# Patient Record
Sex: Male | Born: 1985 | Race: White | Hispanic: No | Marital: Single | Smoking: Current every day smoker
Health system: Southern US, Community
[De-identification: ages and names within clinical notes are randomized; demographics above are authoritative.]

## PROBLEM LIST (undated history)

## (undated) DIAGNOSIS — G629 Polyneuropathy, unspecified: Secondary | ICD-10-CM

## (undated) DIAGNOSIS — K219 Gastro-esophageal reflux disease without esophagitis: Secondary | ICD-10-CM

## (undated) DIAGNOSIS — C801 Malignant (primary) neoplasm, unspecified: Secondary | ICD-10-CM

## (undated) DIAGNOSIS — F419 Anxiety disorder, unspecified: Secondary | ICD-10-CM

## (undated) DIAGNOSIS — F319 Bipolar disorder, unspecified: Secondary | ICD-10-CM

## (undated) HISTORY — PX: WISDOM TOOTH EXTRACTION: SHX21

---

## 2020-08-15 ENCOUNTER — Encounter (HOSPITAL_COMMUNITY): Payer: Self-pay | Admitting: Emergency Medicine

## 2020-08-15 ENCOUNTER — Emergency Department (HOSPITAL_COMMUNITY)
Admission: EM | Admit: 2020-08-15 | Discharge: 2020-08-15 | Discharge status: Against Medical Advice | Payer: Self-pay | Attending: Emergency Medicine | Admitting: Emergency Medicine

## 2020-08-15 ENCOUNTER — Emergency Department (HOSPITAL_COMMUNITY): Payer: Self-pay

## 2020-08-15 ENCOUNTER — Other Ambulatory Visit: Payer: Self-pay

## 2020-08-15 DIAGNOSIS — J982 Interstitial emphysema: Secondary | ICD-10-CM | POA: Insufficient documentation

## 2020-08-15 LAB — CBC WITH DIFFERENTIAL/PLATELET
Abs Immature Granulocytes: 0.04 10*3/uL (ref 0.00–0.07)
Basophils Absolute: 0 10*3/uL (ref 0.0–0.1)
Basophils Relative: 0 %
Eosinophils Absolute: 0 10*3/uL (ref 0.0–0.5)
Eosinophils Relative: 0 %
HCT: 39 % (ref 39.0–52.0)
Hemoglobin: 13.2 g/dL (ref 13.0–17.0)
Immature Granulocytes: 0 %
Lymphocytes Relative: 17 %
Lymphs Abs: 1.8 10*3/uL (ref 0.7–4.0)
MCH: 30.2 pg (ref 26.0–34.0)
MCHC: 33.8 g/dL (ref 30.0–36.0)
MCV: 89.2 fL (ref 80.0–100.0)
Monocytes Absolute: 0.5 10*3/uL (ref 0.1–1.0)
Monocytes Relative: 5 %
Neutro Abs: 8.3 10*3/uL — ABNORMAL HIGH (ref 1.7–7.7)
Neutrophils Relative %: 78 %
Platelets: 255 10*3/uL (ref 150–400)
RBC: 4.37 MIL/uL (ref 4.22–5.81)
RDW: 12 % (ref 11.5–15.5)
WBC: 10.7 10*3/uL — ABNORMAL HIGH (ref 4.0–10.5)
nRBC: 0 % (ref 0.0–0.2)

## 2020-08-15 LAB — BASIC METABOLIC PANEL
Anion gap: 12 (ref 5–15)
BUN: 10 mg/dL (ref 6–20)
CO2: 23 mmol/L (ref 22–32)
Calcium: 9.7 mg/dL (ref 8.9–10.3)
Chloride: 102 mmol/L (ref 98–111)
Creatinine, Ser: 1 mg/dL (ref 0.61–1.24)
GFR, Estimated: >60 mL/min (ref >60)
Glucose, Bld: 111 mg/dL — ABNORMAL HIGH (ref 70–99)
Potassium: 3.4 mmol/L — ABNORMAL LOW (ref 3.5–5.1)
Sodium: 137 mmol/L (ref 135–145)

## 2020-08-15 LAB — D-DIMER, QUANTITATIVE: D-Dimer, Quant: 0.75 ug/mL-FEU — ABNORMAL HIGH (ref 0.00–0.50)

## 2020-08-15 LAB — TROPONIN I (HIGH SENSITIVITY)
Troponin I (High Sensitivity): 4 ng/L (ref <18)
Troponin I (High Sensitivity): 4 ng/L (ref <18)

## 2020-08-15 MED ORDER — ACETAMINOPHEN 325 MG PO TABS
650.0000 mg | ORAL_TABLET | Freq: Once | ORAL | Status: AC
  Administered 2020-08-15: 650 mg via ORAL
  Filled 2020-08-15: qty 2

## 2020-08-15 MED ORDER — METHOCARBAMOL 500 MG PO TABS
500.0000 mg | ORAL_TABLET | Freq: Once | ORAL | Status: AC
  Administered 2020-08-15: 500 mg via ORAL
  Filled 2020-08-15: qty 1

## 2020-08-15 MED ORDER — ALBUTEROL SULFATE HFA 108 (90 BASE) MCG/ACT IN AERS
1.0000 | INHALATION_SPRAY | Freq: Once | RESPIRATORY_TRACT | Status: AC
  Administered 2020-08-15: 1 via RESPIRATORY_TRACT
  Filled 2020-08-15: qty 6.7

## 2020-08-15 MED ORDER — IOHEXOL 350 MG/ML SOLN
75.0000 mL | Freq: Once | INTRAVENOUS | Status: AC | PRN
  Administered 2020-08-15: 75 mL via INTRAVENOUS

## 2020-08-15 NOTE — ED Notes (Signed)
ED Provider at bedside. 

## 2020-08-15 NOTE — ED Provider Notes (Signed)
[facility] EMERGENCY DEPARTMENT Provider Note   CSN: [id number] Arrival date & time: 08/15/20  1235     History Chief Complaint  Patient presents with   Chest Pain    Jeremiah Allen is a 35 y.o. male presenting to the ED with a chief complaint of chest pain.  He has had intermittent chest pain for 1 week that got worse today.  Reports pleuritic chest pain and shortness of breath as well.  Feels that he cannot take in a deep breath fully.  The shortness of breath began today and the chest pain got worse as well.  He also states that the chest pain is worse with movement.  Denies any abdominal pain, nausea, vomiting, fever, leg swelling.  Went on a long drive this past weekend.  Denies any history of DVT, PE, MI, recent immobilization.  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  Last time he used this was in June 2022.  HPI     History reviewed. No pertinent past medical history.  There are no problems to display for this patient.        No family history on file.     Home Medications Prior to Admission medications   Not on File    Allergies    Patient has no known allergies.  Review of Systems   Review of Systems  Constitutional:  Negative for appetite change, chills and fever.  HENT:  Negative for ear pain, rhinorrhea, sneezing and sore throat.   Eyes:  Negative for photophobia and visual disturbance.  Respiratory:  Positive for shortness of breath. Negative for cough, chest tightness and wheezing.   Cardiovascular:  Positive for chest pain. Negative for palpitations.  Gastrointestinal:  Negative for abdominal pain, blood in stool, constipation, diarrhea, nausea and vomiting.  Genitourinary:  Negative for dysuria, hematuria and urgency.  Musculoskeletal:  Negative for myalgias.  Skin:  Negative for rash.  Neurological:  Negative for dizziness, weakness and light-headedness.   Physical Exam Updated Vital Signs BP 132/76    Pulse 73   Temp 98.8 F (37.1 C) (Oral)   Resp 15   Ht 5\' 10"  (1.778 m)   Wt 65.8 kg   SpO2 99%   BMI 20.81 kg/m   Physical Exam Vitals and nursing note reviewed.  Constitutional:      General: He is not in acute distress.    Appearance: He is well-developed.  HENT:     Head: Normocephalic and atraumatic.     Nose: Nose normal.  Eyes:     General: No scleral icterus.       Left eye: No discharge.     Conjunctiva/sclera: Conjunctivae normal.  Cardiovascular:     Rate and Rhythm: Normal rate and regular rhythm.     Heart sounds: Normal heart sounds. No murmur heard.   No friction rub. No gallop.  Pulmonary:     Effort: Pulmonary effort is normal. No respiratory distress.     Breath sounds: Normal breath sounds.  Abdominal:     General: Bowel sounds are normal. There is no distension.     Palpations: Abdomen is soft.     Tenderness: There is no abdominal tenderness. There is no guarding.  Musculoskeletal:        General: Normal range of motion.     Cervical back: Normal range of motion and neck supple.     Comments: No lower extremity edema, erythema or calf tenderness bilaterally.  Skin:  General: Skin is warm and dry.     Findings: No rash.  Neurological:     Mental Status: He is alert.     Motor: No abnormal muscle tone.     Coordination: Coordination normal.    ED Results / Procedures / Treatments   Labs (all labs ordered are listed, but only abnormal results are displayed) Labs Reviewed  CBC WITH DIFFERENTIAL/PLATELET - Abnormal; Notable for the following components:      Result Value   WBC 10.7 (*)    Neutro Abs 8.3 (*)    All other components within normal limits  BASIC METABOLIC PANEL - Abnormal; Notable for the following components:   Potassium 3.4 (*)    Glucose, Bld 111 (*)    All other components within normal limits  D-DIMER, QUANTITATIVE - Abnormal; Notable for the following components:   D-Dimer, Quant 0.75 (*)    All other components within  normal limits  RESP PANEL BY RT-PCR (FLU A&B, COVID) ARPGX2  TROPONIN I (HIGH SENSITIVITY)  TROPONIN I (HIGH SENSITIVITY)    EKG EKG Interpretation  Date/Time:  Thursday August 15 2020 12:38:54 EDT Ventricular Rate:  86 PR Interval:  168 QRS Duration: 100 QT Interval:  370 QTC Calculation: 442 R Axis:   67 Text Interpretation: Normal sinus rhythm Normal ECG Confirmed by Madalyn Rob [phone]) on 08/15/2020 4:43:28 PM  Radiology DG Chest 2 View  Result Date: 08/15/2020 CLINICAL DATA:  Short of breath and chest tightness EXAM: CHEST - 2 VIEW COMPARISON:  None. FINDINGS: The heart size and mediastinal contours are within normal limits. Both lungs are clear. The visualized skeletal structures are unremarkable. IMPRESSION: No active cardiopulmonary disease. Electronically Signed   By: Franchot Gallo M.D.   On: 08/15/2020 13:33   CT Angio Chest PE W/Cm &/Or Wo Cm  Result Date: 08/15/2020 CLINICAL DATA:  Two weeks of shortness of breath and chest pain with acute worsening today. EXAM: CT ANGIOGRAPHY CHEST WITH CONTRAST TECHNIQUE: Multidetector CT imaging of the chest was performed using the standard protocol during bolus administration of intravenous contrast. Multiplanar CT image reconstructions and MIPs were obtained to evaluate the vascular anatomy. CONTRAST:  78mL OMNIPAQUE IOHEXOL 350 MG/ML SOLN COMPARISON:  Same day chest radiograph FINDINGS: Cardiovascular: Satisfactory opacification of the pulmonary arteries to the segmental level. No evidence of pulmonary embolism. Normal caliber aorta. Normal heart size. No pericardial effusion. No suspicious intracardiac filling defect. Mediastinum/Nodes: Extensive pneumomediastinum. No mediastinal free fluid. No evidence of esophageal wall thickening or findings to suggest acute inflammation. Trachea appears grossly unremarkable. No pathologically enlarged mediastinal, hilar or axillary lymph nodes. No discrete thyroid nodule. 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 No focal consolidation. No pleural effusion. No pneumothorax. Upper Abdomen: No acute abnormality. Musculoskeletal: No chest wall abnormality. No acute or significant osseous findings. Review of the MIP images confirms the above findings. IMPRESSION: 1. 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 No mediastinal free fluid/fluid collections, or evidence of esophageal wall thickening/inflammation. Constellation of findings consistent with spontaneous pneumomediastinum related to barotrauma. 2. No evidence of pulmonary embolism. Electronically Signed   By: Dahlia Bailiff MD   On: 08/15/2020 19:32    Procedures .Critical Care  Date/Time: 08/15/2020 8:00 PM Performed by: Delia Heady, PA-C Authorized by: Delia Heady, PA-C   Critical care provider statement:    Critical care time (minutes):  35   Critical care time was exclusive of:  Separately billable  procedures and treating other patients and teaching time   Critical care was necessary to treat or prevent imminent or life-threatening deterioration of the following conditions:  Respiratory failure, cardiac failure and circulatory failure   Critical care was time spent personally by me on the following activities:  Development of treatment plan with patient or surrogate, discussions with consultants, evaluation of patient's response to treatment, examination of patient, obtaining history from patient or surrogate, ordering and performing treatments and interventions, ordering and review of laboratory studies, ordering and review of radiographic studies, pulse oximetry, re-evaluation of patient's condition and review of old charts   I assumed direction of critical care for this patient from another provider in my specialty: no     Care discussed with: admitting provider     Medications Ordered in ED Medications   acetaminophen (TYLENOL) tablet 650 mg (650 mg Oral Given 08/15/20 1654)  methocarbamol (ROBAXIN) tablet 500 mg (500 mg Oral Given 08/15/20 1654)  albuterol (VENTOLIN HFA) 108 (90 Base) MCG/ACT inhaler 1 puff (1 puff Inhalation Given 08/15/20 1654)  iohexol (OMNIPAQUE) 350 MG/ML injection 75 mL (75 mLs Intravenous Contrast Given 08/15/20 1820)    ED Course  I have reviewed the triage vital signs and the nursing notes.  Pertinent labs & imaging results that were available during my care of the patient were reviewed by me and considered in my medical decision making (see chart for details).  Clinical Course as of 08/15/20 2024  Thu Aug 15, 2020  1956 D-Dimer, Quant(!): 0.75 [HK]  1956 Troponin I (High Sensitivity): 4 [HK]    Clinical Course User Index [HK] Delia Heady, PA-C   MDM Rules/Calculators/A&P                          35 year old male presenting to the ED for chest pain for the past week.  Symptoms got worse today and started having pleuritic pain and shortness of breath.  Feels like he cannot take in a deep breath fully.  He denies any abdominal pain, vomiting.  Had a recent long car ride.  Denies any hemoptysis.  No history of DVT or PE, MI or exogenous hormone use.  On exam patient without any respiratory distress.  Lungs are clear to auscultation bilaterally.  No lower extremity edema, erythema Noted bilaterally.  EKG shows normal sinus rhythm.  CBC, BMP unremarkable.  Initial troponin is negative.  Chest x-ray without any acute findings.  Will attempt to treat symptoms and reassess after delta troponin and D-dimer done due to his pleuritic pain, shortness of breath and recent long car ride.  Delta troponin is negative.  D-dimer slightly elevated at 0.75.  CT angio of the chest was done to rule out PE.  No evidence of PE but does show extensive pneumomediastinum likely from barotrauma. Spoke to on-call critical care provider.  Recommends no intervention is necessary unless this  develops into a pneumothorax.  He does recommend observation to evaluate for worsening symptoms.  Will admit to medicine service for observation.  8:24 PM Patient states that he would like to leave AMA. I have discussed my concerns as a provider and the possibility that this may worsen. We discussed the nature, risks and benefits, and alternatives to treatment. I have specifically discussed that without further evaluation I cannot guarantee there is not a life threatening event occuring.  Time was given to allow the opportunity to ask questions and consider the options, and after the discussion, the  patient decided to refuse the offered treatment. Patient is alert and oriented x4, their own POA and states understanding of my concerns and the possible consequences. After refusal, I made every reasonable opportunity to treat them to the best of my ability. I have made the patient aware that this is an [facility] discharge, but he may return at any time for further evaluation and treatment.    Portions of this note were generated with [employer]. Dictation errors may occur despite best attempts at proofreading.   Final Clinical Impression(s) / ED Diagnoses Final diagnoses:  Pneumomediastinum [facility])    Rx / DC Orders ED Discharge Orders          Ordered    Ambulatory referral to Pulmonology        08/15/20 2023             Delia Heady, Hershal Coria 08/15/20 2024    Lucrezia Starch, MD 08/16/20 1155

## 2020-08-15 NOTE — ED Triage Notes (Signed)
Patient here for evaluation of shortness of breath and chest pain that started two days ago but got significantly worse at approximately 1130 today. Shortness of breath is exacerbated by speaking, SpO2 100% on room air. Patient alert, oriented, and in no apparent distress at this time.

## 2020-08-15 NOTE — ED Notes (Addendum)
Patient transported to CT  8:16 PM Pt wants to leave AMA. PA and Dr. Roslynn Amble aware. Will not do/send covid swab because it's for admission.

## 2020-08-15 NOTE — ED Provider Notes (Signed)
Emergency Medicine Provider Triage Evaluation Note  Jeremiah Allen , a 35 y.o. male  was evaluated in triage.  Pt complains of central/left-sided, non-radiating, constant chest pain associated with shortness of breath. No diaphoresis, nausea, or vomiting. Described chest pain as something sitting on his chest. No cardiac history. Shortness of breath worse when speaking. He notes he feels like his throat is closing up. No known allergies. No new medications or products. No rash, abdominal pain, nausea, vomiting, wheeze, or stridor. Denies history of blood clots. No lower extremity edema. Denies fever and chills. Used IV meth early this week and in June. No fever or chills. He also endorses bilateral hand tingling. He admits to a history of anxiety and notes he is feeling very anxious right now.  Review of Systems  Positive: CP, SOB, numbness/tingling Negative: fever  Physical Exam  BP 130/74   Pulse 96   Temp 98.8 F (37.1 C) (Oral)   Resp 20   SpO2 100%  Gen:   Awake, no distress   Resp:  Normal effort  MSK:   Moves extremities without difficulty  Other:  Equal radial pulses. No stridor or wheeze. Airway patent. Lungs clear to auscultation bilaterally  Medical Decision Making  Medically screening exam initiated at 1:01 PM.  Appropriate orders placed.  Jeremiah Allen was informed that the remainder of the evaluation will be completed by another provider, this initial triage assessment does not replace that evaluation, and the importance of remaining in the ED until their evaluation is complete.  Cardiac labs to rule out cardiac etiology. CXR to rule out PTX or other etiologies of SOB. Vitals WNL with no hypoxia during evaluation.    Suzy Bouchard, PA-C 08/15/20 1305    Lorelle Gibbs, DO 08/15/20 1343

## 2020-08-15 NOTE — Discharge Instructions (Addendum)
Your imaging shows that you have a pneumomediastinum.  Please read the attached information regarding your condition. You are choosing to leave [facility] today.  You can return at any time to seek treatment whether your condition worsens or remains the same. I have referred you to a pulmonologist. Return to the ER if you start to experience worsening pain, shortness of breath, loss of consciousness.

## 2020-08-16 ENCOUNTER — Encounter (HOSPITAL_COMMUNITY): Payer: Self-pay | Admitting: Emergency Medicine

## 2020-08-16 ENCOUNTER — Emergency Department (HOSPITAL_COMMUNITY): Payer: Self-pay

## 2020-08-16 ENCOUNTER — Other Ambulatory Visit: Payer: Self-pay

## 2020-08-16 ENCOUNTER — Emergency Department (HOSPITAL_COMMUNITY)
Admission: EM | Admit: 2020-08-16 | Discharge: 2020-08-17 | Disposition: A | Discharge status: Home / Self Care | Payer: Self-pay | Attending: Emergency Medicine | Admitting: Emergency Medicine

## 2020-08-16 DIAGNOSIS — F17210 Nicotine dependence, cigarettes, uncomplicated: Secondary | ICD-10-CM | POA: Insufficient documentation

## 2020-08-16 DIAGNOSIS — R42 Dizziness and giddiness: Secondary | ICD-10-CM | POA: Insufficient documentation

## 2020-08-16 DIAGNOSIS — R0602 Shortness of breath: Secondary | ICD-10-CM | POA: Insufficient documentation

## 2020-08-16 DIAGNOSIS — F419 Anxiety disorder, unspecified: Secondary | ICD-10-CM | POA: Insufficient documentation

## 2020-08-16 LAB — COMPREHENSIVE METABOLIC PANEL WITH GFR
ALT: 13 U/L (ref 0–44)
AST: 19 U/L (ref 15–41)
Albumin: 4.1 g/dL (ref 3.5–5.0)
Alkaline Phosphatase: 73 U/L (ref 38–126)
Anion gap: 7 (ref 5–15)
BUN: 7 mg/dL (ref 6–20)
CO2: 25 mmol/L (ref 22–32)
Calcium: 9.2 mg/dL (ref 8.9–10.3)
Chloride: 103 mmol/L (ref 98–111)
Creatinine, Ser: 1.01 mg/dL (ref 0.61–1.24)
GFR, Estimated: >60 mL/min
Glucose, Bld: 108 mg/dL — ABNORMAL HIGH (ref 70–99)
Potassium: 3.7 mmol/L (ref 3.5–5.1)
Sodium: 135 mmol/L (ref 135–145)
Total Bilirubin: 0.6 mg/dL (ref 0.3–1.2)
Total Protein: 7.2 g/dL (ref 6.5–8.1)

## 2020-08-16 LAB — CBC WITH DIFFERENTIAL/PLATELET
Abs Immature Granulocytes: 0.02 10*3/uL (ref 0.00–0.07)
Basophils Absolute: 0 10*3/uL (ref 0.0–0.1)
Basophils Relative: 0 %
Eosinophils Absolute: 0.2 10*3/uL (ref 0.0–0.5)
Eosinophils Relative: 2 %
HCT: 38.9 % — ABNORMAL LOW (ref 39.0–52.0)
Hemoglobin: 13.2 g/dL (ref 13.0–17.0)
Immature Granulocytes: 0 %
Lymphocytes Relative: 15 %
Lymphs Abs: 1.1 10*3/uL (ref 0.7–4.0)
MCH: 30.9 pg (ref 26.0–34.0)
MCHC: 33.9 g/dL (ref 30.0–36.0)
MCV: 91.1 fL (ref 80.0–100.0)
Monocytes Absolute: 0.5 10*3/uL (ref 0.1–1.0)
Monocytes Relative: 7 %
Neutro Abs: 5.5 10*3/uL (ref 1.7–7.7)
Neutrophils Relative %: 76 %
Platelets: 234 10*3/uL (ref 150–400)
RBC: 4.27 MIL/uL (ref 4.22–5.81)
RDW: 12.2 % (ref 11.5–15.5)
WBC: 7.4 10*3/uL (ref 4.0–10.5)
nRBC: 0 % (ref 0.0–0.2)

## 2020-08-16 NOTE — ED Triage Notes (Signed)
Patient arrived with EMS from home reports SOB /hyperventilating this evening with anxiety , denies cough or fever . No chest pain .

## 2020-08-17 ENCOUNTER — Emergency Department (HOSPITAL_COMMUNITY): Payer: Self-pay

## 2020-08-17 MED ORDER — HYDROXYZINE HCL 25 MG PO TABS: 25.0000 mg | ORAL_TABLET | Freq: Four times a day (QID) | ORAL | 0 refills | Status: DC | PRN
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
## 2021-04-16 ENCOUNTER — Encounter
Admission: EM | Disposition: A | Discharge status: Home / Self Care | Payer: Self-pay | Source: Home / Self Care | Attending: Internal Medicine

## 2021-04-16 ENCOUNTER — Other Ambulatory Visit: Payer: Self-pay

## 2021-04-16 ENCOUNTER — Inpatient Hospital Stay: Payer: Self-pay | Admitting: Certified Registered"

## 2021-04-16 ENCOUNTER — Emergency Department: Payer: Self-pay

## 2021-04-16 ENCOUNTER — Inpatient Hospital Stay
Admission: EM | Admit: 2021-04-16 | Discharge: 2021-04-18 | DRG: 375 | Disposition: A | Discharge status: Home / Self Care | Payer: Self-pay | Attending: Internal Medicine | Admitting: Internal Medicine

## 2021-04-16 ENCOUNTER — Encounter: Payer: Self-pay | Admitting: Emergency Medicine

## 2021-04-16 DIAGNOSIS — F319 Bipolar disorder, unspecified: Secondary | ICD-10-CM

## 2021-04-16 DIAGNOSIS — C20 Malignant neoplasm of rectum: Principal | ICD-10-CM | POA: Diagnosis present

## 2021-04-16 DIAGNOSIS — R159 Full incontinence of feces: Secondary | ICD-10-CM | POA: Diagnosis present

## 2021-04-16 DIAGNOSIS — M5430 Sciatica, unspecified side: Secondary | ICD-10-CM | POA: Diagnosis present

## 2021-04-16 DIAGNOSIS — Z79899 Other long term (current) drug therapy: Secondary | ICD-10-CM

## 2021-04-16 DIAGNOSIS — T402X5A Adverse effect of other opioids, initial encounter: Secondary | ICD-10-CM | POA: Diagnosis not present

## 2021-04-16 DIAGNOSIS — K922 Gastrointestinal hemorrhage, unspecified: Secondary | ICD-10-CM

## 2021-04-16 DIAGNOSIS — C787 Secondary malignant neoplasm of liver and intrahepatic bile duct: Secondary | ICD-10-CM | POA: Diagnosis present

## 2021-04-16 DIAGNOSIS — R1084 Generalized abdominal pain: Principal | ICD-10-CM

## 2021-04-16 DIAGNOSIS — L299 Pruritus, unspecified: Secondary | ICD-10-CM | POA: Diagnosis not present

## 2021-04-16 DIAGNOSIS — Z8249 Family history of ischemic heart disease and other diseases of the circulatory system: Secondary | ICD-10-CM

## 2021-04-16 DIAGNOSIS — K5900 Constipation, unspecified: Secondary | ICD-10-CM | POA: Diagnosis present

## 2021-04-16 DIAGNOSIS — R591 Generalized enlarged lymph nodes: Secondary | ICD-10-CM | POA: Diagnosis present

## 2021-04-16 DIAGNOSIS — Z20822 Contact with and (suspected) exposure to covid-19: Secondary | ICD-10-CM | POA: Diagnosis present

## 2021-04-16 DIAGNOSIS — F17200 Nicotine dependence, unspecified, uncomplicated: Secondary | ICD-10-CM | POA: Diagnosis present

## 2021-04-16 DIAGNOSIS — C19 Malignant neoplasm of rectosigmoid junction: Secondary | ICD-10-CM

## 2021-04-16 DIAGNOSIS — K6289 Other specified diseases of anus and rectum: Secondary | ICD-10-CM

## 2021-04-16 DIAGNOSIS — K625 Hemorrhage of anus and rectum: Secondary | ICD-10-CM | POA: Diagnosis present

## 2021-04-16 DIAGNOSIS — F17210 Nicotine dependence, cigarettes, uncomplicated: Secondary | ICD-10-CM | POA: Diagnosis present

## 2021-04-16 HISTORY — PX: FLEXIBLE SIGMOIDOSCOPY: SHX5431

## 2021-04-16 HISTORY — DX: Bipolar disorder, unspecified: F31.9

## 2021-04-16 LAB — CBC WITH DIFFERENTIAL/PLATELET
Abs Immature Granulocytes: 0.03 10*3/uL (ref 0.00–0.07)
Basophils Absolute: 0 10*3/uL (ref 0.0–0.1)
Basophils Relative: 1 %
Eosinophils Absolute: 0.2 10*3/uL (ref 0.0–0.5)
Eosinophils Relative: 3 %
HCT: 38.5 % — ABNORMAL LOW (ref 39.0–52.0)
Hemoglobin: 12.5 g/dL — ABNORMAL LOW (ref 13.0–17.0)
Immature Granulocytes: 1 %
Lymphocytes Relative: 26 %
Lymphs Abs: 1.5 10*3/uL (ref 0.7–4.0)
MCH: 29.5 pg (ref 26.0–34.0)
MCHC: 32.5 g/dL (ref 30.0–36.0)
MCV: 90.8 fL (ref 80.0–100.0)
Monocytes Absolute: 0.5 10*3/uL (ref 0.1–1.0)
Monocytes Relative: 9 %
Neutro Abs: 3.4 10*3/uL (ref 1.7–7.7)
Neutrophils Relative %: 60 %
Platelets: 284 10*3/uL (ref 150–400)
RBC: 4.24 MIL/uL (ref 4.22–5.81)
RDW: 11.9 % (ref 11.5–15.5)
Smear Review: NORMAL
WBC Morphology: INCREASED
WBC: 5.6 10*3/uL (ref 4.0–10.5)
nRBC: 0 % (ref 0.0–0.2)

## 2021-04-16 LAB — RESP PANEL BY RT-PCR (FLU A&B, COVID) ARPGX2
Influenza A by PCR: NEGATIVE
Influenza B by PCR: NEGATIVE
SARS Coronavirus 2 by RT PCR: NEGATIVE

## 2021-04-16 LAB — PROTIME-INR
INR: 1.1 (ref 0.8–1.2)
Prothrombin Time: 14 seconds (ref 11.4–15.2)

## 2021-04-16 LAB — URINALYSIS, ROUTINE W REFLEX MICROSCOPIC
Bilirubin Urine: NEGATIVE
Glucose, UA: NEGATIVE mg/dL
Hgb urine dipstick: NEGATIVE
Ketones, ur: NEGATIVE mg/dL
Leukocytes,Ua: NEGATIVE
Nitrite: NEGATIVE
Protein, ur: NEGATIVE mg/dL
Specific Gravity, Urine: 1.042 — ABNORMAL HIGH (ref 1.005–1.030)
pH: 8 (ref 5.0–8.0)

## 2021-04-16 LAB — COMPREHENSIVE METABOLIC PANEL
ALT: 14 U/L (ref 0–44)
AST: 19 U/L (ref 15–41)
Albumin: 4 g/dL (ref 3.5–5.0)
Alkaline Phosphatase: 72 U/L (ref 38–126)
Anion gap: 9 (ref 5–15)
BUN: 7 mg/dL (ref 6–20)
CO2: 25 mmol/L (ref 22–32)
Calcium: 9.1 mg/dL (ref 8.9–10.3)
Chloride: 104 mmol/L (ref 98–111)
Creatinine, Ser: 0.81 mg/dL (ref 0.61–1.24)
GFR, Estimated: >60 mL/min (ref >60)
Glucose, Bld: 93 mg/dL (ref 70–99)
Potassium: 4 mmol/L (ref 3.5–5.1)
Sodium: 138 mmol/L (ref 135–145)
Total Bilirubin: 0.5 mg/dL (ref 0.3–1.2)
Total Protein: 7.7 g/dL (ref 6.5–8.1)

## 2021-04-16 LAB — TYPE AND SCREEN
ABO/RH(D): O POS
Antibody Screen: NEGATIVE

## 2021-04-16 LAB — LIPASE, BLOOD: Lipase: 23 U/L (ref 11–51)

## 2021-04-16 SURGERY — SIGMOIDOSCOPY, FLEXIBLE
Anesthesia: General

## 2021-04-16 MED ORDER — HYDROXYZINE HCL 10 MG PO TABS
10.0000 mg | ORAL_TABLET | Freq: Three times a day (TID) | ORAL | Status: DC | PRN
  Administered 2021-04-18: 10 mg via ORAL
  Filled 2021-04-16 (×3): qty 1

## 2021-04-16 MED ORDER — MORPHINE SULFATE (PF) 2 MG/ML IV SOLN
2.0000 mg | INTRAVENOUS | Status: DC | PRN
  Administered 2021-04-16 – 2021-04-18 (×5): 2 mg via INTRAVENOUS
  Filled 2021-04-16 (×5): qty 1

## 2021-04-16 MED ORDER — IOHEXOL 350 MG/ML SOLN
100.0000 mL | Freq: Once | INTRAVENOUS | Status: AC | PRN
  Administered 2021-04-16: 100 mL via INTRAVENOUS

## 2021-04-16 MED ORDER — MORPHINE SULFATE (PF) 2 MG/ML IV SOLN
2.0000 mg | INTRAVENOUS | Status: DC | PRN
  Administered 2021-04-16: 2 mg via INTRAVENOUS
  Filled 2021-04-16: qty 1

## 2021-04-16 MED ORDER — MIDAZOLAM HCL 2 MG/2ML IJ SOLN
INTRAMUSCULAR | Status: DC | PRN
  Administered 2021-04-16: 2 mg via INTRAVENOUS

## 2021-04-16 MED ORDER — SODIUM CHLORIDE 0.9 % IV SOLN: INTRAVENOUS | Status: DC

## 2021-04-16 MED ORDER — ARIPIPRAZOLE 2 MG PO TABS
2.0000 mg | ORAL_TABLET | Freq: Every day | ORAL | Status: DC
  Administered 2021-04-18: 2 mg via ORAL
  Filled 2021-04-16 (×3): qty 1

## 2021-04-16 MED ORDER — PROPOFOL 10 MG/ML IV BOLUS
INTRAVENOUS | Status: DC | PRN
  Administered 2021-04-16: 60 mg via INTRAVENOUS
  Administered 2021-04-16 (×2): 20 mg via INTRAVENOUS

## 2021-04-16 MED ORDER — PROPOFOL 500 MG/50ML IV EMUL
INTRAVENOUS | Status: DC | PRN
  Administered 2021-04-16: 155 ug/kg/min via INTRAVENOUS

## 2021-04-16 MED ORDER — SODIUM CHLORIDE 0.9 % IV BOLUS (SEPSIS)
1000.0000 mL | Freq: Once | INTRAVENOUS | Status: AC
  Administered 2021-04-16: 1000 mL via INTRAVENOUS

## 2021-04-16 MED ORDER — ONDANSETRON HCL 4 MG/2ML IJ SOLN: 4.0000 mg | Freq: Four times a day (QID) | INTRAMUSCULAR | Status: DC | PRN

## 2021-04-16 MED ORDER — GABAPENTIN 300 MG PO CAPS
300.0000 mg | ORAL_CAPSULE | Freq: Two times a day (BID) | ORAL | Status: DC
  Administered 2021-04-16 – 2021-04-18 (×3): 300 mg via ORAL
  Filled 2021-04-16 (×3): qty 1

## 2021-04-16 MED ORDER — HYDROMORPHONE HCL 1 MG/ML IJ SOLN
1.0000 mg | INTRAMUSCULAR | Status: DC | PRN
  Administered 2021-04-16: 1 mg via INTRAVENOUS
  Filled 2021-04-16 (×2): qty 1

## 2021-04-16 MED ORDER — LIDOCAINE HCL (CARDIAC) PF 100 MG/5ML IV SOSY
PREFILLED_SYRINGE | INTRAVENOUS | Status: DC | PRN
  Administered 2021-04-16: 100 mg via INTRAVENOUS

## 2021-04-16 MED ORDER — ONDANSETRON HCL 4 MG PO TABS
4.0000 mg | ORAL_TABLET | Freq: Four times a day (QID) | ORAL | Status: DC | PRN
Start: 2021-04-16 — End: 2021-04-18

## 2021-04-16 MED ORDER — MIDAZOLAM HCL 2 MG/2ML IJ SOLN
INTRAMUSCULAR | Status: AC
Start: 2021-04-16 — End: ?
  Filled 2021-04-16: qty 2

## 2021-04-16 MED ORDER — FENTANYL CITRATE PF 50 MCG/ML IJ SOSY
50.0000 ug | PREFILLED_SYRINGE | Freq: Once | INTRAMUSCULAR | Status: AC
  Administered 2021-04-16: 50 ug via INTRAVENOUS
  Filled 2021-04-16: qty 1

## 2021-04-16 MED ORDER — INFLUENZA VAC SPLIT QUAD 0.5 ML IM SUSY: 0.5000 mL | PREFILLED_SYRINGE | INTRAMUSCULAR | Status: DC

## 2021-04-16 MED ORDER — ONDANSETRON HCL 4 MG/2ML IJ SOLN
4.0000 mg | Freq: Once | INTRAMUSCULAR | Status: AC
  Administered 2021-04-16: 4 mg via INTRAVENOUS
  Filled 2021-04-16: qty 2

## 2021-04-16 MED ORDER — DIPHENHYDRAMINE HCL 25 MG PO CAPS
25.0000 mg | ORAL_CAPSULE | Freq: Once | ORAL | Status: AC
  Administered 2021-04-16: 25 mg via ORAL
  Filled 2021-04-16: qty 1

## 2021-04-16 NOTE — Progress Notes (Signed)
Admission profile updated. ?

## 2021-04-16 NOTE — Transfer of Care (Signed)
Immediate Anesthesia Transfer of Care Note ? ?Patient: Jeremiah Allen ? ?Procedure(s) Performed: FLEXIBLE SIGMOIDOSCOPY ? ?Patient Location: Endoscopy Unit ? ?Anesthesia Type:General ? ?Level of Consciousness: awake, drowsy and patient cooperative ? ?Airway & Oxygen Therapy: Patient Spontanous Breathing and Patient connected to face mask oxygen ? ?Post-op Assessment: Report given to RN and Post -op Vital signs reviewed and stable ? ?Post vital signs: Reviewed and stable ? ?Last Vitals:  ?Vitals Value Taken Time  ?BP 100/56 04/16/21 1617  ?Temp    ?Pulse 67 04/16/21 1618  ?Resp 20 04/16/21 1618  ?SpO2 100 % 04/16/21 1618  ?Vitals shown include unvalidated device data. ? ?Last Pain:  ?Vitals:  ? 04/16/21 1541  ?TempSrc: Temporal  ?PainSc: 5   ?   ? ?  ? ?Complications: No notable events documented. ?

## 2021-04-16 NOTE — Assessment & Plan Note (Addendum)
Appreciate GI performing flexible sigmoidoscopy yesterday showing 4 cm rectal mass worrisome for malignancy.  Biopsy results pending ?I have requested oncology consult.  Dr. Rogue Bussing seen and getting further work-up including MRI of the pelvis, liver and blood work ?Cancer center is coordinating outpatient management at discharge ?

## 2021-04-16 NOTE — H&P (Addendum)
?History and Physical  ? ? ?Patient: Jeremiah Allen HMC:[id number] DOB: [birth year] ?DOA: 04/16/2021 ?DOS: the patient was seen and examined on 04/16/2021 ?PCP: System, Provider Not In  ?Patient coming from: Home ? ?Chief Complaint:  ?Chief Complaint  ?Patient presents with  ? Abdominal Pain  ? ?HPI: Jeremiah Allen is a 36 y.o. male with medical history significant for bipolar disorder who presents to the ER for evaluation of 1 and half week history of abdominal pain. ?Patient describes this pain as a sharp, stabbing pain in the right lower quadrant rated a 10 x 10 in intensity at its worst with radiation to the left lower quadrant and associated with pressure sensation in the rectum and urgency.  He also has loose stools mixed with blood.  He denies having any fever or chills.  He denies having a history of IBD or any family history of cancer.  He has taken NSAIDs occasionally for pain control but none recently. ?Patient complains of some discomfort when he voids and describes it as a pressure when he has to void like he has to overcome a blockage.  He denies having any dysuria or frequency. ?He states that he was seen by his primary care provider and told he probably has hemorrhoids. ?He decided to come to the ER due to the persistence of his symptoms. ?No chest pain, shortness of breath, no dizziness, no lightheadedness, no leg swelling, no nausea, no vomiting, no melena stools or hematemesis. ?He will be admitted to the hospital for further evaluation. ? ? ? ?Review of Systems: As mentioned in the history of present illness. All other systems reviewed and are negative. ?History reviewed. No pertinent past medical history. ?History reviewed. No pertinent surgical history. ?Social History:  reports that he has been smoking cigarettes. He does not have any smokeless tobacco history on file. He reports that he does not drink alcohol and does not use drugs. ? ?No Known Allergies ? ?No family history on file. ? ?Prior to  Admission medications   ?Not on File  ? ? ?Physical Exam: ?Vitals:  ? 04/16/21 0527 04/16/21 0535 04/16/21 0600 04/16/21 0656  ?BP:  129/81 112/71 111/63  ?Pulse:  84 61 61  ?Resp:  '18 16 18  '$ ?Temp:  98.4 ?F (36.9 ?C)    ?TempSrc:  Oral    ?SpO2:  100% 100% 100%  ?Weight: 68 kg     ?Height: '5\' 9"'$  (1.753 m)     ? ?Physical Exam ?Vitals and nursing note reviewed.  ?Constitutional:   ?   Appearance: He is normal weight.  ?HENT:  ?   Head: Normocephalic and atraumatic.  ?   Mouth/Throat:  ?   Mouth: Mucous membranes are moist.  ?Eyes:  ?   Extraocular Movements: Extraocular movements intact.  ?   Pupils: Pupils are equal, round, and reactive to light.  ?Cardiovascular:  ?   Rate and Rhythm: Normal rate and regular rhythm.  ?Pulmonary:  ?   Effort: Pulmonary effort is normal.  ?   Breath sounds: Normal breath sounds.  ?Abdominal:  ?   General: Abdomen is flat. Bowel sounds are normal.  ?   Palpations: Abdomen is soft.  ?   Tenderness: There is abdominal tenderness in the right lower quadrant, periumbilical area and suprapubic area.  ?Genitourinary: ?   Rectum: Normal.  ?Skin: ?   General: Skin is warm and dry.  ?Neurological:  ?   General: No focal deficit present.  ?   Mental Status: He  is alert.  ?Psychiatric:     ?   Mood and Affect: Mood normal.     ?   Behavior: Behavior normal.  ? ? ?Data Reviewed: ?Relevant notes from primary care and specialist visits, past discharge summaries as available in EHR, including Care Everywhere. ?Prior diagnostic testing as pertinent to current admission diagnoses ?Updated medications and problem lists for reconciliation ?ED course, including vitals, labs, imaging, treatment and response to treatment ?Triage notes, nursing and pharmacy notes and ED provider's notes ?Notable results as noted in HPI ?Labs reviewed.  Hemoglobin 12.5 compared to baseline of 13.2, hematocrit 38.5.  Electrolytes within normal limits.  PT 14, INR 1.1 ?CT angiogram of abdomen and pelvis shows 1. Bulky and  irregular rectal wall thickening up to 16 mm with ?perirectal lymphadenopathy, and two indeterminate liver lesions. ?While this might be an inflammatory proctitis, cannot exclude Rectal ?Carcinoma with Liver Metastases. Recommend Endoscopy. ?No vascular abnormality on CTA. Other bowel segments appear ?normal. And no other abnormality identified in the abdomen or ?pelvis. ?There are no new results to review at this time. ? ?Assessment and Plan: ?* Rectal bleeding ?Most likely secondary to inflammatory proctitis but will need endoscopy to rule out rectal cancer. ?We will consult GI ?Monitor H&H and transfuse as needed ? ?Bulky and irregular rectal wall thickening up to 16 mm with ?perirectal lymphadenopathy, and two indeterminate liver lesions. ?While this might be an inflammatory proctitis, cannot exclude Rectal ?Carcinoma with Liver Metastases. Recommend Endoscopy. ? ?Sciatica ?Continue gabapentin ? ?Bipolar 1 disorder ([place]) ?Continue Abilify and hydroxyzine ? ?Nicotine dependence ?Smoking cessation discussed with patient in detail ?He declines a nicotine transdermal patch at this time ? ? ? ? ? Advance Care Planning:   Code Status: Not on file full code ? ?Consults: Gastroenterology ? ?Family Communication: Greater than 50% of time was spent discussing patient's condition and plan of care with him at the bedside.  All questions and concerns have been addressed.  He verbalizes understanding and agrees with the plan. ? ?Severity of Illness: ?The appropriate patient status for this patient is INPATIENT. Inpatient status is judged to be reasonable and necessary in order to provide the required intensity of service to ensure the patient's safety. The patient's presenting symptoms, physical exam findings, and initial radiographic and laboratory data in the context of their chronic comorbidities is felt to place them at high risk for further clinical deterioration. Furthermore, it is not anticipated that the patient will  be medically stable for discharge from the hospital within 2 midnights of admission.  ? ?* I certify that at the point of admission it is my clinical judgment that the patient will require inpatient hospital care spanning beyond 2 midnights from the point of admission due to high intensity of service, high risk for further deterioration and high frequency of surveillance required.* ? ?Author: ?Collier Bullock, MD ?04/16/2021 8:53 AM ? ?For on call review [email or web address].  ?

## 2021-04-16 NOTE — Assessment & Plan Note (Addendum)
Smoking cessation discussed with patient in detail He declines a nicotine transdermal patch at this time 

## 2021-04-16 NOTE — Assessment & Plan Note (Addendum)
Continue gabapentin.

## 2021-04-16 NOTE — Assessment & Plan Note (Addendum)
Continue Abilify and hydroxyzine. ?

## 2021-04-16 NOTE — Op Note (Signed)
[facility] ?Gastroenterology ?Patient Name: Jeremiah Allen ?Procedure Date: 04/16/2021 3:45 PM ?MRN: [id number] ?Account #: [id number] ?Date of Birth: March 12, 1985 ?Admit Type: Inpatient ?Age: 36 ?Room: [facility] ENDO ROOM 2 ?Gender: Male ?Note Status: Finalized ?Instrument Name: Upper Endoscope [id number] ?Procedure:             Flexible Sigmoidoscopy ?Indications:           Abdominal pain in the right lower quadrant,  ?                       Hematochezia, Abnormal CT of the GI tract, Weight  ?                       loss, Change in stool caliber, Constipation ?Providers:             Benay Pike. Damarco Keysor MD, MD ?Medicines:             Propofol per Anesthesia ?Complications:         No immediate complications. Estimated blood loss:  ?                       Minimal. ?Procedure:             Pre-Anesthesia Assessment: ?                       - The risks and benefits of the procedure and the  ?                       sedation options and risks were discussed with the  ?                       patient. All questions were answered and informed  ?                       consent was obtained. ?                       - Patient identification and proposed procedure were  ?                       verified prior to the procedure by the nurse. The  ?                       procedure was verified in the procedure room. ?                       - ASA Grade Assessment: I - A normal, healthy patient. ?                       - After reviewing the risks and benefits, the patient  ?                       was deemed in satisfactory condition to undergo the  ?                       procedure. ?                       After obtaining informed consent, the scope was passed  ?  under direct vision. The Endoscope was introduced  ?                       through the anus and advanced to the the rectosigmoid  ?                       junction. The flexible sigmoidoscopy was technically  ?                       difficult and  complex due to poor bowel prep. The  ?                       quality of the bowel preparation was poor. ?Findings: ?     The perianal examination was normal. Procedure was performed UNPREPPED  ?     to obtain diagnosis. ?     The digital rectal exam revealed a firm rectal mass palpated 4.0 cm from  ?     the anal verge. The mass was circumferential. ?     A fungating, infiltrative and ulcerated partially obstructing large mass  ?     was found in the distal rectum. The mass was circumferential. The mass  ?     measured four cm in length. In addition, its diameter measured four mm.  ?     No bleeding was present. Biopsies were taken with a cold forceps for  ?     histology. ?     Normal mucosa was found in the recto-sigmoid colon. ?     Copious quantities of solid stool was found in the recto-sigmoid colon,  ?     precluding visualization. Due to unprepped state of the colon , the  ?     procedure was aborted at this point and the scope was withdrawn. ?     Normal mucosa was found from 0 to 4 cm proximal to the anus. ?Impression:            - Preparation of the colon was poor. ?                       - Rectal mass 4.0 cm from the anal verge. ?                       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 Biopsied. ?                       - Normal mucosa in the recto-sigmoid colon. ?                       - Stool in the recto-sigmoid colon. ?                       - Normal mucosa from 0 to 4 cm proximal to the anus. ?Recommendation:        - Await pathology results. ?                       - Clear liquid diet - advance as tolerated to advance  ?  diet as tolerated. ?                       - Stat request made for pathology results while  ?                       patient in hospital (within 24 hours). ?                       - If the pathology report is malignant, then refer to  ?                       an oncologist. ?                       - If  the pathology report is malignant, then refer to  ?                       a radiation oncologist. ?                       - If pathology of mass is malignant, then colorectal  ?                       surgery referral. ?                       - The findings and recommendations were discussed with  ?                       the patient and their family. ?                       - The findings and recommendations were discussed with  ?                       the referring physician. ?Procedure Code(s):     --- Professional --- ?                       [phone], 52, Sigmoidoscopy, flexible; with biopsy,  ?                       single or multiple ?Diagnosis Code(s):     --- Professional --- ?                       R93.3, Abnormal findings on diagnostic imaging of  ?                       other parts of digestive tract ?                       K59.00, Constipation, unspecified ?                       R19.5, Other fecal abnormalities ?                       R63.4, Abnormal weight loss ?                       K92.1, Melena (includes Hematochezia) ?  R10.31, Right lower quadrant pain ?                       K56.690, Other partial intestinal obstruction ?                       D49.0, Neoplasm of unspecified behavior of digestive  ?                       system ?                       K62.89, Other specified diseases of anus and rectum ?CPT copyright 2019 [employer] Medical Association. All rights reserved. ?The codes documented in this report are preliminary and upon coder review may  ?be revised to meet current compliance requirements. ?Efrain Sella MD, MD ?04/16/2021 4:29:30 PM ?This report has been signed electronically. ?Number of Addenda: 0 ?Note Initiated On: 04/16/2021 3:45 PM ?Total Procedure Duration: 0 hours 10 minutes 4 seconds  ?Estimated Blood Loss:  Estimated blood loss was minimal. Estimated blood loss  ?                       was minimal. ?     [facility] ?

## 2021-04-16 NOTE — Anesthesia Postprocedure Evaluation (Signed)
Anesthesia Post Note ? ?Patient: Jeremiah Allen ? ?Procedure(s) Performed: FLEXIBLE SIGMOIDOSCOPY ? ?Patient location during evaluation: PACU ?Anesthesia Type: General ?Level of consciousness: awake and alert, oriented and patient cooperative ?Pain management: pain level controlled ?Vital Signs Assessment: post-procedure vital signs reviewed and stable ?Respiratory status: spontaneous breathing, nonlabored ventilation and respiratory function stable ?Cardiovascular status: blood pressure returned to baseline and stable ?Postop Assessment: adequate PO intake ?Anesthetic complications: no ? ? ?No notable events documented. ? ? ?Last Vitals:  ?Vitals:  ? 04/16/21 1647 04/16/21 1657  ?BP: 123/75   ?Pulse: 66 66  ?Resp: 14 18  ?Temp:    ?SpO2: 100% 100%  ?  ?Last Pain:  ?Vitals:  ? 04/16/21 1647  ?TempSrc:   ?PainSc: 0-No pain  ? ? ?  ?  ?  ?  ?  ?  ? ?Darrin Nipper ? ? ? ? ?

## 2021-04-16 NOTE — Progress Notes (Signed)
Cross Cover ?Patient reports itching all over from dilaudid. Dose of benadryl ordered and pain med changed to morphine ?

## 2021-04-16 NOTE — ED Provider Notes (Signed)
Discussed the patient's clinical presentation and concerns on imaging findings with Dr. Francine Graven of the hospitalist, after obtaining her consultation patient will be admitted for further care and treatment.  In addition, Dr. Leonides Schanz discussed with the patient plan for admission and he is agreeable. ? ?I have placed a consult with GI Dr. Alice Reichert, and he is acknowledges consult and his team will see in consultation today. ? ? ?  Delman Kitten, MD ?04/16/21 [phone] ? ?

## 2021-04-16 NOTE — ED Provider Notes (Signed)
? ?[facility] ?Provider Note ? ? ? Event Date/Time  ? First MD Initiated Contact with Patient 04/16/21 0533   ?  (approximate) ? ? ?History  ? ?Abdominal Pain ? ? ?HPI ? ?Jeremiah Allen is a 36 y.o. male with no significant past medical history who presents to the emergency department with his significant other with complaints of diffuse, sharp and severe abdominal pain worse in the right lower quadrant, diarrhea with 2 episodes of bowel incontinence and also having hematochezia.  No melena.  States symptoms have been worse over the past couple of days but have been ongoing for about a week now.  He states that he has had intermittent symptoms like this for several months.  He was seen by his primary care doctor about a month ago and was told that he had colitis based on clinical examination and was started on Carafate.  He denies any history of IBD, IBS.  No family hx of IBD.  He has never had a colonoscopy or endoscopy.  He is not on any blood thinners.  No previous abdominal surgeries.  Denies any fevers but has had chills.  States he does have some discomfort with urination where he feels like his urine is trying to get past a "blockage" but it does not burn.  No hematuria, discharge.  No testicular pain or swelling.  Denies recent travel, sick contact, hospitalization, antibiotic use. ? ? ?History provided by patient and significant other. ? ? ? ?History reviewed. No pertinent past medical history. ? ?History reviewed. No pertinent surgical history. ? ?MEDICATIONS:  ?Prior to Admission medications   ?Medication Sig Start Date End Date Taking? Authorizing Provider  ?hydrOXYzine (ATARAX/VISTARIL) 25 MG tablet Take 1 tablet (25 mg total) by mouth every 6 (six) hours as needed for anxiety. 08/17/20   Orpah Greek, MD  ? ? ?Physical Exam  ? ?Triage Vital Signs: ?ED Triage Vitals  ?Enc Vitals Group  ?   BP 04/16/21 0535 129/81  ?   Pulse Rate 04/16/21 0535 84  ?   Resp 04/16/21 0535 18   ?   Temp 04/16/21 0535 98.4 ?F (36.9 ?C)  ?   Temp Source 04/16/21 0535 Oral  ?   SpO2 04/16/21 0535 100 %  ?   Weight 04/16/21 0527 150 lb (68 kg)  ?   Height 04/16/21 0527 '5\' 9"'$  (1.753 m)  ?   Head Circumference --   ?   Peak Flow --   ?   Pain Score 04/16/21 0527 6  ?   Pain Loc --   ?   Pain Edu? --   ?   Excl. in [place]? --   ? ? ?Most recent vital signs: ?Vitals:  ? 04/16/21 0600 04/16/21 0656  ?BP: 112/71 111/63  ?Pulse: 61 61  ?Resp: 16 18  ?Temp:    ?SpO2: 100% 100%  ? ? ?CONSTITUTIONAL: Alert and oriented and responds appropriately to questions. Well-appearing; well-nourished, thin, afebrile ?HEAD: Normocephalic, atraumatic ?EYES: Conjunctivae clear, pupils appear equal, sclera nonicteric ?ENT: normal nose; moist mucous membranes ?NECK: Supple, normal ROM ?CARD: RRR; S1 and S2 appreciated; no murmurs, no clicks, no rubs, no gallops ?RESP: Normal chest excursion without splinting or tachypnea; breath sounds clear and equal bilaterally; no wheezes, no rhonchi, no rales, no hypoxia or respiratory distress, speaking full sentences ?ABD/GI: Normal bowel sounds; non-distend ed; soft, tender to palpation diffusely with guarding at McBurney's point, no rebound ?RECTAL:  Normal rectal tone, + gross blood,  no melena, guaiac POSITIVE, no hemorrhoids appreciated, nontender rectal exam, no fecal impaction.  Normal rectal tone.  Chaperone present. ?BACK: The back appears normal ?EXT: Normal ROM in all joints; no deformity noted, no edema; no cyanosis ?SKIN: Normal color for age and race; warm; no rash on exposed skin ?NEURO: Moves all extremities equally, normal speech, normal gait ?PSYCH: The patient's mood and manner are appropriate. ? ? ?ED Results / Procedures / Treatments  ? ?LABS: ?(all labs ordered are listed, but only abnormal results are displayed) ?Labs Reviewed  ?CBC WITH DIFFERENTIAL/PLATELET - Abnormal; Notable for the following components:  ?    Result Value  ? Hemoglobin 12.5 (*)   ? HCT 38.5 (*)   ? All  other components within normal limits  ?GASTROINTESTINAL PANEL BY PCR, STOOL (REPLACES STOOL CULTURE)  ?RESP PANEL BY RT-PCR (FLU A&B, COVID) ARPGX2  ?COMPREHENSIVE METABOLIC PANEL  ?LIPASE, BLOOD  ?PROTIME-INR  ?URINALYSIS, ROUTINE W REFLEX MICROSCOPIC  ?TYPE AND SCREEN  ? ? ? ?EKG: ? ? ?RADIOLOGY: ?My personal review and interpretation of imaging: CT scan shows rectal inflammation but no active extravasation. ? ?I have personally reviewed all radiology reports.   ?CT Angio Abd/Pel W and/or Wo Contrast ? ?Result Date: 04/16/2021 ?CLINICAL DATA:  36 year old male with diffuse abdominal pain and persistent rectal bleeding. Right lower quadrant pain. Recently diagnosed with colitis. EXAM: CTA ABDOMEN AND PELVIS WITHOUT AND WITH CONTRAST TECHNIQUE: Multidetector CT imaging of the abdomen and pelvis was performed using the standard protocol during bolus administration of intravenous contrast. Multiplanar reconstructed images and MIPs were obtained and reviewed to evaluate the vascular anatomy. RADIATION DOSE REDUCTION: This exam was performed according to the departmental dose-optimization program which includes automated exposure control, adjustment of the mA and/or kV according to patient size and/or use of iterative reconstruction technique. CONTRAST:  176m OMNIPAQUE IOHEXOL 350 MG/ML SOLN COMPARISON:  CTA chest 08/15/2020. FINDINGS: VASCULAR Normal visible aorta, iliac arteries and proximal femoral arteries. No atherosclerosis. Aortic branches appear patent and normal. On the delayed phase the portal venous system is patent. Central pelvic veins also appear to be patent. Review of the MIP images confirms the above findings. NON-VASCULAR Lower chest: Resolved lower pneumomediastinum seen last year. Normal lung bases now. No pericardial or pleural effusion. Hepatobiliary: Nonspecific and indistinct 15 mm hypodense area in the right lateral liver dome on series 16, image 14 appears similar on both the early arterial  and portal venous phase images. And there is a similar subtle hypodense liver lesion in the posterior right lobe on series 16, image 36. Negative gallbladder. No bile duct enlargement. Pancreas: Negative. Spleen: Negative. Adrenals/Urinary Tract: Negative. Adrenal glands and kidneys are within normal limits. No nephrolithiasis. Unremarkable bladder. Stomach/Bowel: Bulky and irregular rectal wall thickening up to 16 mm is circumferential but asymmetric. See series 16, image 170 and series 19, image 81. Associated perirectal lymphadenopathy (series 19, image 94). And prominent perirectal venous vasculature. The wall thickening appears to gradually taper proximally such that the junction of the sigmoid colon and rectum has a more normal appearance. Sigmoid appears negative aside from retained stool. Similar appearance of the descending and transverse colon with retained stool. More decompressed right colon appears grossly normal. Questionable normal appendix visible on series 19, image 49. Decompressed terminal ileum. No dilated small bowel. Stomach and duodenum appear within normal limits. No free air. No free fluid. No contrast extravasation into the bowel lumen. Lymphatic: Perirectal lymphadenopathy with individual nodes up to 8 mm short axis (series 16,  image 170). No other lymphadenopathy identified in the abdomen or pelvis. Reproductive: Negative. Other: No pelvic free fluid. Musculoskeletal: No acute osseous abnormality identified. Benign bone islands about both hips. IMPRESSION: 1. Bulky and irregular rectal wall thickening up to 16 mm with perirectal lymphadenopathy, and two indeterminate liver lesions. While this might be an inflammatory proctitis, cannot exclude Rectal Carcinoma with Liver Metastases. Recommend Endoscopy. 2. No vascular abnormality on CTA. Other bowel segments appear normal. And no other abnormality identified in the abdomen or pelvis. Electronically Signed   By: Genevie Ann M.D.   On:  04/16/2021 07:01   ? ? ?PROCEDURES: ? ?Critical Care performed: No ? ? ?CRITICAL CARE ?Performed by: Cyril Mourning Banjamin Stovall ? ? ?Total critical care time: 0 minutes ? ?Critical care time was exclusive of separately billable

## 2021-04-16 NOTE — Anesthesia Preprocedure Evaluation (Addendum)
Anesthesia Evaluation  ?Patient identified by MRN, date of birth, ID band ?Patient awake ? ? ? ?Reviewed: ?Allergy & Precautions, NPO status , Patient's Chart, lab work & pertinent test results ? ?History of Anesthesia Complications ?Negative for: history of anesthetic complications ? ?Airway ?Mallampati: I ? ? ?Neck ROM: Full ? ? ? Dental ?no notable dental hx. ? ?  ?Pulmonary ?Current Smoker (3-4 cigarettes per day) and Patient abstained from smoking.,  ?  ?Pulmonary exam normal ?breath sounds clear to auscultation ? ? ? ? ? ? Cardiovascular ?Exercise Tolerance: Good ?negative cardio ROS ?Normal cardiovascular exam ?Rhythm:Regular Rate:Normal ? ?ECG 08/16/20: NSR, incomplete RBBB ?  ?Neuro/Psych ?PSYCHIATRIC DISORDERS Bipolar Disorder negative neurological ROS ?   ? GI/Hepatic ?Rectal bleeding ?  ?Endo/Other  ?negative endocrine ROS ? Renal/GU ?negative Renal ROS  ? ?  ?Musculoskeletal ? ? Abdominal ?  ?Peds ? Hematology ?negative hematology ROS ?(+)   ?Anesthesia Other Findings ? ? Reproductive/Obstetrics ? ?  ? ? ? ? ? ? ? ? ? ? ? ? ? ?  ?  ? ? ? ? ? ? ? ?Anesthesia Physical ?Anesthesia Plan ? ?ASA: 2 ? ?Anesthesia Plan: General  ? ?Post-op Pain Management:   ? ?Induction: Intravenous ? ?PONV Risk Score and Plan: 1 and Propofol infusion, TIVA and Treatment may vary due to age or medical condition ? ?Airway Management Planned: Natural Airway ? ?Additional Equipment:  ? ?Intra-op Plan:  ? ?Post-operative Plan:  ? ?Informed Consent: I have reviewed the patients History and Physical, chart, labs and discussed the procedure including the risks, benefits and alternatives for the proposed anesthesia with the patient or authorized representative who has indicated his/her understanding and acceptance.  ? ? ? ? ? ?Plan Discussed with: CRNA ? ?Anesthesia Plan Comments: (LMA/GETA backup discussed.  Patient consented for risks of anesthesia including but not limited to:  ?- adverse reactions  to medications ?- damage to eyes, teeth, lips or other oral mucosa ?- nerve damage due to positioning  ?- sore throat or hoarseness ?- damage to heart, brain, nerves, lungs, other parts of body or loss of life ? ?Informed patient about role of CRNA in peri- and intra-operative care.  Patient voiced understanding.)  ? ? ? ? ? ? ?Anesthesia Quick Evaluation ? ?

## 2021-04-16 NOTE — Interval H&P Note (Signed)
History and Physical Interval Note: ? ?04/16/2021 ?3:54 PM ? ?Jeremiah Allen  has presented today for surgery, with the diagnosis of Rectal bleeding ,weight loss, abnormal CT of GI tract, change in bowel habits.  The various methods of treatment have been discussed with the patient and family. After consideration of risks, benefits and other options for treatment, the patient has consented to  Procedure(s): ?FLEXIBLE SIGMOIDOSCOPY (N/A) as a surgical intervention.  The patient's history has been reviewed, patient examined, no change in status, stable for surgery.  I have reviewed the patient's chart and labs.  Questions were answered to the patient's satisfaction.   ? ? ?[place], [place] ? ? ?

## 2021-04-16 NOTE — Consult Note (Signed)
? ? ?[facility] Inpatient Consult Note ? ? ?Kathline Magic, M.D. ? ?Reason for Consult: Rectal bleeding, change in bowel habits, abdominal pain, abnormal CT of the rectum. ?  ?Attending Requesting Consult: Collier Bullock, M.D. ? ? ?History of Present Illness: ?Jeremiah Allen is a 36 y.o. male who presents to the emergency room within the past several hours for progressive lower abdominal pain, rectal bleeding and change in bowel habits.  Patient remarks that he began having symptoms of constipation about 2 months ago along with small amounts of rectal bleeding.  Increased abdominal straining was necessary to have a bowel movement and still he had difficulty with having a solid bowel movement.  He continues to have loose to runny stools.  Bleeding has ceased in the past couple of weeks but he has had intermittent hematochezia described as "red blood" which sometimes can accompany the stool.  He denies any fevers chills or sweats.  He has had an involuntary weight loss of 25 pounds over the last 3 months secondary to progressive discomfort and change in bowel habits.  He denies a family history of colorectal cancer or polyps. ? ?Past Medical History:  ?History reviewed. No pertinent past medical history.  ?Problem List: ?Patient Active Problem List  ? Diagnosis Date Noted  ? Rectal bleeding 04/16/2021  ? Nicotine dependence 04/16/2021  ? Bipolar 1 disorder ([place]) 04/16/2021  ? Sciatica 04/16/2021  ?  ?Past Surgical History: ?History reviewed. No pertinent surgical history.  ?Allergies: ?No Known Allergies  ?Home Medications: ?Medications Prior to Admission  ?Medication Sig Dispense Refill Last Dose  ? acetaminophen (TYLENOL) 500 MG tablet Take 500-1,000 mg by mouth every 6 (six) hours as needed for mild pain or moderate pain.   Unknown at PRN  ? ARIPiprazole (ABILIFY) 2 MG tablet Take 2 mg by mouth daily.   Past Week at Unknown  ? gabapentin (NEURONTIN) 300 MG capsule Take 300 mg by mouth 2 (two) times  daily.   04/15/2021 at Unknown  ? hydrOXYzine (ATARAX) 10 MG tablet Take 10 mg by mouth 3 (three) times daily as needed for anxiety.   04/15/2021 at PRN  ? ibuprofen (ADVIL) 200 MG tablet Take 400-800 mg by mouth every 6 (six) hours as needed for mild pain or moderate pain.   Unknown at PRN  ? sucralfate (CARAFATE) 1 g tablet Take 1 g by mouth 2 (two) times daily.   Past Week at Unknown  ? ?Home medication reconciliation was completed with the patient.  ? ?Scheduled Inpatient Medications: ?  ? ARIPiprazole  2 mg Oral Daily  ? gabapentin  300 mg Oral BID  ? [START ON 04/17/2021] influenza vac split quadrivalent PF  0.5 mL Intramuscular Tomorrow-1000  ? ? ?Continuous Inpatient Infusions: ?  ? sodium chloride 100 mL/hr at 04/16/21 0935  ? ? ?PRN Inpatient Medications:  ?hydrOXYzine, morphine injection, ondansetron **OR** ondansetron (ZOFRAN) IV ? ?Family History: ?family history is not on file.  ? ?GI Family History: Negative for colon cancer or inflammatory bowel disease. ? ?Social History:  ? reports that he has been smoking cigarettes. He does not have any smokeless tobacco history on file. He reports that he does not drink alcohol and does not use drugs. The patient denies ETOH, tobacco, or drug use.  ? ? ?Review of Systems: Review of Systems - Negative except HPI ? ? ?Physical Examination: ?BP 106/69   Pulse (!) 57   Temp 97.7 ?F (36.5 ?C) (Oral)   Resp 20  Ht '5\' 9"'$  (1.753 m)   Wt 68 kg   SpO2 100%   BMI 22.15 kg/m?  ?Physical Exam ?Constitutional:   ?   General: He is in acute distress.  ?   Appearance: He is well-developed and normal weight. He is not toxic-appearing.  ?HENT:  ?   Head: Normocephalic and atraumatic.  ?Eyes:  ?   Extraocular Movements: Extraocular movements intact.  ?   Pupils: Pupils are equal, round, and reactive to light.  ?Cardiovascular:  ?   Rate and Rhythm: Normal rate.  ?   Heart sounds: No murmur heard. ?  Friction rub present. No gallop.  ?Pulmonary:  ?   Effort: Pulmonary effort  is normal.  ?   Breath sounds: Normal breath sounds. No stridor. No wheezing, rhonchi or rales.  ?Abdominal:  ?   General: Abdomen is flat. Bowel sounds are normal. There is no distension. There are no signs of injury.  ?   Palpations: Abdomen is soft.  ?   Tenderness: There is abdominal tenderness in the right lower quadrant. There is guarding. Negative signs include Rovsing's sign and psoas sign.  ?   Hernia: No hernia is present.  ?Skin: ?   General: Skin is warm and dry.  ?   Capillary Refill: Capillary refill takes less than 2 seconds.  ?Neurological:  ?   General: No focal deficit present.  ?   Mental Status: He is alert and oriented to person, place, and time.  ?Psychiatric:     ?   Mood and Affect: Mood normal. Mood is not depressed.     ?   Behavior: Behavior normal.  ? ? ?Data: ?Lab Results  ?Component Value Date  ? WBC 5.6 04/16/2021  ? HGB 12.5 (L) 04/16/2021  ? HCT 38.5 (L) 04/16/2021  ? MCV 90.8 04/16/2021  ? PLT 284 04/16/2021  ? ?Recent Labs  ?Lab 04/16/21 ?1610  ?HGB 12.5*  ? ?Lab Results  ?Component Value Date  ? NA 138 04/16/2021  ? K 4.0 04/16/2021  ? CL 104 04/16/2021  ? CO2 25 04/16/2021  ? BUN 7 04/16/2021  ? CREATININE 0.81 04/16/2021  ? ?Lab Results  ?Component Value Date  ? ALT 14 04/16/2021  ? AST 19 04/16/2021  ? ALKPHOS 72 04/16/2021  ? BILITOT 0.5 04/16/2021  ? ?Recent Labs  ?Lab 04/16/21 ?9604  ?INR 1.1  ? ?CBC Latest Ref Rng & Units 04/16/2021 08/16/2020 08/15/2020  ?WBC 4.0 - 10.5 K/uL 5.6 7.4 10.7(H)  ?Hemoglobin 13.0 - 17.0 g/dL 12.5(L) 13.2 13.2  ?Hematocrit 39.0 - 52.0 % 38.5(L) 38.9(L) 39.0  ?Platelets 150 - 400 K/uL 284 234 255  ? ? ?STUDIES: ?CT Angio Abd/Pel W and/or Wo Contrast ? ?Result Date: 04/16/2021 ?CLINICAL DATA:  36 year old male with diffuse abdominal pain and persistent rectal bleeding. Right lower quadrant pain. Recently diagnosed with colitis. EXAM: CTA ABDOMEN AND PELVIS WITHOUT AND WITH CONTRAST TECHNIQUE: Multidetector CT imaging of the abdomen and pelvis was  performed using the standard protocol during bolus administration of intravenous contrast. Multiplanar reconstructed images and MIPs were obtained and reviewed to evaluate the vascular anatomy. RADIATION DOSE REDUCTION: This exam was performed according to the departmental dose-optimization program which includes automated exposure control, adjustment of the mA and/or kV according to patient size and/or use of iterative reconstruction technique. CONTRAST:  118m OMNIPAQUE IOHEXOL 350 MG/ML SOLN COMPARISON:  CTA chest 08/15/2020. FINDINGS: VASCULAR Normal visible aorta, iliac arteries and proximal femoral arteries. No atherosclerosis. Aortic branches appear patent  and normal. On the delayed phase the portal venous system is patent. Central pelvic veins also appear to be patent. Review of the MIP images confirms the above findings. NON-VASCULAR Lower chest: Resolved lower pneumomediastinum seen last year. Normal lung bases now. No pericardial or pleural effusion. Hepatobiliary: Nonspecific and indistinct 15 mm hypodense area in the right lateral liver dome on series 16, image 14 appears similar on both the early arterial and portal venous phase images. And there is a similar subtle hypodense liver lesion in the posterior right lobe on series 16, image 36. Negative gallbladder. No bile duct enlargement. Pancreas: Negative. Spleen: Negative. Adrenals/Urinary Tract: Negative. Adrenal glands and kidneys are within normal limits. No nephrolithiasis. Unremarkable bladder. Stomach/Bowel: Bulky and irregular rectal wall thickening up to 16 mm is circumferential but asymmetric. See series 16, image 170 and series 19, image 81. Associated perirectal lymphadenopathy (series 19, image 94). And prominent perirectal venous vasculature. The wall thickening appears to gradually taper proximally such that the junction of the sigmoid colon and rectum has a more normal appearance. Sigmoid appears negative aside from retained stool.  Similar appearance of the descending and transverse colon with retained stool. More decompressed right colon appears grossly normal. Questionable normal appendix visible on series 19, image 49. Decompressed terminal

## 2021-04-16 NOTE — Anesthesia Procedure Notes (Signed)
Procedure Name: General with mask airway ?Date/Time: 04/16/2021 4:04 PM ?Performed by: Kelton Pillar, CRNA ?Pre-anesthesia Checklist: Patient identified, Emergency Drugs available, Suction available and Patient being monitored ?Patient Re-evaluated:Patient Re-evaluated prior to induction ?Oxygen Delivery Method: Simple face mask ?Induction Type: IV induction ?Placement Confirmation: positive ETCO2 and CO2 detector ?Dental Injury: Teeth and Oropharynx as per pre-operative assessment  ? ? ? ? ?

## 2021-04-16 NOTE — H&P (View-Only) (Signed)
? ? ?[facility] GI Inpatient Consult Note ? ? ?Jeremiah Allen, M.D. ? ?Reason for Consult: Rectal bleeding, change in bowel habits, abdominal pain, abnormal CT of the rectum. ?  ?Attending Requesting Consult: Jeremiah Allen, M.D. ? ? ?History of Present Illness: ?Jeremiah Allen is a 36 y.o. male who presents to the emergency room within the past several hours for progressive lower abdominal pain, rectal bleeding and change in bowel habits.  Patient remarks that he began having symptoms of constipation about 2 months ago along with small amounts of rectal bleeding.  Increased abdominal straining was necessary to have a bowel movement and still he had difficulty with having a solid bowel movement.  He continues to have loose to runny stools.  Bleeding has ceased in the past couple of weeks but he has had intermittent hematochezia described as "red blood" which sometimes can accompany the stool.  He denies any fevers chills or sweats.  He has had an involuntary weight loss of 25 pounds over the last 3 months secondary to progressive discomfort and change in bowel habits.  He denies a family history of colorectal cancer or polyps. ? ?Past Medical History:  ?History reviewed. No pertinent past medical history.  ?Problem List: ?Patient Active Problem List  ? Diagnosis Date Noted  ? Rectal bleeding 04/16/2021  ? Nicotine dependence 04/16/2021  ? Bipolar 1 disorder ([place]) 04/16/2021  ? Sciatica 04/16/2021  ?  ?Past Surgical History: ?History reviewed. No pertinent surgical history.  ?Allergies: ?No Known Allergies  ?Home Medications: ?Medications Prior to Admission  ?Medication Sig Dispense Refill Last Dose  ? acetaminophen (TYLENOL) 500 MG tablet Take 500-1,000 mg by mouth every 6 (six) hours as needed for mild pain or moderate pain.   Unknown at PRN  ? ARIPiprazole (ABILIFY) 2 MG tablet Take 2 mg by mouth daily.   Past Week at Unknown  ? gabapentin (NEURONTIN) 300 MG capsule Take 300 mg by mouth 2 (two) times  daily.   04/15/2021 at Unknown  ? hydrOXYzine (ATARAX) 10 MG tablet Take 10 mg by mouth 3 (three) times daily as needed for anxiety.   04/15/2021 at PRN  ? ibuprofen (ADVIL) 200 MG tablet Take 400-800 mg by mouth every 6 (six) hours as needed for mild pain or moderate pain.   Unknown at PRN  ? sucralfate (CARAFATE) 1 g tablet Take 1 g by mouth 2 (two) times daily.   Past Week at Unknown  ? ?Home medication reconciliation was completed with the patient.  ? ?Scheduled Inpatient Medications: ?  ? ARIPiprazole  2 mg Oral Daily  ? gabapentin  300 mg Oral BID  ? [START ON 04/17/2021] influenza vac split quadrivalent PF  0.5 mL Intramuscular Tomorrow-1000  ? ? ?Continuous Inpatient Infusions: ?  ? sodium chloride 100 mL/hr at 04/16/21 0935  ? ? ?PRN Inpatient Medications:  ?hydrOXYzine, morphine injection, ondansetron **OR** ondansetron (ZOFRAN) IV ? ?Family History: ?family history is not on file.  ? ?GI Family History: Negative for colon cancer or inflammatory bowel disease. ? ?Social History:  ? reports that he has been smoking cigarettes. He does not have any smokeless tobacco history on file. He reports that he does not drink alcohol and does not use drugs. The patient denies ETOH, tobacco, or drug use.  ? ? ?Review of Systems: Review of Systems - Negative except HPI ? ? ?Physical Examination: ?BP 106/69   Pulse (!) 57   Temp 97.7 ?F (36.5 ?C) (Oral)   Resp 20  Ht '5\' 9"'$  (1.753 m)   Wt 68 kg   SpO2 100%   BMI 22.15 kg/m?  ?Physical Exam ?Constitutional:   ?   General: He is in acute distress.  ?   Appearance: He is well-developed and normal weight. He is not toxic-appearing.  ?HENT:  ?   Head: Normocephalic and atraumatic.  ?Eyes:  ?   Extraocular Movements: Extraocular movements intact.  ?   Pupils: Pupils are equal, round, and reactive to light.  ?Cardiovascular:  ?   Rate and Rhythm: Normal rate.  ?   Heart sounds: No murmur heard. ?  Friction rub present. No gallop.  ?Pulmonary:  ?   Effort: Pulmonary effort  is normal.  ?   Breath sounds: Normal breath sounds. No stridor. No wheezing, rhonchi or rales.  ?Abdominal:  ?   General: Abdomen is flat. Bowel sounds are normal. There is no distension. There are no signs of injury.  ?   Palpations: Abdomen is soft.  ?   Tenderness: There is abdominal tenderness in the right lower quadrant. There is guarding. Negative signs include Rovsing's sign and psoas sign.  ?   Hernia: No hernia is present.  ?Skin: ?   General: Skin is warm and dry.  ?   Capillary Refill: Capillary refill takes less than 2 seconds.  ?Neurological:  ?   General: No focal deficit present.  ?   Mental Status: He is alert and oriented to person, place, and time.  ?Psychiatric:     ?   Mood and Affect: Mood normal. Mood is not depressed.     ?   Behavior: Behavior normal.  ? ? ?Data: ?Lab Results  ?Component Value Date  ? WBC 5.6 04/16/2021  ? HGB 12.5 (L) 04/16/2021  ? HCT 38.5 (L) 04/16/2021  ? MCV 90.8 04/16/2021  ? PLT 284 04/16/2021  ? ?Recent Labs  ?Lab 04/16/21 ?0973  ?HGB 12.5*  ? ?Lab Results  ?Component Value Date  ? NA 138 04/16/2021  ? K 4.0 04/16/2021  ? CL 104 04/16/2021  ? CO2 25 04/16/2021  ? BUN 7 04/16/2021  ? CREATININE 0.81 04/16/2021  ? ?Lab Results  ?Component Value Date  ? ALT 14 04/16/2021  ? AST 19 04/16/2021  ? ALKPHOS 72 04/16/2021  ? BILITOT 0.5 04/16/2021  ? ?Recent Labs  ?Lab 04/16/21 ?5329  ?INR 1.1  ? ?CBC Latest Ref Rng & Units 04/16/2021 08/16/2020 08/15/2020  ?WBC 4.0 - 10.5 K/uL 5.6 7.4 10.7(H)  ?Hemoglobin 13.0 - 17.0 g/dL 12.5(L) 13.2 13.2  ?Hematocrit 39.0 - 52.0 % 38.5(L) 38.9(L) 39.0  ?Platelets 150 - 400 K/uL 284 234 255  ? ? ?STUDIES: ?CT Angio Abd/Pel W and/or Wo Contrast ? ?Result Date: 04/16/2021 ?CLINICAL DATA:  36 year old male with diffuse abdominal pain and persistent rectal bleeding. Right lower quadrant pain. Recently diagnosed with colitis. EXAM: CTA ABDOMEN AND PELVIS WITHOUT AND WITH CONTRAST TECHNIQUE: Multidetector CT imaging of the abdomen and pelvis was  performed using the standard protocol during bolus administration of intravenous contrast. Multiplanar reconstructed images and MIPs were obtained and reviewed to evaluate the vascular anatomy. RADIATION DOSE REDUCTION: This exam was performed according to the departmental dose-optimization program which includes automated exposure control, adjustment of the mA and/or kV according to patient size and/or use of iterative reconstruction technique. CONTRAST:  140m OMNIPAQUE IOHEXOL 350 MG/ML SOLN COMPARISON:  CTA chest 08/15/2020. FINDINGS: VASCULAR Normal visible aorta, iliac arteries and proximal femoral arteries. No atherosclerosis. Aortic branches appear patent  and normal. On the delayed phase the portal venous system is patent. Central pelvic veins also appear to be patent. Review of the MIP images confirms the above findings. NON-VASCULAR Lower chest: Resolved lower pneumomediastinum seen last year. Normal lung bases now. No pericardial or pleural effusion. Hepatobiliary: Nonspecific and indistinct 15 mm hypodense area in the right lateral liver dome on series 16, image 14 appears similar on both the early arterial and portal venous phase images. And there is a similar subtle hypodense liver lesion in the posterior right lobe on series 16, image 36. Negative gallbladder. No bile duct enlargement. Pancreas: Negative. Spleen: Negative. Adrenals/Urinary Tract: Negative. Adrenal glands and kidneys are within normal limits. No nephrolithiasis. Unremarkable bladder. Stomach/Bowel: Bulky and irregular rectal wall thickening up to 16 mm is circumferential but asymmetric. See series 16, image 170 and series 19, image 81. Associated perirectal lymphadenopathy (series 19, image 94). And prominent perirectal venous vasculature. The wall thickening appears to gradually taper proximally such that the junction of the sigmoid colon and rectum has a more normal appearance. Sigmoid appears negative aside from retained stool.  Similar appearance of the descending and transverse colon with retained stool. More decompressed right colon appears grossly normal. Questionable normal appendix visible on series 19, image 49. Decompressed terminal

## 2021-04-16 NOTE — ED Notes (Signed)
Hold oral meds until seen by GI per Dr Francine Graven ?

## 2021-04-16 NOTE — ED Triage Notes (Signed)
Patient ambulatory to triage with steady gait, without difficulty or distress noted; pt reports lower abd pain with urinary urgency x 2-3 days; st recently dx with colitis by PCP and rx sucralfate ?

## 2021-04-17 ENCOUNTER — Telehealth: Payer: Self-pay | Admitting: Radiation Oncology

## 2021-04-17 ENCOUNTER — Inpatient Hospital Stay: Payer: Self-pay

## 2021-04-17 ENCOUNTER — Encounter: Payer: Self-pay | Admitting: Internal Medicine

## 2021-04-17 LAB — GASTROINTESTINAL PANEL BY PCR, STOOL (REPLACES STOOL CULTURE)

## 2021-04-17 LAB — CBC
HCT: 33.6 % — ABNORMAL LOW (ref 39.0–52.0)
Hemoglobin: 11.1 g/dL — ABNORMAL LOW (ref 13.0–17.0)
MCH: 30.1 pg (ref 26.0–34.0)
MCHC: 33 g/dL (ref 30.0–36.0)
MCV: 91.1 fL (ref 80.0–100.0)
Platelets: 248 10*3/uL (ref 150–400)
RBC: 3.69 MIL/uL — ABNORMAL LOW (ref 4.22–5.81)
RDW: 11.7 % (ref 11.5–15.5)
WBC: 4.6 10*3/uL (ref 4.0–10.5)
nRBC: 0 % (ref 0.0–0.2)

## 2021-04-17 LAB — IRON AND TIBC
Iron: 35 ug/dL — ABNORMAL LOW (ref 45–182)
Saturation Ratios: 16 % — ABNORMAL LOW (ref 17.9–39.5)
TIBC: 217 ug/dL — ABNORMAL LOW (ref 250–450)
UIBC: 182 ug/dL

## 2021-04-17 LAB — BASIC METABOLIC PANEL
Anion gap: 6 (ref 5–15)
BUN: 6 mg/dL (ref 6–20)
CO2: 28 mmol/L (ref 22–32)
Calcium: 8.4 mg/dL — ABNORMAL LOW (ref 8.9–10.3)
Chloride: 105 mmol/L (ref 98–111)
Creatinine, Ser: 0.87 mg/dL (ref 0.61–1.24)
GFR, Estimated: >60 mL/min (ref >60)
Glucose, Bld: 91 mg/dL (ref 70–99)
Potassium: 4.1 mmol/L (ref 3.5–5.1)
Sodium: 139 mmol/L (ref 135–145)

## 2021-04-17 LAB — HIV ANTIBODY (ROUTINE TESTING W REFLEX): HIV Screen 4th Generation wRfx: NONREACTIVE

## 2021-04-17 LAB — FERRITIN: Ferritin: 49 ng/mL (ref 24–336)

## 2021-04-17 MED ORDER — LEVOFLOXACIN 500 MG PO TABS
500.0000 mg | ORAL_TABLET | Freq: Every day | ORAL | Status: DC
  Administered 2021-04-17: 500 mg via ORAL
  Filled 2021-04-17: qty 1

## 2021-04-17 MED ORDER — GADOBUTROL 1 MMOL/ML IV SOLN
6.0000 mL | Freq: Once | INTRAVENOUS | Status: AC | PRN
  Administered 2021-04-17: 6 mL via INTRAVENOUS

## 2021-04-17 NOTE — OR Nursing (Signed)
Soap Suds enema given by student with my supervision and assistance at 11:04am ? ? ?

## 2021-04-17 NOTE — Progress Notes (Signed)
I received a secure chat from Mariea Clonts, Therapist, sports.  Pt is currently admitted to [facility].  He has probable rectal cancer and is requesting his care be given at [facility].  I will place outpatient oncology and radiation oncology referrals.  I called patient and left vm for return call. ?

## 2021-04-17 NOTE — Consult Note (Signed)
[facility] CONSULT NOTE  Patient Care Team: System, Provider Not In as PCP - General Benita Gutter, RN as Oncology Nurse Navigator  CHIEF COMPLAINTS/PURPOSE OF CONSULTATION: rectal mass  HISTORY OF PRESENTING ILLNESS:  Jeremiah Allen 36 y.o.  male with no significant past medical history has been referred to oncology for rectal mass/concerning for malignancy.  Patient noted to have rectal bleeding along with tenesmus for the last 3 to 4 months.  Initially thought to be hemorrhoidal.  Patient also episodes of loose stool/incontinent at times.  Positive for weight loss.  No nausea.  No vomiting.  No chest pain or shortness of breath or cough.  On admission to hospital patient noted to have-CT scan rectal thickening concerning for mass; perirectal lymph nodes subtle liver lesions x2.  Patient hemoglobin around 11.  Colonoscopy showed partially obstructing rectal mass 4 cm above the anal verge. Review of Systems  Constitutional:  Positive for malaise/fatigue and weight loss. Negative for chills, diaphoresis and fever.  HENT:  Negative for nosebleeds and sore throat.   Eyes:  Negative for double vision.  Respiratory:  Negative for cough, hemoptysis, sputum production, shortness of breath and wheezing.   Cardiovascular:  Negative for chest pain, palpitations, orthopnea and leg swelling.  Gastrointestinal:  Positive for abdominal pain, blood in stool and diarrhea. Negative for constipation, heartburn, melena, nausea and vomiting.  Genitourinary:  Negative for dysuria, frequency and urgency.  Musculoskeletal:  Negative for back pain and joint pain.  Skin: Negative.  Negative for itching and rash.  Neurological:  Negative for dizziness, tingling, focal weakness, weakness and headaches.  Endo/Heme/Allergies:  Does not bruise/bleed easily.  Psychiatric/Behavioral:  Negative for depression. The patient is not nervous/anxious and does not have insomnia.     MEDICAL HISTORY:   History reviewed. No pertinent past medical history.  SURGICAL HISTORY: History reviewed. No pertinent surgical history.  SOCIAL HISTORY: Social History   Socioeconomic History   Marital status: Single    Spouse name: Not on file   Number of children: Not on file   Years of education: Not on file   Highest education level: Not on file  Occupational History   Not on file  Tobacco Use   Smoking status: Every Day    Types: Cigarettes   Smokeless tobacco: Not on file  Vaping Use   Vaping Use: Never used  Substance and Sexual Activity   Alcohol use: Never   Drug use: Never   Sexual activity: Not on file  Other Topics Concern   Not on file  Social History Narrative   Lives in [place]; works for hotel- at front desk-over night. <1/2 ppd; no alcohol. Fiance. No children; 2 cats and dog.    Social Determinants of Health   Financial Resource Strain: Not on file  Food Insecurity: Not on file  Transportation Needs: Not on file  Physical Activity: Not on file  Stress: Not on file  Social Connections: Not on file  Intimate Partner Violence: Not on file    FAMILY HISTORY: Family History  Problem Relation Age of Onset   Heart disease Mother     ALLERGIES:  has No Known Allergies.  MEDICATIONS:  Current Facility-Administered Medications  Medication Dose Route Frequency Provider Last Rate Last Admin   0.9 %  sodium chloride infusion   Intravenous Continuous Agbata, Tochukwu, MD 100 mL/hr at 04/17/21 0746 New Bag at 04/17/21 0746   ARIPiprazole (ABILIFY) tablet 2 mg  2 mg Oral Daily Agbata, Tochukwu, MD  gabapentin (NEURONTIN) capsule 300 mg  300 mg Oral BID Agbata, Tochukwu, MD   300 mg at 04/16/21 2118   hydrOXYzine (ATARAX) tablet 10 mg  10 mg Oral TID PRN Agbata, Tochukwu, MD       influenza vac split quadrivalent PF (FLUARIX) injection 0.5 mL  0.5 mL Intramuscular Tomorrow-1000 Agbata, Tochukwu, MD       levofloxacin (LEVAQUIN) tablet 500 mg  500 mg Oral Daily  Sherryll Burger, Vipul, MD       morphine (PF) 2 MG/ML injection 2 mg  2 mg Intravenous Q4H PRN Manuela Schwartz, NP   2 mg at 04/17/21 0748   ondansetron (ZOFRAN) tablet 4 mg  4 mg Oral Q6H PRN Agbata, Tochukwu, MD       Or   ondansetron (ZOFRAN) injection 4 mg  4 mg Intravenous Q6H PRN Agbata, Tochukwu, MD          .  PHYSICAL EXAMINATION:  Vitals:   04/17/21 0518 04/17/21 0739  BP: 103/61 115/68  Pulse: 61 64  Resp: 19 18  Temp: 98 F (36.7 C) 98.1 F (36.7 C)  SpO2: 100% 100%   Filed Weights   04/16/21 0527 04/16/21 1541  Weight: 150 lb (68 kg) 150 lb (68 kg)    Physical Exam Vitals and nursing note reviewed.  HENT:     Head: Normocephalic and atraumatic.     Mouth/Throat:     Pharynx: Oropharynx is clear.  Eyes:     Extraocular Movements: Extraocular movements intact.     Pupils: Pupils are equal, round, and reactive to light.  Cardiovascular:     Rate and Rhythm: Normal rate and regular rhythm.  Pulmonary:     Comments: Decreased breath sounds bilaterally.  Abdominal:     Palpations: Abdomen is soft.  Musculoskeletal:        General: Normal range of motion.     Cervical back: Normal range of motion.  Skin:    General: Skin is warm.  Neurological:     General: No focal deficit present.     Mental Status: He is alert and oriented to person, place, and time.  Psychiatric:        Behavior: Behavior normal.        Judgment: Judgment normal.     LABORATORY DATA:  I have reviewed the data as listed Lab Results  Component Value Date   WBC 4.6 04/17/2021   HGB 11.1 (L) 04/17/2021   HCT 33.6 (L) 04/17/2021   MCV 91.1 04/17/2021   PLT 248 04/17/2021   Recent Labs    08/16/20 2240 04/16/21 0544 04/17/21 0448  NA 135 138 139  K 3.7 4.0 4.1  CL 103 104 105  CO2 25 25 28   GLUCOSE 108* 93 91  BUN 7 7 6   CREATININE 1.01 0.81 0.87  CALCIUM 9.2 9.1 8.4*  GFRNONAA >60 >60 >60  PROT 7.2 7.7  --   ALBUMIN 4.1 4.0  --   AST 19 19  --   ALT 13 14  --   ALKPHOS  73 72  --   BILITOT 0.6 0.5  --     RADIOGRAPHIC STUDIES: I have personally reviewed the radiological images as listed and agreed with the findings in the report. CT Angio Abd/Pel W and/or Wo Contrast  Result Date: 04/16/2021 CLINICAL DATA:  36 year old male with diffuse abdominal pain and persistent rectal bleeding. Right lower quadrant pain. Recently diagnosed with colitis. EXAM: CTA ABDOMEN AND PELVIS WITHOUT AND WITH CONTRAST TECHNIQUE:  Multidetector CT imaging of the abdomen and pelvis was performed using the standard protocol during bolus administration of intravenous contrast. Multiplanar reconstructed images and MIPs were obtained and reviewed to evaluate the vascular anatomy. RADIATION DOSE REDUCTION: This exam was performed according to the departmental dose-optimization program which includes automated exposure control, adjustment of the mA and/or kV according to patient size and/or use of iterative reconstruction technique. CONTRAST:  OMNIPAQUE IOHEXOL 350 MG/ML SOLN COMPARISON:  CTA chest 08/15/2020. FINDINGS: VASCULAR Normal visible aorta, iliac arteries and proximal femoral arteries. No atherosclerosis. Aortic branches appear patent and normal. On the delayed phase the portal venous system is patent. Central pelvic veins also appear to be patent. Review of the MIP images confirms the above findings. NON-VASCULAR Lower chest: Resolved lower pneumomediastinum seen last year. Normal lung bases now. No pericardial or pleural effusion. Hepatobiliary: Nonspecific and indistinct 15 mm hypodense area in the right lateral liver dome on series 16, image 14 appears similar on both the early arterial and portal venous phase images. And there is a similar subtle hypodense liver lesion in the posterior right lobe on series 16, image 36. Negative gallbladder. No bile duct enlargement. Pancreas: Negative. Spleen: Negative. Adrenals/Urinary Tract: Negative. Adrenal glands and kidneys are within normal  limits. No nephrolithiasis. Unremarkable bladder. Stomach/Bowel: Bulky and irregular rectal wall thickening up to 16 mm is circumferential but asymmetric. See series 16, image 170 and series 19, image 81. Associated perirectal lymphadenopathy (series 19, image 94). And prominent perirectal venous vasculature. The wall thickening appears to gradually taper proximally such that the junction of the sigmoid colon and rectum has a more normal appearance. Sigmoid appears negative aside from retained stool. Similar appearance of the descending and transverse colon with retained stool. More decompressed right colon appears grossly normal. Questionable normal appendix visible on series 19, image 49. Decompressed terminal ileum. No dilated small bowel. Stomach and duodenum appear within normal limits. No free air. No free fluid. No contrast extravasation into the bowel lumen. Lymphatic: Perirectal lymphadenopathy with individual nodes up to 8 mm short axis (series 16, image 170). No other lymphadenopathy identified in the abdomen or pelvis. Reproductive: Negative. Other: No pelvic free fluid. Musculoskeletal: No acute osseous abnormality identified. Benign bone islands about both hips. IMPRESSION: 1. Bulky and irregular rectal wall thickening up to 16 mm with perirectal lymphadenopathy, and two indeterminate liver lesions. While this might be an inflammatory proctitis, cannot exclude Rectal Carcinoma with Liver Metastases. Recommend Endoscopy. 2. No vascular abnormality on CTA. Other bowel segments appear normal. And no other abnormality identified in the abdomen or pelvis. Electronically Signed   By: Odessa Fleming M.D.   On: 04/16/2021 43:46    #37 year old with no significant medical history is admitted to hospital for lower GI bleed/noted to have rectal mass concerning for malignancy.  #Rectal mass-partially obstructing-highly concerning for malignancy.  S/p colonoscopy/biopsy results pending.  CT scan shows-rectal mass;  with perirectal lymphadenopathy; also 2 indeterminate liver lesions up to 15 mm in size.  #Mild anemia-suspect iron deficiency  Recommendations:   #Recommend MRI of the liver with and without contrast for further evaluation of the liver lesions.  Also plan MRI of the pelvis/rectal protocol.  Agree with enema as per radiology.  #Check iron studies; ferritin.  Also check CEA  #Discussed with the patient that we will likely need aggressive chemotherapy radiation upfront; followed by surgery.  However the details cannot be shared at this time given above pending work-up.   #Patient lives in [place]; [place]  m[place] away from [facility].  We will make a referral to GI oncology at [facility].  I have informed Adele Schilder, GI nurse navigator.  Also discussed with Drs.Shah/T[place]  Thank you Dr. Sherryll Burger or allowing me to participate in the care of your pleasant patient. Please do not hesitate to contact me with questions or concerns in the interim.  All questions were answered. The patient knows to call the clinic with any problems, questions or concerns.  The above plan of care was also discussed with the patient/fianc by the bedside.  # I reviewed the blood work- with the patient in detail; also reviewed the imaging independently [as summarized above]; and with the patient in detail.     Earna Coder, MD 04/17/2021 1:59 PM

## 2021-04-17 NOTE — Progress Notes (Signed)
?  Progress Note ? ? ?Patient: Jeremiah Allen BJY:[id number] DOB: Jun 08, 1985 DOA: 04/16/2021     1 ?DOS: the patient was seen and examined on 04/17/2021 ?  ?Brief hospital course: ?36 year old male with a history of bipolar disorder admitted for rectal bleeding  ? ?3/15: Flexible sigmoidoscopy showed rectal mass of 4 cm worrisome for malignancy ?3/16: Oncology consult -further work-up including MRI of the liver, pelvis and blood work in progress ? ? ?Assessment and Plan: ?* Rectal bleeding ?Appreciate GI performing flexible sigmoidoscopy yesterday showing 4 cm rectal mass worrisome for malignancy.  Biopsy results pending ?I have requested oncology consult.  Dr. Rogue Bussing seen and getting further work-up including MRI of the pelvis, liver and blood work ?Cancer center is coordinating outpatient management at discharge ? ?Sciatica ?Continue gabapentin. ? ?Bipolar 1 disorder ([place]) ?Continue Abilify and hydroxyzine. ? ?Nicotine dependence ?Smoking cessation discussed with patient in detail ?He declines a nicotine transdermal patch at this time. ? ? ? ? ?  ? ?Subjective: Patient would like to eat.  Concerned about his rectal mass but would like to get care started as he is worried about potential malignancy and any delays ? ?Physical Exam: ?Vitals:  ? 04/16/21 1740 04/16/21 1947 04/17/21 0518 04/17/21 0739  ?BP: 115/81 116/75 103/61 115/68  ?Pulse: (!) 55 (!) 57 61 64  ?Resp: '18 19 19 18  '$ ?Temp: 97.9 ?F (36.6 ?C) 97.6 ?F (36.4 ?C) 98 ?F (36.7 ?C) 98.1 ?F (36.7 ?C)  ?TempSrc:    Oral  ?SpO2: 99% 100% 100% 100%  ?Weight:      ?Height:      ? ?36 year old male lying in the bed comfortably without any acute distress ?Eyes pupil equal round reactive to high and accommodation, no scleral icterus EXTR ocular muscles intact ?Lungs clear to auscultation bilaterally, no wheezing rales rhonchi or crepitation ?Cardiovascular S1-S2 normal, no murmur rubs or gallop ?Abdomen soft, benign ?Neuro alert and oriented, nonfocal ?Skin no rash or  lesion ?Psych normal mood and affect ?Data Reviewed: ? ?Hemoglobin 11.1, iron 35 ? ?Family Communication: Girlfriend at bedside ? ?Disposition: ?Status is: Inpatient ?Remains inpatient appropriate because: Getting rectal mass evaluation for possible malignancy ? ? Planned Discharge Destination: Home ? ? ? DVT prophylaxis-SCDs ?Time spent: 35 minutes ? ?Author: ?Max Sane, MD ?04/17/2021 10:58 AM ? ?For on call review [email or web address].  ?

## 2021-04-17 NOTE — Hospital Course (Signed)
36 year old male with a history of bipolar disorder admitted for rectal bleeding  ? ?3/15: Flexible sigmoidoscopy showed rectal mass of 4 cm worrisome for malignancy ?3/16: Oncology consult -further work-up including MRI of the liver, pelvis and blood work in progress ?

## 2021-04-17 NOTE — Telephone Encounter (Signed)
LVM to schedule consult with Dr. Moody ?

## 2021-04-18 ENCOUNTER — Telehealth: Payer: Self-pay | Admitting: Radiation Oncology

## 2021-04-18 ENCOUNTER — Encounter: Payer: Self-pay | Admitting: Internal Medicine

## 2021-04-18 ENCOUNTER — Inpatient Hospital Stay: Payer: Self-pay

## 2021-04-18 ENCOUNTER — Encounter: Payer: Self-pay | Admitting: *Deleted

## 2021-04-18 DIAGNOSIS — R1084 Generalized abdominal pain: Principal | ICD-10-CM

## 2021-04-18 DIAGNOSIS — C19 Malignant neoplasm of rectosigmoid junction: Secondary | ICD-10-CM

## 2021-04-18 DIAGNOSIS — C20 Malignant neoplasm of rectum: Secondary | ICD-10-CM

## 2021-04-18 DIAGNOSIS — K922 Gastrointestinal hemorrhage, unspecified: Secondary | ICD-10-CM

## 2021-04-18 LAB — CEA: CEA: 14.5 ng/mL — ABNORMAL HIGH (ref 0.0–4.7)

## 2021-04-18 LAB — SURGICAL PATHOLOGY

## 2021-04-18 MED ORDER — IOHEXOL 300 MG/ML  SOLN
75.0000 mL | Freq: Once | INTRAMUSCULAR | Status: AC | PRN
  Administered 2021-04-18: 75 mL via INTRAVENOUS

## 2021-04-18 MED ORDER — LEVOFLOXACIN 500 MG PO TABS
500.0000 mg | ORAL_TABLET | Freq: Every day | ORAL | 0 refills | Status: AC
Start: 2021-04-18 — End: 2021-04-20

## 2021-04-18 NOTE — Progress Notes (Signed)
PATIENT NAVIGATOR PROGRESS NOTE ? ?Name: Jeremiah Allen ?Date: 04/18/2021 ?MRN: [id number]  ?DOB: 10/01/1985 ? ? ?Reason for visit:  ?Introductory phone call ? ?Comments:  Spoke with pt who is currently inpatient at [facility] and introduced myself as his Oncology Navigator. He has New Patient appt with Dr Benay Spice on Monday, March 20 at 1:40. Directions to building and parking given, one support person and mask policy reviewed. ? ?Gave contact information to call with any questions. Verbalized understanding ? ? ? ?Time spent counseling/coordinating care: 15-30 minutes ? ?

## 2021-04-18 NOTE — Discharge Summary (Signed)
?Physician Discharge Summary ?  ?Patient: Jeremiah Allen MRN: [id number] DOB: 1985/03/31  ?Admit date:     04/16/2021  ?Discharge date: 04/18/21  ?Discharge Physician: Max Sane  ? ?PCP: System, Provider Not In  ? ?Recommendations at discharge:  ? ?Follow-up with Dr Benay Spice on Monday, March 20 at 1:40 p.m. ? ?Discharge Diagnoses: ?Principal Problem: ?  Rectal bleeding ?Active Problems: ?  Nicotine dependence ?  Bipolar 1 disorder ([place]) ?  Sciatica ?  Colorectal cancer ([place]) ?  Generalized abdominal pain ?  Lower GI bleeding ? ? ?Hospital Course: ?36 year old male with a history of bipolar disorder admitted for rectal bleeding  ? ?3/15: Flexible sigmoidoscopy showed rectal mass of 4 cm worrisome for malignancy ?3/16: Oncology consult -further work-up including MRI of the liver, pelvis and blood work in progress ? 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  ?Flexible sigmoidoscopy on 3/15 showed 4 cm rectal mass worrisome for malignancy.  Biopsy results confirmed Moderately differentiated adenocarcinoma.  ?Dr. Brahmanday/oncology seen ?MRI of the pelvis, liver showed Metastatic disease to the RIGHT hepatic lobe with 3 discrete ?lesions ?Further cancer treatment as an outpatient coordinated by nurse navigator of Dr. Rogue Bussing ? ?Sciatica ?Continue gabapentin. ? ?Bipolar 1 disorder ([place]) ?Continue Abilify and hydroxyzine. ? ?Nicotine dependence ?Smoking cessation discussed with patient in detail ?He declines a nicotine transdermal patch at this time. ? ? ? ? ?  ? ? ?Consultants: GI, oncology ?Procedures performed: Flexible sigmoidoscopy on 3/15 ?Disposition: Home ?Diet recommendation:  ?Discharge Diet Orders (From admission, onward)  ? ?  Start     Ordered  ? 04/18/21 0000  Diet - low sodium heart healthy       ? 04/18/21 1022  ? ?  ?  ? ?  ? ?Regular diet ?DISCHARGE MEDICATION: ?Allergies as of 04/18/2021   ?No Known Allergies ?  ? ?   ?Medication List  ?  ? ?STOP taking these medications   ? ?ibuprofen 200 MG tablet ?Commonly known as: ADVIL ?  ? ?  ? ?TAKE these medications   ? ?acetaminophen 500 MG tablet ?Commonly known as: TYLENOL ?Take 500-1,000 mg by mouth every 6 (six) hours as needed for mild pain or moderate pain. ?  ?ARIPiprazole 2 MG tablet ?Commonly known as: ABILIFY ?Take 2 mg by mouth daily. ?  ?gabapentin 300 MG capsule ?Commonly known as: NEURONTIN ?Take 300 mg by mouth 2 (two) times daily. ?  ?hydrOXYzine 10 MG tablet ?Commonly known as: ATARAX ?Take 10 mg by mouth 3 (three) times daily as needed for anxiety. ?  ?levofloxacin 500 MG tablet ?Commonly known as: LEVAQUIN ?Take 1 tablet (500 mg total) by mouth daily for 2 days. ?  ?sucralfate 1 g tablet ?Commonly known as: CARAFATE ?Take 1 g by mouth 2 (two) times daily. ?  ? ?  ? ? Follow-up Information   ? ? Lin Landsman, MD. Schedule an appointment as soon as possible for a visit .   ?Specialty: Gastroenterology ?Contact information: ?[place] [address] ?[phone] ? ? ?  ?  ? ? Ladell Pier, MD Follow up on 04/21/2021.   ?Specialty: Oncology ?Why: 1:40 pm as scheduled ?Contact information: ?[address] ?[place] [address] ?[phone] ? ? ?  ?  ? ? Lin Landsman, MD .   ?Specialty: Gastroenterology ?Contact information: ?[place] [address] ?[phone] ? ? ?  ?  ? ?  ?  ? ?  ? ?  Discharge Exam: ?Filed Weights  ? 04/16/21 0527 04/16/21 1541  ?Weight: 68 kg 19 kg  ? ?36 year old male lying in the bed comfortably without any acute distress ?Eyes pupil equal round reactive to high and accommodation, no scleral icterus EXTR ocular muscles intact ?Lungs clear to auscultation bilaterally, no wheezing rales rhonchi or crepitation ?Cardiovascular S1-S2 normal, no murmur rubs or gallop ?Abdomen soft, benign ?Neuro alert and oriented, nonfocal ?Skin no rash or lesion ?Psych normal mood and affect ? ?Condition  at discharge: good ? ?The results of significant diagnostics from this hospitalization (including imaging, microbiology, ancillary and laboratory) are listed below for reference.  ? ?Imaging Studies: ?CT CHEST W CONTRAST ? ?Result Date: 04/18/2021 ?CLINICAL DATA:  Staging for colon carcinoma. EXAM: CT CHEST WITH CONTRAST TECHNIQUE: Multidetector CT imaging of the chest was performed during intravenous contrast administration. RADIATION DOSE REDUCTION: This exam was performed according to the departmental dose-optimization program which includes automated exposure control, adjustment of the mA and/or kV according to patient size and/or use of iterative reconstruction technique. CONTRAST:  2m OMNIPAQUE IOHEXOL 300 MG/ML  SOLN COMPARISON:  04/16/2021, CTA abdomen and pelvis it is. CTA chest, 08/15/2020. FINDINGS: Cardiovascular: Heart is normal in size and configuration. No pericardial effusion. Normal great vessels. Mediastinum/Nodes: No enlarged mediastinal, hilar, or axillary lymph nodes. Thyroid gland, trachea, and esophagus demonstrate no significant findings. Lungs/Pleura: Clear lungs. No lung masses or nodules. No pleural effusion or pneumothorax. Upper Abdomen: Subtle liver lesions, segment 6 and 7, unchanged from the previous day's abdomen MRI. No acute findings. Musculoskeletal: No fracture or acute finding. No osteoblastic or osteolytic lesions. No chest wall masses. IMPRESSION: 1. No abnormality of the chest. Specifically, no evidence metastatic disease. 2. Liver lesions as better visualized on the previous day's abdomen MRI. Electronically Signed   By: DLajean ManesM.D.   On: 04/18/2021 10:20  ? ?MR LIVER W WO CONTRAST ? ?Result Date: 04/17/2021 ?CLINICAL DATA:  A 36year old male presents for evaluation of hepatic lesions seen on recent CT imaging in the setting of rectal neoplasm. EXAM: MRI ABDOMEN WITHOUT AND WITH CONTRAST TECHNIQUE: Multiplanar multisequence MR imaging of the abdomen was performed  both before and after the administration of intravenous contrast. CONTRAST:  651mGADAVIST GADOBUTROL 1 MMOL/ML IV SOLN COMPARISON:  CT a of the chest, abdomen and pelvis dated April 16, 2021 FINDINGS: Lower chest: Incidental imaging of the lung bases unremarkable on MRI. Hepatobiliary: No substantial fat or iron in the liver. Three subtle lesions in the liver showing rim like enhancement and central hypoenhancement largest in the posterior RIGHT hepatic lobe measuring approximately 1.9 x 1.7 cm (image 31/15). This is in hepatic subsegment VII. Second lesion in hepatic subsegment VIII (image 15/15) 14 mm greatest axial dimension. Third lesion along the margin of the inferior RIGHT hemiliver, hepatic subsegment VI (image 56/15) 11 mm. No additional hepatic lesions. No biliary duct distension or pericholecystic stranding. Pancreas: Normal intrinsic T1 signal. No ductal dilation or sign of inflammation. No focal lesion. Spleen:  Normal. Adrenals/Urinary Tract:  Adrenal glands are normal. Small cyst in the RIGHT kidney. Kidneys enhance symmetrically without signs of hydronephrosis or perinephric stranding. Stomach/Bowel: No acute gastrointestinal process to the extent evaluated on the abdominal portion of the MRI. See pelvic MRI reported separately for rectal cancer evaluation. Vascular/Lymphatic:  No retroperitoneal adenopathy. Other:  No ascites Musculoskeletal: No suspicious bone lesions identified. IMPRESSION: 1. Metastatic disease to the RIGHT hepatic lobe with 3 discrete lesions as described. 2. See pelvic MRI reported separately  for rectal cancer evaluation. Electronically Signed   By: Zetta Bills M.D.   On: 04/17/2021 16:18  ? ?MR PELVIS WO CM RECTAL CA STAGING ? ?Result Date: 04/17/2021 ?CLINICAL DATA:  History of rectal mass in a 36 year old male. EXAM: MRI PELVIS WITHOUT CONTRAST TECHNIQUE: Multiplanar multisequence MR imaging of the pelvis was performed. No intravenous contrast was administered. Ultrasound  gel was administered per rectum to optimize tumor evaluation. COMPARISON:  Comparison is made with CT angiography of the chest, abdomen and pelvis of April 16, 2021 and MRI of the abdomen of April 17, 2021. FINDINGS

## 2021-04-18 NOTE — Telephone Encounter (Signed)
LVM to schedule consult next available with Dr. [name] ?

## 2021-04-18 NOTE — Progress Notes (Signed)
Jeremiah Allen   DOB:04-01-1985   MW#:[id number]   ? ?Subjective: Patient sitting in the bed having breakfast.  Denies any worsening abdominal pain.  Any blood in stools.  No nausea no vomiting.  Accompanied by his fianc?e. ? ?Objective:  ?Vitals:  ? 04/18/21 0639 04/18/21 0806  ?BP: 104/66 117/66  ?Pulse: (!) 56 (!) 53  ?Resp: 18 16  ?Temp: 97.9 ?F (36.6 ?C) 97.9 ?F (36.6 ?C)  ?SpO2: 99% 100%  ?  ? ?Intake/Output Summary (Last 24 hours) at 04/18/2021 2256 ?Last data filed at 04/18/2021 1100 ?Gross per 24 hour  ?Intake 1598.39 ml  ?Output --  ?Net 1598.39 ml  ? ? ?Physical Exam ?Vitals and nursing note reviewed.  ?HENT:  ?   Head: Normocephalic and atraumatic.  ?   Mouth/Throat:  ?   Pharynx: Oropharynx is clear.  ?Eyes:  ?   Extraocular Movements: Extraocular movements intact.  ?   Pupils: Pupils are equal, round, and reactive to light.  ?Cardiovascular:  ?   Rate and Rhythm: Normal rate and regular rhythm.  ?Pulmonary:  ?   Comments: Decreased breath sounds bilaterally.  ?Abdominal:  ?   Palpations: Abdomen is soft.  ?Musculoskeletal:     ?   General: Normal range of motion.  ?   Cervical back: Normal range of motion.  ?Skin: ?   General: Skin is warm.  ?Neurological:  ?   General: No focal deficit present.  ?   Mental Status: He is alert and oriented to person, place, and time.  ?Psychiatric:     ?   Behavior: Behavior normal.     ?   Judgment: Judgment normal.  ? ?  ?Labs:  ?Lab Results  ?Component Value Date  ? WBC 4.6 04/17/2021  ? HGB 11.1 (L) 04/17/2021  ? HCT 33.6 (L) 04/17/2021  ? MCV 91.1 04/17/2021  ? PLT 248 04/17/2021  ? NEUTROABS 3.4 04/16/2021  ? ? ?Lab Results  ?Component Value Date  ? NA 139 04/17/2021  ? K 4.1 04/17/2021  ? CL 105 04/17/2021  ? CO2 28 04/17/2021  ? ? ?Studies:  ?CT CHEST W CONTRAST ? ?Result Date: 04/18/2021 ?CLINICAL DATA:  Staging for colon carcinoma. EXAM: CT CHEST WITH CONTRAST TECHNIQUE: Multidetector CT imaging of the chest was performed during intravenous contrast  administration. RADIATION DOSE REDUCTION: This exam was performed according to the departmental dose-optimization program which includes automated exposure control, adjustment of the mA and/or kV according to patient size and/or use of iterative reconstruction technique. CONTRAST:  76m OMNIPAQUE IOHEXOL 300 MG/ML  SOLN COMPARISON:  04/16/2021, CTA abdomen and pelvis it is. CTA chest, 08/15/2020. FINDINGS: Cardiovascular: Heart is normal in size and configuration. No pericardial effusion. Normal great vessels. Mediastinum/Nodes: No enlarged mediastinal, hilar, or axillary lymph nodes. Thyroid gland, trachea, and esophagus demonstrate no significant findings. Lungs/Pleura: Clear lungs. No lung masses or nodules. No pleural effusion or pneumothorax. Upper Abdomen: Subtle liver lesions, segment 6 and 7, unchanged from the previous day's abdomen MRI. No acute findings. Musculoskeletal: No fracture or acute finding. No osteoblastic or osteolytic lesions. No chest wall masses. IMPRESSION: 1. No abnormality of the chest. Specifically, no evidence metastatic disease. 2. Liver lesions as better visualized on the previous day's abdomen MRI. Electronically Signed   By: DLajean ManesM.D.   On: 04/18/2021 10:20  ? ?MR LIVER W WO CONTRAST ? ?Result Date: 04/17/2021 ?CLINICAL DATA:  A 36year old male presents for evaluation of hepatic lesions seen on recent CT imaging in the  setting of rectal neoplasm. EXAM: MRI ABDOMEN WITHOUT AND WITH CONTRAST TECHNIQUE: Multiplanar multisequence MR imaging of the abdomen was performed both before and after the administration of intravenous contrast. CONTRAST:  39m GADAVIST GADOBUTROL 1 MMOL/ML IV SOLN COMPARISON:  CT a of the chest, abdomen and pelvis dated April 16, 2021 FINDINGS: Lower chest: Incidental imaging of the lung bases unremarkable on MRI. Hepatobiliary: No substantial fat or iron in the liver. Three subtle lesions in the liver showing rim like enhancement and central  hypoenhancement largest in the posterior RIGHT hepatic lobe measuring approximately 1.9 x 1.7 cm (image 31/15). This is in hepatic subsegment VII. Second lesion in hepatic subsegment VIII (image 15/15) 14 mm greatest axial dimension. Third lesion along the margin of the inferior RIGHT hemiliver, hepatic subsegment VI (image 56/15) 11 mm. No additional hepatic lesions. No biliary duct distension or pericholecystic stranding. Pancreas: Normal intrinsic T1 signal. No ductal dilation or sign of inflammation. No focal lesion. Spleen:  Normal. Adrenals/Urinary Tract:  Adrenal glands are normal. Small cyst in the RIGHT kidney. Kidneys enhance symmetrically without signs of hydronephrosis or perinephric stranding. Stomach/Bowel: No acute gastrointestinal process to the extent evaluated on the abdominal portion of the MRI. See pelvic MRI reported separately for rectal cancer evaluation. Vascular/Lymphatic:  No retroperitoneal adenopathy. Other:  No ascites Musculoskeletal: No suspicious bone lesions identified. IMPRESSION: 1. Metastatic disease to the RIGHT hepatic lobe with 3 discrete lesions as described. 2. See pelvic MRI reported separately for rectal cancer evaluation. Electronically Signed   By: GZetta BillsM.D.   On: 04/17/2021 16:18  ? ?MR PELVIS WO CM RECTAL CA STAGING ? ?Result Date: 04/17/2021 ?CLINICAL DATA:  History of rectal mass in a 36year old male. EXAM: MRI PELVIS WITHOUT CONTRAST TECHNIQUE: Multiplanar multisequence MR imaging of the pelvis was performed. No intravenous contrast was administered. Ultrasound gel was administered per rectum to optimize tumor evaluation. COMPARISON:  Comparison is made with CT angiography of the chest, abdomen and pelvis of April 16, 2021 and MRI of the abdomen of April 17, 2021. FINDINGS: TUMOR LOCATION Tumor distance from Anal Verge/Skin Surface:  10 cm Tumor distance to Internal Anal Sphincter: 6 cm TUMOR DESCRIPTION Circumferential Extent: Circumferential thickening of  the mid to high rectum with nodular features more pronounced along the RIGHT posterolateral aspect. Tumor Length: 4.8 cm cm T - CATEGORY Extension through [employer]: 10 mm extension beyond the rectal wall and mucosal air (image 9/8). However, on image 22 of series 3 there is involvement of the LEFT anterior peritoneal reflection. Shortest Distance of any tumor/node from Mesorectal Fascia: 2 mm with a lymph node measuring 8 mm adjacent to the mesorectal fascia along the RIGHT posterolateral high mesorectum. Extramural Vascular Invasion/Tumor Thrombus: Yes Invasion of Anterior Peritoneal Reflection: Yes as above. Involvement of Adjacent Organs or Pelvic Sidewall: No Levator Ani Involvement: No N - CATEGORY Mesorectal Lymph Nodes >=51m Numerous mesorectal lymph nodes compatible with N2 disease. Within the pelvis no definitive signs of extra mesorectal adenopathy. Greater than 10 lymph nodes in the mesorectum tracking along superior rectal vein. Extra-mesorectal Lymphadenopathy: No signs of extra mesorectal adenopathy in the pelvis. Other:  No ascites. 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 Electronically Signed   By: GeZetta Bills.D.   On: 04/17/2021 16:0255 ? ? ?#[id number] old with no significant medical history is admitted to hospital for lower GI bleed/noted to have rectal mass concerning for malignancy. ?  ?#Rectal mass-partially obstructing-highly  concerning for malignancy.  S/p colonoscopy/biopsy results pending.  CT scan shows-rectal mass; with perirectal lymphadenopathy; also 2 indeterminate liver lesions up to 15 mm in size.  MRI liver 3 subtle lesions; also MRI rectum- T4aN2; CEA elevated. ? ?#I reviewed with the patient and his fianc?e that based on imaging concerning for stage IV disease.  However a only small minority of patients could potentially be cured with aggressive therapy.  I reviewed with the patient/fianc? - that  will likely need aggressive chemotherapy; followed by chemoradiation upfront; followed by surgery; if noted to have good response.  Patient is awaiting medical oncology evaluation at [facility]  on 3/23 with Dr.Feng. A

## 2021-04-18 NOTE — Progress Notes (Signed)
Received MD order to discharge patient tp home reviewed discharge instructions, homes meds, prescriptions and follow up appointments with patient and patient verbalized understanding  ?

## 2021-04-19 ENCOUNTER — Telehealth: Payer: Self-pay | Admitting: Internal Medicine

## 2021-04-19 NOTE — Telephone Encounter (Signed)
Spoke to patient regarding results of the biopsy positive for malignancy.  Patient has follow-up in [place]-[facility] for treatment planning. ? ?GB ?

## 2021-04-21 ENCOUNTER — Telehealth: Payer: Self-pay | Admitting: Radiation Oncology

## 2021-04-21 ENCOUNTER — Other Ambulatory Visit: Payer: Self-pay | Admitting: *Deleted

## 2021-04-21 ENCOUNTER — Other Ambulatory Visit: Payer: Self-pay

## 2021-04-21 ENCOUNTER — Inpatient Hospital Stay: Payer: Self-pay | Attending: Oncology | Admitting: Oncology

## 2021-04-21 ENCOUNTER — Encounter: Payer: Self-pay | Admitting: *Deleted

## 2021-04-21 VITALS — BP 119/74 | HR 100 | Temp 98.1°F | Resp 18 | Ht 69.0 in | Wt 141.4 lb

## 2021-04-21 DIAGNOSIS — C20 Malignant neoplasm of rectum: Secondary | ICD-10-CM | POA: Insufficient documentation

## 2021-04-21 DIAGNOSIS — K6289 Other specified diseases of anus and rectum: Secondary | ICD-10-CM

## 2021-04-21 DIAGNOSIS — F319 Bipolar disorder, unspecified: Secondary | ICD-10-CM

## 2021-04-21 DIAGNOSIS — G893 Neoplasm related pain (acute) (chronic): Secondary | ICD-10-CM

## 2021-04-21 DIAGNOSIS — Z5111 Encounter for antineoplastic chemotherapy: Secondary | ICD-10-CM | POA: Insufficient documentation

## 2021-04-21 DIAGNOSIS — F17210 Nicotine dependence, cigarettes, uncomplicated: Secondary | ICD-10-CM

## 2021-04-21 DIAGNOSIS — C787 Secondary malignant neoplasm of liver and intrahepatic bile duct: Secondary | ICD-10-CM | POA: Insufficient documentation

## 2021-04-21 DIAGNOSIS — G8929 Other chronic pain: Secondary | ICD-10-CM

## 2021-04-21 DIAGNOSIS — M5432 Sciatica, left side: Secondary | ICD-10-CM

## 2021-04-21 MED ORDER — GABAPENTIN 300 MG PO CAPS: 300.0000 mg | ORAL_CAPSULE | Freq: Three times a day (TID) | ORAL | 0 refills | Status: DC

## 2021-04-21 NOTE — Progress Notes (Signed)
START ON PATHWAY REGIMEN - Colorectal     A cycle is every 14 days:     Oxaliplatin      Leucovorin      Fluorouracil      Fluorouracil   **Always confirm dose/schedule in your pharmacy ordering system**  Patient Characteristics: Distant Metastases, Resectable, Neoadjuvant Therapy Planned Tumor Location: Rectal Therapeutic Status: Distant Metastases  Intent of Therapy: Curative Intent, Discussed with Patient 

## 2021-04-21 NOTE — Telephone Encounter (Signed)
LVM to schedule consult with Dr. Moody ?

## 2021-04-21 NOTE — Progress Notes (Signed)
GI Location of Tumor / Histology: Rectal Cancer ? ?Jeremiah Allen presented to the emergency room with complaints of lower abdominal pain, rectal bleeding, and changes in bowel habits.  He reports these symptoms started about 2 months ago. ? ?CT Chest 04/18/2021: No abnormality of the chest. Specifically, no evidence metastatic disease. ? 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 ? ?MRI Liver 04/17/2021: Three subtle lesions in the liver showing rim like enhancement and central hypo-enhancement largest in the posterior RIGHT hepatic lobe measuring approximately 1.9 x 1.7 cm. ? ?Sigmoidoscopy 04/16/2021: ? ?CT Angio Abdomen 04/16/2021: Bulky and irregular rectal wall thickening up to 16 mm with perirectal lymphadenopathy, and two indeterminate liver lesions.  While this might be an inflammatory proctitis, cannot exclude Rectal Carcinoma with Liver Metastases. ? ? ?Biopsies of Rectal Mass 04/16/2021 ? ? ? ?Past/Anticipated interventions by surgeon, if any:  ?[facility] ?-Liver biopsy 04/28/2021 ? ? ?Past/Anticipated interventions by medical oncology, if any:  ?Dr. Benay Spice 04/21/2021 ?-We will submit the rectal biopsy for mismatch repair protein testing to see whether he may be a candidate for immunotherapy. ?-If he is not an immunotherapy candidate I recommend proceeding with neoadjuvant chemotherapy.  I recommend FOLFOX.  ?-Jeremiah Allen will be referred for urgent Port-A-Cath placement with the plan to begin FOLFOX on 04/30/2021 ? ? ? ?Weight changes, if any: Lost about 25 pounds over 3 months. ? ?Bowel/Bladder complaints, if any: Loose to runny stools.  He reports increased gas.  He reports increased urgency. ? ?Nausea / Vomiting, if any: He notes some nausea after eating salads. ? ?Pain issues, if any:  Having some sciatic pains, taking gabapentin.  He reports sharp stabbing pain for about 15 minutes after eating food.  Still able  to drink liquids without difficulty.  He reports some pain and discomfort with sitting for prolonged periods. ? ?Any blood per rectum: Has occasional "red blood" in his stool.   ? ?SAFETY ISSUES: ?Prior radiation? No ?Pacemaker/ICD? No ?Possible current pregnancy? N/a ?Is the patient on methotrexate? No ? ?Current Complaints/Details: ?Port Insertion 04/28/2021 ? ?

## 2021-04-21 NOTE — Progress Notes (Signed)
?Radiation Oncology         [phone] ?________________________________ ? ?Name: Jeremiah Allen        MRN: [id number]  ?Date of Service: 04/22/2021 DOB: April 30, 1985 ? ?SA:YTKZSW, Provider Not In    ? ?REFERRING PHYSICIAN: Dr. Benay Spice ? ? ?DIAGNOSIS: The primary encounter diagnosis was Rectal cancer ([place]). A diagnosis of Metastasis to liver [facility]) was also pertinent to this visit. ? ? ?HISTORY OF PRESENT ILLNESS: Jeremiah Allen is a 36 y.o. male seen at the request of Dr. Benay Spice for a new diagnosis of rectal cancer.  The patient presented with low abdominal pain rectal bleeding and changes in bowel habits and was seen in the emergency department.  His pain began approximately 2 months prior to his evaluation.  His work-up at the hospital included CT angiography of the abdomen showing bulky irregular wall thickening of the rectum with perirectal lymphadenopathy, 2 indeterminate liver lesions and sigmoidoscopy on 04/16/2021 showed an invasive appearing rectal mass.  Biopsy of this was sent off and was consistent with moderately differentiated adenocarcinoma.  MRI of the pelvis and liver on 04/17/21 showed T4aN2 rectal involvement with metastatic appearing disease in the right hepatic lobe with 3 discrete lesions measuring up to 1.9 cm, 1.4 cm, and 1.1 cm.  His tumor in the pelvis measured 4.8 cm, it was circumferential and was 10 cm from the anal verge and 6 cm from the anal sphincter.  CT of the chest with contrast was performed on 04/18/2021 and no metastatic appearing disease was present.  He is here to discuss treatment recommendations of his cancer. ? ? ? ?PREVIOUS RADIATION THERAPY: No ? ? ?PAST MEDICAL HISTORY: No past medical history on file.   ? ? ?PAST SURGICAL HISTORY: ?Past Surgical History:  ?Procedure Laterality Date  ? FLEXIBLE SIGMOIDOSCOPY N/A 04/16/2021  ? Procedure: FLEXIBLE SIGMOIDOSCOPY;  Surgeon: Toledo, Benay Pike, MD;  Location: [facility] ENDOSCOPY;  Service: Gastroenterology;  Laterality: N/A;   ? ? ? ?FAMILY HISTORY:  ?Family History  ?Problem Relation Age of Onset  ? Heart disease Mother   ? Miscarriages / Stillbirths Sister   ? Asthma Sister   ? Stroke Sister   ?     at birth  ? Developmental delay Brother   ? ? ? ?SOCIAL HISTORY:  reports that he has been smoking cigarettes. He does not have any smokeless tobacco history on file. He reports that he does not drink alcohol and does not use drugs. The patient is single but in a relationship and engaged. He works as a Cabin crew.  ? ? ?ALLERGIES: Patient has no known allergies. ? ? ?MEDICATIONS:  ?Current Outpatient Medications  ?Medication Sig Dispense Refill  ? acetaminophen (TYLENOL) 500 MG tablet Take 500-1,000 mg by mouth every 6 (six) hours as needed for mild pain or moderate pain.    ? ARIPiprazole (ABILIFY) 2 MG tablet Take 2 mg by mouth daily.    ? gabapentin (NEURONTIN) 300 MG capsule Take 1 capsule (300 mg total) by mouth 3 (three) times daily. 90 capsule 0  ? hydrOXYzine (ATARAX) 10 MG tablet Take 10 mg by mouth 3 (three) times daily as needed for anxiety.    ? sucralfate (CARAFATE) 1 g tablet Take 1 g by mouth 2 (two) times daily.    ? ?No current facility-administered medications for this encounter.  ? ? ? ?REVIEW OF SYSTEMS: On review of systems, the patient reports that he has fecal urgency sometimes gas or mucous at least 5 times  a day, sometimes more than that, and reports on average 2 stools per day. He denies any rectal bleeding since his sigmoidoscopy. He reports some discomfort after sitting for long periods and at times initiating a urinary stream causes some pressure. He has not had any pain during intimacy or with ejaculation. No other complaints are noted.    ? ?  ? ?PHYSICAL EXAM:  ?Wt Readings from Last 3 Encounters:  ?04/22/21 142 lb 2 oz (64.5 kg)  ?04/21/21 141 lb 6.4 oz (64.1 kg)  ?04/16/21 150 lb (68 kg)  ? ?Temp Readings from Last 3 Encounters:  ?04/22/21 (!) 97.3 ?F (36.3 ?C) (Temporal)  ?04/21/21  98.1 ?F (36.7 ?C)  ?04/18/21 97.9 ?F (36.6 ?C) (Oral)  ? ?BP Readings from Last 3 Encounters:  ?04/22/21 115/69  ?04/21/21 119/74  ?04/18/21 117/66  ? ?Pulse Readings from Last 3 Encounters:  ?04/22/21 84  ?04/21/21 100  ?04/18/21 (!) 53  ? ?Pain Assessment ?Pain Score: 6  ?Pain Loc: Leg/10 ? ?In general this is a well appearing caucasian male in no acute distress. He's alert and oriented x4 and appropriate throughout the examination. Cardiopulmonary assessment is negative for acute distress and he exhibits normal effort.  ? ? ? ?ECOG = 1 ? ?0 - Asymptomatic (Fully active, able to carry on all predisease activities without restriction) ? ?1 - Symptomatic but completely ambulatory (Restricted in physically strenuous activity but ambulatory and able to carry out work of a light or sedentary nature. For example, light housework, office work) ? ?2 - Symptomatic, <50% in bed during the day (Ambulatory and capable of all self care but unable to carry out any work activities. Up and about more than 50% of waking hours) ? ?3 - Symptomatic, >50% in bed, but not bedbound (Capable of only limited self-care, confined to bed or chair 50% or more of waking hours) ? ?4 - Bedbound (Completely disabled. Cannot carry on any self-care. Totally confined to bed or chair) ? ?5 - Death ? ? Oken MM, Creech RH, Tormey DC, et al. [phone]). "Toxicity and response criteria of the [facility]". [place] Oncol. 5 (6): 649-55 ? ? ? ?LABORATORY DATA:  ?Lab Results  ?Component Value Date  ? WBC 4.6 04/17/2021  ? HGB 11.1 (L) 04/17/2021  ? HCT 33.6 (L) 04/17/2021  ? MCV 91.1 04/17/2021  ? PLT 248 04/17/2021  ? ?Lab Results  ?Component Value Date  ? NA 139 04/17/2021  ? K 4.1 04/17/2021  ? CL 105 04/17/2021  ? CO2 28 04/17/2021  ? ?Lab Results  ?Component Value Date  ? ALT 14 04/16/2021  ? AST 19 04/16/2021  ? ALKPHOS 72 04/16/2021  ? BILITOT 0.5 04/16/2021  ? ?  ? ?RADIOGRAPHY: CT CHEST W CONTRAST ? ?Result Date:  04/18/2021 ?CLINICAL DATA:  Staging for colon carcinoma. EXAM: CT CHEST WITH CONTRAST TECHNIQUE: Multidetector CT imaging of the chest was performed during intravenous contrast administration. RADIATION DOSE REDUCTION: This exam was performed according to the departmental dose-optimization program which includes automated exposure control, adjustment of the mA and/or kV according to patient size and/or use of iterative reconstruction technique. CONTRAST:  13m OMNIPAQUE IOHEXOL 300 MG/ML  SOLN COMPARISON:  04/16/2021, CTA abdomen and pelvis it is. CTA chest, 08/15/2020. FINDINGS: Cardiovascular: Heart is normal in size and configuration. No pericardial effusion. Normal great vessels. Mediastinum/Nodes: No enlarged mediastinal, hilar, or axillary lymph nodes. Thyroid gland, trachea, and esophagus demonstrate no significant findings. Lungs/Pleura: Clear lungs. No lung masses or  nodules. No pleural effusion or pneumothorax. Upper Abdomen: Subtle liver lesions, segment 6 and 7, unchanged from the previous day's abdomen MRI. No acute findings. Musculoskeletal: No fracture or acute finding. No osteoblastic or osteolytic lesions. No chest wall masses. IMPRESSION: 1. No abnormality of the chest. Specifically, no evidence metastatic disease. 2. Liver lesions as better visualized on the previous day's abdomen MRI. Electronically Signed   By: Lajean Manes M.D.   On: 04/18/2021 10:20  ? ?MR LIVER W WO CONTRAST ? ?Result Date: 04/17/2021 ?CLINICAL DATA:  A 36 year old male presents for evaluation of hepatic lesions seen on recent CT imaging in the setting of rectal neoplasm. EXAM: MRI ABDOMEN WITHOUT AND WITH CONTRAST TECHNIQUE: Multiplanar multisequence MR imaging of the abdomen was performed both before and after the administration of intravenous contrast. CONTRAST:  42m GADAVIST GADOBUTROL 1 MMOL/ML IV SOLN COMPARISON:  CT a of the chest, abdomen and pelvis dated April 16, 2021 FINDINGS: Lower chest: Incidental imaging of the  lung bases unremarkable on MRI. Hepatobiliary: No substantial fat or iron in the liver. Three subtle lesions in the liver showing rim like enhancement and central hypoenhancement largest in the posterior RIGHT hepati

## 2021-04-21 NOTE — Progress Notes (Signed)
?[facility] ?New Patient Consult ? ? ?Requesting MD: Charlaine Dalton ? ? ? ?Eshawn Coor ?36 y.o.  ?July 27, 1985 ? ?  ?Reason for Consult: Rectal cancer ? ? ?HPI: Mr. Jeremiah Allen reports intermittent rectal bleeding for the past 4-5 months.  He was seen by his primary provider last month and prescribed antibiotics for presumed colitis. ?He developed pain with a bowel movement and pain in the right lower quadrant and presented to the [facility] emergency room 04/16/2021.  He was noted to have gross blood on rectal exam. ? ?A CT angiogram of the abdomen and pelvis revealed a nonspecific 15 mm hypodense area in the right lateral liver with a similar hypodense area in the posterior right liver.  Bulky irregular rectal wall thickening with associated perirectal lymphadenopathy was noted.  The wall thickening tapers to a normal appearance at the rectosigmoid junction.  Perirectal lymphadenopathy was noted. ? ?He was taken to a sigmoidoscopy by Dr. Alice Reichert on 04/16/2021.  Digital exam confirmed a rectal mass 4 cm from the anal verge.  A large mass was found in the distal rectum.  No bleeding was present.  A biopsy was obtained.  The colon was unprepped for the procedure.  Normal mucosa was found in the rectosigmoid.  The pathology from the rectum mass biopsy revealed moderately differentiated adenocarcinoma.  Lymphovascular invasion was noted. ?MRIs of the liver and pelvis were obtained on 04/17/2021.  The liver MRI confirmed 3 subtle rim-enhancing and central hypoenhancing lesions in the right liver.  The pelvic MRI confirmed a tumor at 10 cm from the anal verge in 6 cm from the internal anal sphincter.  Tumor extends beyond the rectal wall with involvement of the left anterior peritoneal reflection.  Extramural vascular invasion was noted.  Numerous mesorectal nodes within the pelvis.  Greater than 10 lymph nodes in the mesorectum.  No signs of extremities or rectal adenopathy.  The tumor was staged as a T4aN2  lesion by MRI. ?A CT of the chest 04/18/2021 revealed no evidence of metastatic disease to the chest. ? ?He was discharged in the hospital 04/18/2021.  He has noted improvement in rectal bleeding since starting a stool softener. ? 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 ?Past Surgical History:  ?Procedure Laterality Date  ? FLEXIBLE SIGMOIDOSCOPY N/A 04/16/2021  ? Procedure: FLEXIBLE SIGMOIDOSCOPY;  Surgeon: Toledo, Benay Pike, MD;  Location: [facility] ENDOSCOPY;  Service: Gastroenterology;  Laterality: N/A;  ?  .  Tonsillectomy ?  Marland Kitchen  Wisdom tooth extraction ? ?Medications: Reviewed ? ?Allergies: No Known Allergies ? ?Family history: No family history of cancer. ? ?Social History:  ? ?He lives in [place] with his fianc?e.  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  He smokes cigarettes.  Rare alcohol use.  No risk factor for HIV or hepatitis. ? ?ROS:  ? ?Positives include: Chronic pain at the sacrum and left leg-recent increase in sacral pain when sitting, rectal urgency, intermittent rectal bleeding, episode of right lower quadrant pain prior to hospital admission March 2023, urinary hesitancy, 10 pound weight loss ? ?A complete ROS was otherwise negative. ? ?Physical Exam: ? ?Blood pressure 119/74, pulse 100, temperature 98.1 ?F (36.7 ?C), resp. rate 18, height _0  (1.753 m), weight 141 lb 6.4 oz (64.1 kg), SpO2 99 %. ? ?HEENT: Oral cavity without visible mass, neck without mass ?Lungs: Clear bilaterally ?Cardiac: Regular rate and rhythm ?Abdomen: No hepatosplenomegaly,  no mass, nontender ?GU: Soft 2 cm fullness at the inferior aspect of the right testicle ?Vascular: No leg edema ?Lymph nodes: No cervical or supraclavicular nodes.  "Shotty "bilateral axillary and inguinal nodes ?Neurologic: Alert and oriented, motor exam appears intact in the upper and lower extremities bilaterally ?Skin: No rash, superficial  ulceration of the right scrotum ?Musculoskeletal: Tenderness of the low lumbar spine and sacrum ? ? ?LAB: ? ?CBC ? ?Lab Results  ?Component Value Date  ? WBC 4.6 04/17/2021  ? HGB 11.1 (L) 04/17/2021  ? HCT 33.6 (L) 04/17/2021  ? MCV 91.1 04/17/2021  ? PLT 248 04/17/2021  ? NEUTROABS 3.4 04/16/2021  ?  ? ?  ? ?CMP  ?Lab Results  ?Component Value Date  ? NA 139 04/17/2021  ? K 4.1 04/17/2021  ? CL 105 04/17/2021  ? CO2 28 04/17/2021  ? GLUCOSE 91 04/17/2021  ? BUN 6 04/17/2021  ? CREATININE 0.87 04/17/2021  ? CALCIUM 8.4 (L) 04/17/2021  ? PROT 7.7 04/16/2021  ? ALBUMIN 4.0 04/16/2021  ? AST 19 04/16/2021  ? ALT 14 04/16/2021  ? ALKPHOS 72 04/16/2021  ? BILITOT 0.5 04/16/2021  ? GFRNONAA >60 04/17/2021  ? ? ? ?Lab Results  ?Component Value Date  ? CEA1 14.5 (H) 04/17/2021  ? ? ?Imaging: ?As per HPI, CT images from 04/16/2021 and MRI from 04/17/2021 reviewed with Mr. Erby Pian ? ? ? ?Assessment/Plan:  ? ?Rectal cancer-clinical stage IV(T4aN2, M1) ?Sigmoidoscopy 04/16/2021-mass at 4 cm from the anal verge, invasive moderately differentiated adenocarcinoma with lymphovascular invasion ?CT angio abdomen/pelvis-bulky irregular rectal wall thickening with perirectal lymphadenopathy and 2 indeterminate liver lesions ?MRI liver 04/17/2021-3 discrete right liver metastases ?MRI pelvis 04/17/2021-T4aN2 rectal tumor-10 cm from the anal verge, involvement of left anterior peritoneal reflection, numerous mesorectal lymph nodes within the pelvis, no evidence of extra mesorectal adenopathy greater than 10 nodes in the mesorectum ?CT chest 04/18/2021-no evidence of metastatic disease ?Evaded CEA ?Rectal bleeding and pain secondary to #1 ?Chronic left sciatica ?Bipolar disorder ?Right testicular fullness noted on exam 04/21/2021-epididymal cyst? ? ? ?Disposition:  ? ?Mr. [name] has been diagnosed with rectal cancer.  He has clinical stage IV disease.  I discussed the diagnosis, prognosis, and treatment options with him.  He understands the  majority of patients with stage IV colorectal cancer do not undergo curative therapy.  However there is a chance for long-term disease-free survival and potentially cure if he is able to undergo resection of the liver metastases. ? ?I explained the multimodality treatment approach used for treatment of rectal cancer.  He has been referred to radiation oncology and surgery.  His case will be presented at the GI tumor conference on 04/23/2021. ? ?We will submit the rectal biopsy for mismatch repair protein testing to see whether he may be a candidate for immunotherapy. ? ?If he is not an immunotherapy candidate I recommend proceeding with neoadjuvant chemotherapy.  I recommend FOLFOX.  We reviewed potential toxicities associated with the FOLFOX regimen including the chance of nausea/vomiting, mucositis, diarrhea, alopecia, and hematologic toxicity.  We discussed the rash, sun sensitivity, hyperpigmentation, and hand/foot syndrome associated with 5-fluorouracil.  We reviewed the allergic reaction and various types of neuropathy seen with oxaliplatin.  He agrees to proceed.  He will attend a chemotherapy teaching class. ? ?Mr. Jeremiah Allen will be referred for urgent Port-A-Cath placement with the plan to begin FOLFOX on 04/30/2021. ? ?A chemotherapy plan was entered today. ? ?Betsy Coder, MD  ?04/21/2021, 2:02 PM ? ? ?

## 2021-04-21 NOTE — Progress Notes (Signed)
PATIENT NAVIGATOR PROGRESS NOTE ? ?Name: Jeremiah Allen ?Date: 04/21/2021 ?MRN: [id number]  ?DOB: [birth year] ? ? ?Reason for visit:  ?Initial visit with Dr Benay Spice ? ?Comments:  Met with Mr [name] and [name] re: new diagnosis of rectal cancer ?He has consult with Dr [name] tomorrow ?Dr Benay Spice reviewed treatment of FOLFOX for potentially 5-6 cycles. ? ?Have ordered IHC testing on rectal mass  ?On accession number [id number] thru [facility] ? ?Port  placement education done. PAC placement scheduled for Monday, 3/27 at 0730, directions for NPO after midnight, need driver and to take morning meds with sips of water ? ?FOLFOX handout given after Dr Benay Spice reviewed with pt ? ?Added to Tumor Board this week ? ?Referrals made for Social Work to help with applying for medicaid and disability ?Financial advocacy with [name] ?Nutrition with [name] for low residual diet.  ? ?Review of upcoming appts reviewed and verbalized understanding.  ? ? ?Time spent counseling/coordinating care: > 60 minutes ? ?

## 2021-04-21 NOTE — Progress Notes (Signed)
Referrals placed for colorectal surgery with Dr Annye English and liver resection with Dr Michaelle Birks. ? ?Update Dr Zenia Resides to have consult with pt on Monday, 04/28/21 at 1030.  Pt aware of appt.  ? ? ? ? ?

## 2021-04-21 NOTE — Progress Notes (Signed)
Referrals placed for nutrition, social work and financial advocate ? ?

## 2021-04-22 ENCOUNTER — Telehealth: Payer: Self-pay | Admitting: Genetic Counselor

## 2021-04-22 ENCOUNTER — Encounter: Payer: Self-pay | Admitting: Oncology

## 2021-04-22 ENCOUNTER — Encounter: Payer: Self-pay | Admitting: *Deleted

## 2021-04-22 ENCOUNTER — Telehealth: Payer: Self-pay | Admitting: Oncology

## 2021-04-22 ENCOUNTER — Encounter: Payer: Self-pay | Admitting: Radiation Oncology

## 2021-04-22 ENCOUNTER — Ambulatory Visit
Admission: RE | Admit: 2021-04-22 | Discharge: 2021-04-22 | Disposition: A | Discharge status: Home / Self Care | Payer: Self-pay | Source: Ambulatory Visit | Attending: Radiation Oncology | Admitting: Radiation Oncology

## 2021-04-22 ENCOUNTER — Encounter: Payer: Self-pay | Admitting: Licensed Clinical Social Worker

## 2021-04-22 VITALS — BP 115/69 | HR 84 | Temp 97.3°F | Resp 18 | Ht 69.0 in | Wt 142.1 lb

## 2021-04-22 DIAGNOSIS — C20 Malignant neoplasm of rectum: Secondary | ICD-10-CM | POA: Insufficient documentation

## 2021-04-22 DIAGNOSIS — R591 Generalized enlarged lymph nodes: Secondary | ICD-10-CM | POA: Insufficient documentation

## 2021-04-22 DIAGNOSIS — C787 Secondary malignant neoplasm of liver and intrahepatic bile duct: Secondary | ICD-10-CM

## 2021-04-22 DIAGNOSIS — Z79899 Other long term (current) drug therapy: Secondary | ICD-10-CM | POA: Insufficient documentation

## 2021-04-22 DIAGNOSIS — F17210 Nicotine dependence, cigarettes, uncomplicated: Secondary | ICD-10-CM | POA: Insufficient documentation

## 2021-04-22 NOTE — Telephone Encounter (Signed)
Attempted to contact patient to schedule Nutrition appt from referral. No answer so voicemail was left for patient to call back. ?

## 2021-04-22 NOTE — Progress Notes (Signed)
[facility] ?Clinical Social Work ? ?Clinical Social Work was referred by nurse navigator for assessment of psychosocial needs.  Clinical Social Worker contacted patient by phone  to offer support and assess for needs.  CSW left voicemail with contact information and request for return call. ? ? ?Irmgard Rampersaud, LCSW  ?Clinical Social Worker ?[place] ?      ?First attempt ?

## 2021-04-22 NOTE — Telephone Encounter (Signed)
Scheduled appt per 3/21 referral. Pt is aware of appt date and time. Pt is aware to arrive 15 mins prior to appt time and to bring and updated insurance card. Pt is aware of appt location.   ?

## 2021-04-22 NOTE — Progress Notes (Signed)
Spoke with Dr Dicie Beam, pathologist at [facility] to discuss molecular testing,  Rectal tissue sent for [facility] testing for NGS.  Specimen request sent on 04/21/21.  ?

## 2021-04-23 ENCOUNTER — Inpatient Hospital Stay: Payer: Self-pay | Admitting: Hematology

## 2021-04-23 ENCOUNTER — Other Ambulatory Visit: Payer: Self-pay

## 2021-04-23 NOTE — Progress Notes (Signed)
The proposed treatment discussed in conference is for discussion purpose only and is not a binding recommendation.  The patients have not been physically examined, or presented with their treatment options.  Therefore, final treatment plans cannot be decided.  

## 2021-04-24 ENCOUNTER — Telehealth: Payer: Self-pay | Admitting: *Deleted

## 2021-04-24 ENCOUNTER — Inpatient Hospital Stay: Payer: Self-pay

## 2021-04-24 ENCOUNTER — Other Ambulatory Visit: Payer: Self-pay

## 2021-04-24 ENCOUNTER — Inpatient Hospital Stay (HOSPITAL_BASED_OUTPATIENT_CLINIC_OR_DEPARTMENT_OTHER): Payer: Self-pay | Admitting: Genetic Counselor

## 2021-04-24 ENCOUNTER — Telehealth: Payer: Self-pay | Admitting: Medical Oncology

## 2021-04-24 ENCOUNTER — Ambulatory Visit: Payer: Self-pay | Admitting: Hematology

## 2021-04-24 DIAGNOSIS — C20 Malignant neoplasm of rectum: Secondary | ICD-10-CM

## 2021-04-24 NOTE — Telephone Encounter (Signed)
Call to significant other to change his chemo education session to 10 am tomorrow due to office staffing. ?

## 2021-04-24 NOTE — Progress Notes (Signed)
REFERRING PROVIDER: ?Hayden Pedro, PA-C ?[address] ?[place],  [place] [address] ? ?PRIMARY PROVIDER:  ?System, Provider Not In ? ?PRIMARY REASON FOR VISIT:  ?Encounter Diagnosis  ?Name Primary?  ? Rectal cancer ([place]) Yes  ? ? ?HISTORY OF PRESENT ILLNESS:   ?Mr. Chausse, a 36 y.o. male, was seen for a [place] cancer genetics consultation at the request of Dr. Dara Lords due to a personal history of cancer.  Mr. Lesesne presents to clinic today to discuss the possibility of a hereditary predisposition to cancer, to discuss genetic testing, and to further clarify his future cancer risks, as well as potential cancer risks for family members.  ? ?In March 2023, at the age of 16, Mr. Meinecke was diagnosed with rectal cancer. MSI/IHC is currently pending.   ? ? ?CANCER HISTORY:  ?Oncology History  ?Rectal cancer ([place])  ?04/18/2021 Initial Diagnosis  ? Rectal cancer [facility]) ?  ?04/21/2021 Cancer Staging  ? Staging form: Colon and Rectum, AJCC 8th Edition ?- Clinical: cT4a, cN2, cM1 - Signed by Ladell Pier, MD on 04/21/2021 ?Histologic grade (G): G2 ?Histologic grading system: 4 grade system ?Laterality: Left ?Sites of metastasis: Liver ?Lymph-vascular invasion (LVI): LVI present/identified, NOS ? ?  ?04/30/2021 -  Chemotherapy  ? Patient is on Treatment Plan : COLORECTAL FOLFOX q14d x 3 months  ?   ? ?No past medical history on file. ? ?Past Surgical History:  ?Procedure Laterality Date  ? FLEXIBLE SIGMOIDOSCOPY N/A 04/16/2021  ? Procedure: FLEXIBLE SIGMOIDOSCOPY;  Surgeon: Toledo, Benay Pike, MD;  Location: [facility] ENDOSCOPY;  Service: Gastroenterology;  Laterality: N/A;  ? ? ?Social History  ? ?Socioeconomic History  ? Marital status: Single  ?  Spouse name: Not on file  ? Number of children: Not on file  ? Years of education: Not on file  ? Highest education level: Not on file  ?Occupational History  ? Not on file  ?Tobacco Use  ? Smoking status: Every Day  ?  Types: Cigarettes  ? Smokeless tobacco: Not on file   ?Vaping Use  ? Vaping Use: Never used  ?Substance and Sexual Activity  ? Alcohol use: Never  ? Drug use: Never  ? Sexual activity: Not on file  ?Other Topics Concern  ? Not on file  ?Social History Narrative  ? 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 <1/2 ppd; no alcohol. Fiance. No children; 2 cats and dog.   ? ?Social Determinants of Health  ? ?Financial Resource Strain: Not on file  ?Food Insecurity: Not on file  ?Transportation Needs: Not on file  ?Physical Activity: Not on file  ?Stress: Not on file  ?Social Connections: Not on file  ?  ? ?FAMILY HISTORY:  ?We obtained a detailed, 4-generation family history.  Significant diagnoses are listed below: ?Family History  ?Problem Relation Age of Onset  ? Heart disease Mother   ? Miscarriages / Stillbirths Sister   ? Asthma Sister   ? Stroke Sister   ?     at birth  ? Developmental delay Brother   ? ? ? ?Mr. Ionescu is unaware of previous family history of genetic testing for hereditary cancer risks. There is no reported Ashkenazi Jewish ancestry.  ? ?GENETIC COUNSELING ASSESSMENT: Mr. Pacella is a 36 y.o. male with a personal history of cancer which is somewhat suggestive of a hereditary predisposition to cancer given his young age at diagnosis. We, therefore, discussed and recommended the following at today's visit.  ? ?DISCUSSION:  We discussed that 5 - 10% of cancer is hereditary, with most cases of rectal cancer associated with Lynch Syndrome.  There are other genes that can be associated with hereditary rectal cancer syndromes.  We discussed that testing is beneficial for several reasons including knowing how to follow individuals after completing their treatment and understanding if other family members could be at risk for cancer.  ? ?We reviewed the characteristics, features and inheritance patterns of hereditary cancer syndromes. We also discussed genetic testing, including the appropriate family members to test, the process of  testing, insurance coverage and turn-around-time for results. We discussed the implications of a negative, positive, carrier and/or variant of uncertain significant result. We recommended Mr. Panas pursue genetic testing for a panel that includes genes associated with colorectal cancer.  ? ?Based on Mr. Palau personal history of cancer, he meets medical criteria for genetic testing. Despite that he meets criteria, he may still have an out of pocket cost. We discussed that if his out of pocket cost for testing is over $100, the laboratory will call and confirm whether he wants to proceed with testing.  If the out of pocket cost of testing is less than $100 he will be billed by the genetic testing laboratory.  ? ?PLAN: Mr. Franta would like to proceed with genetic testing. However, he does not currently have insurance and is in the process of obtaining Medicaid. We discussed the option to proceed with a laboratory patient assistance program to have free testing or waiting until he has Medicaid. He would prefer to wait until he has Medicaid. He will contact our office when he has Medicaid and proceed with testing at that time.  ? ?Mr. Pearlman's questions were answered to his satisfaction today. Our contact information was provided should additional questions or concerns arise. Thank you for the referral and allowing [place] to share in the care of your patient.  ? ?Lucille Passy, MS, [place] ?Genetic Counselor ?[email or web address] ?(P) [phone] ? ?The patient was seen for a total of 25 minutes in face-to-face genetic counseling.  The patient brought his fiance. Drs. Lindi Adie and/or Burr Medico were available to discuss this case as needed.  ? ?_______________________________________________________________________ ?For Office Staff:  ?Number of people involved in session: 2 ?Was an Intern/ student involved with case: no ? ?

## 2021-04-24 NOTE — Telephone Encounter (Signed)
[employer] 2021-05 - Specimen Collection Study to Evaluate Biomarkers in Subjects with Cancer  ? ?Outgoing call: [phone] ?LVMOM with patient regarding study. I introduced myself and informed patient that Dr. Benay Spice had referred him to this study and I was following up with patient to see if he would be interested in more information. I gave patient a brief description of the study and asked him to call me back. Patient has appointment with chemo-education at [facility] tomorrow afternoon.  ?My return contact information provided. Patient thanked.  ?Jeremiah Marion, RN, BSN, [place] ?Clinical Research Nurse Lead ?04/24/2021 3:46 PM ? ? ?

## 2021-04-25 ENCOUNTER — Other Ambulatory Visit: Payer: Self-pay | Admitting: Radiology

## 2021-04-25 ENCOUNTER — Inpatient Hospital Stay: Payer: Self-pay

## 2021-04-25 MED ORDER — ONDANSETRON HCL 8 MG PO TABS: 8.0000 mg | ORAL_TABLET | Freq: Three times a day (TID) | ORAL | 3 refills | Status: DC | PRN

## 2021-04-25 MED ORDER — PROCHLORPERAZINE MALEATE 10 MG PO TABS: 10.0000 mg | ORAL_TABLET | Freq: Four times a day (QID) | ORAL | 2 refills | Status: DC | PRN

## 2021-04-25 MED ORDER — LIDOCAINE-PRILOCAINE 2.5-2.5 % EX CREA: 1.0000 "application " | TOPICAL_CREAM | CUTANEOUS | 0 refills | Status: DC | PRN

## 2021-04-27 ENCOUNTER — Other Ambulatory Visit: Payer: Self-pay | Admitting: Oncology

## 2021-04-28 ENCOUNTER — Ambulatory Visit (HOSPITAL_COMMUNITY)
Admission: RE | Admit: 2021-04-28 | Discharge: 2021-04-28 | Disposition: A | Discharge status: Home / Self Care | Payer: Self-pay | Source: Ambulatory Visit | Attending: Oncology | Admitting: Oncology

## 2021-04-28 ENCOUNTER — Encounter (HOSPITAL_COMMUNITY): Payer: Self-pay

## 2021-04-28 ENCOUNTER — Other Ambulatory Visit: Payer: Self-pay

## 2021-04-28 DIAGNOSIS — C19 Malignant neoplasm of rectosigmoid junction: Secondary | ICD-10-CM | POA: Insufficient documentation

## 2021-04-28 DIAGNOSIS — K6289 Other specified diseases of anus and rectum: Secondary | ICD-10-CM

## 2021-04-28 DIAGNOSIS — F17210 Nicotine dependence, cigarettes, uncomplicated: Secondary | ICD-10-CM | POA: Insufficient documentation

## 2021-04-28 HISTORY — PX: IR IMAGING GUIDED PORT INSERTION: IMG5740

## 2021-04-28 MED ORDER — MIDAZOLAM HCL 2 MG/2ML IJ SOLN
INTRAMUSCULAR | Status: AC | PRN
Start: 2021-04-28 — End: 2021-04-28
  Administered 2021-04-28: 1 mg via INTRAVENOUS

## 2021-04-28 MED ORDER — FENTANYL CITRATE (PF) 100 MCG/2ML IJ SOLN
INTRAMUSCULAR | Status: AC
  Filled 2021-04-28: qty 2

## 2021-04-28 MED ORDER — MIDAZOLAM HCL 2 MG/2ML IJ SOLN
INTRAMUSCULAR | Status: AC
  Filled 2021-04-28: qty 4

## 2021-04-28 MED ORDER — DIPHENHYDRAMINE HCL 50 MG/ML IJ SOLN
INTRAMUSCULAR | Status: AC | PRN
Start: 2021-04-28 — End: 2021-04-28
  Administered 2021-04-28: 25 mg via INTRAVENOUS

## 2021-04-28 MED ORDER — LIDOCAINE-EPINEPHRINE 1 %-1:100000 IJ SOLN
INTRAMUSCULAR | Status: AC
  Filled 2021-04-28: qty 1

## 2021-04-28 MED ORDER — LIDOCAINE HCL 1 % IJ SOLN
INTRAMUSCULAR | Status: AC
  Filled 2021-04-28: qty 20

## 2021-04-28 MED ORDER — SODIUM CHLORIDE 0.9 % IV SOLN: INTRAVENOUS | Status: DC

## 2021-04-28 MED ORDER — MIDAZOLAM HCL 5 MG/5ML IJ SOLN
INTRAMUSCULAR | Status: AC | PRN
  Administered 2021-04-28: 1 mg via INTRAVENOUS

## 2021-04-28 MED ORDER — FENTANYL CITRATE (PF) 100 MCG/2ML IJ SOLN
INTRAMUSCULAR | Status: AC | PRN
Start: 2021-04-28 — End: 2021-04-28
  Administered 2021-04-28 (×2): 50 ug via INTRAVENOUS

## 2021-04-28 MED ORDER — HEPARIN SOD (PORK) LOCK FLUSH 100 UNIT/ML IV SOLN
INTRAVENOUS | Status: AC
  Filled 2021-04-28: qty 5

## 2021-04-28 MED ORDER — DIPHENHYDRAMINE HCL 50 MG/ML IJ SOLN
INTRAMUSCULAR | Status: AC
  Filled 2021-04-28: qty 1

## 2021-04-28 NOTE — Discharge Instructions (Signed)
For questions /concerns may call Interventional Radiology at [phone]  You may remove your dressing and shower tomorrow afternoon  DO NOT use EMLA cream for 2 weeks after port placement as the cream will remove surgical glue on your incision.     Moderate Conscious Sedation, Adult, Care After This sheet gives you information about how to care for yourself after your procedure. Your health care provider may also give you more specific instructions. If you have problems or questions, contact your health careprovider. What can I expect after the procedure? After the procedure, it is common to have: Sleepiness for several hours. Impaired judgment for several hours. Difficulty with balance. Vomiting if you eat too soon. Follow these instructions at home: For the time period you were told by your health care provider: Rest. Do not participate in activities where you could fall or become injured. Do not drive or use machinery. Do not drink alcohol. Do not take sleeping pills or medicines that cause drowsiness. Do not make important decisions or sign legal documents. Do not take care of children on your own. Eating and drinking  Follow the diet recommended by your health care provider. Drink enough fluid to keep your urine pale yellow. If you vomit: Drink water, juice, or soup when you can drink without vomiting. Make sure you have little or no nausea before eating solid foods.  General instructions Take over-the-counter and prescription medicines only as told by your health care provider. Have a responsible adult stay with you for the time you are told. It is important to have someone help care for you until you are awake and alert. Do not smoke. Keep all follow-up visits as told by your health care provider. This is important. Contact a health care provider if: You are still sleepy or having trouble with balance after 24 hours. You feel light-headed. You keep feeling nauseous or  you keep vomiting. You develop a rash. You have a fever. You have redness or swelling around the IV site. Get help right away if: You have trouble breathing. You have new-onset confusion at home. Summary After the procedure, it is common to feel sleepy, have impaired judgment, or feel nauseous if you eat too soon. Rest after you get home. Know the things you should not do after the procedure. Follow the diet recommended by your health care provider and drink enough fluid to keep your urine pale yellow. Get help right away if you have trouble breathing or new-onset confusion at home. This information is not intended to replace advice given to you by your health care provider. Make sure you discuss any questions you have with your healthcare provider.  Implanted Port Insertion, Care After This sheet gives you information about how to care for yourself after your procedure. Your health care provider may also give you more specific instructions. If you have problems or questions, contact your health careprovider. What can I expect after the procedure? After the procedure, it is common to have: Discomfort at the port insertion site. Bruising on the skin over the port. This should improve over 3-4 days. Follow these instructions at home: Port care After your port is placed, you will get a manufacturer's information card. The card has information about your port. Keep this card with you at all times. Take care of the port as told by your health care provider. Ask your health care provider if you or a family member can get training for taking care of the port at home. A   home health care nurse may also take care of the port. Make sure to remember what type of port you have. Incision care Follow instructions from your health care provider about how to take care of your port insertion site. Make sure you: Wash your hands with soap and water before and after you change your bandage (dressing). If soap  and water are not available, use hand sanitizer. Change your dressing as told by your health care provider. Leave skin glue, or adhesive strips in place. These skin closures may need to stay in place for 2 weeks or longer.  Check your port insertion site every day for signs of infection. Check for:      - Redness, swelling, or pain.                     - Fluid or blood.      - Warmth.      - Pus or a bad smell. Activity Return to your normal activities as told by your health care provider. Ask your health care provider what activities are safe for you. Do not lift anything that is heavier than 10 lb (4.5 kg), or the limit that you are told, until your health care provider says that it is safe. General instructions Take over-the-counter and prescription medicines only as told by your health care provider. Do not take baths, swim, or use a hot tub until your health care provider approves. Ask your health care provider if you may take showers. You may only be allowed to take sponge baths. Do not drive for 24 hours if you were given a sedative during your procedure. Wear a medical alert bracelet in case of an emergency. This will tell any health care providers that you have a port. Keep all follow-up visits as told by your health care provider. This is important. Contact a health care provider if: You cannot flush your port with saline as directed, or you cannot draw blood from the port. You have a fever or chills. You have redness, swelling, or pain around your port insertion site. You have fluid or blood coming from your port insertion site. Your port insertion site feels warm to the touch. You have pus or a bad smell coming from the port insertion site. Get help right away if: You have chest pain or shortness of breath. You have bleeding from your port that you cannot control. Summary Take care of the port as told by your health care provider. Keep the manufacturer's information card with  you at all times. Change your dressing as told by your health care provider. Contact a health care provider if you have a fever or chills or if you have redness, swelling, or pain around your port insertion site. Keep all follow-up visits as told by your health care provider. This information is not intended to replace advice given to you by your health care provider. Make sure you discuss any questions you have with your healthcare provider. Document Revised: 08/17/2017 Document Reviewed: 08/17/2017  

## 2021-04-28 NOTE — Procedures (Signed)
Interventional Radiology Procedure Note  Procedure: RT IJ POWER PORT    Complications: None  Estimated Blood Loss:  MIN  Findings: TIP SVCRA    M. TREVOR Jaiden Dinkins, MD    

## 2021-04-28 NOTE — H&P (Signed)
? ?Chief Complaint: ?Patient was seen in consultation today for [facility] placement at the request of Sherrill,Gary B ? ?Referring Physician(s): Sherrill,Gary B ? ?Supervising Physician: Daryll Brod ? ?Patient Status: [place] ? ?History of Present Illness: ?Jeremiah Allen is a 36 y.o. male with PMHs of recent diagnosis of rectal CA, diagnosed on 04/16/21.  ?Patient noticed blood from rectum and underwent CTA on 3/15/2 which showed: ? ?Bulky and irregular rectal wall thickening up to 16 mm with ?perirectal lymphadenopathy, and two indeterminate liver lesions. ?While this might be an inflammatory proctitis, cannot exclude Rectal ?Carcinoma with Liver Metastases ? 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 ? ?Patient underwent sigmoidoscopy on 04/16/21 and rectal mass biopsy, pathology showed invasive moderately differentiated adenocarcinoma.  ? ?Patient was referred to med onc for further eval and management and established care with Dr. Benay Spice, who recommended a systemic chemotherapy for the treatment. After thorough discussion and shared decision making, patient decided to proceed with the chemo.  ? ?IR was requested for [facility] placement.  ? ?Patient laying in bed, not in acute distress.  ?Denise headache, fever, chills, shortness of breath, cough, chest pain, abdominal pain, nausea ,vomiting, and bleeding. ? ? ?History reviewed. No pertinent past medical history. ? ?Past Surgical History:  ?Procedure Laterality Date  ? FLEXIBLE SIGMOIDOSCOPY N/A 04/16/2021  ? Procedure: FLEXIBLE SIGMOIDOSCOPY;  Surgeon: Toledo, Benay Pike, MD;  Location: [facility] ENDOSCOPY;  Service: Gastroenterology;  Laterality: N/A;  ? ? ?Allergies: ?Patient has no known allergies. ? ?Medications: ?Prior to Admission medications   ?Medication Sig Start Date End Date Taking? Authorizing Provider  ?acetaminophen (TYLENOL) 500 MG tablet Take 500-1,000 mg by mouth every 6 (six) hours as needed for  mild pain or moderate pain.   Yes [provider]  ?ARIPiprazole (ABILIFY) 2 MG tablet Take 2 mg by mouth daily.   Yes [provider]  ?gabapentin (NEURONTIN) 300 MG capsule Take 1 capsule (300 mg total) by mouth 3 (three) times daily. 04/21/21  Yes Ladell Pier, MD  ?hydrOXYzine (ATARAX) 10 MG tablet Take 10 mg by mouth 3 (three) times daily as needed for anxiety.   Yes [provider]  ?lidocaine-prilocaine (EMLA) cream Apply 1 application. topically as needed. 04/25/21   Ladell Pier, MD  ?ondansetron (ZOFRAN) 8 MG tablet Take 1 tablet (8 mg total) by mouth every 8 (eight) hours as needed for nausea or vomiting (May start using after day 3 for nausea and vomiting). 04/25/21   Ladell Pier, MD  ?prochlorperazine (COMPAZINE) 10 MG tablet Take 1 tablet (10 mg total) by mouth every 6 (six) hours as needed for nausea or vomiting. 04/25/21   Ladell Pier, MD  ?sucralfate (CARAFATE) 1 g tablet Take 1 g by mouth 2 (two) times daily.    [provider]  ?  ? ?Family History  ?Problem Relation Age of Onset  ? Heart disease Mother   ? Miscarriages / Stillbirths Sister   ? Asthma Sister   ? Stroke Sister   ?     at birth  ? Developmental delay Brother   ? ? ?Social History  ? ?Socioeconomic History  ? Marital status: Single  ?  Spouse name: Not on file  ? Number of children: Not on file  ? Years of education: Not on file  ? Highest education level: Not on file  ?Occupational History  ? Not on file  ?Tobacco Use  ? Smoking status:  Every Day  ?  Types: Cigarettes  ? Smokeless tobacco: Not on file  ?Vaping Use  ? Vaping Use: Never used  ?Substance and Sexual Activity  ? Alcohol use: Never  ? Drug use: Never  ? Sexual activity: Not on file  ?Other Topics Concern  ? Not on file  ?Social History Narrative  ? 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 <1/2 ppd; no alcohol. Fiance. No children; 2 cats and dog.   ? ?Social Determinants of Health  ? ?Financial  Resource Strain: Not on file  ?Food Insecurity: Not on file  ?Transportation Needs: Not on file  ?Physical Activity: Not on file  ?Stress: Not on file  ?Social Connections: Not on file  ? ? ? ?Review of Systems: A 12 point ROS discussed and pertinent positives are indicated in the HPI above.  All other systems are negative. ? ?Vital Signs: ?BP 101/64   Pulse 72   Temp 98.4 ?F (36.9 ?C) (Oral)   Resp 20   Ht '5\' 9"'$  (1.753 m)   Wt 142 lb 3.2 oz (64.5 kg)   SpO2 99%   BMI 21.00 kg/m?  ? ?Physical Exam ?Vitals and nursing note reviewed.  ?Constitutional:   ?   General: Patient is not in acute distress. ?   Appearance: Normal appearance. Patient is not ill-appearing.  ?HENT:  ?   Head: Normocephalic and atraumatic.  ?   Mouth/Throat:  ?   Mouth: Mucous membranes are moist.  ?   Pharynx: Oropharynx is clear.  ?Cardiovascular:  ?   Rate and Rhythm: Normal rate and regular rhythm.  ?   Pulses: Normal pulses.  ?   Heart sounds: Normal heart sounds.  ?Pulmonary:  ?   Effort: Pulmonary effort is normal.  ?   Breath sounds: Normal breath sounds.  ?Abdominal:  ?   General: Abdomen is flat. Bowel sounds are normal.  ?   Palpations: Abdomen is soft.  ?Musculoskeletal:  ?   Cervical back: Neck supple.  ?Skin: ?   General: Skin is warm and dry.  ?   Coloration: Skin is not jaundiced or pale.  ?Neurological:  ?   Mental Status: Patient is alert and oriented to person, place, and time.  ?Psychiatric:     ?   Mood and Affect: Mood normal.     ?   Behavior: Behavior normal.     ?   Judgment: Judgment normal.  ? ? ? ?MD Evaluation ?Airway: WNL ?Heart: WNL ?Abdomen: WNL ?Chest/ Lungs: WNL ?ASA  Classification: 3 ?Mallampati/Airway Score: Two ? ?Imaging: ?CT CHEST W CONTRAST ? ?Result Date: 04/18/2021 ?CLINICAL DATA:  Staging for colon carcinoma. EXAM: CT CHEST WITH CONTRAST TECHNIQUE: Multidetector CT imaging of the chest was performed during intravenous contrast administration. RADIATION DOSE REDUCTION: This exam was performed  according to the departmental dose-optimization program which includes automated exposure control, adjustment of the mA and/or kV according to patient size and/or use of iterative reconstruction technique. CONTRAST:  59m OMNIPAQUE IOHEXOL 300 MG/ML  SOLN COMPARISON:  04/16/2021, CTA abdomen and pelvis it is. CTA chest, 08/15/2020. FINDINGS: Cardiovascular: Heart is normal in size and configuration. No pericardial effusion. Normal great vessels. Mediastinum/Nodes: No enlarged mediastinal, hilar, or axillary lymph nodes. Thyroid gland, trachea, and esophagus demonstrate no significant findings. Lungs/Pleura: Clear lungs. No lung masses or nodules. No pleural effusion or pneumothorax. Upper Abdomen: Subtle liver lesions, segment 6 and 7, unchanged from the previous day's abdomen MRI. No acute findings. Musculoskeletal: No  fracture or acute finding. No osteoblastic or osteolytic lesions. No chest wall masses. IMPRESSION: 1. No abnormality of the chest. Specifically, no evidence metastatic disease. 2. Liver lesions as better visualized on the previous day's abdomen MRI. Electronically Signed   By: Lajean Manes M.D.   On: 04/18/2021 10:20  ? ?MR LIVER W WO CONTRAST ? ?Result Date: 04/17/2021 ?CLINICAL DATA:  A 37 year old male presents for evaluation of hepatic lesions seen on recent CT imaging in the setting of rectal neoplasm. EXAM: MRI ABDOMEN WITHOUT AND WITH CONTRAST TECHNIQUE: Multiplanar multisequence MR imaging of the abdomen was performed both before and after the administration of intravenous contrast. CONTRAST:  19m GADAVIST GADOBUTROL 1 MMOL/ML IV SOLN COMPARISON:  CT a of the chest, abdomen and pelvis dated April 16, 2021 FINDINGS: Lower chest: Incidental imaging of the lung bases unremarkable on MRI. Hepatobiliary: No substantial fat or iron in the liver. Three subtle lesions in the liver showing rim like enhancement and central hypoenhancement largest in the posterior RIGHT hepatic lobe measuring  approximately 1.9 x 1.7 cm (image 31/15). This is in hepatic subsegment VII. Second lesion in hepatic subsegment VIII (image 15/15) 14 mm greatest axial dimension. Third lesion along the margin of the inferior RIGHT hemiliver,

## 2021-04-29 ENCOUNTER — Telehealth: Payer: Self-pay | Admitting: Medical Oncology

## 2021-04-29 ENCOUNTER — Encounter: Payer: Self-pay | Admitting: *Deleted

## 2021-04-29 ENCOUNTER — Encounter: Payer: Self-pay | Admitting: Licensed Clinical Social Worker

## 2021-04-29 NOTE — Progress Notes (Signed)
[place] ?Clinical Social Work ? ?Clinical Social Work was referred by nurse navigator for assessment of psychosocial needs.  Clinical Social Worker contacted caregiver by phone  to offer support and assess for needs.  CSW spoke with caregiver Jeremiah Allen [phone], she will meet with CSW today 04/29/2021 at 2:30PM at [facility]. ? ? ? ? ? ?Maleiah Dula, LCSW  ?Clinical Social Worker ?[place] ?      ? ?

## 2021-04-29 NOTE — Progress Notes (Signed)
Pharmacist Chemotherapy Monitoring - Initial Assessment   ? ?Anticipated start date: 04/30/21  ? ?The following has been reviewed per standard work regarding the patient's treatment regimen: ?The patient's diagnosis, treatment plan and drug doses, and organ/hematologic function ?Lab orders and baseline tests specific to treatment regimen  ?The treatment plan start date, drug sequencing, and pre-medications ?Prior authorization status  ?Patient's documented medication list, including drug-drug interaction screen and prescriptions for anti-emetics and supportive care specific to the treatment regimen ?The drug concentrations, fluid compatibility, administration routes, and timing of the medications to be used ?The patient's access for treatment and lifetime cumulative dose history, if applicable  ?The patient's medication allergies and previous infusion related reactions, if applicable  ? ?Changes made to treatment plan:  ?N/A ? ?Follow up needed:  ?N/A ? ? ?Jeremiah Allen, [facility], ?04/29/2021  3:33 PM  ?

## 2021-04-29 NOTE — Progress Notes (Signed)
[place] Clinical Social Work  ?Initial Assessment ? ? ?Jeremiah Allen is a 36 y.o. year old male seen in caregiver in [place] office . Clinical Social Work was referred by nurse navigator for assessment of psychosocial needs.  ? ?SDOH (Social Determinants of Health) assessments performed: Yes ?SDOH Interventions   ? ?Flowsheet Row Most Recent Value  ?SDOH Interventions   ?Food Insecurity Interventions Intervention Not Indicated  ?Financial Strain Interventions Intervention Not Indicated  ?Housing Interventions Intervention Not Indicated  ?Intimate Partner Violence Interventions Intervention Not Indicated  ?Physical Activity Interventions Intervention Not Indicated  ?Stress Interventions Intervention Not Indicated  ?Social Connections Interventions Intervention Not Indicated  ?Transportation Interventions Intervention Not Indicated, Patient Resources (Friends/Family)  ? ?  ?  ?Distress Screen completed: No ? ?  04/22/2021  ? 11:41 AM  ?ONCBCN DISTRESS SCREENING  ?Screening Type Initial Screening  ?Distress experienced in past week (1-10) 7  ?Practical problem type Housing;Transportation;Food  ?Emotional problem type Nervousness/Anxiety;Boredom  ?Physical Problem type Sleep/insomnia  ?Other Contact on Cell phone  ? ? ? ? ?Family/Social Information:  ?Housing Arrangement: patient lives with significant other Jeremiah Allen [phone] ?Family members/support persons in your life? Family and Friends/Colleagues ?Transportation concerns: no, car giver can transport patient, but may need to use [facility] transport assistance sometimes.  ?Employment: Unemployed. Income source: No income ?Financial concerns: Yes, due to illness and/or loss of work during treatment ?Type of concern: Utilities, Rent/ mortgage, Phone, Transportation, Medical bills, and Food ?Food access concerns: yes, concerned about food access due to loss of job. ?Religious or spiritual practice: no ?Services Currently in place:  N/A ? ?Coping/ Adjustment to  diagnosis: ?Patient understands treatment plan and what happens next? yes ?Concerns about diagnosis and/or treatment: Pain or discomfort during procedures, Embarrassment, Feelings of anger or sadness, Body image (disfigurement), Losing my job, Software engineer by information, Afraid of cancer, and How I will pay for the services I need ?Patient reported stressors: Housing, Insurance underwriter, Work/ school, Publishing rights manager, Transport planner, Haematologist, Depression, Anxiety, Adjusting to my illness, Isolation/ feeling alone, Feeling hopeless, Facing my mortality, Loss of sense of purpose, and Physical issues ?Hopes and priorities: N/A ?Patient enjoys watching TV, time with family/ friends, and referee ?Current coping skills/ strengths: Average or above average intelligence , Capable of independent living , Communication skills , Motivation for treatment/growth , and Supportive family/friends  ? ? ? SUMMARY: ?Current SDOH Barriers:  ?Financial constraints related to no income, Limited social support, Level of care concerns, Medication procurement, Mental Health Concerns , Family and relationship dysfunction, Social Isolation, and   ? ?Clinical Social Work Clinical Goal(s):  ?patient will work with SW to address concerns related to illness adjustment and anxiety ? ?Interventions: ?Discussed common feeling and emotions when being diagnosed with cancer, and the importance of support during treatment ?Informed patient of the support team roles and support services at [facility] ?Provided CSW contact information and encouraged patient to call with any questions or concerns ?Referred patient to [facility], [place] for [place], Provided patient with information about CSW role in patient care, available resources, and Provided education and assistance to patient regarding Advance Directives ? ? ?Follow Up Plan: Patient will contact CSW with any support or resource needs and CSW will see patient on 05/06/2021 at 2:30PM ?Patient verbalizes  understanding of plan: Yes ? ? ? ?Jeremiah Galluzzo, LCSW ?

## 2021-04-29 NOTE — Telephone Encounter (Signed)
[employer] 2021-05 - Specimen Collection Study to Evaluate Biomarkers in Subjects with Cancer  ? ?Outgoing call: [phone] ?LVMOM with patient regarding study again Informed patient that Dr. Benay Spice had referred him to this study and I was following up with patient to see if he would be interested in more information. I gave patient a brief description of the study and asked him to call me back should he be interest. I also informed patient that if he is interested, we would need to collect blood tomorrow before he starts treatment. Patient's chemo education appointment was changed last minute and I was unable to discuss with him at that time. ?Patient thanked and my return call information provided.   ?My return contact information provided.  ? ?Maxwell Marion, RN, BSN, [place] ?Clinical Research Nurse Lead ?04/29/2021 2:08 PM ? ? ?

## 2021-04-30 ENCOUNTER — Inpatient Hospital Stay (HOSPITAL_BASED_OUTPATIENT_CLINIC_OR_DEPARTMENT_OTHER): Payer: Self-pay | Admitting: Nurse Practitioner

## 2021-04-30 ENCOUNTER — Encounter: Payer: Self-pay | Admitting: Nutrition

## 2021-04-30 ENCOUNTER — Encounter: Payer: Self-pay | Admitting: *Deleted

## 2021-04-30 ENCOUNTER — Inpatient Hospital Stay: Payer: Self-pay | Admitting: Nutrition

## 2021-04-30 ENCOUNTER — Inpatient Hospital Stay: Payer: Self-pay

## 2021-04-30 ENCOUNTER — Other Ambulatory Visit: Payer: Self-pay

## 2021-04-30 ENCOUNTER — Encounter: Payer: Self-pay | Admitting: Nurse Practitioner

## 2021-04-30 VITALS — BP 127/80 | HR 85 | Temp 97.7°F | Resp 18 | Ht 69.0 in | Wt 143.6 lb

## 2021-04-30 DIAGNOSIS — C20 Malignant neoplasm of rectum: Secondary | ICD-10-CM

## 2021-04-30 MED ORDER — OXALIPLATIN CHEMO INJECTION 100 MG/20ML
85.0000 mg/m2 | Freq: Once | INTRAVENOUS | Status: AC
  Administered 2021-04-30: 150 mg via INTRAVENOUS
  Filled 2021-04-30: qty 20

## 2021-04-30 MED ORDER — PALONOSETRON HCL INJECTION 0.25 MG/5ML
0.2500 mg | Freq: Once | INTRAVENOUS | Status: AC
  Administered 2021-04-30: 0.25 mg via INTRAVENOUS
  Filled 2021-04-30: qty 5

## 2021-04-30 MED ORDER — SODIUM CHLORIDE 0.9 % IV SOLN
2400.0000 mg/m2 | INTRAVENOUS | Status: DC
  Administered 2021-04-30: 4250 mg via INTRAVENOUS
  Filled 2021-04-30: qty 85

## 2021-04-30 MED ORDER — SODIUM CHLORIDE 0.9 % IV SOLN
10.0000 mg | Freq: Once | INTRAVENOUS | Status: AC
  Administered 2021-04-30: 10 mg via INTRAVENOUS
  Filled 2021-04-30: qty 1

## 2021-04-30 MED ORDER — LEUCOVORIN CALCIUM INJECTION 350 MG
400.0000 mg/m2 | Freq: Once | INTRAVENOUS | Status: AC
  Administered 2021-04-30: 708 mg via INTRAVENOUS
  Filled 2021-04-30: qty 35.4

## 2021-04-30 MED ORDER — DEXTROSE 5 % IV SOLN: Freq: Once | INTRAVENOUS | Status: AC

## 2021-04-30 MED ORDER — FLUOROURACIL CHEMO INJECTION 2.5 GM/50ML
400.0000 mg/m2 | Freq: Once | INTRAVENOUS | Status: AC
  Administered 2021-04-30: 700 mg via INTRAVENOUS
  Filled 2021-04-30: qty 14

## 2021-04-30 NOTE — Progress Notes (Signed)
Patient presents for treatment. RN assessment completed along with the following: ? ?Labs/vitals reviewed - Yes, and within treatment parameters.  Per Ned Card, NP ok to treat with labs from 04/16/21 and 04/17/21.  ?Weight within 10% of previous measurement - Yes ?Informed consent completed and reflects current therapy/intent - Yes, on date 04/30/21             ?Provider progress note reviewed - Yes, today's provider note was reviewed. ?Treatment/Antibody/Supportive plan reviewed - Yes, and there are no adjustments needed for today's treatment. ?S&H and other orders reviewed - Yes, and there are no additional orders identified. ?Previous treatment date reviewed -  This is patient's first treatment.  ?Clinic Hand Off Received from - Merceda Elks, RN ? ?Patient to proceed with treatment.   ?

## 2021-04-30 NOTE — Progress Notes (Signed)
?[name] ?OFFICE PROGRESS NOTE ? ? ?Diagnosis:  Rectal cancer ? ?INTERVAL HISTORY:  ? ?Jeremiah Allen returns as scheduled.  He is scheduled to begin treatment today with FOLFOX.  He notes the port is "sore".  Bowels are moving.  He continues to stool softener.  He notes less rectal bleeding.  No rectal pain, describes as "pressure".  No nausea or vomiting. ? ?Objective: ? ?Vital signs in last 24 hours: ? ?Blood pressure 127/80, pulse 85, temperature 97.7 ?F (36.5 ?C), temperature source Oral, resp. rate 18, height '5\' 9"'$  (1.753 m), weight 143 lb 9.6 oz (65.1 kg), SpO2 99 %. ?  ? ?Resp: Lungs clear bilaterally. ?Cardio: Regular rate and rhythm. ?GI: Abdomen soft.  Tender at the right lower abdomen.  No hepatomegaly. ?Vascular: No leg edema. ?Skin: Palms without erythema. ?Port-A-Cath without erythema. ? ? ?Lab Results: ? ?Lab Results  ?Component Value Date  ? WBC 4.6 04/17/2021  ? HGB 11.1 (L) 04/17/2021  ? HCT 33.6 (L) 04/17/2021  ? MCV 91.1 04/17/2021  ? PLT 248 04/17/2021  ? NEUTROABS 3.4 04/16/2021  ? ? ?Imaging: ? ?IR IMAGING GUIDED PORT INSERTION ? ?Result Date: 04/28/2021 ?CLINICAL DATA:  Metastatic colorectal cancer EXAM: RIGHT INTERNAL JUGULAR SINGLE LUMEN POWER PORT CATHETER INSERTION Date:  04/28/2021 04/28/2021 10:18 am Radiologist:  M. Daryll Brod, MD Guidance:  ultrasound and fluoroscopic MEDICATIONS: 1% lidocaine local with epinephrine ANESTHESIA/SEDATION: Versed 2.0 mg IV; Fentanyl 100 mcg IV; 25 mg Benadryl Moderate Sedation Time:  36 minute The patient was continuously monitored during the procedure by the interventional radiology nurse under my direct supervision. FLUOROSCOPY: 0 minutes, 36 seconds (1.0 mGy) COMPLICATIONS: None immediate. CONTRAST:  None. PROCEDURE: Informed consent was obtained from the patient following explanation of the procedure, risks, benefits and alternatives. The patient understands, agrees and consents for the procedure. All questions were addressed. A time  out was performed. Maximal barrier sterile technique utilized including caps, mask, sterile gowns, sterile gloves, large sterile drape, hand hygiene, and 2% chlorhexidine scrub. Under sterile conditions and local anesthesia, right internal jugular micropuncture venous access was performed. Access was performed with ultrasound. Images were obtained for documentation of the patent right internal jugular vein. A guide wire was inserted followed by a transitional dilator. This allowed insertion of a guide wire and catheter into the IVC. Measurements were obtained from the SVC / RA junction back to the right IJ venotomy site. In the right infraclavicular chest, a subcutaneous pocket was created over the second anterior rib. This was done under sterile conditions and local anesthesia. 1% lidocaine with epinephrine was utilized for this. A 2.5 cm incision was made in the skin. Blunt dissection was performed to create a subcutaneous pocket over the right pectoralis major muscle. The pocket was flushed with saline vigorously. There was adequate hemostasis. The port catheter was assembled and checked for leakage. The port catheter was secured in the pocket with two retention sutures. The tubing was tunneled subcutaneously to the right venotomy site and inserted into the SVC/RA junction through a valved peel-away sheath. Position was confirmed with fluoroscopy. Images were obtained for documentation. The patient tolerated the procedure well. No immediate complications. Incisions were closed in a two layer fashion with 4 - 0 Vicryl suture. Dermabond was applied to the skin. The port catheter was accessed, blood was aspirated followed by saline and heparin flushes. Needle was removed. A dry sterile dressing was applied. IMPRESSION: Ultrasound and fluoroscopically guided right internal jugular single lumen power port catheter insertion.  Tip in the SVC/RA junction. Catheter ready for use. Electronically Signed   By: Jerilynn Mages.  Shick M.D.    On: 04/28/2021 10:23   ? ?Medications: I have reviewed the patient's current medications. ? ?Assessment/Plan: ?Rectal cancer-clinical stage IV(T4aN2, M1) ?Sigmoidoscopy 04/16/2021-mass at 4 cm from the anal verge, invasive moderately differentiated adenocarcinoma with lymphovascular invasion ?CT angio abdomen/pelvis-bulky irregular rectal wall thickening with perirectal lymphadenopathy and 2 indeterminate liver lesions ?MRI liver 04/17/2021-3 discrete right liver metastases ?MRI pelvis 04/17/2021-T4aN2 rectal tumor-10 cm from the anal verge, involvement of left anterior peritoneal reflection, numerous mesorectal lymph nodes within the pelvis, no evidence of extra mesorectal adenopathy greater than 10 nodes in the mesorectum ?CT chest 04/18/2021-no evidence of metastatic disease ?Elevated CEA (14.5 on 04/17/2021) ?Cycle 1 FOLFOX 04/30/2021 ?Rectal bleeding and pain secondary to #1 ?Chronic left sciatica ?Bipolar disorder ?Right testicular fullness noted on exam 04/21/2021-epididymal cyst? ?Port-A-Cath placement Interventional Radiology 04/28/2021 ?  ? ?Disposition: Mr. Jeremiah Allen appears stable.  He is scheduled to begin treatment today with cycle 1 FOLFOX.  We again reviewed potential toxicities.  He agrees to proceed.  Plan to proceed with cycle 1 FOLFOX today as scheduled. ? ?CBC and chemistry panel from 04/16/2021 and 04/17/2021 reviewed.  Labs adequate to proceed with treatment. ? ?He will return for lab, follow-up, cycle 2 FOLFOX in 2 weeks.  He will contact the office in the interim with any problems. ? ? ? ?Ned Card ANP/GNP-BC  ? ?04/30/2021  ?8:37 AM ? ? ? ? ? ? ? ?

## 2021-04-30 NOTE — Progress Notes (Signed)
36 yo male diagnosed with rectal cancer and followed by Dr. Benay Spice. He is receiving Cycle 1 FOLFOX today. ? ?PMH includes Bipolar disorder and Encephalitis. ? ?Medications include Zofran, Compazine, Abilify, and Carafate. ? ?Labs reviewed. ? ?Height: 69 inches. ?Weight: 143 pounds. ?UBW: 150-155 pounds per patient. Weighed 165 pounds in July 2022. ?BMI: 21.2. ? ?Patient denies nausea, vomiting and difficulty chewing or swallowing. He reports frequent loose stools for well over 1 year. States a good day is 7-8 stools and a bad day is 15-20 stools. ?Reports he typically does not eat breakfast. He will eat lunch and dinner. He loves fruit and a few vegetables (green beans and broccoli). Prefers fish, chicken or ground beef. Reports drinking Sprite, cranberry and apple juice. (Has an immediate BM after juice intake) He also drinks coffee in the am. Reports lactose intolerance but does well with lactaid milk. Has purchased CIB and tolerates well. ? ?Nutrition Diagnosis:  ?Food and Nutrition Related Knowledge Deficit related to new diagnosis of rectal cancer as evidenced by no prior need for nutrition related information. ? ?Intervention: ?Educated on importance of small, frequent meals and snacks.  ?Educated on low fiber diet and provided written fact sheet. ?Educated on high calorie, high protein foods within low fiber restrictions and provided written fact sheets. ?Continue CIB between meals. ?Increase water intake to minimum of 64 oz daily. ?Brief reminder of cold sensitivity from Oxaliplatin and strategies for eating room temperature foods/beverages. ?Questions answered and teach back method used. ? ?Monitoring, Evaluation, Goals: ?Patient will tolerate low fiber diet with adequate calories and protein for weight maintenance. ? ?Next Visit: ?Patient will contact RD with questions. ?

## 2021-04-30 NOTE — Patient Instructions (Signed)
The chemotherapy medication bag should finish at 46 hours, 96 hours, or 7 days. For example, if your pump is scheduled for 46 hours and it was put on at 4:00 p.m., it should finish at 2:00 p.m. the day it is scheduled to come off regardless of your appointment time.   ?  ?Estimated time to finish at 10:30am on Friday 05/02/21. ?  ?If the display on your pump reads "Low Volume" and it is beeping, take the batteries out of the pump and come to the cancer center for it to be taken off.  ? ?If the pump alarms go off prior to the pump reading "Low Volume" then call [phone] and someone can assist you. ? ?If the plunger comes out and the chemotherapy medication is leaking out, please use your home chemo spill kit to clean up the spill. Do NOT use paper towels or other household products. ? ?If you have problems or questions regarding your pump, please call either [phone] (24 hours a day) or the cancer center Monday-Friday 8:00 a.m.- 4:30 p.m. at the clinic number and we will assist you. If you are unable to get assistance, then go to the nearest Emergency Department and ask the staff to contact the IV team for assistance. ? ?[place]  Discharge Instructions: ?Thank you for choosing [facility] to provide your oncology and hematology care.  ? ?If you have a lab appointment with the [facility], please go directly to the [facility] and check in at the registration area. ?  ?Wear comfortable clothing and clothing appropriate for easy access to any Portacath or PICC line.  ? ?We strive to give you quality time with your provider. You may need to reschedule your appointment if you arrive late (15 or more minutes).  Arriving late affects you and other patients whose appointments are after yours.  Also, if you miss three or more appointments without notifying the office, you may be dismissed from the clinic at the provider?s discretion.    ?  ?For prescription refill  requests, have your pharmacy contact our office and allow 72 hours for refills to be completed.   ? ?Today you received the following chemotherapy and/or immunotherapy agents: oxaliplatin, leucovorin, fluorouracil.  ? ?Oxaliplatin Injection ?What is this medication? ?OXALIPLATIN (ox AL i PLA tin) is a chemotherapy drug. It targets fast dividing cells, like cancer cells, and causes these cells to die. This medicine is used to treat cancers of the colon and rectum, and many other cancers. ?This medicine may be used for other purposes; ask your health care provider or pharmacist if you have questions. ?COMMON BRAND NAME(S): Eloxatin ?What should I tell my care team before I take this medication? ?They need to know if you have any of these conditions: ?heart disease ?history of irregular heartbeat ?liver disease ?low blood counts, like white cells, platelets, or red blood cells ?lung or breathing disease, like asthma ?take medicines that treat or prevent blood clots ?tingling of the fingers or toes, or other nerve disorder ?an unusual or allergic reaction to oxaliplatin, other chemotherapy, other medicines, foods, dyes, or preservatives ?pregnant or trying to get pregnant ?breast-feeding ?How should I use this medication? ?This drug is given as an infusion into a vein. It is administered in a hospital or clinic by a specially trained health care professional. ?Talk to your pediatrician regarding the use of this medicine in children. Special care may be needed. ?Overdosage: If you think you  have taken too much of this medicine contact a poison control center or emergency room at once. ?NOTE: This medicine is only for you. Do not share this medicine with others. ?What if I miss a dose? ?It is important not to miss a dose. Call your doctor or health care professional if you are unable to keep an appointment. ?What may interact with this medication? ?Do not take this medicine with any of the following  medications: ?cisapride ?dronedarone ?pimozide ?thioridazine ?This medicine may also interact with the following medications: ?aspirin and aspirin-like medicines ?certain medicines that treat or prevent blood clots like warfarin, apixaban, dabigatran, and rivaroxaban ?cisplatin ?cyclosporine ?diuretics ?medicines for infection like acyclovir, adefovir, amphotericin B, bacitracin, cidofovir, foscarnet, ganciclovir, gentamicin, pentamidine, vancomycin ?NSAIDs, medicines for pain and inflammation, like ibuprofen or naproxen ?other medicines that prolong the QT interval (an abnormal heart rhythm) ?pamidronate ?zoledronic acid ?This list may not describe all possible interactions. Give your health care provider a list of all the medicines, herbs, non-prescription drugs, or dietary supplements you use. Also tell them if you smoke, drink alcohol, or use illegal drugs. Some items may interact with your medicine. ?What should I watch for while using this medication? ?Your condition will be monitored carefully while you are receiving this medicine. ?You may need blood work done while you are taking this medicine. ?This medicine may make you feel generally unwell. This is not uncommon as chemotherapy can affect healthy cells as well as cancer cells. Report any side effects. Continue your course of treatment even though you feel ill unless your healthcare professional tells you to stop. ?This medicine can make you more sensitive to cold. Do not drink cold drinks or use ice. Cover exposed skin before coming in contact with cold temperatures or cold objects. When out in cold weather wear warm clothing and cover your mouth and nose to warm the air that goes into your lungs. Tell your doctor if you get sensitive to the cold. ?Do not become pregnant while taking this medicine or for 9 months after stopping it. Women should inform their health care professional if they wish to become pregnant or think they might be pregnant. Men  should not father a child while taking this medicine and for 6 months after stopping it. There is potential for serious side effects to an unborn child. Talk to your health care professional for more information. ?Do not breast-feed a child while taking this medicine or for 3 months after stopping it. ?This medicine has caused ovarian failure in some women. This medicine may make it more difficult to get pregnant. Talk to your health care professional if you are concerned about your fertility. ?This medicine has caused decreased sperm counts in some men. This may make it more difficult to father a child. Talk to your health care professional if you are concerned about your fertility. ?This medicine may increase your risk of getting an infection. Call your health care professional for advice if you get a fever, chills, or sore throat, or other symptoms of a cold or flu. Do not treat yourself. Try to avoid being around people who are sick. ?Avoid taking medicines that contain aspirin, acetaminophen, ibuprofen, naproxen, or ketoprofen unless instructed by your health care professional. These medicines may hide a fever. ?Be careful brushing or flossing your teeth or using a toothpick because you may get an infection or bleed more easily. If you have any dental work done, tell your dentist you are receiving this medicine. ?What side  effects may I notice from receiving this medication? ?Side effects that you should report to your doctor or health care professional as soon as possible: ?allergic reactions like skin rash, itching or hives, swelling of the face, lips, or tongue ?breathing problems ?cough ?low blood counts - this medicine may decrease the number of white blood cells, red blood cells, and platelets. You may be at increased risk for infections and bleeding ?nausea, vomiting ?pain, redness, or irritation at site where injected ?pain, tingling, numbness in the hands or feet ?signs and symptoms of bleeding such as  bloody or black, tarry stools; red or dark brown urine; spitting up blood or brown material that looks like coffee grounds; red spots on the skin; unusual bruising or bleeding from the eyes, gums, or nose ?signs and symptoms

## 2021-04-30 NOTE — Progress Notes (Signed)
Patient seen by Ned Card NP today ? ?Vitals are within treatment parameters. ? ?Labs reviewed by Ned Card NP and are within treatment parameters. ? ?OK to treat based on CBC/CMP from 3/15 and 3/15--1st treatment today. ? ?Per physician team, patient is ready for treatment and there are NO modifications to the treatment plan.  ?

## 2021-05-01 ENCOUNTER — Encounter: Payer: Self-pay | Admitting: *Deleted

## 2021-05-01 NOTE — Progress Notes (Signed)
PATIENT NAVIGATOR PROGRESS NOTE ? ?Name: Jeremiah Allen ?Date: 05/01/2021 ?MRN: [id number]  ?DOB: 1986/01/11 ? ? ?Reason for visit:  ?F/U call after first treatment ? ?Comments:  Called today for first FOLFOX treatment. Left message for patient to call back with any issues or questions ? ? ? ?Time spent counseling/coordinating care: 15-30 minutes ? ?

## 2021-05-02 ENCOUNTER — Other Ambulatory Visit: Payer: Self-pay | Admitting: Nurse Practitioner

## 2021-05-02 ENCOUNTER — Ambulatory Visit (HOSPITAL_BASED_OUTPATIENT_CLINIC_OR_DEPARTMENT_OTHER)
Admission: RE | Admit: 2021-05-02 | Discharge: 2021-05-02 | Disposition: A | Discharge status: Home / Self Care | Payer: Self-pay | Source: Ambulatory Visit | Attending: Nurse Practitioner | Admitting: Nurse Practitioner

## 2021-05-02 ENCOUNTER — Encounter: Payer: Self-pay | Admitting: *Deleted

## 2021-05-02 ENCOUNTER — Inpatient Hospital Stay: Payer: Self-pay

## 2021-05-02 VITALS — BP 116/77 | HR 69 | Temp 98.1°F | Resp 18

## 2021-05-02 DIAGNOSIS — C20 Malignant neoplasm of rectum: Secondary | ICD-10-CM | POA: Insufficient documentation

## 2021-05-02 MED ORDER — SODIUM CHLORIDE 0.9% FLUSH
10.0000 mL | INTRAVENOUS | Status: DC | PRN
  Administered 2021-05-02 (×2): 10 mL

## 2021-05-02 MED ORDER — SODIUM CHLORIDE 0.9 % IV SOLN: Freq: Once | INTRAVENOUS | Status: AC

## 2021-05-02 MED ORDER — HEPARIN SOD (PORK) LOCK FLUSH 100 UNIT/ML IV SOLN
500.0000 [IU] | Freq: Once | INTRAVENOUS | Status: AC | PRN
  Administered 2021-05-02: 500 [IU]

## 2021-05-02 MED ORDER — TRAMADOL HCL 50 MG PO TABS: 50.0000 mg | ORAL_TABLET | Freq: Three times a day (TID) | ORAL | 0 refills | Status: DC | PRN

## 2021-05-02 NOTE — Progress Notes (Signed)
PATIENT NAVIGATOR PROGRESS NOTE ? ?Name: Zaydyn Havey ?Date: 05/02/2021 ?MRN: [id number]  ?DOB: [birth year] ? ? ?Reason for visit:  ?Pump D/C visit ? ?Comments:  Patient here for pump D/C after first treatment. He describes abdominal pain of 7/10 pain scale predominantly on left side of abdomen. He describes the pain as sharp not cramping and a feeling of fullness like he needs to have BM and was up most of the night going to and from bathroom with small BM results which he describes as having some blood in it. No fever.   ?He said that he is nauseous but has not thrown up.  ? ?Reviewed with Ned Card NP and he will receive IV fluids today and be evaluated today in infusion room.  ? ?Plan discussed with pt and his SO Josey and they agree.   ? ? ? ?Time spent counseling/coordinating care: > 60 minutes ? ?

## 2021-05-02 NOTE — Progress Notes (Signed)
Pt returns for pump stop. Complaining of nausea and stomach ache.  Spoke with Berneta Sages, RN Navigator. Orders written. ?

## 2021-05-02 NOTE — Progress Notes (Signed)
Jeremiah Allen presents to the office today for pump discontinuation.  He reports pain below the umbilicus beginning after he arrived home 04/29/2021.  He then developed rectal symptoms with frequency/urgency.  He has not seen much stool, some blood.  He denies fever.  Vital signs stable.  Tender inferior to the umbilicus.  Abdominal x-ray with moderate stool burden, no abnormal bowel dilatation.  He will begin MiraLAX, increase the stool softener to twice daily, follow a low residual diet.  Prescription sent to his pharmacy for tramadol.  He will contact the office if symptoms worsen. ?

## 2021-05-02 NOTE — Addendum Note (Signed)
Addended by: Owens Shark on: 05/02/2021 01:22 PM ? ? Modules accepted: Orders ? ?

## 2021-05-06 ENCOUNTER — Encounter: Payer: Self-pay | Admitting: Licensed Clinical Social Worker

## 2021-05-06 ENCOUNTER — Inpatient Hospital Stay: Payer: Self-pay | Attending: Oncology | Admitting: Licensed Clinical Social Worker

## 2021-05-06 DIAGNOSIS — Z5189 Encounter for other specified aftercare: Secondary | ICD-10-CM | POA: Insufficient documentation

## 2021-05-06 DIAGNOSIS — Z5111 Encounter for antineoplastic chemotherapy: Secondary | ICD-10-CM | POA: Insufficient documentation

## 2021-05-06 DIAGNOSIS — C787 Secondary malignant neoplasm of liver and intrahepatic bile duct: Secondary | ICD-10-CM | POA: Insufficient documentation

## 2021-05-06 DIAGNOSIS — Z452 Encounter for adjustment and management of vascular access device: Secondary | ICD-10-CM | POA: Insufficient documentation

## 2021-05-06 DIAGNOSIS — C20 Malignant neoplasm of rectum: Secondary | ICD-10-CM | POA: Insufficient documentation

## 2021-05-06 NOTE — Progress Notes (Signed)
[facility] CSW Progress Note ? ?Clinical Social Worker met with patient to discuss coping skills.  Patient came with his mother Butch Penny who is one of two main care givers.  Patient was in good spirits and was willing and open to share concerns.  CSW and patient discussed ways to cope with anxiety and fears, due to diagnosis.  CSW and patient discussed different methods to address anxiety and rumination in the morning when anxiety is worse.  CSW and patient discussed mindfulness practice and different mindfulness skills to address external agitations which increase anxiety and fears.  CSW gave patient a list of different skills to practice to manage anxiety and address adjustment concerns.  Patient, Butch Penny and CSW discussed how to have conversations about next steps and setting boundaries for these discussions. Patient scheduled appointment for April 18th, 2023. ? ? ?Gailyn Crook , LCSW ?

## 2021-05-09 ENCOUNTER — Encounter: Payer: Self-pay | Admitting: Oncology

## 2021-05-11 ENCOUNTER — Other Ambulatory Visit: Payer: Self-pay | Admitting: Oncology

## 2021-05-12 ENCOUNTER — Encounter: Payer: Self-pay | Admitting: Oncology

## 2021-05-12 NOTE — Progress Notes (Signed)
[facility] CSW Progress Note ? ?Clinical Social Worker  met with patient and mother  to speak about adjustment issues.  Not written on meeting date. ? ? ? ?Makiah Clauson LCSW, LCSW ?

## 2021-05-14 ENCOUNTER — Inpatient Hospital Stay: Payer: Self-pay

## 2021-05-14 ENCOUNTER — Other Ambulatory Visit: Payer: Self-pay | Admitting: Oncology

## 2021-05-14 ENCOUNTER — Other Ambulatory Visit (HOSPITAL_BASED_OUTPATIENT_CLINIC_OR_DEPARTMENT_OTHER): Payer: Self-pay

## 2021-05-14 ENCOUNTER — Encounter: Payer: Self-pay | Admitting: *Deleted

## 2021-05-14 ENCOUNTER — Inpatient Hospital Stay (HOSPITAL_BASED_OUTPATIENT_CLINIC_OR_DEPARTMENT_OTHER): Payer: Self-pay | Admitting: Oncology

## 2021-05-14 ENCOUNTER — Encounter: Payer: Self-pay | Admitting: Oncology

## 2021-05-14 VITALS — BP 113/63 | HR 73 | Temp 97.8°F | Resp 20 | Ht 69.0 in | Wt 152.6 lb

## 2021-05-14 DIAGNOSIS — C20 Malignant neoplasm of rectum: Secondary | ICD-10-CM

## 2021-05-14 LAB — CBC WITH DIFFERENTIAL (CANCER CENTER ONLY)
Abs Immature Granulocytes: 0.01 10*3/uL (ref 0.00–0.07)
Basophils Absolute: 0 10*3/uL (ref 0.0–0.1)
Basophils Relative: 1 %
Eosinophils Absolute: 0.1 10*3/uL (ref 0.0–0.5)
Eosinophils Relative: 3 %
HCT: 31.6 % — ABNORMAL LOW (ref 39.0–52.0)
Hemoglobin: 9.9 g/dL — ABNORMAL LOW (ref 13.0–17.0)
Immature Granulocytes: 0 %
Lymphocytes Relative: 36 %
Lymphs Abs: 1.2 10*3/uL (ref 0.7–4.0)
MCH: 29.6 pg (ref 26.0–34.0)
MCHC: 31.3 g/dL (ref 30.0–36.0)
MCV: 94.6 fL (ref 80.0–100.0)
Monocytes Absolute: 0.4 10*3/uL (ref 0.1–1.0)
Monocytes Relative: 10 %
Neutro Abs: 1.7 10*3/uL (ref 1.7–7.7)
Neutrophils Relative %: 50 %
Platelet Count: 251 10*3/uL (ref 150–400)
RBC: 3.34 MIL/uL — ABNORMAL LOW (ref 4.22–5.81)
RDW: 14.1 % (ref 11.5–15.5)
WBC Count: 3.4 10*3/uL — ABNORMAL LOW (ref 4.0–10.5)
nRBC: 0 % (ref 0.0–0.2)

## 2021-05-14 LAB — CMP (CANCER CENTER ONLY)
ALT: 22 U/L (ref 0–44)
AST: 20 U/L (ref 15–41)
Albumin: 4.1 g/dL (ref 3.5–5.0)
Alkaline Phosphatase: 69 U/L (ref 38–126)
Anion gap: 7 (ref 5–15)
BUN: 8 mg/dL (ref 6–20)
CO2: 28 mmol/L (ref 22–32)
Calcium: 9.4 mg/dL (ref 8.9–10.3)
Chloride: 106 mmol/L (ref 98–111)
Creatinine: 0.8 mg/dL (ref 0.61–1.24)
GFR, Estimated: >60 mL/min (ref >60)
Glucose, Bld: 91 mg/dL (ref 70–99)
Potassium: 4.3 mmol/L (ref 3.5–5.1)
Sodium: 141 mmol/L (ref 135–145)
Total Bilirubin: 0.3 mg/dL (ref 0.3–1.2)
Total Protein: 6.8 g/dL (ref 6.5–8.1)

## 2021-05-14 MED ORDER — FLUOROURACIL CHEMO INJECTION 2.5 GM/50ML
400.0000 mg/m2 | Freq: Once | INTRAVENOUS | Status: AC
  Administered 2021-05-14: 700 mg via INTRAVENOUS
  Filled 2021-05-14: qty 14

## 2021-05-14 MED ORDER — LEUCOVORIN CALCIUM INJECTION 350 MG
400.0000 mg/m2 | Freq: Once | INTRAVENOUS | Status: AC
  Administered 2021-05-14: 708 mg via INTRAVENOUS
  Filled 2021-05-14: qty 35.4

## 2021-05-14 MED ORDER — TRAMADOL HCL 50 MG PO TABS
50.0000 mg | ORAL_TABLET | Freq: Three times a day (TID) | ORAL | 0 refills | Status: DC | PRN
  Filled 2021-05-14: qty 30, 10d supply, fill #0

## 2021-05-14 MED ORDER — PALONOSETRON HCL INJECTION 0.25 MG/5ML
0.2500 mg | Freq: Once | INTRAVENOUS | Status: AC
  Administered 2021-05-14: 0.25 mg via INTRAVENOUS
  Filled 2021-05-14: qty 5

## 2021-05-14 MED ORDER — OXALIPLATIN CHEMO INJECTION 100 MG/20ML
85.0000 mg/m2 | Freq: Once | INTRAVENOUS | Status: AC
  Administered 2021-05-14: 150 mg via INTRAVENOUS
  Filled 2021-05-14: qty 20

## 2021-05-14 MED ORDER — SODIUM CHLORIDE 0.9 % IV SOLN
2400.0000 mg/m2 | INTRAVENOUS | Status: DC
  Administered 2021-05-14: 4250 mg via INTRAVENOUS
  Filled 2021-05-14: qty 85

## 2021-05-14 MED ORDER — DEXTROSE 5 % IV SOLN: Freq: Once | INTRAVENOUS | Status: AC

## 2021-05-14 MED ORDER — SODIUM CHLORIDE 0.9 % IV SOLN
10.0000 mg | Freq: Once | INTRAVENOUS | Status: AC
  Administered 2021-05-14: 10 mg via INTRAVENOUS
  Filled 2021-05-14: qty 1

## 2021-05-14 MED ORDER — TRAMADOL HCL 50 MG PO TABS: 50.0000 mg | ORAL_TABLET | Freq: Three times a day (TID) | ORAL | 0 refills | Status: DC | PRN

## 2021-05-14 NOTE — Progress Notes (Signed)
Patient seen by Dr. Benay Spice today ? ?Vitals are within treatment parameters. ? ?Labs reviewed by Dr. Benay Spice and are within treatment parameters. ? ?Per physician team, patient is ready for treatment. Please note that modifications are being made to the treatment plan including Has added to Udenyca to care plan for next cycle due to lower WBC today .  ? ?

## 2021-05-14 NOTE — Patient Instructions (Addendum)
The chemotherapy medication bag should finish at 46 hours, 96 hours, or 7 days. For example, if your pump is scheduled for 46 hours and it was put on at 4:00 p.m., it should finish at 2:00 p.m. the day it is scheduled to come off regardless of your appointment time.   ?  ?Estimated time to finish at 1:00 pm on Friday 05/16/21. ?  ?If the display on your pump reads "Low Volume" and it is beeping, take the batteries out of the pump and come to the cancer center for it to be taken off.  ? ?If the pump alarms go off prior to the pump reading "Low Volume" then call [phone] and someone can assist you. ? ?If the plunger comes out and the chemotherapy medication is leaking out, please use your home chemo spill kit to clean up the spill. Do NOT use paper towels or other household products. ? ?If you have problems or questions regarding your pump, please call either [phone] (24 hours a day) or the cancer center Monday-Friday 8:00 a.m.- 4:30 p.m. at the clinic number and we will assist you. If you are unable to get assistance, then go to the nearest Emergency Department and ask the staff to contact the IV team for assistance. ? ?[facility]  Discharge Instructions: ?Thank you for choosing [facility] to provide your oncology and hematology care.  ? ?If you have a lab appointment with the [facility], please go directly to the [facility] and check in at the registration area. ?  ?Wear comfortable clothing and clothing appropriate for easy access to any Portacath or PICC line.  ? ?We strive to give you quality time with your provider. You may need to reschedule your appointment if you arrive late (15 or more minutes).  Arriving late affects you and other patients whose appointments are after yours.  Also, if you miss three or more appointments without notifying the office, you may be dismissed from the clinic at the provider?s discretion.    ?  ?For prescription refill  requests, have your pharmacy contact our office and allow 72 hours for refills to be completed.   ? ?Today you received the following chemotherapy and/or immunotherapy agents: oxaliplatin, leucovorin, fluorouracil.  ? ?Oxaliplatin Injection ?What is this medication? ?OXALIPLATIN (ox AL i PLA tin) is a chemotherapy drug. It targets fast dividing cells, like cancer cells, and causes these cells to die. This medicine is used to treat cancers of the colon and rectum, and many other cancers. ?This medicine may be used for other purposes; ask your health care provider or pharmacist if you have questions. ?COMMON BRAND NAME(S): Eloxatin ?What should I tell my care team before I take this medication? ?They need to know if you have any of these conditions: ?heart disease ?history of irregular heartbeat ?liver disease ?low blood counts, like white cells, platelets, or red blood cells ?lung or breathing disease, like asthma ?take medicines that treat or prevent blood clots ?tingling of the fingers or toes, or other nerve disorder ?an unusual or allergic reaction to oxaliplatin, other chemotherapy, other medicines, foods, dyes, or preservatives ?pregnant or trying to get pregnant ?breast-feeding ?How should I use this medication? ?This drug is given as an infusion into a vein. It is administered in a hospital or clinic by a specially trained health care professional. ?Talk to your pediatrician regarding the use of this medicine in children. Special care may be needed. ?Overdosage: If you think  you have taken too much of this medicine contact a poison control center or emergency room at once. ?NOTE: This medicine is only for you. Do not share this medicine with others. ?What if I miss a dose? ?It is important not to miss a dose. Call your doctor or health care professional if you are unable to keep an appointment. ?What may interact with this medication? ?Do not take this medicine with any of the following  medications: ?cisapride ?dronedarone ?pimozide ?thioridazine ?This medicine may also interact with the following medications: ?aspirin and aspirin-like medicines ?certain medicines that treat or prevent blood clots like warfarin, apixaban, dabigatran, and rivaroxaban ?cisplatin ?cyclosporine ?diuretics ?medicines for infection like acyclovir, adefovir, amphotericin B, bacitracin, cidofovir, foscarnet, ganciclovir, gentamicin, pentamidine, vancomycin ?NSAIDs, medicines for pain and inflammation, like ibuprofen or naproxen ?other medicines that prolong the QT interval (an abnormal heart rhythm) ?pamidronate ?zoledronic acid ?This list may not describe all possible interactions. Give your health care provider a list of all the medicines, herbs, non-prescription drugs, or dietary supplements you use. Also tell them if you smoke, drink alcohol, or use illegal drugs. Some items may interact with your medicine. ?What should I watch for while using this medication? ?Your condition will be monitored carefully while you are receiving this medicine. ?You may need blood work done while you are taking this medicine. ?This medicine may make you feel generally unwell. This is not uncommon as chemotherapy can affect healthy cells as well as cancer cells. Report any side effects. Continue your course of treatment even though you feel ill unless your healthcare professional tells you to stop. ?This medicine can make you more sensitive to cold. Do not drink cold drinks or use ice. Cover exposed skin before coming in contact with cold temperatures or cold objects. When out in cold weather wear warm clothing and cover your mouth and nose to warm the air that goes into your lungs. Tell your doctor if you get sensitive to the cold. ?Do not become pregnant while taking this medicine or for 9 months after stopping it. Women should inform their health care professional if they wish to become pregnant or think they might be pregnant. Men  should not father a child while taking this medicine and for 6 months after stopping it. There is potential for serious side effects to an unborn child. Talk to your health care professional for more information. ?Do not breast-feed a child while taking this medicine or for 3 months after stopping it. ?This medicine has caused ovarian failure in some women. This medicine may make it more difficult to get pregnant. Talk to your health care professional if you are concerned about your fertility. ?This medicine has caused decreased sperm counts in some men. This may make it more difficult to father a child. Talk to your health care professional if you are concerned about your fertility. ?This medicine may increase your risk of getting an infection. Call your health care professional for advice if you get a fever, chills, or sore throat, or other symptoms of a cold or flu. Do not treat yourself. Try to avoid being around people who are sick. ?Avoid taking medicines that contain aspirin, acetaminophen, ibuprofen, naproxen, or ketoprofen unless instructed by your health care professional. These medicines may hide a fever. ?Be careful brushing or flossing your teeth or using a toothpick because you may get an infection or bleed more easily. If you have any dental work done, tell your dentist you are receiving this medicine. ?What  side effects may I notice from receiving this medication? ?Side effects that you should report to your doctor or health care professional as soon as possible: ?allergic reactions like skin rash, itching or hives, swelling of the face, lips, or tongue ?breathing problems ?cough ?low blood counts - this medicine may decrease the number of white blood cells, red blood cells, and platelets. You may be at increased risk for infections and bleeding ?nausea, vomiting ?pain, redness, or irritation at site where injected ?pain, tingling, numbness in the hands or feet ?signs and symptoms of bleeding such as  bloody or black, tarry stools; red or dark brown urine; spitting up blood or brown material that looks like coffee grounds; red spots on the skin; unusual bruising or bleeding from the eyes, gums, or nose ?signs and symptoms

## 2021-05-14 NOTE — Patient Instructions (Signed)

## 2021-05-14 NOTE — Progress Notes (Signed)
Patient presents for treatment. RN assessment completed along with the following: ? ?Labs/vitals reviewed - Yes, and within treatment parameters.   ?Weight within 10% of previous measurement - Yes ?Informed consent completed and reflects current therapy/intent - Yes, on date 04/30/21             ?Provider progress note reviewed - Yes, today's provider note was reviewed. ?Treatment/Antibody/Supportive plan reviewed - Yes, and Per [facility] RN note, Udenyca to be added to day 3 of cycle ?S&H and other orders reviewed - Yes, and there are no additional orders identified. ?Previous treatment date reviewed - Yes, and the appropriate amount of time has elapsed between treatments. ?Clinic Hand Off Received from - [facility] ? ?Patient to proceed with treatment.  ? ?

## 2021-05-14 NOTE — Progress Notes (Signed)
?  [facility] ?OFFICE PROGRESS NOTE ? ? ?Diagnosis: Rectal cancer ? ?INTERVAL HISTORY:  ? ?Mr. Nieland completed cycle 1 FOLFOX 04/30/2021.  He had nausea lasting 4 to 5 days following chemotherapy.  No emesis.  Zofran helped partially.  He had cold sensitivity on day 1.  No other neuropathy symptoms.  He reports soreness in the mouth.  He continues to have frequent bowel movements.  These are now formed.  He has persistent hemorrhoid and sciatica pain. ? ? ? ?Objective: ? ?Vital signs in last 24 hours: ? ?Blood pressure 113/63, pulse 73, temperature 97.8 ?F (36.6 ?C), temperature source Oral, resp. rate 20, height 5' 9" (1.753 m), weight 152 lb 9.6 oz (69.2 kg), SpO2 98 %. ?  ? ?HEENT: Mild white coat over the tongue, no buccal thrush or ulcers ?Resp: Lungs clear bilaterally ?Cardio: Regular rate and rhythm ?GI: No hepatosplenomegaly, mild tenderness in the right lower abdomen ?Vascular: No leg edema  ?Skin: Palms without erythema ? ?Portacath/PICC-without erythema ? ?Lab Results: ? ?Lab Results  ?Component Value Date  ? WBC 3.4 (L) 05/14/2021  ? HGB 9.9 (L) 05/14/2021  ? HCT 31.6 (L) 05/14/2021  ? MCV 94.6 05/14/2021  ? PLT 251 05/14/2021  ? NEUTROABS 1.7 05/14/2021  ? ? ?CMP  ?Lab Results  ?Component Value Date  ? NA 141 05/14/2021  ? K 4.3 05/14/2021  ? CL 106 05/14/2021  ? CO2 28 05/14/2021  ? GLUCOSE 91 05/14/2021  ? BUN 8 05/14/2021  ? CREATININE 0.80 05/14/2021  ? CALCIUM 9.4 05/14/2021  ? PROT 6.8 05/14/2021  ? ALBUMIN 4.1 05/14/2021  ? AST 20 05/14/2021  ? ALT 22 05/14/2021  ? ALKPHOS 69 05/14/2021  ? BILITOT 0.3 05/14/2021  ? GFRNONAA >60 05/14/2021  ? ? ?Lab Results  ?Component Value Date  ? CEA1 14.5 (H) 04/17/2021  ? ? ? ? ?Medications: I have reviewed the patient's current medications. ? ? ?Assessment/Plan: ?Rectal cancer-clinical stage IV(T4aN2, M1) ?Sigmoidoscopy 04/16/2021-mass at 4 cm from the anal verge, invasive moderately differentiated adenocarcinoma with lymphovascular  invasion ?OmniSeq-MSS, tumor mutation burden 3.9, PD-L1 less than 1%, K-ras G12 D ?CT angio abdomen/pelvis-bulky irregular rectal wall thickening with perirectal lymphadenopathy and 2 indeterminate liver lesions ?MRI liver 04/17/2021-3 discrete right liver metastases ?MRI pelvis 04/17/2021-T4aN2 rectal tumor-10 cm from the anal verge, involvement of left anterior peritoneal reflection, numerous mesorectal lymph nodes within the pelvis, no evidence of extra mesorectal adenopathy greater than 10 nodes in the mesorectum ?CT chest 04/18/2021-no evidence of metastatic disease ?Elevated CEA (14.5 on 04/17/2021) ?Cycle 1 FOLFOX 04/30/2021 ?Cycle 2 FOLFOX 05/14/2021 ?Rectal bleeding and pain secondary to #1 ?Chronic left sciatica ?Bipolar disorder ?Right testicular fullness noted on exam 04/21/2021-epididymal cyst? ?Port-A-Cath placement Interventional Radiology 04/28/2021 ?  ? ? ? ?Disposition: ?Mr. Cooperman tolerated the first cycle of FOLFOX well.  He will complete cycle 2 today.  The white count is borderline low today.  He call for a fever.  Ellen Henri will be added with cycle 3. ?Mr. Raupp will continue tramadol as needed for pain. ? ?We reviewed results of next generation sequencing testing today.  The results do not alter the recommendation for total neoadjuvant therapy. ? ?He will return for an office visit and chemotherapy in 2 weeks. ? ?Betsy Coder, MD ? ?05/14/2021  ?10:47 AM ? ? ?

## 2021-05-15 ENCOUNTER — Telehealth: Payer: Self-pay

## 2021-05-15 NOTE — Telephone Encounter (Signed)
Called and left message to check on patient.  Instructed patient to call office if he had any questions or concerns.  Patient scheduled for chemo pump removal on 05/16/21 ?

## 2021-05-16 ENCOUNTER — Inpatient Hospital Stay: Payer: Self-pay

## 2021-05-16 VITALS — BP 107/58 | HR 70 | Temp 97.3°F | Resp 20

## 2021-05-16 DIAGNOSIS — C20 Malignant neoplasm of rectum: Secondary | ICD-10-CM

## 2021-05-16 MED ORDER — SODIUM CHLORIDE 0.9% FLUSH
10.0000 mL | INTRAVENOUS | Status: DC | PRN
  Administered 2021-05-16: 10 mL

## 2021-05-16 MED ORDER — HEPARIN SOD (PORK) LOCK FLUSH 100 UNIT/ML IV SOLN
500.0000 [IU] | Freq: Once | INTRAVENOUS | Status: AC | PRN
  Administered 2021-05-16: 500 [IU]

## 2021-05-20 ENCOUNTER — Inpatient Hospital Stay: Payer: Self-pay | Admitting: Licensed Clinical Social Worker

## 2021-05-20 DIAGNOSIS — C20 Malignant neoplasm of rectum: Secondary | ICD-10-CM

## 2021-05-22 ENCOUNTER — Encounter: Payer: Self-pay | Admitting: Oncology

## 2021-05-22 NOTE — Progress Notes (Signed)
[facility] CSW Progress Note ? ?Clinical Social Worker contacted patient by phone to discuss rescheduling of appointment.  Misunderstanding of appointment time. ? ? ? ?Marlis Oldaker LCSW, LCSW ?

## 2021-05-24 ENCOUNTER — Other Ambulatory Visit: Payer: Self-pay | Admitting: Oncology

## 2021-05-28 ENCOUNTER — Other Ambulatory Visit: Payer: Self-pay | Admitting: *Deleted

## 2021-05-28 ENCOUNTER — Encounter: Payer: Self-pay | Admitting: Oncology

## 2021-05-28 ENCOUNTER — Inpatient Hospital Stay (HOSPITAL_BASED_OUTPATIENT_CLINIC_OR_DEPARTMENT_OTHER): Payer: Self-pay | Admitting: Nurse Practitioner

## 2021-05-28 ENCOUNTER — Inpatient Hospital Stay: Payer: Self-pay

## 2021-05-28 ENCOUNTER — Encounter: Payer: Self-pay | Admitting: Nurse Practitioner

## 2021-05-28 ENCOUNTER — Other Ambulatory Visit (HOSPITAL_BASED_OUTPATIENT_CLINIC_OR_DEPARTMENT_OTHER): Payer: Self-pay

## 2021-05-28 ENCOUNTER — Encounter: Payer: Self-pay | Admitting: *Deleted

## 2021-05-28 VITALS — BP 115/58 | HR 70 | Temp 98.2°F | Resp 18 | Ht 69.0 in | Wt 155.6 lb

## 2021-05-28 DIAGNOSIS — C20 Malignant neoplasm of rectum: Secondary | ICD-10-CM

## 2021-05-28 LAB — CBC WITH DIFFERENTIAL (CANCER CENTER ONLY)
Abs Immature Granulocytes: 0.01 10*3/uL (ref 0.00–0.07)
Basophils Absolute: 0 10*3/uL (ref 0.0–0.1)
Basophils Relative: 1 %
Eosinophils Absolute: 0.1 10*3/uL (ref 0.0–0.5)
Eosinophils Relative: 2 %
HCT: 35.4 % — ABNORMAL LOW (ref 39.0–52.0)
Hemoglobin: 11.4 g/dL — ABNORMAL LOW (ref 13.0–17.0)
Immature Granulocytes: 0 %
Lymphocytes Relative: 41 %
Lymphs Abs: 1.4 10*3/uL (ref 0.7–4.0)
MCH: 30.5 pg (ref 26.0–34.0)
MCHC: 32.2 g/dL (ref 30.0–36.0)
MCV: 94.7 fL (ref 80.0–100.0)
Monocytes Absolute: 0.4 10*3/uL (ref 0.1–1.0)
Monocytes Relative: 11 %
Neutro Abs: 1.6 10*3/uL — ABNORMAL LOW (ref 1.7–7.7)
Neutrophils Relative %: 45 %
Platelet Count: 173 10*3/uL (ref 150–400)
RBC: 3.74 MIL/uL — ABNORMAL LOW (ref 4.22–5.81)
RDW: 14.2 % (ref 11.5–15.5)
WBC Count: 3.6 10*3/uL — ABNORMAL LOW (ref 4.0–10.5)
nRBC: 0 % (ref 0.0–0.2)

## 2021-05-28 LAB — CMP (CANCER CENTER ONLY)
ALT: 23 U/L (ref 0–44)
AST: 16 U/L (ref 15–41)
Albumin: 4.3 g/dL (ref 3.5–5.0)
Alkaline Phosphatase: 59 U/L (ref 38–126)
Anion gap: 6 (ref 5–15)
BUN: 12 mg/dL (ref 6–20)
CO2: 29 mmol/L (ref 22–32)
Calcium: 9.8 mg/dL (ref 8.9–10.3)
Chloride: 104 mmol/L (ref 98–111)
Creatinine: 0.89 mg/dL (ref 0.61–1.24)
GFR, Estimated: >60 mL/min (ref >60)
Glucose, Bld: 98 mg/dL (ref 70–99)
Potassium: 4.2 mmol/L (ref 3.5–5.1)
Sodium: 139 mmol/L (ref 135–145)
Total Bilirubin: 0.4 mg/dL (ref 0.3–1.2)
Total Protein: 6.9 g/dL (ref 6.5–8.1)

## 2021-05-28 MED ORDER — SODIUM CHLORIDE 0.9 % IV SOLN
10.0000 mg | Freq: Once | INTRAVENOUS | Status: AC
  Administered 2021-05-28: 10 mg via INTRAVENOUS
  Filled 2021-05-28: qty 1

## 2021-05-28 MED ORDER — TRAMADOL HCL 50 MG PO TABS
50.0000 mg | ORAL_TABLET | Freq: Three times a day (TID) | ORAL | 0 refills | Status: DC | PRN
  Filled 2021-05-28: qty 30, 10d supply, fill #0

## 2021-05-28 MED ORDER — OXALIPLATIN CHEMO INJECTION 100 MG/20ML
85.0000 mg/m2 | Freq: Once | INTRAVENOUS | Status: AC
  Administered 2021-05-28: 150 mg via INTRAVENOUS
  Filled 2021-05-28: qty 10

## 2021-05-28 MED ORDER — FLUOROURACIL CHEMO INJECTION 2.5 GM/50ML
400.0000 mg/m2 | Freq: Once | INTRAVENOUS | Status: AC
  Administered 2021-05-28: 700 mg via INTRAVENOUS
  Filled 2021-05-28: qty 14

## 2021-05-28 MED ORDER — DEXTROSE 5 % IV SOLN: Freq: Once | INTRAVENOUS | Status: AC

## 2021-05-28 MED ORDER — PALONOSETRON HCL INJECTION 0.25 MG/5ML
0.2500 mg | Freq: Once | INTRAVENOUS | Status: AC
  Administered 2021-05-28: 0.25 mg via INTRAVENOUS
  Filled 2021-05-28: qty 5

## 2021-05-28 MED ORDER — SODIUM CHLORIDE 0.9 % IV SOLN
2400.0000 mg/m2 | INTRAVENOUS | Status: DC
  Administered 2021-05-28: 4250 mg via INTRAVENOUS
  Filled 2021-05-28: qty 85

## 2021-05-28 MED ORDER — LEUCOVORIN CALCIUM INJECTION 350 MG
400.0000 mg/m2 | Freq: Once | INTRAVENOUS | Status: AC
  Administered 2021-05-28: 708 mg via INTRAVENOUS
  Filled 2021-05-28: qty 25

## 2021-05-28 NOTE — Progress Notes (Signed)
?  [facility] ?OFFICE PROGRESS NOTE ? ? ?Diagnosis: Rectal cancer ? ?INTERVAL HISTORY:  ? ?Mr. Jeremiah Allen returns as scheduled.  He completed cycle 2 FOLFOX 05/14/2021.  He tends to develop mild nausea on day 3.  The nausea lasts 2 or 3 days, effectively relieved with Compazine and/or Zofran.  No diarrhea.  Possible tongue sore for a few days.  He continues to have frequent small-volume bowel movements.  Rectal pain overall is better since beginning chemotherapy.  He has intermittent right-sided abdominal pain.  He estimates taking tramadol 2 times a day.  Cold sensitivity lasted 2 or 3 days.  No persistent neuropathy symptoms. ? ?He would like to see a psychiatrist for evaluation of ADHD. ? ? ?Objective: ? ?Vital signs in last 24 hours: ? ?Blood pressure (!) 115/58, pulse 70, temperature 98.2 ?F (36.8 ?C), temperature source Oral, resp. rate 18, height _0  (1.753 m), weight 155 lb 9.6 oz (70.6 kg), SpO2 100 %. ?  ? ?HEENT: No thrush or ulcers. ?Resp: Lungs clear bilaterally. ?Cardio: Regular rate and rhythm. ?GI: Abdomen is soft.  Tender at the right upper abdomen.  No hepatomegaly. ?Vascular: No leg edema. ?Neuro: Vibratory sense mildly decreased over the fingertips per tuning fork exam. ?Skin: Palms without erythema. ?Port-A-Cath without erythema. ? ? ?Lab Results: ? ?Lab Results  ?Component Value Date  ? WBC 3.6 (L) 05/28/2021  ? HGB 11.4 (L) 05/28/2021  ? HCT 35.4 (L) 05/28/2021  ? MCV 94.7 05/28/2021  ? PLT 173 05/28/2021  ? NEUTROABS 1.6 (L) 05/28/2021  ? ? ?Imaging: ? ?No results found. ? ?Medications: I have reviewed the patient's current medications. ? ?Assessment/Plan: ?Rectal cancer-clinical stage IV(T4aN2, M1) ?Sigmoidoscopy 04/16/2021-mass at 4 cm from the anal verge, invasive moderately differentiated adenocarcinoma with lymphovascular invasion ?OmniSeq-MSS, tumor mutation burden 3.9, PD-L1 less than 1%, K-ras G12 D ?CT angio abdomen/pelvis-bulky irregular rectal wall thickening with  perirectal lymphadenopathy and 2 indeterminate liver lesions ?MRI liver 04/17/2021-3 discrete right liver metastases ?MRI pelvis 04/17/2021-T4aN2 rectal tumor-10 cm from the anal verge, involvement of left anterior peritoneal reflection, numerous mesorectal lymph nodes within the pelvis, no evidence of extra mesorectal adenopathy greater than 10 nodes in the mesorectum ?CT chest 04/18/2021-no evidence of metastatic disease ?Elevated CEA (14.5 on 04/17/2021) ?Cycle 1 FOLFOX 04/30/2021 ?Cycle 2 FOLFOX 05/14/2021 ?Cycle 3 FOLFOX 05/28/2021, Udenyca ?Rectal bleeding and pain secondary to #1 ?Chronic left sciatica ?Bipolar disorder ?Right testicular fullness noted on exam 04/21/2021-epididymal cyst? ?Port-A-Cath placement Interventional Radiology 04/28/2021 ? ?Disposition: Mr. Jeremiah Allen appears stable.  He has completed 2 cycles of FOLFOX.  Plan to proceed with cycle 3 today as scheduled.  CBC from today reviewed.  Counts are adequate to proceed with treatment.  He has mild neutropenia.  He will receive Udenyca on the day of pump discontinuation.  We reviewed potential toxicities including bone pain, rash, splenic rupture.  He agrees to proceed.  He understands to contact the office with signs of infection. ? ?He will return for lab, follow-up, cycle 4 FOLFOX in 2 weeks.  We are available to see him sooner if needed. ? ? ? ?Jeremiah Allen ANP/GNP-BC  ? ?05/28/2021  ?9:07 AM ? ? ? ? ? ? ? ?

## 2021-05-28 NOTE — Patient Instructions (Signed)
[facility]   ?Discharge Instructions: ?Thank you for choosing [facility] to provide your oncology and hematology care.  ? ?If you have a lab appointment with the [facility], please go directly to the [facility] and check in at the registration area. ?  ?Wear comfortable clothing and clothing appropriate for easy access to any Portacath or PICC line.  ? ?We strive to give you quality time with your provider. You may need to reschedule your appointment if you arrive late (15 or more minutes).  Arriving late affects you and other patients whose appointments are after yours.  Also, if you miss three or more appointments without notifying the office, you may be dismissed from the clinic at the provider?s discretion.    ?  ?For prescription refill requests, have your pharmacy contact our office and allow 72 hours for refills to be completed.   ? ?Today you received the following chemotherapy and/or immunotherapy agents Oxaliplatin (ELOXATIN), Leucovorin & Flourouracil (ADRUCIL).    ?  ?To help prevent nausea and vomiting after your treatment, we encourage you to take your nausea medication as directed. ? ?BELOW ARE SYMPTOMS THAT SHOULD BE REPORTED IMMEDIATELY: ?*FEVER GREATER THAN 100.4 F (38 ?C) OR HIGHER ?*CHILLS OR SWEATING ?*NAUSEA AND VOMITING THAT IS NOT CONTROLLED WITH YOUR NAUSEA MEDICATION ?*UNUSUAL SHORTNESS OF BREATH ?*UNUSUAL BRUISING OR BLEEDING ?*URINARY PROBLEMS (pain or burning when urinating, or frequent urination) ?*BOWEL PROBLEMS (unusual diarrhea, constipation, pain near the anus) ?TENDERNESS IN MOUTH AND THROAT WITH OR WITHOUT PRESENCE OF ULCERS (sore throat, sores in mouth, or a toothache) ?UNUSUAL RASH, SWELLING OR PAIN  ?UNUSUAL VAGINAL DISCHARGE OR ITCHING  ? ?Items with * indicate a potential emergency and should be followed up as soon as possible or go to the Emergency Department if any problems should occur. ? ?Please show the CHEMOTHERAPY ALERT  CARD or IMMUNOTHERAPY ALERT CARD at check-in to the Emergency Department and triage nurse. ? ?Should you have questions after your visit or need to cancel or reschedule your appointment, please contact [facility]  Dept: [phone]  and follow the prompts.  Office hours are 8:00 a.m. to 4:30 p.m. Monday - Friday. Please note that voicemails left after 4:00 p.m. may not be returned until the following business day.  We are closed weekends and major holidays. You have access to a nurse at all times for urgent questions. Please call the main number to the clinic Dept: [phone] and follow the prompts. ? ? ?For any non-urgent questions, you may also contact your provider using MyChart. We now offer e-Visits for anyone 73 and older to request care online for non-urgent symptoms. For details visit [email or web address]. ?  ?Also download the MyChart app! Go to the app store, search "MyChart", open the app, select [facility], and log in with your MyChart username and password. ? ?Due to Covid, a mask is required upon entering the hospital/clinic. If you do not have a mask, one will be given to you upon arrival. For doctor visits, patients may have 1 support person aged 60 or older with them. For treatment visits, patients cannot have anyone with them due to current Covid guidelines and our immunocompromised population.  ? ?Oxaliplatin Injection ?What is this medication? ?OXALIPLATIN (ox AL i PLA tin) is a chemotherapy drug. It targets fast dividing cells, like cancer cells, and causes these cells to die. This medicine is used to treat cancers of the colon and rectum, and  many other cancers. ?This medicine may be used for other purposes; ask your health care provider or pharmacist if you have questions. ?COMMON BRAND NAME(S): Eloxatin ?What should I tell my care team before I take this medication? ?They need to know if you have any of these conditions: ?heart disease ?history of irregular  heartbeat ?liver disease ?low blood counts, like white cells, platelets, or red blood cells ?lung or breathing disease, like asthma ?take medicines that treat or prevent blood clots ?tingling of the fingers or toes, or other nerve disorder ?an unusual or allergic reaction to oxaliplatin, other chemotherapy, other medicines, foods, dyes, or preservatives ?pregnant or trying to get pregnant ?breast-feeding ?How should I use this medication? ?This drug is given as an infusion into a vein. It is administered in a hospital or clinic by a specially trained health care professional. ?Talk to your pediatrician regarding the use of this medicine in children. Special care may be needed. ?Overdosage: If you think you have taken too much of this medicine contact a poison control center or emergency room at once. ?NOTE: This medicine is only for you. Do not share this medicine with others. ?What if I miss a dose? ?It is important not to miss a dose. Call your doctor or health care professional if you are unable to keep an appointment. ?What may interact with this medication? ?Do not take this medicine with any of the following medications: ?cisapride ?dronedarone ?pimozide ?thioridazine ?This medicine may also interact with the following medications: ?aspirin and aspirin-like medicines ?certain medicines that treat or prevent blood clots like warfarin, apixaban, dabigatran, and rivaroxaban ?cisplatin ?cyclosporine ?diuretics ?medicines for infection like acyclovir, adefovir, amphotericin B, bacitracin, cidofovir, foscarnet, ganciclovir, gentamicin, pentamidine, vancomycin ?NSAIDs, medicines for pain and inflammation, like ibuprofen or naproxen ?other medicines that prolong the QT interval (an abnormal heart rhythm) ?pamidronate ?zoledronic acid ?This list may not describe all possible interactions. Give your health care provider a list of all the medicines, herbs, non-prescription drugs, or dietary supplements you use. Also tell  them if you smoke, drink alcohol, or use illegal drugs. Some items may interact with your medicine. ?What should I watch for while using this medication? ?Your condition will be monitored carefully while you are receiving this medicine. ?You may need blood work done while you are taking this medicine. ?This medicine may make you feel generally unwell. This is not uncommon as chemotherapy can affect healthy cells as well as cancer cells. Report any side effects. Continue your course of treatment even though you feel ill unless your healthcare professional tells you to stop. ?This medicine can make you more sensitive to cold. Do not drink cold drinks or use ice. Cover exposed skin before coming in contact with cold temperatures or cold objects. When out in cold weather wear warm clothing and cover your mouth and nose to warm the air that goes into your lungs. Tell your doctor if you get sensitive to the cold. ?Do not become pregnant while taking this medicine or for 9 months after stopping it. Women should inform their health care professional if they wish to become pregnant or think they might be pregnant. Men should not father a child while taking this medicine and for 6 months after stopping it. There is potential for serious side effects to an unborn child. Talk to your health care professional for more information. ?Do not breast-feed a child while taking this medicine or for 3 months after stopping it. ?This medicine has caused ovarian failure in  some women. This medicine may make it more difficult to get pregnant. Talk to your health care professional if you are concerned about your fertility. ?This medicine has caused decreased sperm counts in some men. This may make it more difficult to father a child. Talk to your health care professional if you are concerned about your fertility. ?This medicine may increase your risk of getting an infection. Call your health care professional for advice if you get a fever,  chills, or sore throat, or other symptoms of a cold or flu. Do not treat yourself. Try to avoid being around people who are sick. ?Avoid taking medicines that contain aspirin, acetaminophen, ibuprof

## 2021-05-28 NOTE — Progress Notes (Signed)
Patient presents for treatment. RN assessment completed along with the following: ? ?Labs/vitals reviewed - Yes, and within treatment parameters.   ?Weight within 10% of previous measurement - Yes ?Informed consent completed and reflects current therapy/intent - Yes, on date 04/30/2021             ?Provider progress note reviewed - Yes, today's provider note was reviewed. ?Treatment/Antibody/Supportive plan reviewed - Yes, and there are no adjustments needed for today's treatment. ?S&H and other orders reviewed - Yes, and there are no additional orders identified. ?Previous treatment date reviewed - Yes, and the appropriate amount of time has elapsed between treatments. ?Clinic Hand Off Received from - Yes from [place], RN ? ?Patient to proceed with treatment.  ? ?

## 2021-05-28 NOTE — Progress Notes (Signed)
Patient seen by Ned Card NP today ? ?Vitals are within treatment parameters. ? ?Labs reviewed by Ned Card NP and are not all within treatment parameters. ANC 1.6- OK to treat. [place] on day 3 ? ?Per physician team, patient is ready for treatment and there are NO modifications to the treatment plan.  ?

## 2021-05-28 NOTE — Progress Notes (Signed)
Referral to psychiatry per pt request for evaluation of ADHD, depression and overall coping with diagnosis and disease. He expresses wanting to be proactive in his mental health.  ?

## 2021-05-30 ENCOUNTER — Inpatient Hospital Stay: Payer: Self-pay

## 2021-05-30 ENCOUNTER — Other Ambulatory Visit: Payer: Self-pay | Admitting: Nurse Practitioner

## 2021-05-30 VITALS — BP 120/65 | HR 58 | Temp 98.4°F | Resp 20

## 2021-05-30 DIAGNOSIS — R1114 Bilious vomiting: Secondary | ICD-10-CM

## 2021-05-30 DIAGNOSIS — R1084 Generalized abdominal pain: Secondary | ICD-10-CM

## 2021-05-30 DIAGNOSIS — C20 Malignant neoplasm of rectum: Secondary | ICD-10-CM

## 2021-05-30 MED ORDER — SODIUM CHLORIDE 0.9% FLUSH
10.0000 mL | INTRAVENOUS | Status: DC | PRN
  Administered 2021-05-30: 10 mL

## 2021-05-30 MED ORDER — SODIUM CHLORIDE 0.9 % IV SOLN
10.0000 mg | Freq: Once | INTRAVENOUS | Status: AC
  Administered 2021-05-30: 10 mg via INTRAVENOUS
  Filled 2021-05-30: qty 1

## 2021-05-30 MED ORDER — PEGFILGRASTIM-CBQV 6 MG/0.6ML ~~LOC~~ SOSY
6.0000 mg | PREFILLED_SYRINGE | Freq: Once | SUBCUTANEOUS | Status: AC
  Administered 2021-05-30: 6 mg via SUBCUTANEOUS
  Filled 2021-05-30: qty 0.6

## 2021-05-30 MED ORDER — HEPARIN SOD (PORK) LOCK FLUSH 100 UNIT/ML IV SOLN
500.0000 [IU] | Freq: Once | INTRAVENOUS | Status: AC | PRN
  Administered 2021-05-30: 500 [IU]

## 2021-05-30 MED ORDER — LORAZEPAM 2 MG/ML IJ SOLN
0.5000 mg | Freq: Once | INTRAMUSCULAR | Status: AC
  Administered 2021-05-30: 0.5 mg via INTRAVENOUS
  Filled 2021-05-30: qty 1

## 2021-05-30 MED ORDER — SODIUM CHLORIDE 0.9 % IV SOLN: Freq: Once | INTRAVENOUS | Status: AC

## 2021-05-30 NOTE — Patient Instructions (Signed)
Rehydration, Adult ?Rehydration is the replacement of body fluids, salts, and minerals (electrolytes) that are lost during dehydration. Dehydration is when there is not enough water or other fluids in the body. This happens when you lose more fluids than you take in. Common causes of dehydration include: ?Not drinking enough fluids. This can occur when you are ill or doing activities that require a lot of energy, especially in hot weather. ?Conditions that cause loss of water or other fluids, such as diarrhea, vomiting, sweating, or urinating a lot. ?Other illnesses, such as fever or infection. ?Certain medicines, such as those that remove excess fluid from the body (diuretics). ?Symptoms of mild or moderate dehydration may include thirst, dry lips and mouth, and dizziness. Symptoms of severe dehydration may include increased heart rate, confusion, fainting, and not urinating. ?For severe dehydration, you may need to get fluids through an IV at the hospital. For mild or moderate dehydration, you can usually rehydrate at home by drinking certain fluids as told by your health care provider. ?What are the risks? ?Generally, rehydration is safe. However, taking in too much fluid (overhydration) can be a problem. This is rare. Overhydration can cause an electrolyte imbalance, kidney failure, or a decrease in salt (sodium) levels in the body. ?Supplies needed ?You will need an oral rehydration solution (ORS) if your health care provider tells you to use one. This is a drink to treat dehydration. It can be found in pharmacies and retail stores. ?How to rehydrate ?Fluids ?Follow instructions from your health care provider for rehydration. The kind of fluid and the amount you should drink depend on your condition. In general, you should choose drinks that you prefer. ?If told by your health care provider, drink an ORS. ?Make an ORS by following instructions on the package. ?Start by drinking small amounts, about ? cup (120  mL) every 5-10 minutes. ?Slowly increase how much you drink until you have taken the amount recommended by your health care provider. ?Drink enough clear fluids to keep your urine pale yellow. If you were told to drink an ORS, finish it first, then start slowly drinking other clear fluids. Drink fluids such as: ?Water. This includes sparkling water and flavored water. Drinking only water can lead to having too little sodium in your body (hyponatremia). Follow the advice of your health care provider. ?Water from ice chips you suck on. ?Fruit juice with water you add to it (diluted). ?Sports drinks. ?Hot or cold herbal teas. ?Broth-based soups. ?Milk or milk products. ?Food ?Follow instructions from your health care provider about what to eat while you rehydrate. Your health care provider may recommend that you slowly begin eating regular foods in small amounts. ?Eat foods that contain a healthy balance of electrolytes, such as bananas, oranges, potatoes, tomatoes, and spinach. ?Avoid foods that are greasy or contain a lot of sugar. ?In some cases, you may get nutrition through a feeding tube that is passed through your nose and into your stomach (nasogastric tube, or NG tube). This may be done if you have uncontrolled vomiting or diarrhea. ?Beverages to avoid ? ?Certain beverages may make dehydration worse. While you rehydrate, avoid drinking alcohol. ?How to tell if you are recovering from dehydration ?You may be recovering from dehydration if: ?You are urinating more often than before you started rehydrating. ?Your urine is pale yellow. ?Your energy level improves. ?You vomit less frequently. ?You have diarrhea less frequently. ?Your appetite improves or returns to normal. ?You feel less dizzy or less light-headed. ?  Your skin tone and color start to look more normal. ?Follow these instructions at home: ?Take over-the-counter and prescription medicines only as told by your health care provider. ?Do not take sodium  tablets. Doing this can lead to having too much sodium in your body (hypernatremia). ?Contact a health care provider if: ?You continue to have symptoms of mild or moderate dehydration, such as: ?Thirst. ?Dry lips. ?Slightly dry mouth. ?Dizziness. ?Dark urine or less urine than normal. ?Muscle cramps. ?You continue to vomit or have diarrhea. ?Get help right away if you: ?Have symptoms of dehydration that get worse. ?Have a fever. ?Have a severe headache. ?Have been vomiting and the following happens: ?Your vomiting gets worse or does not go away. ?Your vomit includes blood or green matter (bile). ?You cannot eat or drink without vomiting. ?Have problems with urination or bowel movements, such as: ?Diarrhea that gets worse or does not go away. ?Blood in your stool (feces). This may cause stool to look black and tarry. ?Not urinating, or urinating only a small amount of very dark urine, within 6-8 hours. ?Have trouble breathing. ?Have symptoms that get worse with treatment. ?These symptoms may represent a serious problem that is an emergency. Do not wait to see if the symptoms will go away. Get medical help right away. Call your local emergency services (911 in the [place] Do not drive yourself to the hospital. ?Summary ?Rehydration is the replacement of body fluids and minerals (electrolytes) that are lost during dehydration. ?Follow instructions from your health care provider for rehydration. The kind of fluid and amount you should drink depend on your condition. ?Slowly increase how much you drink until you have taken the amount recommended by your health care provider. ?Contact your health care provider if you continue to show signs of mild or moderate dehydration. ?This information is not intended to replace advice given to you by your health care provider. Make sure you discuss any questions you have with your health care provider. ?Document Revised: 03/22/2019 Document Reviewed: 01/30/2019 ?Elsevier Patient  Education ? [employer]. ? ?Nausea and Vomiting, Adult ?Nausea is the feeling that you have an upset stomach or that you are about to vomit. As nausea gets worse, it can lead to vomiting. Vomiting is when stomach contents forcefully come out of your mouth as a result of nausea. Vomiting can make you feel weak and cause you to become dehydrated. ?Dehydration can make you feel tired and thirsty, cause you to have a dry mouth, and decrease how often you urinate. Older adults and people with other diseases or a weak disease-fighting system (immune system) are at higher risk for dehydration. It is important to treat your nausea and vomiting as told by your health care provider. ?Follow these instructions at home: ?Watch your symptoms for any changes. Tell your health care provider about them. ?Eating and drinking ? ?  ? ?Take an oral rehydration solution (ORS). This is a drink that is sold at pharmacies and retail stores. ?Drink clear fluids slowly and in small amounts as you are able. Clear fluids include water, ice chips, low-calorie sports drinks, and fruit juice that has water added (diluted fruit juice). ?Eat bland, easy-to-digest foods in small amounts as you are able. These foods include bananas, applesauce, rice, lean meats, toast, and crackers. ?Avoid fluids that contain a lot of sugar or caffeine, such as energy drinks, sports drinks, and soda. ?Avoid alcohol. ?Avoid spicy or fatty foods. ?General instructions ?Take over-the-counter and prescription medicines only as  told by your health care provider. ?Drink enough fluid to keep your urine pale yellow. ?Wash your hands often using soap and water for at least 20 seconds. If soap and water are not available, use hand sanitizer. ?Make sure that everyone in your household washes their hands well and often. ?Rest at home while you recover. ?Watch your condition for any changes. ?Take slow and deep breaths when you feel nauseous. ?Keep all follow-up visits.  This is important. ?Contact a health care provider if: ?Your symptoms get worse. ?You have new symptoms. ?You have a fever. ?You cannot drink fluids without vomiting. ?Your nausea does not go away after 2 days.

## 2021-06-03 ENCOUNTER — Encounter: Payer: Self-pay | Admitting: Licensed Clinical Social Worker

## 2021-06-03 NOTE — Progress Notes (Signed)
[facility] CSW Progress Note ? ?Clinical Social Worker contacted caregiver by phone to follow-up on questions about psychiatry referral and Medicaid application.  CSW left voicemail with contact information and request for return call. ? ? ? ?Cadden Elizondo , LCSW ?

## 2021-06-07 ENCOUNTER — Other Ambulatory Visit: Payer: Self-pay | Admitting: Oncology

## 2021-06-11 ENCOUNTER — Inpatient Hospital Stay (HOSPITAL_BASED_OUTPATIENT_CLINIC_OR_DEPARTMENT_OTHER): Payer: Self-pay | Admitting: Nurse Practitioner

## 2021-06-11 ENCOUNTER — Other Ambulatory Visit (HOSPITAL_BASED_OUTPATIENT_CLINIC_OR_DEPARTMENT_OTHER): Payer: Self-pay

## 2021-06-11 ENCOUNTER — Inpatient Hospital Stay: Payer: Self-pay | Attending: Oncology

## 2021-06-11 ENCOUNTER — Inpatient Hospital Stay: Payer: Self-pay

## 2021-06-11 ENCOUNTER — Encounter: Payer: Self-pay | Admitting: Nurse Practitioner

## 2021-06-11 ENCOUNTER — Encounter: Payer: Self-pay | Admitting: *Deleted

## 2021-06-11 ENCOUNTER — Encounter: Payer: Self-pay | Admitting: Oncology

## 2021-06-11 VITALS — BP 119/72 | HR 71 | Temp 98.2°F | Resp 18 | Ht 69.0 in | Wt 147.2 lb

## 2021-06-11 DIAGNOSIS — Z5111 Encounter for antineoplastic chemotherapy: Secondary | ICD-10-CM | POA: Insufficient documentation

## 2021-06-11 DIAGNOSIS — C20 Malignant neoplasm of rectum: Secondary | ICD-10-CM | POA: Insufficient documentation

## 2021-06-11 DIAGNOSIS — Z5189 Encounter for other specified aftercare: Secondary | ICD-10-CM | POA: Insufficient documentation

## 2021-06-11 DIAGNOSIS — C787 Secondary malignant neoplasm of liver and intrahepatic bile duct: Secondary | ICD-10-CM | POA: Insufficient documentation

## 2021-06-11 LAB — CMP (CANCER CENTER ONLY)
ALT: 26 U/L (ref 0–44)
AST: 32 U/L (ref 15–41)
Albumin: 4.5 g/dL (ref 3.5–5.0)
Alkaline Phosphatase: 82 U/L (ref 38–126)
Anion gap: 9 (ref 5–15)
BUN: 17 mg/dL (ref 6–20)
CO2: 26 mmol/L (ref 22–32)
Calcium: 9.8 mg/dL (ref 8.9–10.3)
Chloride: 103 mmol/L (ref 98–111)
Creatinine: 1.03 mg/dL (ref 0.61–1.24)
GFR, Estimated: >60 mL/min (ref >60)
Glucose, Bld: 99 mg/dL (ref 70–99)
Potassium: 4 mmol/L (ref 3.5–5.1)
Sodium: 138 mmol/L (ref 135–145)
Total Bilirubin: 0.4 mg/dL (ref 0.3–1.2)
Total Protein: 7.1 g/dL (ref 6.5–8.1)

## 2021-06-11 LAB — CBC WITH DIFFERENTIAL (CANCER CENTER ONLY)
Abs Immature Granulocytes: 0.03 10*3/uL (ref 0.00–0.07)
Basophils Absolute: 0 10*3/uL (ref 0.0–0.1)
Basophils Relative: 1 %
Eosinophils Absolute: 0 10*3/uL (ref 0.0–0.5)
Eosinophils Relative: 1 %
HCT: 36.7 % — ABNORMAL LOW (ref 39.0–52.0)
Hemoglobin: 12.2 g/dL — ABNORMAL LOW (ref 13.0–17.0)
Immature Granulocytes: 1 %
Lymphocytes Relative: 33 %
Lymphs Abs: 1.6 10*3/uL (ref 0.7–4.0)
MCH: 30.7 pg (ref 26.0–34.0)
MCHC: 33.2 g/dL (ref 30.0–36.0)
MCV: 92.4 fL (ref 80.0–100.0)
Monocytes Absolute: 0.4 10*3/uL (ref 0.1–1.0)
Monocytes Relative: 9 %
Neutro Abs: 2.7 10*3/uL (ref 1.7–7.7)
Neutrophils Relative %: 55 %
Platelet Count: 151 10*3/uL (ref 150–400)
RBC: 3.97 MIL/uL — ABNORMAL LOW (ref 4.22–5.81)
RDW: 14.6 % (ref 11.5–15.5)
WBC Count: 4.8 10*3/uL (ref 4.0–10.5)
nRBC: 0 % (ref 0.0–0.2)

## 2021-06-11 LAB — CEA (ACCESS): CEA (CHCC): 3.74 ng/mL (ref 0.00–5.00)

## 2021-06-11 MED ORDER — MUPIROCIN 2 % EX OINT
TOPICAL_OINTMENT | CUTANEOUS | 0 refills | Status: DC
  Filled 2021-06-11: qty 22, 5d supply, fill #0

## 2021-06-11 MED ORDER — SODIUM CHLORIDE 0.9 % IV SOLN
2400.0000 mg/m2 | INTRAVENOUS | Status: DC
  Administered 2021-06-11: 4250 mg via INTRAVENOUS
  Filled 2021-06-11: qty 85

## 2021-06-11 MED ORDER — SODIUM CHLORIDE 0.9 % IV SOLN
10.0000 mg | Freq: Once | INTRAVENOUS | Status: AC
  Administered 2021-06-11: 10 mg via INTRAVENOUS
  Filled 2021-06-11: qty 1

## 2021-06-11 MED ORDER — DEXTROSE 5 % IV SOLN: Freq: Once | INTRAVENOUS | Status: AC

## 2021-06-11 MED ORDER — FLUOROURACIL CHEMO INJECTION 2.5 GM/50ML
400.0000 mg/m2 | Freq: Once | INTRAVENOUS | Status: AC
  Administered 2021-06-11: 700 mg via INTRAVENOUS
  Filled 2021-06-11: qty 14

## 2021-06-11 MED ORDER — OXALIPLATIN CHEMO INJECTION 100 MG/20ML
85.0000 mg/m2 | Freq: Once | INTRAVENOUS | Status: AC
  Administered 2021-06-11: 150 mg via INTRAVENOUS
  Filled 2021-06-11: qty 20

## 2021-06-11 MED ORDER — SULFAMETHOXAZOLE-TRIMETHOPRIM 800-160 MG PO TABS
ORAL_TABLET | ORAL | 0 refills | Status: DC
  Filled 2021-06-11: qty 14, 7d supply, fill #0

## 2021-06-11 MED ORDER — TRAMADOL HCL 50 MG PO TABS
50.0000 mg | ORAL_TABLET | Freq: Three times a day (TID) | ORAL | 0 refills | Status: DC | PRN
  Filled 2021-06-11: qty 30, 10d supply, fill #0

## 2021-06-11 MED ORDER — LEUCOVORIN CALCIUM INJECTION 350 MG
400.0000 mg/m2 | Freq: Once | INTRAVENOUS | Status: AC
  Administered 2021-06-11: 708 mg via INTRAVENOUS
  Filled 2021-06-11: qty 35.4

## 2021-06-11 MED ORDER — PALONOSETRON HCL INJECTION 0.25 MG/5ML
0.2500 mg | Freq: Once | INTRAVENOUS | Status: AC
  Administered 2021-06-11: 0.25 mg via INTRAVENOUS
  Filled 2021-06-11: qty 5

## 2021-06-11 MED ORDER — DEXAMETHASONE 4 MG PO TABS
4.0000 mg | ORAL_TABLET | Freq: Two times a day (BID) | ORAL | 0 refills | Status: AC
  Filled 2021-06-11: qty 6, 3d supply, fill #0

## 2021-06-11 NOTE — Patient Instructions (Addendum)
[facility]   ?Discharge Instructions: ?Thank you for choosing [facility] to provide your oncology and hematology care.  ? ?If you have a lab appointment with the [facility], please go directly to the [facility] and check in at the registration area. ?  ?Wear comfortable clothing and clothing appropriate for easy access to any Portacath or PICC line.  ? ?We strive to give you quality time with your provider. You may need to reschedule your appointment if you arrive late (15 or more minutes).  Arriving late affects you and other patients whose appointments are after yours.  Also, if you miss three or more appointments without notifying the office, you may be dismissed from the clinic at the provider?s discretion.    ?  ?For prescription refill requests, have your pharmacy contact our office and allow 72 hours for refills to be completed.   ? ?Today you received the following chemotherapy and/or immunotherapy agents Oxaliplatin (ELOXATIN), Leucovorin & Flourouracil (ADRUCIL).    ?  ?To help prevent nausea and vomiting after your treatment, we encourage you to take your nausea medication as directed. ? ?BELOW ARE SYMPTOMS THAT SHOULD BE REPORTED IMMEDIATELY: ?*FEVER GREATER THAN 100.4 F (38 ?C) OR HIGHER ?*CHILLS OR SWEATING ?*NAUSEA AND VOMITING THAT IS NOT CONTROLLED WITH YOUR NAUSEA MEDICATION ?*UNUSUAL SHORTNESS OF BREATH ?*UNUSUAL BRUISING OR BLEEDING ?*URINARY PROBLEMS (pain or burning when urinating, or frequent urination) ?*BOWEL PROBLEMS (unusual diarrhea, constipation, pain near the anus) ?TENDERNESS IN MOUTH AND THROAT WITH OR WITHOUT PRESENCE OF ULCERS (sore throat, sores in mouth, or a toothache) ?UNUSUAL RASH, SWELLING OR PAIN  ?UNUSUAL VAGINAL DISCHARGE OR ITCHING  ? ?Items with * indicate a potential emergency and should be followed up as soon as possible or go to the Emergency Department if any problems should occur. ? ?Please show the CHEMOTHERAPY ALERT  CARD or IMMUNOTHERAPY ALERT CARD at check-in to the Emergency Department and triage nurse. ? ?Should you have questions after your visit or need to cancel or reschedule your appointment, please contact [facility]  Dept: [phone]  and follow the prompts.  Office hours are 8:00 a.m. to 4:30 p.m. Monday - Friday. Please note that voicemails left after 4:00 p.m. may not be returned until the following business day.  We are closed weekends and major holidays. You have access to a nurse at all times for urgent questions. Please call the main number to the clinic Dept: [phone] and follow the prompts. ? ? ?For any non-urgent questions, you may also contact your provider using MyChart. We now offer e-Visits for anyone 62 and older to request care online for non-urgent symptoms. For details visit [email or web address]. ?  ?Also download the MyChart app! Go to the app store, search "MyChart", open the app, select [facility], and log in with your MyChart username and password. ? ?Due to Covid, a mask is required upon entering the hospital/clinic. If you do not have a mask, one will be given to you upon arrival. For doctor visits, patients may have 1 support person aged 59 or older with them. For treatment visits, patients cannot have anyone with them due to current Covid guidelines and our immunocompromised population.  ? ?Oxaliplatin Injection ?What is this medication? ?OXALIPLATIN (ox AL i PLA tin) is a chemotherapy drug. It targets fast dividing cells, like cancer cells, and causes these cells to die. This medicine is used to treat cancers of the colon and rectum, and  many other cancers. ?This medicine may be used for other purposes; ask your health care provider or pharmacist if you have questions. ?COMMON BRAND NAME(S): Eloxatin ?What should I tell my care team before I take this medication? ?They need to know if you have any of these conditions: ?heart disease ?history of irregular  heartbeat ?liver disease ?low blood counts, like white cells, platelets, or red blood cells ?lung or breathing disease, like asthma ?take medicines that treat or prevent blood clots ?tingling of the fingers or toes, or other nerve disorder ?an unusual or allergic reaction to oxaliplatin, other chemotherapy, other medicines, foods, dyes, or preservatives ?pregnant or trying to get pregnant ?breast-feeding ?How should I use this medication? ?This drug is given as an infusion into a vein. It is administered in a hospital or clinic by a specially trained health care professional. ?Talk to your pediatrician regarding the use of this medicine in children. Special care may be needed. ?Overdosage: If you think you have taken too much of this medicine contact a poison control center or emergency room at once. ?NOTE: This medicine is only for you. Do not share this medicine with others. ?What if I miss a dose? ?It is important not to miss a dose. Call your doctor or health care professional if you are unable to keep an appointment. ?What may interact with this medication? ?Do not take this medicine with any of the following medications: ?cisapride ?dronedarone ?pimozide ?thioridazine ?This medicine may also interact with the following medications: ?aspirin and aspirin-like medicines ?certain medicines that treat or prevent blood clots like warfarin, apixaban, dabigatran, and rivaroxaban ?cisplatin ?cyclosporine ?diuretics ?medicines for infection like acyclovir, adefovir, amphotericin B, bacitracin, cidofovir, foscarnet, ganciclovir, gentamicin, pentamidine, vancomycin ?NSAIDs, medicines for pain and inflammation, like ibuprofen or naproxen ?other medicines that prolong the QT interval (an abnormal heart rhythm) ?pamidronate ?zoledronic acid ?This list may not describe all possible interactions. Give your health care provider a list of all the medicines, herbs, non-prescription drugs, or dietary supplements you use. Also tell  them if you smoke, drink alcohol, or use illegal drugs. Some items may interact with your medicine. ?What should I watch for while using this medication? ?Your condition will be monitored carefully while you are receiving this medicine. ?You may need blood work done while you are taking this medicine. ?This medicine may make you feel generally unwell. This is not uncommon as chemotherapy can affect healthy cells as well as cancer cells. Report any side effects. Continue your course of treatment even though you feel ill unless your healthcare professional tells you to stop. ?This medicine can make you more sensitive to cold. Do not drink cold drinks or use ice. Cover exposed skin before coming in contact with cold temperatures or cold objects. When out in cold weather wear warm clothing and cover your mouth and nose to warm the air that goes into your lungs. Tell your doctor if you get sensitive to the cold. ?Do not become pregnant while taking this medicine or for 9 months after stopping it. Women should inform their health care professional if they wish to become pregnant or think they might be pregnant. Men should not father a child while taking this medicine and for 6 months after stopping it. There is potential for serious side effects to an unborn child. Talk to your health care professional for more information. ?Do not breast-feed a child while taking this medicine or for 3 months after stopping it. ?This medicine has caused ovarian failure in  some women. This medicine may make it more difficult to get pregnant. Talk to your health care professional if you are concerned about your fertility. ?This medicine has caused decreased sperm counts in some men. This may make it more difficult to father a child. Talk to your health care professional if you are concerned about your fertility. ?This medicine may increase your risk of getting an infection. Call your health care professional for advice if you get a fever,  chills, or sore throat, or other symptoms of a cold or flu. Do not treat yourself. Try to avoid being around people who are sick. ?Avoid taking medicines that contain aspirin, acetaminophen, ibuprof

## 2021-06-11 NOTE — Progress Notes (Signed)
?  [facility] ?OFFICE PROGRESS NOTE ? ? ?Diagnosis: Rectal cancer ? ?INTERVAL HISTORY:  ? ?Jeremiah Allen returns as scheduled.  He completed cycle 3 FOLFOX 05/28/2021.  He developed significant nausea on day 2.  Compazine was not effective.  He received IV fluids and dexamethasone on day 3 with significant improvement.  No mouth sores.  No diarrhea.  He notes bowels are moving less frequently.  Cold sensitivity lasted about 3 days.  No persistent neuropathy symptoms. ? ?Objective: ? ?Vital signs in last 24 hours: ? ?Blood pressure 119/72, pulse 71, temperature 98.2 ?F (36.8 ?C), temperature source Oral, resp. rate 18, height _0  (1.753 m), weight 147 lb 3.2 oz (66.8 kg), SpO2 99 %. ?  ? ?HEENT: No thrush or ulcers. ?Resp: Lungs clear bilaterally. ?Cardio: Regular rate and rhythm. ?GI: Abdomen soft and nontender.  No hepatomegaly. ?Vascular: No leg edema. ?Skin: Palms without erythema. ?Port-A-Cath without erythema. ? ? ?Lab Results: ? ?Lab Results  ?Component Value Date  ? WBC 4.8 06/11/2021  ? HGB 12.2 (L) 06/11/2021  ? HCT 36.7 (L) 06/11/2021  ? MCV 92.4 06/11/2021  ? PLT 151 06/11/2021  ? NEUTROABS 2.7 06/11/2021  ? ? ?Imaging: ? ?No results found. ? ?Medications: I have reviewed the patient's current medications. ? ?Assessment/Plan: ?Rectal cancer-clinical stage IV(T4aN2, M1) ?Sigmoidoscopy 04/16/2021-mass at 4 cm from the anal verge, invasive moderately differentiated adenocarcinoma with lymphovascular invasion ?OmniSeq-MSS, tumor mutation burden 3.9, PD-L1 less than 1%, K-ras G12 D ?CT angio abdomen/pelvis-bulky irregular rectal wall thickening with perirectal lymphadenopathy and 2 indeterminate liver lesions ?MRI liver 04/17/2021-3 discrete right liver metastases ?MRI pelvis 04/17/2021-T4aN2 rectal tumor-10 cm from the anal verge, involvement of left anterior peritoneal reflection, numerous mesorectal lymph nodes within the pelvis, no evidence of extra mesorectal adenopathy greater than 10 nodes in  the mesorectum ?CT chest 04/18/2021-no evidence of metastatic disease ?Elevated CEA (14.5 on 04/17/2021) ?Cycle 1 FOLFOX 04/30/2021 ?Cycle 2 FOLFOX 05/14/2021 ?Cycle 3 FOLFOX 05/28/2021, Udenyca ?Cycle 4 FOLFOX 06/11/2021, prophylactic dexamethasone added for delayed nausea, Udenyca ?Rectal bleeding and pain secondary to #1 ?Chronic left sciatica ?Bipolar disorder ?Right testicular fullness noted on exam 04/21/2021-epididymal cyst? ?Port-A-Cath placement Interventional Radiology 04/28/2021 ?  ? ?Disposition: Jeremiah Allen appears stable.  He has completed 3 cycles of FOLFOX.  He had significant delayed nausea after cycle 3.  He received IV dexamethasone and IV fluids on day 3 with improvement.  He will begin dexamethasone 4 mg twice daily on day 2 and continue for 3 days.  Potential side effects reviewed.  He agrees with this plan. ? ?CBC from today reviewed.  Counts adequate to proceed with treatment. ? ?He will return for lab, follow-up, cycle 5 FOLFOX in 2 weeks.  He will contact the office in the interim with any problems. ? ? ? ?Ned Card ANP/GNP-BC  ? ?06/11/2021  ?9:31 AM ? ? ? ? ? ? ? ?

## 2021-06-11 NOTE — Progress Notes (Signed)
Patient seen by Lisa Thomas NP today  Vitals are within treatment parameters.  Labs reviewed by Lisa Thomas NP and are within treatment parameters.  Per physician team, patient is ready for treatment and there are NO modifications to the treatment plan.     

## 2021-06-13 ENCOUNTER — Inpatient Hospital Stay: Payer: Self-pay

## 2021-06-13 VITALS — BP 116/74 | HR 69 | Temp 98.2°F | Resp 18

## 2021-06-13 DIAGNOSIS — C20 Malignant neoplasm of rectum: Secondary | ICD-10-CM

## 2021-06-13 MED ORDER — SODIUM CHLORIDE 0.9% FLUSH
10.0000 mL | INTRAVENOUS | Status: DC | PRN
  Administered 2021-06-13: 10 mL

## 2021-06-13 MED ORDER — PEGFILGRASTIM-CBQV 6 MG/0.6ML ~~LOC~~ SOSY
6.0000 mg | PREFILLED_SYRINGE | Freq: Once | SUBCUTANEOUS | Status: AC
  Administered 2021-06-13: 6 mg via SUBCUTANEOUS

## 2021-06-13 MED ORDER — HEPARIN SOD (PORK) LOCK FLUSH 100 UNIT/ML IV SOLN
500.0000 [IU] | Freq: Once | INTRAVENOUS | Status: AC | PRN
  Administered 2021-06-13: 500 [IU]

## 2021-06-13 NOTE — Progress Notes (Signed)
Pt c/o "bumps" on arms that look like "ant bites". RN notes 5-6 small reddened areas to both arms bilaterally. Pt denies other skin abnormalities, pain, or irritation. Pt verbalizes understanding to call [facility] if symptoms worsen. Pt verbalizes to go to nearest ED for shortness of breath, facial swelling, chest pain. ?

## 2021-06-21 ENCOUNTER — Other Ambulatory Visit: Payer: Self-pay | Admitting: Oncology

## 2021-06-23 NOTE — Progress Notes (Signed)
..  Patient is receiving Assistance Medication - Supplied Externally. Medication: Udenyca Manufacture: [employer] Approval Dates: Approved from 06/23/2021 until temporary pending Medicaid. Reason: Self Pay

## 2021-06-25 ENCOUNTER — Inpatient Hospital Stay: Payer: Self-pay

## 2021-06-25 ENCOUNTER — Inpatient Hospital Stay (HOSPITAL_BASED_OUTPATIENT_CLINIC_OR_DEPARTMENT_OTHER): Payer: Self-pay | Admitting: Oncology

## 2021-06-25 ENCOUNTER — Encounter: Payer: Self-pay | Admitting: *Deleted

## 2021-06-25 ENCOUNTER — Other Ambulatory Visit: Payer: Self-pay

## 2021-06-25 ENCOUNTER — Other Ambulatory Visit: Payer: Self-pay | Admitting: Oncology

## 2021-06-25 ENCOUNTER — Other Ambulatory Visit (HOSPITAL_BASED_OUTPATIENT_CLINIC_OR_DEPARTMENT_OTHER): Payer: Self-pay

## 2021-06-25 ENCOUNTER — Encounter: Payer: Self-pay | Admitting: Oncology

## 2021-06-25 VITALS — BP 119/64 | HR 96 | Temp 98.1°F | Resp 18 | Ht 69.0 in | Wt 155.4 lb

## 2021-06-25 VITALS — BP 112/72 | HR 70 | Resp 18

## 2021-06-25 DIAGNOSIS — C20 Malignant neoplasm of rectum: Secondary | ICD-10-CM

## 2021-06-25 LAB — CBC WITH DIFFERENTIAL (CANCER CENTER ONLY)
Abs Immature Granulocytes: 0.23 10*3/uL — ABNORMAL HIGH (ref 0.00–0.07)
Basophils Absolute: 0 10*3/uL (ref 0.0–0.1)
Basophils Relative: 1 %
Eosinophils Absolute: 0.1 10*3/uL (ref 0.0–0.5)
Eosinophils Relative: 1 %
HCT: 36 % — ABNORMAL LOW (ref 39.0–52.0)
Hemoglobin: 11.7 g/dL — ABNORMAL LOW (ref 13.0–17.0)
Immature Granulocytes: 3 %
Lymphocytes Relative: 29 %
Lymphs Abs: 2.1 10*3/uL (ref 0.7–4.0)
MCH: 30.9 pg (ref 26.0–34.0)
MCHC: 32.5 g/dL (ref 30.0–36.0)
MCV: 95 fL (ref 80.0–100.0)
Monocytes Absolute: 0.8 10*3/uL (ref 0.1–1.0)
Monocytes Relative: 11 %
Neutro Abs: 4.1 10*3/uL (ref 1.7–7.7)
Neutrophils Relative %: 55 %
Platelet Count: 178 10*3/uL (ref 150–400)
RBC: 3.79 MIL/uL — ABNORMAL LOW (ref 4.22–5.81)
RDW: 15.1 % (ref 11.5–15.5)
WBC Count: 7.3 10*3/uL (ref 4.0–10.5)
nRBC: 0 % (ref 0.0–0.2)

## 2021-06-25 LAB — CMP (CANCER CENTER ONLY)
ALT: 43 U/L (ref 0–44)
AST: 29 U/L (ref 15–41)
Albumin: 4.2 g/dL (ref 3.5–5.0)
Alkaline Phosphatase: 104 U/L (ref 38–126)
Anion gap: 9 (ref 5–15)
BUN: 9 mg/dL (ref 6–20)
CO2: 28 mmol/L (ref 22–32)
Calcium: 9.3 mg/dL (ref 8.9–10.3)
Chloride: 103 mmol/L (ref 98–111)
Creatinine: 0.92 mg/dL (ref 0.61–1.24)
GFR, Estimated: >60 mL/min (ref >60)
Glucose, Bld: 94 mg/dL (ref 70–99)
Potassium: 3.4 mmol/L — ABNORMAL LOW (ref 3.5–5.1)
Sodium: 140 mmol/L (ref 135–145)
Total Bilirubin: 0.2 mg/dL — ABNORMAL LOW (ref 0.3–1.2)
Total Protein: 6.6 g/dL (ref 6.5–8.1)

## 2021-06-25 LAB — CEA (ACCESS): CEA (CHCC): 4.47 ng/mL (ref 0.00–5.00)

## 2021-06-25 MED ORDER — LEUCOVORIN CALCIUM INJECTION 350 MG
400.0000 mg/m2 | Freq: Once | INTRAVENOUS | Status: AC
  Administered 2021-06-25: 708 mg via INTRAVENOUS
  Filled 2021-06-25: qty 35.4

## 2021-06-25 MED ORDER — DEXTROSE 5 % IV SOLN: Freq: Once | INTRAVENOUS | Status: AC

## 2021-06-25 MED ORDER — TRAMADOL HCL 50 MG PO TABS
50.0000 mg | ORAL_TABLET | Freq: Three times a day (TID) | ORAL | 0 refills | Status: DC | PRN
  Filled 2021-06-25: qty 30, 10d supply, fill #0

## 2021-06-25 MED ORDER — SODIUM CHLORIDE 0.9 % IV SOLN
2400.0000 mg/m2 | INTRAVENOUS | Status: DC
  Administered 2021-06-25: 4250 mg via INTRAVENOUS
  Filled 2021-06-25: qty 85

## 2021-06-25 MED ORDER — HYDROXYZINE HCL 10 MG PO TABS: 25.0000 mg | ORAL_TABLET | Freq: Three times a day (TID) | ORAL | 0 refills | Status: DC | PRN

## 2021-06-25 MED ORDER — OXALIPLATIN CHEMO INJECTION 100 MG/20ML
85.0000 mg/m2 | Freq: Once | INTRAVENOUS | Status: AC
  Administered 2021-06-25: 150 mg via INTRAVENOUS
  Filled 2021-06-25: qty 20

## 2021-06-25 MED ORDER — PALONOSETRON HCL INJECTION 0.25 MG/5ML
0.2500 mg | Freq: Once | INTRAVENOUS | Status: AC
  Administered 2021-06-25: 0.25 mg via INTRAVENOUS
  Filled 2021-06-25: qty 5

## 2021-06-25 MED ORDER — SODIUM CHLORIDE 0.9 % IV SOLN
10.0000 mg | Freq: Once | INTRAVENOUS | Status: AC
  Administered 2021-06-25: 10 mg via INTRAVENOUS
  Filled 2021-06-25: qty 1

## 2021-06-25 MED ORDER — DEXAMETHASONE 4 MG PO TABS
4.0000 mg | ORAL_TABLET | Freq: Two times a day (BID) | ORAL | 0 refills | Status: AC
  Filled 2021-06-25: qty 6, 3d supply, fill #0

## 2021-06-25 MED ORDER — FLUOROURACIL CHEMO INJECTION 2.5 GM/50ML
400.0000 mg/m2 | Freq: Once | INTRAVENOUS | Status: AC
  Administered 2021-06-25: 700 mg via INTRAVENOUS
  Filled 2021-06-25: qty 14

## 2021-06-25 NOTE — Progress Notes (Signed)
  [facility] OFFICE PROGRESS NOTE   Diagnosis: Rectal cancer  INTERVAL HISTORY:   Jeremiah Allen completed another cycle of FOLFOX 06/11/2021.  He had nausea and dry heaves beginning on day 3.  This lasted for several days.  Constipation has improved since beginning chemotherapy.  No bleeding.  He has cold sensitivity following chemotherapy.  This has resolved.  No other neuropathy symptoms.  He had mild leg pain following Udenyca.  Objective:  Vital signs in last 24 hours:  Blood pressure 119/64, pulse 96, temperature 98.1 F (36.7 C), temperature source Oral, resp. rate 18, height $RemoveBe'5\' 9"'YrfdEWFPd$  (1.753 m), weight 155 lb 6.4 oz (70.5 kg), SpO2 100 %.    HEENT: No thrush or ulcers Resp: Lungs clear bilaterally Cardio: Regular rate and rhythm GI: Mild diffuse tenderness, no mass, no hepatosplenomegaly Vascular: No leg edema  Skin: Mild hyperpigmentation of the fingernails  Portacath/PICC-without erythema  Lab Results:  Lab Results  Component Value Date   WBC 7.3 06/25/2021   HGB 11.7 (L) 06/25/2021   HCT 36.0 (L) 06/25/2021   MCV 95.0 06/25/2021   PLT 178 06/25/2021   NEUTROABS 4.1 06/25/2021    CMP  Lab Results  Component Value Date   NA 140 06/25/2021   K 3.4 (L) 06/25/2021   CL 103 06/25/2021   CO2 28 06/25/2021   GLUCOSE 94 06/25/2021   BUN 9 06/25/2021   CREATININE 0.92 06/25/2021   CALCIUM 9.3 06/25/2021   PROT 6.6 06/25/2021   ALBUMIN 4.2 06/25/2021   AST 29 06/25/2021   ALT 43 06/25/2021   ALKPHOS 104 06/25/2021   BILITOT 0.2 (L) 06/25/2021   GFRNONAA >60 06/25/2021    Lab Results  Component Value Date   CEA1 14.5 (H) 04/17/2021   CEA 4.47 06/25/2021    Medications: I have reviewed the patient's current medications.   Assessment/Plan: Rectal cancer-clinical stage IV(T4aN2, M1) Sigmoidoscopy 04/16/2021-mass at 4 cm from the anal verge, invasive moderately differentiated adenocarcinoma with lymphovascular invasion OmniSeq-MSS, tumor  mutation burden 3.9, PD-L1 less than 1%, K-ras G12 D CT angio abdomen/pelvis-bulky irregular rectal wall thickening with perirectal lymphadenopathy and 2 indeterminate liver lesions MRI liver 04/17/2021-3 discrete right liver metastases MRI pelvis 04/17/2021-T4aN2 rectal tumor-10 cm from the anal verge, involvement of left anterior peritoneal reflection, numerous mesorectal lymph nodes within the pelvis, no evidence of extra mesorectal adenopathy greater than 10 nodes in the mesorectum CT chest 04/18/2021-no evidence of metastatic disease Elevated CEA (14.5 on 04/17/2021) Cycle 1 FOLFOX 04/30/2021 Cycle 2 FOLFOX 05/14/2021 Cycle 3 FOLFOX 05/28/2021, Udenyca Cycle 4 FOLFOX 06/11/2021, prophylactic dexamethasone added for delayed nausea, Udenyca Cycle 5 FOLFOX 06/25/2021, Udenyca Rectal bleeding and pain secondary to #1 Chronic left sciatica Bipolar disorder Right testicular fullness noted on exam 04/21/2021-epididymal cyst? Port-A-Cath placement Interventional Radiology 04/28/2021      Disposition: [name] has completed 4 cycles of FOLFOX.  He is tolerating the chemotherapy well aside from delayed nausea.  He will complete cycle 5 today.  We will be sure he is taking home Decadron prophylaxis for nausea.  He will return for an office visit and chemotherapy in 2 weeks. I refilled his prescription for tramadol.  Betsy Coder, MD  06/25/2021  9:16 AM

## 2021-06-25 NOTE — Patient Instructions (Addendum)
[place]   The chemotherapy medication bag should finish at 46 hours, 96 hours, or 7 days. For example, if your pump is scheduled for 46 hours and it was put on at 4:00 p.m., it should finish at 2:00 p.m. the day it is scheduled to come off regardless of your appointment time.     Estimated time to finish at 11:15 Friday, May 28, 2021.   If the display on your pump reads "Low Volume" and it is beeping, take the batteries out of the pump and come to the cancer center for it to be taken off.   If the pump alarms go off prior to the pump reading "Low Volume" then call [phone] and someone can assist you.  If the plunger comes out and the chemotherapy medication is leaking out, please use your home chemo spill kit to clean up the spill. Do NOT use paper towels or other household products.  If you have problems or questions regarding your pump, please call either [phone] (24 hours a day) or the cancer center Monday-Friday 8:00 a.m.- 4:30 p.m. at the clinic number and we will assist you. If you are unable to get assistance, then go to the nearest Emergency Department and ask the staff to contact the IV team for assistance.  Discharge Instructions: Thank you for choosing [facility] to provide your oncology and hematology care.   If you have a lab appointment with the [facility], please go directly to the [facility] and check in at the registration area.   Wear comfortable clothing and clothing appropriate for easy access to any Portacath or PICC line.   We strive to give you quality time with your provider. You may need to reschedule your appointment if you arrive late (15 or more minutes).  Arriving late affects you and other patients whose appointments are after yours.  Also, if you miss three or more appointments without notifying the office, you may be dismissed from the clinic at the provider's discretion.      For prescription  refill requests, have your pharmacy contact our office and allow 72 hours for refills to be completed.    Today you received the following chemotherapy and/or immunotherapy agents Oxaliplatin, Leucovorin, Fluorouracil      To help prevent nausea and vomiting after your treatment, we encourage you to take your nausea medication as directed.  BELOW ARE SYMPTOMS THAT SHOULD BE REPORTED IMMEDIATELY: *FEVER GREATER THAN 100.4 F (38 C) OR HIGHER *CHILLS OR SWEATING *NAUSEA AND VOMITING THAT IS NOT CONTROLLED WITH YOUR NAUSEA MEDICATION *UNUSUAL SHORTNESS OF BREATH *UNUSUAL BRUISING OR BLEEDING *URINARY PROBLEMS (pain or burning when urinating, or frequent urination) *BOWEL PROBLEMS (unusual diarrhea, constipation, pain near the anus) TENDERNESS IN MOUTH AND THROAT WITH OR WITHOUT PRESENCE OF ULCERS (sore throat, sores in mouth, or a toothache) UNUSUAL RASH, SWELLING OR PAIN  UNUSUAL VAGINAL DISCHARGE OR ITCHING   Items with * indicate a potential emergency and should be followed up as soon as possible or go to the Emergency Department if any problems should occur.  Please show the CHEMOTHERAPY ALERT CARD or IMMUNOTHERAPY ALERT CARD at check-in to the Emergency Department and triage nurse.  Should you have questions after your visit or need to cancel or reschedule your appointment, please contact [facility]  Dept: [phone]  and follow the prompts.  Office hours are 8:00 a.m. to 4:30 p.m. Monday - Friday. Please note that voicemails  left after 4:00 p.m. may not be returned until the following business day.  We are closed weekends and major holidays. You have access to a nurse at all times for urgent questions. Please call the main number to the clinic Dept: [phone] and follow the prompts.   For any non-urgent questions, you may also contact your provider using MyChart. We now offer e-Visits for anyone 73 and older to request care online for non-urgent symptoms.  For details visit [email or web address].   Also download the MyChart app! Go to the app store, search "MyChart", open the app, select , and log in with your MyChart username and password.  Due to Covid, a mask is required upon entering the hospital/clinic. If you do not have a mask, one will be given to you upon arrival. For doctor visits, patients may have 1 support person aged 22 or older with them. For treatment visits, patients cannot have anyone with them due to current Covid guidelines and our immunocompromised population.   Oxaliplatin Injection What is this medication? OXALIPLATIN (ox AL i PLA tin) is a chemotherapy drug. It targets fast dividing cells, like cancer cells, and causes these cells to die. This medicine is used to treat cancers of the colon and rectum, and many other cancers. This medicine may be used for other purposes; ask your health care provider or pharmacist if you have questions. COMMON BRAND NAME(S): Eloxatin What should I tell my care team before I take this medication? They need to know if you have any of these conditions: heart disease history of irregular heartbeat liver disease low blood counts, like white cells, platelets, or red blood cells lung or breathing disease, like asthma take medicines that treat or prevent blood clots tingling of the fingers or toes, or other nerve disorder an unusual or allergic reaction to oxaliplatin, other chemotherapy, other medicines, foods, dyes, or preservatives pregnant or trying to get pregnant breast-feeding How should I use this medication? This drug is given as an infusion into a vein. It is administered in a hospital or clinic by a specially trained health care professional. Talk to your pediatrician regarding the use of this medicine in children. Special care may be needed. Overdosage: If you think you have taken too much of this medicine contact a poison control center or emergency room at once. NOTE:  This medicine is only for you. Do not share this medicine with others. What if I miss a dose? It is important not to miss a dose. Call your doctor or health care professional if you are unable to keep an appointment. What may interact with this medication? Do not take this medicine with any of the following medications: cisapride dronedarone pimozide thioridazine This medicine may also interact with the following medications: aspirin and aspirin-like medicines certain medicines that treat or prevent blood clots like warfarin, apixaban, dabigatran, and rivaroxaban cisplatin cyclosporine diuretics medicines for infection like acyclovir, adefovir, amphotericin B, bacitracin, cidofovir, foscarnet, ganciclovir, gentamicin, pentamidine, vancomycin NSAIDs, medicines for pain and inflammation, like ibuprofen or naproxen other medicines that prolong the QT interval (an abnormal heart rhythm) pamidronate zoledronic acid This list may not describe all possible interactions. Give your health care provider a list of all the medicines, herbs, non-prescription drugs, or dietary supplements you use. Also tell them if you smoke, drink alcohol, or use illegal drugs. Some items may interact with your medicine. What should I watch for while using this medication? Your condition will be monitored carefully while you are receiving this  medicine. You may need blood work done while you are taking this medicine. This medicine may make you feel generally unwell. This is not uncommon as chemotherapy can affect healthy cells as well as cancer cells. Report any side effects. Continue your course of treatment even though you feel ill unless your healthcare professional tells you to stop. This medicine can make you more sensitive to cold. Do not drink cold drinks or use ice. Cover exposed skin before coming in contact with cold temperatures or cold objects. When out in cold weather wear warm clothing and cover your mouth  and nose to warm the air that goes into your lungs. Tell your doctor if you get sensitive to the cold. Do not become pregnant while taking this medicine or for 9 months after stopping it. Women should inform their health care professional if they wish to become pregnant or think they might be pregnant. Men should not father a child while taking this medicine and for 6 months after stopping it. There is potential for serious side effects to an unborn child. Talk to your health care professional for more information. Do not breast-feed a child while taking this medicine or for 3 months after stopping it. This medicine has caused ovarian failure in some women. This medicine may make it more difficult to get pregnant. Talk to your health care professional if you are concerned about your fertility. This medicine has caused decreased sperm counts in some men. This may make it more difficult to father a child. Talk to your health care professional if you are concerned about your fertility. This medicine may increase your risk of getting an infection. Call your health care professional for advice if you get a fever, chills, or sore throat, or other symptoms of a cold or flu. Do not treat yourself. Try to avoid being around people who are sick. Avoid taking medicines that contain aspirin, acetaminophen, ibuprofen, naproxen, or ketoprofen unless instructed by your health care professional. These medicines may hide a fever. Be careful brushing or flossing your teeth or using a toothpick because you may get an infection or bleed more easily. If you have any dental work done, tell your dentist you are receiving this medicine. What side effects may I notice from receiving this medication? Side effects that you should report to your doctor or health care professional as soon as possible: allergic reactions like skin rash, itching or hives, swelling of the face, lips, or tongue breathing problems cough low blood counts  - this medicine may decrease the number of white blood cells, red blood cells, and platelets. You may be at increased risk for infections and bleeding nausea, vomiting pain, redness, or irritation at site where injected pain, tingling, numbness in the hands or feet signs and symptoms of bleeding such as bloody or black, tarry stools; red or dark brown urine; spitting up blood or brown material that looks like coffee grounds; red spots on the skin; unusual bruising or bleeding from the eyes, gums, or nose signs and symptoms of a dangerous change in heartbeat or heart rhythm like chest pain; dizziness; fast, irregular heartbeat; palpitations; feeling faint or lightheaded; falls signs and symptoms of infection like fever; chills; cough; sore throat; pain or trouble passing urine signs and symptoms of liver injury like dark yellow or brown urine; general ill feeling or flu-like symptoms; light-colored stools; loss of appetite; nausea; right upper belly pain; unusually weak or tired; yellowing of the eyes or skin signs and symptoms of  low red blood cells or anemia such as unusually weak or tired; feeling faint or lightheaded; falls signs and symptoms of muscle injury like dark urine; trouble passing urine or change in the amount of urine; unusually weak or tired; muscle pain; back pain Side effects that usually do not require medical attention (report to your doctor or health care professional if they continue or are bothersome): changes in taste diarrhea gas hair loss loss of appetite mouth sores This list may not describe all possible side effects. Call your doctor for medical advice about side effects. You may report side effects to FDA at 1-800-FDA-1088. Where should I keep my medication? This drug is given in a hospital or clinic and will not be stored at home. NOTE: This sheet is a summary. It may not cover all possible information. If you have questions about this medicine, talk to your doctor,  pharmacist, or health care provider.  2023 Elsevier/Gold Standard (2020-12-20 00:00:00)  Leucovorin injection What is this medication? LEUCOVORIN (loo koe VOR in) is used to prevent or treat the harmful effects of some medicines. This medicine is used to treat anemia caused by a low amount of folic acid in the body. It is also used with 5-fluorouracil (5-FU) to treat colon cancer. This medicine may be used for other purposes; ask your health care provider or pharmacist if you have questions. What should I tell my care team before I take this medication? They need to know if you have any of these conditions: anemia from low levels of vitamin B-12 in the blood an unusual or allergic reaction to leucovorin, folic acid, other medicines, foods, dyes, or preservatives pregnant or trying to get pregnant breast-feeding How should I use this medication? This medicine is for injection into a muscle or into a vein. It is given by a health care professional in a hospital or clinic setting. Talk to your pediatrician regarding the use of this medicine in children. Special care may be needed. Overdosage: If you think you have taken too much of this medicine contact a poison control center or emergency room at once. NOTE: This medicine is only for you. Do not share this medicine with others. What if I miss a dose? This does not apply. What may interact with this medication? capecitabine fluorouracil phenobarbital phenytoin primidone trimethoprim-sulfamethoxazole This list may not describe all possible interactions. Give your health care provider a list of all the medicines, herbs, non-prescription drugs, or dietary supplements you use. Also tell them if you smoke, drink alcohol, or use illegal drugs. Some items may interact with your medicine. What should I watch for while using this medication? Your condition will be monitored carefully while you are receiving this medicine. This medicine may  increase the side effects of 5-fluorouracil, 5-FU. Tell your doctor or health care professional if you have diarrhea or mouth sores that do not get better or that get worse. What side effects may I notice from receiving this medication? Side effects that you should report to your doctor or health care professional as soon as possible: allergic reactions like skin rash, itching or hives, swelling of the face, lips, or tongue breathing problems fever, infection mouth sores unusual bleeding or bruising unusually weak or tired Side effects that usually do not require medical attention (report to your doctor or health care professional if they continue or are bothersome): constipation or diarrhea loss of appetite nausea, vomiting This list may not describe all possible side effects. Call your doctor for medical  advice about side effects. You may report side effects to FDA at 1-800-FDA-1088. Where should I keep my medication? This drug is given in a hospital or clinic and will not be stored at home. NOTE: This sheet is a summary. It may not cover all possible information. If you have questions about this medicine, talk to your doctor, pharmacist, or health care provider.  2023 Elsevier/Gold Standard (2007-07-28 00:00:00)  Fluorouracil, 5-FU injection What is this medication? FLUOROURACIL, 5-FU (flure oh YOOR a sil) is a chemotherapy drug. It slows the growth of cancer cells. This medicine is used to treat many types of cancer like breast cancer, colon or rectal cancer, pancreatic cancer, and stomach cancer. This medicine may be used for other purposes; ask your health care provider or pharmacist if you have questions. COMMON BRAND NAME(S): Adrucil What should I tell my care team before I take this medication? They need to know if you have any of these conditions: blood disorders dihydropyrimidine dehydrogenase (DPD) deficiency infection (especially a virus infection such as chickenpox, cold  sores, or herpes) kidney disease liver disease malnourished, poor nutrition recent or ongoing radiation therapy an unusual or allergic reaction to fluorouracil, other chemotherapy, other medicines, foods, dyes, or preservatives pregnant or trying to get pregnant breast-feeding How should I use this medication? This drug is given as an infusion or injection into a vein. It is administered in a hospital or clinic by a specially trained health care professional. Talk to your pediatrician regarding the use of this medicine in children. Special care may be needed. Overdosage: If you think you have taken too much of this medicine contact a poison control center or emergency room at once. NOTE: This medicine is only for you. Do not share this medicine with others. What if I miss a dose? It is important not to miss your dose. Call your doctor or health care professional if you are unable to keep an appointment. What may interact with this medication? Do not take this medicine with any of the following medications: live virus vaccines This medicine may also interact with the following medications: medicines that treat or prevent blood clots like warfarin, enoxaparin, and dalteparin This list may not describe all possible interactions. Give your health care provider a list of all the medicines, herbs, non-prescription drugs, or dietary supplements you use. Also tell them if you smoke, drink alcohol, or use illegal drugs. Some items may interact with your medicine. What should I watch for while using this medication? Visit your doctor for checks on your progress. This drug may make you feel generally unwell. This is not uncommon, as chemotherapy can affect healthy cells as well as cancer cells. Report any side effects. Continue your course of treatment even though you feel ill unless your doctor tells you to stop. In some cases, you may be given additional medicines to help with side effects. Follow all  directions for their use. Call your doctor or health care professional for advice if you get a fever, chills or sore throat, or other symptoms of a cold or flu. Do not treat yourself. This drug decreases your body's ability to fight infections. Try to avoid being around people who are sick. This medicine may increase your risk to bruise or bleed. Call your doctor or health care professional if you notice any unusual bleeding. Be careful brushing and flossing your teeth or using a toothpick because you may get an infection or bleed more easily. If you have any dental work done,  tell your dentist you are receiving this medicine. Avoid taking products that contain aspirin, acetaminophen, ibuprofen, naproxen, or ketoprofen unless instructed by your doctor. These medicines may hide a fever. Do not become pregnant while taking this medicine. Women should inform their doctor if they wish to become pregnant or think they might be pregnant. There is a potential for serious side effects to an unborn child. Talk to your health care professional or pharmacist for more information. Do not breast-feed an infant while taking this medicine. Men should inform their doctor if they wish to father a child. This medicine may lower sperm counts. Do not treat diarrhea with over the counter products. Contact your doctor if you have diarrhea that lasts more than 2 days or if it is severe and watery. This medicine can make you more sensitive to the sun. Keep out of the sun. If you cannot avoid being in the sun, wear protective clothing and use sunscreen. Do not use sun lamps or tanning beds/booths. What side effects may I notice from receiving this medication? Side effects that you should report to your doctor or health care professional as soon as possible: allergic reactions like skin rash, itching or hives, swelling of the face, lips, or tongue low blood counts - this medicine may decrease the number of white blood cells, red  blood cells and platelets. You may be at increased risk for infections and bleeding. signs of infection - fever or chills, cough, sore throat, pain or difficulty passing urine signs of decreased platelets or bleeding - bruising, pinpoint red spots on the skin, black, tarry stools, blood in the urine signs of decreased red blood cells - unusually weak or tired, fainting spells, lightheadedness breathing problems changes in vision chest pain mouth sores nausea and vomiting pain, swelling, redness at site where injected pain, tingling, numbness in the hands or feet redness, swelling, or sores on hands or feet stomach pain unusual bleeding Side effects that usually do not require medical attention (report to your doctor or health care professional if they continue or are bothersome): changes in finger or toe nails diarrhea dry or itchy skin hair loss headache loss of appetite sensitivity of eyes to the light stomach upset unusually teary eyes This list may not describe all possible side effects. Call your doctor for medical advice about side effects. You may report side effects to FDA at 1-800-FDA-1088. Where should I keep my medication? This drug is given in a hospital or clinic and will not be stored at home. NOTE: This sheet is a summary. It may not cover all possible information. If you have questions about this medicine, talk to your doctor, pharmacist, or health care provider.  2023 Elsevier/Gold Standard (2020-12-20 00:00:00)

## 2021-06-25 NOTE — Progress Notes (Signed)
Patient seen by Dr. Benay Spice today  Vitals are within treatment parameters.  Labs reviewed by Dr. Benay Spice and are within treatment parameters.  Per physician team, patient is ready for treatment and there are NO modifications to the treatment plan. MD will add dexamethasone to take at home for nausea.

## 2021-06-27 ENCOUNTER — Inpatient Hospital Stay: Payer: Self-pay

## 2021-06-27 VITALS — BP 118/70 | HR 63 | Temp 97.9°F | Resp 18

## 2021-06-27 DIAGNOSIS — C20 Malignant neoplasm of rectum: Secondary | ICD-10-CM

## 2021-06-27 MED ORDER — PEGFILGRASTIM-CBQV 6 MG/0.6ML ~~LOC~~ SOSY
6.0000 mg | PREFILLED_SYRINGE | Freq: Once | SUBCUTANEOUS | Status: AC
  Administered 2021-06-27: 6 mg via SUBCUTANEOUS
  Filled 2021-06-27: qty 0.6

## 2021-06-27 MED ORDER — HEPARIN SOD (PORK) LOCK FLUSH 100 UNIT/ML IV SOLN
500.0000 [IU] | Freq: Once | INTRAVENOUS | Status: AC | PRN
  Administered 2021-06-27: 500 [IU]

## 2021-06-27 MED ORDER — SODIUM CHLORIDE 0.9% FLUSH
10.0000 mL | INTRAVENOUS | Status: DC | PRN
  Administered 2021-06-27: 10 mL

## 2021-06-27 NOTE — Patient Instructions (Signed)
Heparin injection ?What is this medication? ?HEPARIN (HEP a rin) is an anticoagulant. It is used to treat or prevent clots in the veins, arteries, lungs, or heart. It stops clots from forming or getting bigger. This medicine prevents clotting during open-heart surgery, dialysis, or in patients who are confined to bed. ?This medicine may be used for other purposes; ask your health care provider or pharmacist if you have questions. ?COMMON BRAND NAME(S): Hep-Lock, Hep-Lock U/P, Hepflush-10, Monoject Prefill Advanced Heparin Lock Flush, SASH Normal Saline and Heparin ?What should I tell my care team before I take this medication? ?They need to know if you have any of these conditions: ?bleeding disorders, such as hemophilia or low blood platelets ?bowel disease or diverticulitis ?endocarditis ?high blood pressure ?liver disease ?recent surgery or delivery of a baby ?stomach ulcers ?an unusual or allergic reaction to heparin, benzyl alcohol, sulfites, other medicines, foods, dyes, or preservatives ?pregnant or trying to get pregnant ?breast-feeding ?How should I use this medication? ?This medicine is given by injection or infusion into a vein. It can also be given by injection of small amounts under the skin. It is usually given by a health care professional in a hospital or clinic setting. ?If you get this medicine at home, you will be taught how to prepare and give this medicine. Use exactly as directed. Take your medicine at regular intervals. Do not take it more often than directed. Do not stop taking except on your doctor's advice. Stopping this medicine may increase your risk of a blot clot. Be sure to refill your prescription before you run out of medicine. ?It is important that you put your used needles and syringes in a special sharps container. Do not put them in a trash can. If you do not have a sharps container, call your pharmacist or healthcare provider to get one. ?Talk to your pediatrician regarding the  use of this medicine in children. While this medicine may be prescribed for children for selected conditions, precautions do apply. ?Overdosage: If you think you have taken too much of this medicine contact a poison control center or emergency room at once. ?NOTE: This medicine is only for you. Do not share this medicine with others. ?What if I miss a dose? ?If you miss a dose, take it as soon as you can. If it is almost time for your next dose, take only that dose. Do not take double or extra doses. ?What may interact with this medication? ?Do not take this medicine with any of the following medications: ?aspirin and aspirin-like drugs ?mifepristone ?medicines that treat or prevent blood clots like warfarin, enoxaparin, and dalteparin ?palifermin ?protamine ?This medicine may also interact with the following medications: ?dextran ?digoxin ?hydroxychloroquine ?medicines for treating colds or allergies ?nicotine ?NSAIDs, medicines for pain and inflammation, like ibuprofen or naproxen ?phenylbutazone ?tetracycline antibiotics ?This list may not describe all possible interactions. Give your health care provider a list of all the medicines, herbs, non-prescription drugs, or dietary supplements you use. Also tell them if you smoke, drink alcohol, or use illegal drugs. Some items may interact with your medicine. ?What should I watch for while using this medication? ?Visit your healthcare professional for regular checks on your progress. You may need blood work done while you are taking this medicine. Your condition will be monitored carefully while you are receiving this medicine. It is important not to miss any appointments. ?Wear a medical ID bracelet or chain, and carry a card that describes your disease and details   of your medicine and dosage times. ?Notify your doctor or healthcare professional at once if you have cold, blue hands or feet. ?If you are going to need surgery or other procedure, tell your healthcare  professional that you are using this medicine. ?Avoid sports and activities that might cause injury while you are using this medicine. Severe falls or injuries can cause unseen bleeding. Be careful when using sharp tools or knives. Consider using an electric razor. Take special care brushing or flossing your teeth. Report any injuries, bruising, or red spots on the skin to your healthcare professional. ?Using this medicine for a long time may weaken your bones and increase the risk of bone fractures. ?You should make sure that you get enough calcium and vitamin D while you are taking this medicine. Discuss the foods you eat and the vitamins you take with your healthcare professional. ?Wear a medical ID bracelet or chain. Carry a card that describes your disease and details of your medicine and dosage times. ?What side effects may I notice from receiving this medication? ?Side effects that you should report to your doctor or health care professional as soon as possible: ?allergic reactions like skin rash, itching or hives, swelling of the face, lips, or tongue ?bone pain ?fever, chills ?nausea, vomiting ?signs and symptoms of bleeding such as bloody or black, tarry stools; red or dark-brown urine; spitting up blood or brown material that looks like coffee grounds; red spots on the skin; unusual bruising or bleeding from the eye, gums, or nose ?signs and symptoms of a blood clot such as chest pain; shortness of breath; pain, swelling, or warmth in the leg ?signs and symptoms of a stroke such as changes in vision; confusion; trouble speaking or understanding; severe headaches; sudden numbness or weakness of the face, arm or leg; trouble walking; dizziness; loss of coordination ?Side effects that usually do not require medical attention (report to your doctor or health care professional if they continue or are bothersome): ?hair loss ?pain, redness, or irritation at site where injected ?This list may not describe all  possible side effects. Call your doctor for medical advice about side effects. You may report side effects to FDA at 1-800-FDA-1088. ?Where should I keep my medication? ?Keep out of the reach of children. ?Store unopened vials at room temperature between 15 and 30 degrees C (59 and 86 degrees F). Do not freeze. Do not use if solution is discolored or particulate matter is present. Throw away any unused medicine after the expiration date. ?NOTE: This sheet is a summary. It may not cover all possible information. If you have questions about this medicine, talk to your doctor, pharmacist, or health care provider. ?? 2023 [employer] Standard (2020-02-29 00:00:00) ? ?

## 2021-07-06 ENCOUNTER — Other Ambulatory Visit: Payer: Self-pay | Admitting: Oncology

## 2021-07-08 ENCOUNTER — Inpatient Hospital Stay: Payer: Medicaid Other | Attending: Oncology

## 2021-07-08 ENCOUNTER — Inpatient Hospital Stay (HOSPITAL_BASED_OUTPATIENT_CLINIC_OR_DEPARTMENT_OTHER): Payer: Medicaid Other | Admitting: Oncology

## 2021-07-08 ENCOUNTER — Encounter: Payer: Self-pay | Admitting: Oncology

## 2021-07-08 ENCOUNTER — Other Ambulatory Visit (HOSPITAL_BASED_OUTPATIENT_CLINIC_OR_DEPARTMENT_OTHER): Payer: Self-pay

## 2021-07-08 ENCOUNTER — Inpatient Hospital Stay: Payer: Medicaid Other

## 2021-07-08 ENCOUNTER — Encounter: Payer: Self-pay | Admitting: *Deleted

## 2021-07-08 VITALS — BP 109/71 | HR 79 | Temp 98.1°F | Resp 20 | Ht 69.0 in | Wt 157.4 lb

## 2021-07-08 DIAGNOSIS — C20 Malignant neoplasm of rectum: Secondary | ICD-10-CM

## 2021-07-08 DIAGNOSIS — Z5189 Encounter for other specified aftercare: Secondary | ICD-10-CM | POA: Insufficient documentation

## 2021-07-08 DIAGNOSIS — C787 Secondary malignant neoplasm of liver and intrahepatic bile duct: Secondary | ICD-10-CM | POA: Diagnosis present

## 2021-07-08 DIAGNOSIS — Z5111 Encounter for antineoplastic chemotherapy: Secondary | ICD-10-CM | POA: Diagnosis present

## 2021-07-08 LAB — CEA (ACCESS): CEA (CHCC): 3.99 ng/mL (ref 0.00–5.00)

## 2021-07-08 LAB — CBC WITH DIFFERENTIAL (CANCER CENTER ONLY)
Abs Immature Granulocytes: 0.13 10*3/uL — ABNORMAL HIGH (ref 0.00–0.07)
Basophils Absolute: 0 10*3/uL (ref 0.0–0.1)
Basophils Relative: 0 %
Eosinophils Absolute: 0.1 10*3/uL (ref 0.0–0.5)
Eosinophils Relative: 1 %
HCT: 37.3 % — ABNORMAL LOW (ref 39.0–52.0)
Hemoglobin: 12.3 g/dL — ABNORMAL LOW (ref 13.0–17.0)
Immature Granulocytes: 1 %
Lymphocytes Relative: 18 %
Lymphs Abs: 1.8 10*3/uL (ref 0.7–4.0)
MCH: 30.8 pg (ref 26.0–34.0)
MCHC: 33 g/dL (ref 30.0–36.0)
MCV: 93.3 fL (ref 80.0–100.0)
Monocytes Absolute: 0.8 10*3/uL (ref 0.1–1.0)
Monocytes Relative: 8 %
Neutro Abs: 6.9 10*3/uL (ref 1.7–7.7)
Neutrophils Relative %: 72 %
Platelet Count: 164 10*3/uL (ref 150–400)
RBC: 4 MIL/uL — ABNORMAL LOW (ref 4.22–5.81)
RDW: 15 % (ref 11.5–15.5)
WBC Count: 9.7 10*3/uL (ref 4.0–10.5)
nRBC: 0 % (ref 0.0–0.2)

## 2021-07-08 LAB — CMP (CANCER CENTER ONLY)
ALT: 36 U/L (ref 0–44)
AST: 21 U/L (ref 15–41)
Albumin: 4.3 g/dL (ref 3.5–5.0)
Alkaline Phosphatase: 122 U/L (ref 38–126)
Anion gap: 9 (ref 5–15)
BUN: 11 mg/dL (ref 6–20)
CO2: 26 mmol/L (ref 22–32)
Calcium: 9.7 mg/dL (ref 8.9–10.3)
Chloride: 102 mmol/L (ref 98–111)
Creatinine: 0.87 mg/dL (ref 0.61–1.24)
GFR, Estimated: >60 mL/min (ref >60)
Glucose, Bld: 88 mg/dL (ref 70–99)
Potassium: 4.1 mmol/L (ref 3.5–5.1)
Sodium: 137 mmol/L (ref 135–145)
Total Bilirubin: 0.3 mg/dL (ref 0.3–1.2)
Total Protein: 7.6 g/dL (ref 6.5–8.1)

## 2021-07-08 MED ORDER — PALONOSETRON HCL INJECTION 0.25 MG/5ML
0.2500 mg | Freq: Once | INTRAVENOUS | Status: AC
  Administered 2021-07-08: 0.25 mg via INTRAVENOUS
  Filled 2021-07-08: qty 5

## 2021-07-08 MED ORDER — FLUOROURACIL CHEMO INJECTION 2.5 GM/50ML
400.0000 mg/m2 | Freq: Once | INTRAVENOUS | Status: AC
  Administered 2021-07-08: 700 mg via INTRAVENOUS
  Filled 2021-07-08: qty 14

## 2021-07-08 MED ORDER — OXALIPLATIN CHEMO INJECTION 100 MG/20ML
85.0000 mg/m2 | Freq: Once | INTRAVENOUS | Status: AC
  Administered 2021-07-08: 150 mg via INTRAVENOUS
  Filled 2021-07-08: qty 20

## 2021-07-08 MED ORDER — TRAMADOL HCL 50 MG PO TABS
50.0000 mg | ORAL_TABLET | Freq: Three times a day (TID) | ORAL | 0 refills | Status: DC | PRN
  Filled 2021-07-08: qty 30, 10d supply, fill #0

## 2021-07-08 MED ORDER — LEUCOVORIN CALCIUM INJECTION 350 MG
400.0000 mg/m2 | Freq: Once | INTRAVENOUS | Status: AC
  Administered 2021-07-08: 708 mg via INTRAVENOUS
  Filled 2021-07-08: qty 35.4

## 2021-07-08 MED ORDER — DEXTROSE 5 % IV SOLN: Freq: Once | INTRAVENOUS | Status: AC

## 2021-07-08 MED ORDER — HYDROXYZINE HCL 25 MG PO TABS
25.0000 mg | ORAL_TABLET | Freq: Three times a day (TID) | ORAL | 0 refills | Status: DC | PRN
  Filled 2021-07-08: qty 90, 30d supply, fill #0

## 2021-07-08 MED ORDER — SODIUM CHLORIDE 0.9 % IV SOLN
2400.0000 mg/m2 | INTRAVENOUS | Status: DC
  Administered 2021-07-08: 4250 mg via INTRAVENOUS
  Filled 2021-07-08: qty 85

## 2021-07-08 MED ORDER — SODIUM CHLORIDE 0.9 % IV SOLN
10.0000 mg | Freq: Once | INTRAVENOUS | Status: AC
  Administered 2021-07-08: 10 mg via INTRAVENOUS
  Filled 2021-07-08: qty 1

## 2021-07-08 NOTE — Progress Notes (Signed)
  [facility] OFFICE PROGRESS NOTE   Diagnosis: Rectal cancer  INTERVAL HISTORY:   Mr. Jeremiah Allen returns as scheduled.  He completed another cycle of FOLFOX on 06/25/2021.  He reports cold sensitivity lasting for approximately 5 days following chemotherapy.  No neuropathy symptoms at present.  He had an upper respiratory infection beginning last week with a sore throat, congestion, and cough.  The symptoms are improving. No difficulty with bowel function.  No bleeding.  Objective:  Vital signs in last 24 hours:  Blood pressure 109/71, pulse 79, temperature 98.1 F (36.7 C), temperature source Oral, resp. rate 20, height $RemoveBe'5\' 9"'[id number]$  (1.753 m), weight 157 lb 6.4 oz (71.4 kg), SpO2 98 %.    HEENT: No thrush or ulcers Resp: Scattered bilateral inspiratory/expiratory wheeze and rhonchi, no respiratory distress Cardio: Regular rate and rhythm GI: No hepatosplenomegaly, tender in the right upper abdomen, no mass Vascular: No leg edema Neuro: Mild to moderate loss of vibratory sense at the fingertips bilaterally   Portacath/PICC-without erythema  Lab Results:  Lab Results  Component Value Date   WBC 9.7 07/08/2021   HGB 12.3 (L) 07/08/2021   HCT 37.3 (L) 07/08/2021   MCV 93.3 07/08/2021   PLT 164 07/08/2021   NEUTROABS 6.9 07/08/2021    CMP  Lab Results  Component Value Date   NA 140 06/25/2021   K 3.4 (L) 06/25/2021   CL 103 06/25/2021   CO2 28 06/25/2021   GLUCOSE 94 06/25/2021   BUN 9 06/25/2021   CREATININE 0.92 06/25/2021   CALCIUM 9.3 06/25/2021   PROT 6.6 06/25/2021   ALBUMIN 4.2 06/25/2021   AST 29 06/25/2021   ALT 43 06/25/2021   ALKPHOS 104 06/25/2021   BILITOT 0.2 (L) 06/25/2021   GFRNONAA >60 06/25/2021    Lab Results  Component Value Date   CEA1 14.5 (H) 04/17/2021   CEA 4.47 06/25/2021     Medications: I have reviewed the patient's current medications.   Assessment/Plan: Rectal cancer-clinical stage IV(T4aN2, M1) Sigmoidoscopy  04/16/2021-mass at 4 cm from the anal verge, invasive moderately differentiated adenocarcinoma with lymphovascular invasion OmniSeq-MSS, tumor mutation burden 3.9, PD-L1 less than 1%, K-ras G12 D CT angio abdomen/pelvis-bulky irregular rectal wall thickening with perirectal lymphadenopathy and 2 indeterminate liver lesions MRI liver 04/17/2021-3 discrete right liver metastases MRI pelvis 04/17/2021-T4aN2 rectal tumor-10 cm from the anal verge, involvement of left anterior peritoneal reflection, numerous mesorectal lymph nodes within the pelvis, no evidence of extra mesorectal adenopathy greater than 10 nodes in the mesorectum CT chest 04/18/2021-no evidence of metastatic disease Elevated CEA (14.5 on 04/17/2021) Cycle 1 FOLFOX 04/30/2021 Cycle 2 FOLFOX 05/14/2021 Cycle 3 FOLFOX 05/28/2021, Udenyca Cycle 4 FOLFOX 06/11/2021, prophylactic dexamethasone added for delayed nausea, Udenyca Cycle 5 FOLFOX 06/25/2021, Udenyca Cycle 6 FOLFOX 07/08/2021, Udenyca Rectal bleeding and pain secondary to #1 Chronic left sciatica Bipolar disorder Right testicular fullness noted on exam 04/21/2021-epididymal cyst? 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 Jeremiah Allen has completed 5 cycles of FOLFOX.  He has tolerated the chemotherapy well.  His clinical status has improved.  He appears to have a mild upper respiratory infection at present.  He will call for increased symptoms.  He will complete cycle 6 FOLFOX today.  I will contact Dr. Zenia Allen to coordinate restaging scans.  Mr. Jeremiah Allen will return for an office visit in 2 weeks.  Jeremiah Allen, Jeremiah Allen  07/08/2021  8:36 AM

## 2021-07-08 NOTE — Patient Instructions (Signed)
[place]   Discharge Instructions: Thank you for choosing [facility] to provide your oncology and hematology care.   If you have a lab appointment with the [facility], please go directly to the [facility] and check in at the registration area.   Wear comfortable clothing and clothing appropriate for easy access to any Portacath or PICC line.   We strive to give you quality time with your provider. You may need to reschedule your appointment if you arrive late (15 or more minutes).  Arriving late affects you and other patients whose appointments are after yours.  Also, if you miss three or more appointments without notifying the office, you may be dismissed from the clinic at the provider's discretion.      For prescription refill requests, have your pharmacy contact our office and allow 72 hours for refills to be completed.    Today you received the following chemotherapy and/or immunotherapy agents Oxaliplatin (ELOXATIN), Leucovorin & Flourouracil (ADRUCIL).      To help prevent nausea and vomiting after your treatment, we encourage you to take your nausea medication as directed.  BELOW ARE SYMPTOMS THAT SHOULD BE REPORTED IMMEDIATELY: *FEVER GREATER THAN 100.4 F (38 C) OR HIGHER *CHILLS OR SWEATING *NAUSEA AND VOMITING THAT IS NOT CONTROLLED WITH YOUR NAUSEA MEDICATION *UNUSUAL SHORTNESS OF BREATH *UNUSUAL BRUISING OR BLEEDING *URINARY PROBLEMS (pain or burning when urinating, or frequent urination) *BOWEL PROBLEMS (unusual diarrhea, constipation, pain near the anus) TENDERNESS IN MOUTH AND THROAT WITH OR WITHOUT PRESENCE OF ULCERS (sore throat, sores in mouth, or a toothache) UNUSUAL RASH, SWELLING OR PAIN  UNUSUAL VAGINAL DISCHARGE OR ITCHING   Items with * indicate a potential emergency and should be followed up as soon as possible or go to the Emergency Department if any problems should occur.  Please show the CHEMOTHERAPY ALERT  CARD or IMMUNOTHERAPY ALERT CARD at check-in to the Emergency Department and triage nurse.  Should you have questions after your visit or need to cancel or reschedule your appointment, please contact [facility]  Dept: [phone]  and follow the prompts.  Office hours are 8:00 a.m. to 4:30 p.m. Monday - Friday. Please note that voicemails left after 4:00 p.m. may not be returned until the following business day.  We are closed weekends and major holidays. You have access to a nurse at all times for urgent questions. Please call the main number to the clinic Dept: [phone] and follow the prompts.   For any non-urgent questions, you may also contact your provider using MyChart. We now offer e-Visits for anyone 34 and older to request care online for non-urgent symptoms. For details visit [email or web address].   Also download the MyChart app! Go to the app store, search "MyChart", open the app, select [facility], and log in with your MyChart username and password.  Due to Covid, a mask is required upon entering the hospital/clinic. If you do not have a mask, one will be given to you upon arrival. For doctor visits, patients may have 1 support person aged 67 or older with them. For treatment visits, patients cannot have anyone with them due to current Covid guidelines and our immunocompromised population.   Oxaliplatin Injection What is this medication? OXALIPLATIN (ox AL i PLA tin) is a chemotherapy drug. It targets fast dividing cells, like cancer cells, and causes these cells to die. This medicine is used to treat cancers of the colon and rectum, and  many other cancers. This medicine may be used for other purposes; ask your health care provider or pharmacist if you have questions. COMMON BRAND NAME(S): Eloxatin What should I tell my care team before I take this medication? They need to know if you have any of these conditions: heart disease history of irregular  heartbeat liver disease low blood counts, like white cells, platelets, or red blood cells lung or breathing disease, like asthma take medicines that treat or prevent blood clots tingling of the fingers or toes, or other nerve disorder an unusual or allergic reaction to oxaliplatin, other chemotherapy, other medicines, foods, dyes, or preservatives pregnant or trying to get pregnant breast-feeding How should I use this medication? This drug is given as an infusion into a vein. It is administered in a hospital or clinic by a specially trained health care professional. Talk to your pediatrician regarding the use of this medicine in children. Special care may be needed. Overdosage: If you think you have taken too much of this medicine contact a poison control center or emergency room at once. NOTE: This medicine is only for you. Do not share this medicine with others. What if I miss a dose? It is important not to miss a dose. Call your doctor or health care professional if you are unable to keep an appointment. What may interact with this medication? Do not take this medicine with any of the following medications: cisapride dronedarone pimozide thioridazine This medicine may also interact with the following medications: aspirin and aspirin-like medicines certain medicines that treat or prevent blood clots like warfarin, apixaban, dabigatran, and rivaroxaban cisplatin cyclosporine diuretics medicines for infection like acyclovir, adefovir, amphotericin B, bacitracin, cidofovir, foscarnet, ganciclovir, gentamicin, pentamidine, vancomycin NSAIDs, medicines for pain and inflammation, like ibuprofen or naproxen other medicines that prolong the QT interval (an abnormal heart rhythm) pamidronate zoledronic acid This list may not describe all possible interactions. Give your health care provider a list of all the medicines, herbs, non-prescription drugs, or dietary supplements you use. Also tell  them if you smoke, drink alcohol, or use illegal drugs. Some items may interact with your medicine. What should I watch for while using this medication? Your condition will be monitored carefully while you are receiving this medicine. You may need blood work done while you are taking this medicine. This medicine may make you feel generally unwell. This is not uncommon as chemotherapy can affect healthy cells as well as cancer cells. Report any side effects. Continue your course of treatment even though you feel ill unless your healthcare professional tells you to stop. This medicine can make you more sensitive to cold. Do not drink cold drinks or use ice. Cover exposed skin before coming in contact with cold temperatures or cold objects. When out in cold weather wear warm clothing and cover your mouth and nose to warm the air that goes into your lungs. Tell your doctor if you get sensitive to the cold. Do not become pregnant while taking this medicine or for 9 months after stopping it. Women should inform their health care professional if they wish to become pregnant or think they might be pregnant. Men should not father a child while taking this medicine and for 6 months after stopping it. There is potential for serious side effects to an unborn child. Talk to your health care professional for more information. Do not breast-feed a child while taking this medicine or for 3 months after stopping it. This medicine has caused ovarian failure in  some women. This medicine may make it more difficult to get pregnant. Talk to your health care professional if you are concerned about your fertility. This medicine has caused decreased sperm counts in some men. This may make it more difficult to father a child. Talk to your health care professional if you are concerned about your fertility. This medicine may increase your risk of getting an infection. Call your health care professional for advice if you get a fever,  chills, or sore throat, or other symptoms of a cold or flu. Do not treat yourself. Try to avoid being around people who are sick. Avoid taking medicines that contain aspirin, acetaminophen, ibuprofen, naproxen, or ketoprofen unless instructed by your health care professional. These medicines may hide a fever. Be careful brushing or flossing your teeth or using a toothpick because you may get an infection or bleed more easily. If you have any dental work done, tell your dentist you are receiving this medicine. What side effects may I notice from receiving this medication? Side effects that you should report to your doctor or health care professional as soon as possible: allergic reactions like skin rash, itching or hives, swelling of the face, lips, or tongue breathing problems cough low blood counts - this medicine may decrease the number of white blood cells, red blood cells, and platelets. You may be at increased risk for infections and bleeding nausea, vomiting pain, redness, or irritation at site where injected pain, tingling, numbness in the hands or feet signs and symptoms of bleeding such as bloody or black, tarry stools; red or dark brown urine; spitting up blood or brown material that looks like coffee grounds; red spots on the skin; unusual bruising or bleeding from the eyes, gums, or nose signs and symptoms of a dangerous change in heartbeat or heart rhythm like chest pain; dizziness; fast, irregular heartbeat; palpitations; feeling faint or lightheaded; falls signs and symptoms of infection like fever; chills; cough; sore throat; pain or trouble passing urine signs and symptoms of liver injury like dark yellow or brown urine; general ill feeling or flu-like symptoms; light-colored stools; loss of appetite; nausea; right upper belly pain; unusually weak or tired; yellowing of the eyes or skin signs and symptoms of low red blood cells or anemia such as unusually weak or tired; feeling faint  or lightheaded; falls signs and symptoms of muscle injury like dark urine; trouble passing urine or change in the amount of urine; unusually weak or tired; muscle pain; back pain Side effects that usually do not require medical attention (report to your doctor or health care professional if they continue or are bothersome): changes in taste diarrhea gas hair loss loss of appetite mouth sores This list may not describe all possible side effects. Call your doctor for medical advice about side effects. You may report side effects to FDA at 1-800-FDA-1088. Where should I keep my medication? This drug is given in a hospital or clinic and will not be stored at home. NOTE: This sheet is a summary. It may not cover all possible information. If you have questions about this medicine, talk to your doctor, pharmacist, or health care provider.  2023 Elsevier/Gold Standard (2020-12-20 00:00:00)  Leucovorin injection What is this medication? LEUCOVORIN (loo koe VOR in) is used to prevent or treat the harmful effects of some medicines. This medicine is used to treat anemia caused by a low amount of folic acid in the body. It is also used with 5-fluorouracil (5-FU) to treat colon  cancer. This medicine may be used for other purposes; ask your health care provider or pharmacist if you have questions. What should I tell my care team before I take this medication? They need to know if you have any of these conditions: anemia from low levels of vitamin B-12 in the blood an unusual or allergic reaction to leucovorin, folic acid, other medicines, foods, dyes, or preservatives pregnant or trying to get pregnant breast-feeding How should I use this medication? This medicine is for injection into a muscle or into a vein. It is given by a health care professional in a hospital or clinic setting. Talk to your pediatrician regarding the use of this medicine in children. Special care may be needed. Overdosage: If you  think you have taken too much of this medicine contact a poison control center or emergency room at once. NOTE: This medicine is only for you. Do not share this medicine with others. What if I miss a dose? This does not apply. What may interact with this medication? capecitabine fluorouracil phenobarbital phenytoin primidone trimethoprim-sulfamethoxazole This list may not describe all possible interactions. Give your health care provider a list of all the medicines, herbs, non-prescription drugs, or dietary supplements you use. Also tell them if you smoke, drink alcohol, or use illegal drugs. Some items may interact with your medicine. What should I watch for while using this medication? Your condition will be monitored carefully while you are receiving this medicine. This medicine may increase the side effects of 5-fluorouracil, 5-FU. Tell your doctor or health care professional if you have diarrhea or mouth sores that do not get better or that get worse. What side effects may I notice from receiving this medication? Side effects that you should report to your doctor or health care professional as soon as possible: allergic reactions like skin rash, itching or hives, swelling of the face, lips, or tongue breathing problems fever, infection mouth sores unusual bleeding or bruising unusually weak or tired Side effects that usually do not require medical attention (report to your doctor or health care professional if they continue or are bothersome): constipation or diarrhea loss of appetite nausea, vomiting This list may not describe all possible side effects. Call your doctor for medical advice about side effects. You may report side effects to FDA at 1-800-FDA-1088. Where should I keep my medication? This drug is given in a hospital or clinic and will not be stored at home. NOTE: This sheet is a summary. It may not cover all possible information. If you have questions about this  medicine, talk to your doctor, pharmacist, or health care provider.  2023 Elsevier/Gold Standard (2007-07-28 00:00:00)  Fluorouracil, 5-FU injection What is this medication? FLUOROURACIL, 5-FU (flure oh YOOR a sil) is a chemotherapy drug. It slows the growth of cancer cells. This medicine is used to treat many types of cancer like breast cancer, colon or rectal cancer, pancreatic cancer, and stomach cancer. This medicine may be used for other purposes; ask your health care provider or pharmacist if you have questions. COMMON BRAND NAME(S): Adrucil What should I tell my care team before I take this medication? They need to know if you have any of these conditions: blood disorders dihydropyrimidine dehydrogenase (DPD) deficiency infection (especially a virus infection such as chickenpox, cold sores, or herpes) kidney disease liver disease malnourished, poor nutrition recent or ongoing radiation therapy an unusual or allergic reaction to fluorouracil, other chemotherapy, other medicines, foods, dyes, or preservatives pregnant or trying to get pregnant  breast-feeding How should I use this medication? This drug is given as an infusion or injection into a vein. It is administered in a hospital or clinic by a specially trained health care professional. Talk to your pediatrician regarding the use of this medicine in children. Special care may be needed. Overdosage: If you think you have taken too much of this medicine contact a poison control center or emergency room at once. NOTE: This medicine is only for you. Do not share this medicine with others. What if I miss a dose? It is important not to miss your dose. Call your doctor or health care professional if you are unable to keep an appointment. What may interact with this medication? Do not take this medicine with any of the following medications: live virus vaccines This medicine may also interact with the following medications: medicines  that treat or prevent blood clots like warfarin, enoxaparin, and dalteparin This list may not describe all possible interactions. Give your health care provider a list of all the medicines, herbs, non-prescription drugs, or dietary supplements you use. Also tell them if you smoke, drink alcohol, or use illegal drugs. Some items may interact with your medicine. What should I watch for while using this medication? Visit your doctor for checks on your progress. This drug may make you feel generally unwell. This is not uncommon, as chemotherapy can affect healthy cells as well as cancer cells. Report any side effects. Continue your course of treatment even though you feel ill unless your doctor tells you to stop. In some cases, you may be given additional medicines to help with side effects. Follow all directions for their use. Call your doctor or health care professional for advice if you get a fever, chills or sore throat, or other symptoms of a cold or flu. Do not treat yourself. This drug decreases your body's ability to fight infections. Try to avoid being around people who are sick. This medicine may increase your risk to bruise or bleed. Call your doctor or health care professional if you notice any unusual bleeding. Be careful brushing and flossing your teeth or using a toothpick because you may get an infection or bleed more easily. If you have any dental work done, tell your dentist you are receiving this medicine. Avoid taking products that contain aspirin, acetaminophen, ibuprofen, naproxen, or ketoprofen unless instructed by your doctor. These medicines may hide a fever. Do not become pregnant while taking this medicine. Women should inform their doctor if they wish to become pregnant or think they might be pregnant. There is a potential for serious side effects to an unborn child. Talk to your health care professional or pharmacist for more information. Do not breast-feed an infant while taking  this medicine. Men should inform their doctor if they wish to father a child. This medicine may lower sperm counts. Do not treat diarrhea with over the counter products. Contact your doctor if you have diarrhea that lasts more than 2 days or if it is severe and watery. This medicine can make you more sensitive to the sun. Keep out of the sun. If you cannot avoid being in the sun, wear protective clothing and use sunscreen. Do not use sun lamps or tanning beds/booths. What side effects may I notice from receiving this medication? Side effects that you should report to your doctor or health care professional as soon as possible: allergic reactions like skin rash, itching or hives, swelling of the face, lips, or tongue low blood  counts - this medicine may decrease the number of white blood cells, red blood cells and platelets. You may be at increased risk for infections and bleeding. signs of infection - fever or chills, cough, sore throat, pain or difficulty passing urine signs of decreased platelets or bleeding - bruising, pinpoint red spots on the skin, black, tarry stools, blood in the urine signs of decreased red blood cells - unusually weak or tired, fainting spells, lightheadedness breathing problems changes in vision chest pain mouth sores nausea and vomiting pain, swelling, redness at site where injected pain, tingling, numbness in the hands or feet redness, swelling, or sores on hands or feet stomach pain unusual bleeding Side effects that usually do not require medical attention (report to your doctor or health care professional if they continue or are bothersome): changes in finger or toe nails diarrhea dry or itchy skin hair loss headache loss of appetite sensitivity of eyes to the light stomach upset unusually teary eyes This list may not describe all possible side effects. Call your doctor for medical advice about side effects. You may report side effects to FDA at  1-800-FDA-1088. Where should I keep my medication? This drug is given in a hospital or clinic and will not be stored at home. NOTE: This sheet is a summary. It may not cover all possible information. If you have questions about this medicine, talk to your doctor, pharmacist, or health care provider.  2023 Elsevier/Gold Standard (2020-12-20 00:00:00)  The chemotherapy medication bag should finish at 46 hours, 96 hours, or 7 days. For example, if your pump is scheduled for 46 hours and it was put on at 4:00 p.m., it should finish at 2:00 p.m. the day it is scheduled to come off regardless of your appointment time.     Estimated time to finish at 11:30 a.m. on Thursday 07/10/2021.   If the display on your pump reads "Low Volume" and it is beeping, take the batteries out of the pump and come to the cancer center for it to be taken off.   If the pump alarms go off prior to the pump reading "Low Volume" then call [phone] and someone can assist you.  If the plunger comes out and the chemotherapy medication is leaking out, please use your home chemo spill kit to clean up the spill. Do NOT use paper towels or other household products.  If you have problems or questions regarding your pump, please call either [phone] (24 hours a day) or the cancer center Monday-Friday 8:00 a.m.- 4:30 p.m. at the clinic number and we will assist you. If you are unable to get assistance, then go to the nearest Emergency Department and ask the staff to contact the IV team for assistance.

## 2021-07-08 NOTE — Progress Notes (Signed)
Patient seen by Dr. Sherrill today ? ?Vitals are within treatment parameters. ? ?Labs reviewed by Dr. Sherrill and are within treatment parameters. ? ?Per physician team, patient is ready for treatment and there are NO modifications to the treatment plan.  ?

## 2021-07-09 ENCOUNTER — Other Ambulatory Visit (HOSPITAL_BASED_OUTPATIENT_CLINIC_OR_DEPARTMENT_OTHER): Payer: Self-pay

## 2021-07-09 ENCOUNTER — Other Ambulatory Visit: Payer: Self-pay | Admitting: *Deleted

## 2021-07-09 DIAGNOSIS — C20 Malignant neoplasm of rectum: Secondary | ICD-10-CM

## 2021-07-09 NOTE — Progress Notes (Signed)
Dr. Benay Spice requested to schedule CT C/A/P prior to OV on 6/21 at request of Dr. Zenia Resides. Left message for patient to call office to provide any date that will NOT work for him and if he wishes to use port for IV contrast.

## 2021-07-10 ENCOUNTER — Inpatient Hospital Stay: Payer: Medicaid Other

## 2021-07-10 VITALS — BP 111/73 | HR 63 | Temp 98.1°F | Resp 20

## 2021-07-10 DIAGNOSIS — Z5111 Encounter for antineoplastic chemotherapy: Secondary | ICD-10-CM | POA: Diagnosis not present

## 2021-07-10 DIAGNOSIS — C20 Malignant neoplasm of rectum: Secondary | ICD-10-CM

## 2021-07-10 MED ORDER — PEGFILGRASTIM-CBQV 6 MG/0.6ML ~~LOC~~ SOSY
6.0000 mg | PREFILLED_SYRINGE | Freq: Once | SUBCUTANEOUS | Status: AC
  Administered 2021-07-10: 6 mg via SUBCUTANEOUS
  Filled 2021-07-10: qty 0.6

## 2021-07-10 MED ORDER — HEPARIN SOD (PORK) LOCK FLUSH 100 UNIT/ML IV SOLN
500.0000 [IU] | Freq: Once | INTRAVENOUS | Status: AC | PRN
  Administered 2021-07-10: 500 [IU]

## 2021-07-10 MED ORDER — SODIUM CHLORIDE 0.9% FLUSH
10.0000 mL | INTRAVENOUS | Status: DC | PRN
  Administered 2021-07-10: 10 mL

## 2021-07-10 NOTE — Patient Instructions (Signed)
Heparin injection ?What is this medication? ?HEPARIN (HEP a rin) is an anticoagulant. It is used to treat or prevent clots in the veins, arteries, lungs, or heart. It stops clots from forming or getting bigger. This medicine prevents clotting during open-heart surgery, dialysis, or in patients who are confined to bed. ?This medicine may be used for other purposes; ask your health care provider or pharmacist if you have questions. ?COMMON BRAND NAME(S): Hep-Lock, Hep-Lock U/P, Hepflush-10, Monoject Prefill Advanced Heparin Lock Flush, SASH Normal Saline and Heparin ?What should I tell my care team before I take this medication? ?They need to know if you have any of these conditions: ?bleeding disorders, such as hemophilia or low blood platelets ?bowel disease or diverticulitis ?endocarditis ?high blood pressure ?liver disease ?recent surgery or delivery of a baby ?stomach ulcers ?an unusual or allergic reaction to heparin, benzyl alcohol, sulfites, other medicines, foods, dyes, or preservatives ?pregnant or trying to get pregnant ?breast-feeding ?How should I use this medication? ?This medicine is given by injection or infusion into a vein. It can also be given by injection of small amounts under the skin. It is usually given by a health care professional in a hospital or clinic setting. ?If you get this medicine at home, you will be taught how to prepare and give this medicine. Use exactly as directed. Take your medicine at regular intervals. Do not take it more often than directed. Do not stop taking except on your doctor's advice. Stopping this medicine may increase your risk of a blot clot. Be sure to refill your prescription before you run out of medicine. ?It is important that you put your used needles and syringes in a special sharps container. Do not put them in a trash can. If you do not have a sharps container, call your pharmacist or healthcare provider to get one. ?Talk to your pediatrician regarding the  use of this medicine in children. While this medicine may be prescribed for children for selected conditions, precautions do apply. ?Overdosage: If you think you have taken too much of this medicine contact a poison control center or emergency room at once. ?NOTE: This medicine is only for you. Do not share this medicine with others. ?What if I miss a dose? ?If you miss a dose, take it as soon as you can. If it is almost time for your next dose, take only that dose. Do not take double or extra doses. ?What may interact with this medication? ?Do not take this medicine with any of the following medications: ?aspirin and aspirin-like drugs ?mifepristone ?medicines that treat or prevent blood clots like warfarin, enoxaparin, and dalteparin ?palifermin ?protamine ?This medicine may also interact with the following medications: ?dextran ?digoxin ?hydroxychloroquine ?medicines for treating colds or allergies ?nicotine ?NSAIDs, medicines for pain and inflammation, like ibuprofen or naproxen ?phenylbutazone ?tetracycline antibiotics ?This list may not describe all possible interactions. Give your health care provider a list of all the medicines, herbs, non-prescription drugs, or dietary supplements you use. Also tell them if you smoke, drink alcohol, or use illegal drugs. Some items may interact with your medicine. ?What should I watch for while using this medication? ?Visit your healthcare professional for regular checks on your progress. You may need blood work done while you are taking this medicine. Your condition will be monitored carefully while you are receiving this medicine. It is important not to miss any appointments. ?Wear a medical ID bracelet or chain, and carry a card that describes your disease and details   of your medicine and dosage times. ?Notify your doctor or healthcare professional at once if you have cold, blue hands or feet. ?If you are going to need surgery or other procedure, tell your healthcare  professional that you are using this medicine. ?Avoid sports and activities that might cause injury while you are using this medicine. Severe falls or injuries can cause unseen bleeding. Be careful when using sharp tools or knives. Consider using an electric razor. Take special care brushing or flossing your teeth. Report any injuries, bruising, or red spots on the skin to your healthcare professional. ?Using this medicine for a long time may weaken your bones and increase the risk of bone fractures. ?You should make sure that you get enough calcium and vitamin D while you are taking this medicine. Discuss the foods you eat and the vitamins you take with your healthcare professional. ?Wear a medical ID bracelet or chain. Carry a card that describes your disease and details of your medicine and dosage times. ?What side effects may I notice from receiving this medication? ?Side effects that you should report to your doctor or health care professional as soon as possible: ?allergic reactions like skin rash, itching or hives, swelling of the face, lips, or tongue ?bone pain ?fever, chills ?nausea, vomiting ?signs and symptoms of bleeding such as bloody or black, tarry stools; red or dark-brown urine; spitting up blood or brown material that looks like coffee grounds; red spots on the skin; unusual bruising or bleeding from the eye, gums, or nose ?signs and symptoms of a blood clot such as chest pain; shortness of breath; pain, swelling, or warmth in the leg ?signs and symptoms of a stroke such as changes in vision; confusion; trouble speaking or understanding; severe headaches; sudden numbness or weakness of the face, arm or leg; trouble walking; dizziness; loss of coordination ?Side effects that usually do not require medical attention (report to your doctor or health care professional if they continue or are bothersome): ?hair loss ?pain, redness, or irritation at site where injected ?This list may not describe all  possible side effects. Call your doctor for medical advice about side effects. You may report side effects to FDA at 1-800-FDA-1088. ?Where should I keep my medication? ?Keep out of the reach of children. ?Store unopened vials at room temperature between 15 and 30 degrees C (59 and 86 degrees F). Do not freeze. Do not use if solution is discolored or particulate matter is present. Throw away any unused medicine after the expiration date. ?NOTE: This sheet is a summary. It may not cover all possible information. If you have questions about this medicine, talk to your doctor, pharmacist, or health care provider. ?? 2023 [employer] Standard (2020-02-29 00:00:00) ? ?

## 2021-07-17 ENCOUNTER — Inpatient Hospital Stay: Payer: Medicaid Other

## 2021-07-17 ENCOUNTER — Encounter (HOSPITAL_BASED_OUTPATIENT_CLINIC_OR_DEPARTMENT_OTHER): Payer: Self-pay

## 2021-07-17 ENCOUNTER — Ambulatory Visit (HOSPITAL_BASED_OUTPATIENT_CLINIC_OR_DEPARTMENT_OTHER)
Admission: RE | Admit: 2021-07-17 | Discharge: 2021-07-17 | Disposition: A | Discharge status: Home / Self Care | Payer: Medicaid Other | Source: Ambulatory Visit | Attending: Oncology | Admitting: Oncology

## 2021-07-17 DIAGNOSIS — C20 Malignant neoplasm of rectum: Secondary | ICD-10-CM | POA: Diagnosis present

## 2021-07-17 MED ORDER — HEPARIN SOD (PORK) LOCK FLUSH 100 UNIT/ML IV SOLN
500.0000 [IU] | Freq: Once | INTRAVENOUS | Status: AC
  Administered 2021-07-17: 500 [IU] via INTRAVENOUS

## 2021-07-17 MED ORDER — IOHEXOL 300 MG/ML  SOLN
100.0000 mL | Freq: Once | INTRAMUSCULAR | Status: AC | PRN
  Administered 2021-07-17: 85 mL via INTRAVENOUS

## 2021-07-20 ENCOUNTER — Other Ambulatory Visit: Payer: Self-pay | Admitting: Oncology

## 2021-07-21 DIAGNOSIS — C20 Malignant neoplasm of rectum: Secondary | ICD-10-CM | POA: Diagnosis not present

## 2021-07-21 DIAGNOSIS — C787 Secondary malignant neoplasm of liver and intrahepatic bile duct: Secondary | ICD-10-CM | POA: Diagnosis not present

## 2021-07-23 ENCOUNTER — Inpatient Hospital Stay: Payer: Medicaid Other

## 2021-07-23 ENCOUNTER — Inpatient Hospital Stay: Payer: Medicaid Other | Admitting: Nurse Practitioner

## 2021-07-25 ENCOUNTER — Inpatient Hospital Stay: Payer: Medicaid Other

## 2021-07-29 ENCOUNTER — Ambulatory Visit: Payer: Self-pay | Admitting: Surgery

## 2021-07-29 ENCOUNTER — Other Ambulatory Visit: Payer: Self-pay

## 2021-07-29 DIAGNOSIS — C787 Secondary malignant neoplasm of liver and intrahepatic bile duct: Secondary | ICD-10-CM

## 2021-07-29 DIAGNOSIS — C20 Malignant neoplasm of rectum: Secondary | ICD-10-CM

## 2021-07-31 ENCOUNTER — Ambulatory Visit: Payer: Self-pay | Admitting: Gastroenterology

## 2021-08-01 ENCOUNTER — Encounter: Payer: Self-pay | Admitting: *Deleted

## 2021-08-01 ENCOUNTER — Encounter (HOSPITAL_COMMUNITY): Payer: Self-pay | Admitting: Surgery

## 2021-08-01 ENCOUNTER — Other Ambulatory Visit: Payer: Self-pay

## 2021-08-01 NOTE — Pre-Procedure Instructions (Signed)
    Treyvone Chelf  08/01/2021      Remember:  Do not eat or after midnight, Sunday.  You may drink clear liquids until 1030 .   Clear liquids allowed are: Water, Juice (non-citric and without pulp - diabetics please choose diet or no sugar options), Carbonated beverages - (diabetics please choose diet or no sugar options), Clear Tea, Black Coffee only (no creamer, milk or cream including half and half), Plain Jell-O only (diabetics please choose diet or no sugar options), Gatorade (diabetics please choose diet or no sugar options), and Plain Popsicles only  Drink 2 bottles of Pre -Surgery Ensure  Sunday evening. Drink 1 bottle of Pre- Surgery Ensure Monday  at  4:00 am.  Any questions call [phone] Friday - Monday am.

## 2021-08-01 NOTE — Progress Notes (Signed)
Jeremiah Allen denies chest pain or shortness of breath. Patient denies having any s/s of Covid in his household.  Patient denies any known exposure to Covid. I spoke with Dr. Zenia Resides regarding when to drink Pre- Surgery Ensure- the orders say to drink 2 bottles Friday evening and 1 bottle at 0400  Saturday. Dr. Zenia Resides stated that it should be 2 bottles on Sunday evening and 1 at 0400 Monday morning.  I spoke with Mr. Duval, he will pick the beverages today after 4:30 PM. I instructed patient verbally and typed instructio n and place them in the bag with beverages. Mr. Zhen PCP is Dr. Kennith Gain in [place].

## 2021-08-01 NOTE — H&P (Signed)
History of Present Illness: Jeremiah Allen is a 36 y.o. male who is seen today for follow up of metastatic rectal cancer. He was first diagnosed in March after presenting with bleeding per rectum for several months. Subsequent staging workup showed 3 lesions in the right liver consistent with metastatic disease. He has been treated with 6 cycles neoadjuvant FOLFOX (last cycle was on 6/6) and recent restaging scans showed a decrease in size of the liver tumors. He is here to discuss resection. He denies any abdominal pain, nausea or vomiting and has a good appetite. 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 He recently saw Dr. Marcello Moores to discuss resection of the primary tumor.   Review of Systems: A complete review of systems was obtained from the patient.  I have reviewed this information and discussed as appropriate with the patient.  See HPI as well for other ROS.       Medical History: Past Medical History      Past Medical History:  Diagnosis Date   Anxiety     History of cancer             Patient Active Problem List  Diagnosis   Rectal cancer metastasized to liver (CMS-HCC)      Past Surgical History       Past Surgical History:  Procedure Laterality Date   TONSILLECTOMY   1996   FLEXIBLE SIGMOIDOSCOPY   04/16/2021    (Inpatient) INVASIVE MODERATELY DIFFERENTIATED/Repeat 47yrTKT        Allergies       Allergies  Allergen Reactions   Dilaudid [Hydromorphone] Other (See Comments)      Skin tingling sensations               Current Outpatient Medications on File Prior to Visit  Medication Sig Dispense Refill   acetaminophen (TYLENOL) 500 MG tablet Take by mouth       ARIPiprazole (ABILIFY) 2 MG tablet Take 5 mg by mouth once daily       gabapentin (NEURONTIN) 300 MG capsule Take 600 mg by mouth 3 (three) times daily       hydrOXYzine HCL (ATARAX) 10 MG tablet Take 25 mg by mouth       traMADoL (ULTRAM) 50 mg tablet Take 50 mg by  mouth every 8 (eight) hours as needed        No current facility-administered medications on file prior to visit.      Family History       Family History  Problem Relation Age of Onset   High blood pressure (Hypertension) Mother          Social History        Tobacco Use  Smoking Status Some Days   Types: Cigarettes  Smokeless Tobacco Current      Social History  Social History         Socioeconomic History   Marital status: Single  Tobacco Use   Smoking status: Some Days      Types: Cigarettes   Smokeless tobacco: Current  Substance and Sexual Activity   Alcohol use: Never   Drug use: Never        Objective:         Vitals:    07/29/21 0946  BP: 126/72  Pulse: 82  Temp: 36.6 C (97.8 F)  SpO2: 98%  Weight: 72.3 kg (159 lb 6.4 oz)  Height: 175.3 cm ('5\' 9"'$ )    Body mass index  is 23.54 kg/m.   Physical Exam Vitals reviewed.  Constitutional:      Appearance: Normal appearance.  Eyes:     General: No scleral icterus.    Conjunctiva/sclera: Conjunctivae normal.  Pulmonary:     Effort: Pulmonary effort is normal. No respiratory distress.     Breath sounds: Normal breath sounds. No wheezing.  Abdominal:     General: There is no distension.     Palpations: Abdomen is soft.     Tenderness: There is no abdominal tenderness.     Comments: No surgical scars.  Musculoskeletal:        General: No swelling or deformity. Normal range of motion.  Skin:    General: Skin is warm and dry.     Coloration: Skin is not jaundiced.  Neurological:     General: No focal deficit present.     Mental Status: He is alert and oriented to person, place, and time.  Psychiatric:        Mood and Affect: Mood normal.        Behavior: Behavior normal.        Thought Content: Thought content normal.          Labs, Imaging and Diagnostic Testing: CT abd/pelvis 07/17/21: IMPRESSION: 1. Decreased irregular rectal wall thickening consistent with treatment response. 2.  Decreased size of the hepatic metastases in perirectal lymph nodes, also consistent with treatment response. 3. No convincing evidence of new or progressive metastatic disease in the chest, abdomen or pelvis. 4. Lingular consolidation with air bronchograms is most consistent with an infectious or inflammatory etiology, consider short-term interval follow-up chest CT to ensure resolution.     Assessment and Plan:     Diagnoses and all orders for this visit:   Rectal cancer metastasized to liver (CMS-HCC)       This is a 36 yo male with rectal cancer with oligometastatic disease to the liver. I have personally reviewed his imaging. He has completed 6 cycles FOLFOX and restaging scans demonstrate a response of the liver lesions and the primary. There were 3 liver lesions in the periphery of the right liver prior to treatment, only 2 of which are now visible. The third is not visible. I discussed with the patient and his wife the different resection options, including a staged procedure with separate surgeries for the liver and primary, vs a combined operation with Dr. Marcello Moores to resect both at the same time. I discussed with Dr. Marcello Moores and Dr. Benay Spice, and the patient will need to complete total neoadjuvant chemo and radiation prior to resection of the rectal primary. As he has already had a significant response to treatment, I am concerned the liver tumors will be extremely difficult or impossible to identify intra-op due to treatment effect if the patient completes further chemo prior to liver resection. Thus I recommended proceeding with a liver resection first. After recovery he will then resume chemotherapy followed by radiation and resection of the primary. Dr. Benay Spice and Dr. Marcello Moores are both in agreement with this plan. Will schedule the patient for surgery next week, which will be 4 weeks from his last cycle of chemotherapy. I will plan to perform a diagnostic laparoscopy and open wedge  resection of each of his liver lesions, with intraoperative ultrasound. Based on the very posterior location of one of the tumors, a laparoscopic approach will not be feasible. The patient agrees to proceed with surgery.  Michaelle Birks, MD [facility] Surgery General, Hepatobiliary and Pancreatic Surgery  08/01/21 12:56 PM

## 2021-08-01 NOTE — H&P (View-Only) (Signed)
History of Present Illness: Jeremiah Allen is a 36 y.o. male who is seen today for follow up of metastatic rectal cancer. He was first diagnosed in March after presenting with bleeding per rectum for several months. Subsequent staging workup showed 3 lesions in the right liver consistent with metastatic disease. He has been treated with 6 cycles neoadjuvant FOLFOX (last cycle was on 6/6) and recent restaging scans showed a decrease in size of the liver tumors. He is here to discuss resection. He denies any abdominal pain, nausea or vomiting and has a good appetite. 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 He recently saw Dr. Marcello Moores to discuss resection of the primary tumor.   Review of Systems: A complete review of systems was obtained from the patient.  I have reviewed this information and discussed as appropriate with the patient.  See HPI as well for other ROS.       Medical History: Past Medical History      Past Medical History:  Diagnosis Date   Anxiety     History of cancer             Patient Active Problem List  Diagnosis   Rectal cancer metastasized to liver (CMS-HCC)      Past Surgical History       Past Surgical History:  Procedure Laterality Date   TONSILLECTOMY   1996   FLEXIBLE SIGMOIDOSCOPY   04/16/2021    (Inpatient) INVASIVE MODERATELY DIFFERENTIATED/Repeat 10yrTKT        Allergies       Allergies  Allergen Reactions   Dilaudid [Hydromorphone] Other (See Comments)      Skin tingling sensations               Current Outpatient Medications on File Prior to Visit  Medication Sig Dispense Refill   acetaminophen (TYLENOL) 500 MG tablet Take by mouth       ARIPiprazole (ABILIFY) 2 MG tablet Take 5 mg by mouth once daily       gabapentin (NEURONTIN) 300 MG capsule Take 600 mg by mouth 3 (three) times daily       hydrOXYzine HCL (ATARAX) 10 MG tablet Take 25 mg by mouth       traMADoL (ULTRAM) 50 mg tablet Take 50 mg by  mouth every 8 (eight) hours as needed        No current facility-administered medications on file prior to visit.      Family History       Family History  Problem Relation Age of Onset   High blood pressure (Hypertension) Mother          Social History        Tobacco Use  Smoking Status Some Days   Types: Cigarettes  Smokeless Tobacco Current      Social History  Social History         Socioeconomic History   Marital status: Single  Tobacco Use   Smoking status: Some Days      Types: Cigarettes   Smokeless tobacco: Current  Substance and Sexual Activity   Alcohol use: Never   Drug use: Never        Objective:         Vitals:    07/29/21 0946  BP: 126/72  Pulse: 82  Temp: 36.6 C (97.8 F)  SpO2: 98%  Weight: 72.3 kg (159 lb 6.4 oz)  Height: 175.3 cm ('5\' 9"'$ )    Body mass index  is 23.54 kg/m.   Physical Exam Vitals reviewed.  Constitutional:      Appearance: Normal appearance.  Eyes:     General: No scleral icterus.    Conjunctiva/sclera: Conjunctivae normal.  Pulmonary:     Effort: Pulmonary effort is normal. No respiratory distress.     Breath sounds: Normal breath sounds. No wheezing.  Abdominal:     General: There is no distension.     Palpations: Abdomen is soft.     Tenderness: There is no abdominal tenderness.     Comments: No surgical scars.  Musculoskeletal:        General: No swelling or deformity. Normal range of motion.  Skin:    General: Skin is warm and dry.     Coloration: Skin is not jaundiced.  Neurological:     General: No focal deficit present.     Mental Status: He is alert and oriented to person, place, and time.  Psychiatric:        Mood and Affect: Mood normal.        Behavior: Behavior normal.        Thought Content: Thought content normal.          Labs, Imaging and Diagnostic Testing: CT abd/pelvis 07/17/21: IMPRESSION: 1. Decreased irregular rectal wall thickening consistent with treatment response. 2.  Decreased size of the hepatic metastases in perirectal lymph nodes, also consistent with treatment response. 3. No convincing evidence of new or progressive metastatic disease in the chest, abdomen or pelvis. 4. Lingular consolidation with air bronchograms is most consistent with an infectious or inflammatory etiology, consider short-term interval follow-up chest CT to ensure resolution.     Assessment and Plan:     Diagnoses and all orders for this visit:   Rectal cancer metastasized to liver (CMS-HCC)       This is a 36 yo male with rectal cancer with oligometastatic disease to the liver. I have personally reviewed his imaging. He has completed 6 cycles FOLFOX and restaging scans demonstrate a response of the liver lesions and the primary. There were 3 liver lesions in the periphery of the right liver prior to treatment, only 2 of which are now visible. The third is not visible. I discussed with the patient and his wife the different resection options, including a staged procedure with separate surgeries for the liver and primary, vs a combined operation with Dr. Marcello Moores to resect both at the same time. I discussed with Dr. Marcello Moores and Dr. Benay Spice, and the patient will need to complete total neoadjuvant chemo and radiation prior to resection of the rectal primary. As he has already had a significant response to treatment, I am concerned the liver tumors will be extremely difficult or impossible to identify intra-op due to treatment effect if the patient completes further chemo prior to liver resection. Thus I recommended proceeding with a liver resection first. After recovery he will then resume chemotherapy followed by radiation and resection of the primary. Dr. Benay Spice and Dr. Marcello Moores are both in agreement with this plan. Will schedule the patient for surgery next week, which will be 4 weeks from his last cycle of chemotherapy. I will plan to perform a diagnostic laparoscopy and open wedge  resection of each of his liver lesions, with intraoperative ultrasound. Based on the very posterior location of one of the tumors, a laparoscopic approach will not be feasible. The patient agrees to proceed with surgery.  Michaelle Birks, MD [facility] Surgery General, Hepatobiliary and Pancreatic Surgery  08/01/21 12:56 PM

## 2021-08-01 NOTE — Progress Notes (Signed)
Surgery planned for 7/3. Dr. Benay Spice wants to see patient in office 2 weeks postop. Scheduling message sent.

## 2021-08-04 ENCOUNTER — Encounter (HOSPITAL_COMMUNITY)
Admission: RE | Disposition: A | Discharge status: Home / Self Care | Payer: Self-pay | Source: Home / Self Care | Attending: Surgery

## 2021-08-04 ENCOUNTER — Other Ambulatory Visit: Payer: Self-pay

## 2021-08-04 ENCOUNTER — Inpatient Hospital Stay (HOSPITAL_COMMUNITY): Payer: Medicaid Other | Admitting: Certified Registered"

## 2021-08-04 ENCOUNTER — Inpatient Hospital Stay (HOSPITAL_COMMUNITY)
Admission: RE | Admit: 2021-08-04 | Discharge: 2021-08-07 | DRG: 357 | Disposition: A | Discharge status: Home / Self Care | Payer: Medicaid Other | Attending: Surgery | Admitting: Surgery

## 2021-08-04 ENCOUNTER — Encounter (HOSPITAL_COMMUNITY): Payer: Self-pay | Admitting: Surgery

## 2021-08-04 DIAGNOSIS — F17210 Nicotine dependence, cigarettes, uncomplicated: Secondary | ICD-10-CM | POA: Diagnosis present

## 2021-08-04 DIAGNOSIS — F419 Anxiety disorder, unspecified: Secondary | ICD-10-CM | POA: Diagnosis present

## 2021-08-04 DIAGNOSIS — Z9221 Personal history of antineoplastic chemotherapy: Secondary | ICD-10-CM | POA: Diagnosis not present

## 2021-08-04 DIAGNOSIS — G629 Polyneuropathy, unspecified: Secondary | ICD-10-CM | POA: Diagnosis not present

## 2021-08-04 DIAGNOSIS — C785 Secondary malignant neoplasm of large intestine and rectum: Secondary | ICD-10-CM

## 2021-08-04 DIAGNOSIS — Z79899 Other long term (current) drug therapy: Secondary | ICD-10-CM

## 2021-08-04 DIAGNOSIS — Z79891 Long term (current) use of opiate analgesic: Secondary | ICD-10-CM

## 2021-08-04 DIAGNOSIS — F319 Bipolar disorder, unspecified: Secondary | ICD-10-CM

## 2021-08-04 DIAGNOSIS — D630 Anemia in neoplastic disease: Secondary | ICD-10-CM | POA: Diagnosis not present

## 2021-08-04 DIAGNOSIS — G620 Drug-induced polyneuropathy: Secondary | ICD-10-CM | POA: Diagnosis present

## 2021-08-04 DIAGNOSIS — T451X5A Adverse effect of antineoplastic and immunosuppressive drugs, initial encounter: Secondary | ICD-10-CM | POA: Diagnosis present

## 2021-08-04 DIAGNOSIS — C787 Secondary malignant neoplasm of liver and intrahepatic bile duct: Secondary | ICD-10-CM | POA: Diagnosis present

## 2021-08-04 DIAGNOSIS — C801 Malignant (primary) neoplasm, unspecified: Secondary | ICD-10-CM | POA: Diagnosis not present

## 2021-08-04 DIAGNOSIS — Z885 Allergy status to narcotic agent status: Secondary | ICD-10-CM

## 2021-08-04 DIAGNOSIS — G8918 Other acute postprocedural pain: Secondary | ICD-10-CM | POA: Diagnosis not present

## 2021-08-04 DIAGNOSIS — Z8249 Family history of ischemic heart disease and other diseases of the circulatory system: Secondary | ICD-10-CM

## 2021-08-04 DIAGNOSIS — C20 Malignant neoplasm of rectum: Secondary | ICD-10-CM | POA: Diagnosis not present

## 2021-08-04 HISTORY — PX: OPEN PARTIAL HEPATECTOMY [83]: SHX5987

## 2021-08-04 HISTORY — DX: Malignant (primary) neoplasm, unspecified: C80.1

## 2021-08-04 HISTORY — DX: Gastro-esophageal reflux disease without esophagitis: K21.9

## 2021-08-04 HISTORY — DX: Polyneuropathy, unspecified: G62.9

## 2021-08-04 HISTORY — DX: Bipolar disorder, unspecified: F31.9

## 2021-08-04 HISTORY — PX: LAPAROSCOPY: SHX197

## 2021-08-04 HISTORY — DX: Anxiety disorder, unspecified: F41.9

## 2021-08-04 LAB — PREPARE RBC (CROSSMATCH)

## 2021-08-04 LAB — SURGICAL PCR SCREEN
MRSA, PCR: NEGATIVE
Staphylococcus aureus: POSITIVE — AB

## 2021-08-04 SURGERY — HEPATECTOMY, PARTIAL, OPEN
Anesthesia: General | Site: Abdomen

## 2021-08-04 MED ORDER — MIDAZOLAM HCL 2 MG/2ML IJ SOLN
INTRAMUSCULAR | Status: AC
  Filled 2021-08-04: qty 2

## 2021-08-04 MED ORDER — BUPIVACAINE HCL (PF) 0.25 % IJ SOLN: INTRAMUSCULAR | Status: DC | PRN

## 2021-08-04 MED ORDER — DEXAMETHASONE SODIUM PHOSPHATE 10 MG/ML IJ SOLN
INTRAMUSCULAR | Status: AC
  Filled 2021-08-04: qty 1

## 2021-08-04 MED ORDER — DEXMEDETOMIDINE (PRECEDEX) IN NS 20 MCG/5ML (4 MCG/ML) IV SYRINGE
PREFILLED_SYRINGE | INTRAVENOUS | Status: DC | PRN
  Administered 2021-08-04: 200 ug via INTRAVENOUS
  Administered 2021-08-04: 12 ug via INTRAVENOUS
  Administered 2021-08-04: 8 ug via INTRAVENOUS

## 2021-08-04 MED ORDER — ROCURONIUM BROMIDE 10 MG/ML (PF) SYRINGE
PREFILLED_SYRINGE | INTRAVENOUS | Status: DC | PRN
  Administered 2021-08-04: 50 mg via INTRAVENOUS
  Administered 2021-08-04: 60 mg via INTRAVENOUS
  Administered 2021-08-04: 40 mg via INTRAVENOUS

## 2021-08-04 MED ORDER — ARIPIPRAZOLE 10 MG PO TABS
5.0000 mg | ORAL_TABLET | Freq: Every day | ORAL | Status: DC
  Administered 2021-08-04 – 2021-08-06 (×3): 5 mg via ORAL
  Filled 2021-08-04 (×6): qty 1

## 2021-08-04 MED ORDER — LIDOCAINE 2% (20 MG/ML) 5 ML SYRINGE
INTRAMUSCULAR | Status: AC
  Filled 2021-08-04: qty 10

## 2021-08-04 MED ORDER — FENTANYL CITRATE (PF) 100 MCG/2ML IJ SOLN
INTRAMUSCULAR | Status: AC
  Filled 2021-08-04: qty 2

## 2021-08-04 MED ORDER — LACTATED RINGERS IV SOLN: INTRAVENOUS | Status: DC | PRN

## 2021-08-04 MED ORDER — ENOXAPARIN SODIUM 40 MG/0.4ML IJ SOSY
40.0000 mg | PREFILLED_SYRINGE | INTRAMUSCULAR | Status: DC
  Administered 2021-08-05 – 2021-08-06 (×2): 40 mg via SUBCUTANEOUS
  Filled 2021-08-04 (×2): qty 0.4

## 2021-08-04 MED ORDER — MORPHINE SULFATE (PF) 4 MG/ML IV SOLN
INTRAVENOUS | Status: AC
  Filled 2021-08-04: qty 1

## 2021-08-04 MED ORDER — MORPHINE SULFATE (PF) 2 MG/ML IV SOLN
INTRAVENOUS | Status: AC
  Filled 2021-08-04: qty 1

## 2021-08-04 MED ORDER — SODIUM CHLORIDE 0.9 % IR SOLN
Status: DC | PRN
  Administered 2021-08-04: 1000 mL

## 2021-08-04 MED ORDER — GABAPENTIN 300 MG PO CAPS
ORAL_CAPSULE | ORAL | Status: AC
  Administered 2021-08-04: 300 mg via ORAL
  Filled 2021-08-04: qty 1

## 2021-08-04 MED ORDER — CEFAZOLIN SODIUM-DEXTROSE 2-4 GM/100ML-% IV SOLN
INTRAVENOUS | Status: AC
  Filled 2021-08-04: qty 100

## 2021-08-04 MED ORDER — FENTANYL CITRATE (PF) 250 MCG/5ML IJ SOLN
INTRAMUSCULAR | Status: DC | PRN
  Administered 2021-08-04: 50 ug via INTRAVENOUS
  Administered 2021-08-04: 100 ug via INTRAVENOUS
  Administered 2021-08-04 (×2): 50 ug via INTRAVENOUS

## 2021-08-04 MED ORDER — LACTATED RINGERS IV SOLN
INTRAVENOUS | Status: DC
Start: 2021-08-04 — End: 2021-08-04

## 2021-08-04 MED ORDER — CHLORHEXIDINE GLUCONATE 0.12 % MT SOLN
OROMUCOSAL | Status: AC
  Administered 2021-08-04: 15 mL via OROMUCOSAL
  Filled 2021-08-04: qty 15

## 2021-08-04 MED ORDER — EPHEDRINE 5 MG/ML INJ
INTRAVENOUS | Status: AC
  Filled 2021-08-04: qty 5

## 2021-08-04 MED ORDER — ONDANSETRON HCL 4 MG/2ML IJ SOLN
INTRAMUSCULAR | Status: AC
  Filled 2021-08-04: qty 2

## 2021-08-04 MED ORDER — ALBUTEROL SULFATE HFA 108 (90 BASE) MCG/ACT IN AERS: 1.0000 | INHALATION_SPRAY | Freq: Four times a day (QID) | RESPIRATORY_TRACT | Status: DC | PRN

## 2021-08-04 MED ORDER — 0.9 % SODIUM CHLORIDE (POUR BTL) OPTIME
TOPICAL | Status: DC | PRN
  Administered 2021-08-04 (×2): 1000 mL

## 2021-08-04 MED ORDER — ONDANSETRON HCL 4 MG/2ML IJ SOLN: 4.0000 mg | Freq: Four times a day (QID) | INTRAMUSCULAR | Status: DC | PRN

## 2021-08-04 MED ORDER — PROPOFOL 10 MG/ML IV BOLUS
INTRAVENOUS | Status: AC
  Filled 2021-08-04: qty 20

## 2021-08-04 MED ORDER — ALBUTEROL SULFATE (2.5 MG/3ML) 0.083% IN NEBU: 2.5000 mg | INHALATION_SOLUTION | Freq: Four times a day (QID) | RESPIRATORY_TRACT | Status: DC | PRN

## 2021-08-04 MED ORDER — ACETAMINOPHEN 500 MG PO TABS
1000.0000 mg | ORAL_TABLET | Freq: Three times a day (TID) | ORAL | Status: DC
Start: 2021-08-04 — End: 2021-08-07
  Administered 2021-08-05 – 2021-08-07 (×6): 1000 mg via ORAL
  Filled 2021-08-04 (×8): qty 2

## 2021-08-04 MED ORDER — GABAPENTIN 300 MG PO CAPS: 300.0000 mg | ORAL_CAPSULE | ORAL | Status: AC

## 2021-08-04 MED ORDER — GABAPENTIN 600 MG PO TABS
600.0000 mg | ORAL_TABLET | Freq: Three times a day (TID) | ORAL | Status: DC
  Administered 2021-08-04 – 2021-08-06 (×7): 600 mg via ORAL
  Filled 2021-08-04 (×7): qty 1

## 2021-08-04 MED ORDER — MORPHINE SULFATE (PF) 4 MG/ML IV SOLN
4.0000 mg | INTRAVENOUS | Status: DC | PRN
  Administered 2021-08-04 – 2021-08-07 (×9): 4 mg via INTRAVENOUS
  Filled 2021-08-04 (×10): qty 1

## 2021-08-04 MED ORDER — ACETAMINOPHEN 500 MG PO TABS: 1000.0000 mg | ORAL_TABLET | ORAL | Status: AC

## 2021-08-04 MED ORDER — METHOCARBAMOL 750 MG PO TABS
750.0000 mg | ORAL_TABLET | Freq: Four times a day (QID) | ORAL | Status: DC
Start: 2021-08-04 — End: 2021-08-05
  Administered 2021-08-04: 750 mg via ORAL
  Filled 2021-08-04: qty 1

## 2021-08-04 MED ORDER — BUPIVACAINE HCL (PF) 0.25 % IJ SOLN
INTRAMUSCULAR | Status: AC
  Filled 2021-08-04: qty 30

## 2021-08-04 MED ORDER — LACTATED RINGERS IV SOLN: INTRAVENOUS | Status: DC

## 2021-08-04 MED ORDER — ALBUMIN HUMAN 5 % IV SOLN: INTRAVENOUS | Status: DC | PRN

## 2021-08-04 MED ORDER — MIDAZOLAM HCL 2 MG/2ML IJ SOLN
INTRAMUSCULAR | Status: DC | PRN
  Administered 2021-08-04: 2 mg via INTRAVENOUS

## 2021-08-04 MED ORDER — HYDROXYZINE HCL 25 MG PO TABS: 25.0000 mg | ORAL_TABLET | Freq: Four times a day (QID) | ORAL | Status: DC | PRN

## 2021-08-04 MED ORDER — MORPHINE SULFATE (PF) 2 MG/ML IV SOLN
1.0000 mg | INTRAVENOUS | Status: DC | PRN
  Administered 2021-08-04 (×6): 2 mg via INTRAVENOUS

## 2021-08-04 MED ORDER — ENSURE PRE-SURGERY PO LIQD: 296.0000 mL | Freq: Once | ORAL | Status: DC

## 2021-08-04 MED ORDER — ACETAMINOPHEN 500 MG PO TABS
ORAL_TABLET | ORAL | Status: AC
  Administered 2021-08-04: 1000 mg via ORAL
  Filled 2021-08-04: qty 2

## 2021-08-04 MED ORDER — ROCURONIUM BROMIDE 10 MG/ML (PF) SYRINGE
PREFILLED_SYRINGE | INTRAVENOUS | Status: AC
  Filled 2021-08-04: qty 20

## 2021-08-04 MED ORDER — CHLORHEXIDINE GLUCONATE 0.12 % MT SOLN
15.0000 mL | Freq: Once | OROMUCOSAL | Status: AC
Start: 2021-08-04 — End: 2021-08-04

## 2021-08-04 MED ORDER — CEFAZOLIN SODIUM-DEXTROSE 2-4 GM/100ML-% IV SOLN
2.0000 g | INTRAVENOUS | Status: AC
  Administered 2021-08-04: 2 g via INTRAVENOUS

## 2021-08-04 MED ORDER — BUPIVACAINE HCL (PF) 0.25 % IJ SOLN
INTRAMUSCULAR | Status: DC | PRN
  Administered 2021-08-04 (×2): 20 mL

## 2021-08-04 MED ORDER — OXYCODONE HCL 5 MG PO TABS
5.0000 mg | ORAL_TABLET | ORAL | Status: DC | PRN
  Administered 2021-08-04 – 2021-08-06 (×7): 10 mg via ORAL
  Filled 2021-08-04 (×7): qty 2

## 2021-08-04 MED ORDER — FAMOTIDINE 20 MG PO TABS
20.0000 mg | ORAL_TABLET | Freq: Two times a day (BID) | ORAL | Status: DC
  Administered 2021-08-04 – 2021-08-06 (×5): 20 mg via ORAL
  Filled 2021-08-04 (×5): qty 1

## 2021-08-04 MED ORDER — PHENYLEPHRINE 80 MCG/ML (10ML) SYRINGE FOR IV PUSH (FOR BLOOD PRESSURE SUPPORT)
PREFILLED_SYRINGE | INTRAVENOUS | Status: AC
  Filled 2021-08-04: qty 20

## 2021-08-04 MED ORDER — ORAL CARE MOUTH RINSE: 15.0000 mL | Freq: Once | OROMUCOSAL | Status: AC

## 2021-08-04 MED ORDER — DOCUSATE SODIUM 100 MG PO CAPS
100.0000 mg | ORAL_CAPSULE | Freq: Two times a day (BID) | ORAL | Status: DC
  Administered 2021-08-04 – 2021-08-06 (×5): 100 mg via ORAL
  Filled 2021-08-04 (×5): qty 1

## 2021-08-04 MED ORDER — SUCCINYLCHOLINE CHLORIDE 200 MG/10ML IV SOSY
PREFILLED_SYRINGE | INTRAVENOUS | Status: AC
  Filled 2021-08-04: qty 10

## 2021-08-04 MED ORDER — DEXAMETHASONE SODIUM PHOSPHATE 10 MG/ML IJ SOLN
INTRAMUSCULAR | Status: DC | PRN
  Administered 2021-08-04: 10 mg via INTRAVENOUS

## 2021-08-04 MED ORDER — KETOROLAC TROMETHAMINE 15 MG/ML IJ SOLN
15.0000 mg | Freq: Three times a day (TID) | INTRAMUSCULAR | Status: DC
Start: 2021-08-04 — End: 2021-08-07
  Administered 2021-08-05 – 2021-08-07 (×5): 15 mg via INTRAVENOUS
  Filled 2021-08-04 (×7): qty 1

## 2021-08-04 MED ORDER — ONDANSETRON HCL 4 MG/2ML IJ SOLN: 4.0000 mg | Freq: Once | INTRAMUSCULAR | Status: DC | PRN

## 2021-08-04 MED ORDER — DEXMEDETOMIDINE HCL IN NACL 80 MCG/20ML IV SOLN
INTRAVENOUS | Status: AC
  Filled 2021-08-04: qty 20

## 2021-08-04 MED ORDER — PHENYLEPHRINE 80 MCG/ML (10ML) SYRINGE FOR IV PUSH (FOR BLOOD PRESSURE SUPPORT)
PREFILLED_SYRINGE | INTRAVENOUS | Status: DC | PRN
  Administered 2021-08-04 (×4): 80 ug via INTRAVENOUS

## 2021-08-04 MED ORDER — BUPIVACAINE LIPOSOME 1.3 % IJ SUSP
INTRAMUSCULAR | Status: DC | PRN
  Administered 2021-08-04 (×2): 10 mL

## 2021-08-04 MED ORDER — AMISULPRIDE (ANTIEMETIC) 5 MG/2ML IV SOLN: 10.0000 mg | Freq: Once | INTRAVENOUS | Status: DC | PRN

## 2021-08-04 MED ORDER — ONDANSETRON 4 MG PO TBDP: 4.0000 mg | ORAL_TABLET | Freq: Four times a day (QID) | ORAL | Status: DC | PRN

## 2021-08-04 MED ORDER — PROPOFOL 10 MG/ML IV BOLUS
INTRAVENOUS | Status: DC | PRN
  Administered 2021-08-04: 30 mg via INTRAVENOUS
  Administered 2021-08-04: 100 mg via INTRAVENOUS

## 2021-08-04 MED ORDER — SODIUM CHLORIDE 0.9% IV SOLUTION: Freq: Once | INTRAVENOUS | Status: AC

## 2021-08-04 MED ORDER — ONDANSETRON HCL 4 MG/2ML IJ SOLN
INTRAMUSCULAR | Status: DC | PRN
  Administered 2021-08-04: 4 mg via INTRAVENOUS

## 2021-08-04 MED ORDER — FENTANYL CITRATE (PF) 250 MCG/5ML IJ SOLN
INTRAMUSCULAR | Status: AC
  Filled 2021-08-04: qty 5

## 2021-08-04 MED ORDER — ENSURE PRE-SURGERY PO LIQD: 592.0000 mL | Freq: Once | ORAL | Status: DC

## 2021-08-04 SURGICAL SUPPLY — 79 items
BAG COUNTER SPONGE SURGICOUNT (BAG) ×2 IMPLANT
BIOPATCH RED 1 DISK 7.0 (GAUZE/BANDAGES/DRESSINGS) ×2 IMPLANT
BLADE CLIPPER SURG (BLADE) ×1 IMPLANT
CANISTER SUCT 3000ML PPV (MISCELLANEOUS) ×2 IMPLANT
CHLORAPREP W/TINT 26 (MISCELLANEOUS) ×2 IMPLANT
CLIP TI MEDIUM 24 (CLIP) ×2 IMPLANT
CLIP TI WIDE RED SMALL 24 (CLIP) ×2 IMPLANT
CLIP VESOCCLUDE MED 24/CT (CLIP) ×2 IMPLANT
COVER SURGICAL LIGHT HANDLE (MISCELLANEOUS) ×2 IMPLANT
DERMABOND ADVANCED (GAUZE/BANDAGES/DRESSINGS) ×1
DERMABOND ADVANCED .7 DNX12 (GAUZE/BANDAGES/DRESSINGS) ×2 IMPLANT
DRAIN CHANNEL 19F RND (DRAIN) ×1 IMPLANT
DRAPE INCISE IOBAN 66X45 STRL (DRAPES) ×2 IMPLANT
DRAPE LAPAROSCOPIC ABDOMINAL (DRAPES) ×2 IMPLANT
DRAPE WARM FLUID 44X44 (DRAPES) ×2 IMPLANT
DRSG TEGADERM 4X4.75 (GAUZE/BANDAGES/DRESSINGS) ×2 IMPLANT
DRSG TELFA 3X8 NADH (GAUZE/BANDAGES/DRESSINGS) IMPLANT
ELECT BLADE 6.5 EXT (BLADE) ×2 IMPLANT
ELECT CAUTERY BLADE 6.4 (BLADE) ×2 IMPLANT
ELECT PAD DSPR THERM+ ADLT (MISCELLANEOUS) ×2 IMPLANT
ELECT REM PT RETURN 9FT ADLT (ELECTROSURGICAL) ×2
ELECTRODE REM PT RTRN 9FT ADLT (ELECTROSURGICAL) ×1 IMPLANT
EVACUATOR SILICONE 100CC (DRAIN) ×1 IMPLANT
GAUZE 4X4 16PLY ~~LOC~~+RFID DBL (SPONGE) IMPLANT
GLOVE BIOGEL PI IND STRL 6 (GLOVE) ×1 IMPLANT
GLOVE BIOGEL PI INDICATOR 6 (GLOVE) ×1
GLOVE BIOGEL PI MICRO 5.5 (GLOVE) ×2
GLOVE BIOGEL PI MICRO STRL 5.5 (GLOVE) ×2 IMPLANT
GLOVE SURG POLY MICRO LF SZ5.5 (GLOVE) ×2 IMPLANT
GLOVE SURG UNDER POLY LF SZ6 (GLOVE) ×2 IMPLANT
GOWN STRL REUS W/ TWL LRG LVL3 (GOWN DISPOSABLE) ×2 IMPLANT
GOWN STRL REUS W/TWL LRG LVL3 (GOWN DISPOSABLE) ×4
HAND PENCIL TRP OPTION (MISCELLANEOUS) ×2 IMPLANT
HANDLE SUCTION POOLE (INSTRUMENTS) ×1 IMPLANT
HEMOSTAT HEMOBLAST BELLOWS (HEMOSTASIS) IMPLANT
HEMOSTAT SNOW SURGICEL 2X4 (HEMOSTASIS) ×1 IMPLANT
KIT BASIN OR (CUSTOM PROCEDURE TRAY) ×2 IMPLANT
KIT TURNOVER KIT B (KITS) ×2 IMPLANT
LIGASURE IMPACT 36 18CM CVD LR (INSTRUMENTS) IMPLANT
NDL INSUFFLATION 14GA 120MM (NEEDLE) ×1 IMPLANT
NEEDLE INSUFFLATION 14GA 120MM (NEEDLE) ×2 IMPLANT
NS IRRIG 1000ML POUR BTL (IV SOLUTION) ×4 IMPLANT
PAD ARMBOARD 7.5X6 YLW CONV (MISCELLANEOUS) ×4 IMPLANT
PAD DRESSING TELFA 3X8 NADH (GAUZE/BANDAGES/DRESSINGS) IMPLANT
PENCIL SMOKE EVACUATOR (MISCELLANEOUS) ×2 IMPLANT
SEALER BIPOLAR AQUA 6.0 (INSTRUMENTS) ×2 IMPLANT
SET IRRIG TUBING LAPAROSCOPIC (IRRIGATION / IRRIGATOR) IMPLANT
SET TUBE SMOKE EVAC HIGH FLOW (TUBING) ×2 IMPLANT
SLEEVE ENDOPATH XCEL 5M (ENDOMECHANICALS) ×2 IMPLANT
SPONGE T-LAP 18X18 ~~LOC~~+RFID (SPONGE) ×3 IMPLANT
SUCTION POOLE HANDLE (INSTRUMENTS) ×2
SUT CHROMIC 0 BP (SUTURE) ×2 IMPLANT
SUT CHROMIC 3 0 CT 36 (SUTURE) IMPLANT
SUT ETHILON 2 0 FS 18 (SUTURE) ×2 IMPLANT
SUT MNCRL AB 4-0 PS2 18 (SUTURE) ×2 IMPLANT
SUT PDS AB 1 TP1 96 (SUTURE) ×4 IMPLANT
SUT PDS AB 4-0 RB1 27 (SUTURE) IMPLANT
SUT PROLENE 2 0 SH DA (SUTURE) IMPLANT
SUT PROLENE 3 0 SH 48 (SUTURE) ×3 IMPLANT
SUT PROLENE 4 0 RB 1 (SUTURE) ×4
SUT PROLENE 4-0 RB1 .5 CRCL 36 (SUTURE) ×2 IMPLANT
SUT SILK 2 0 SH (SUTURE) IMPLANT
SUT SILK 2 0 TIES 10X30 (SUTURE) ×2 IMPLANT
SUT SILK 2 0SH CR/8 30 (SUTURE) ×1 IMPLANT
SUT SILK 3 0 TIES 10X30 (SUTURE) IMPLANT
SUT SILK 3 0SH CR/8 30 (SUTURE) IMPLANT
SUT VIC AB 3-0 SH 27 (SUTURE) ×6
SUT VIC AB 3-0 SH 27X BRD (SUTURE) ×1 IMPLANT
SUT VIC AB 3-0 SH 27XBRD (SUTURE) IMPLANT
SYR BULB IRRIG 60ML STRL (SYRINGE) ×2 IMPLANT
TOWEL GREEN STERILE (TOWEL DISPOSABLE) ×2 IMPLANT
TOWEL GREEN STERILE FF (TOWEL DISPOSABLE) ×2 IMPLANT
TRAY FOLEY MTR SLVR 14FR STAT (SET/KITS/TRAYS/PACK) ×2 IMPLANT
TRAY LAPAROSCOPIC MC (CUSTOM PROCEDURE TRAY) ×2 IMPLANT
TROCAR XCEL BLUNT TIP 100MML (ENDOMECHANICALS) IMPLANT
TROCAR XCEL NON-BLD 5MMX100MML (ENDOMECHANICALS) ×2 IMPLANT
TROCAR Z-THREAD OPTICAL 5X100M (TROCAR) ×1 IMPLANT
TUBE CONNECTING 12X1/4 (SUCTIONS) ×1 IMPLANT
WARMER LAPAROSCOPE (MISCELLANEOUS) ×2 IMPLANT

## 2021-08-04 NOTE — Anesthesia Procedure Notes (Signed)
Anesthesia Regional Block: TAP block   Pre-Anesthetic Checklist: , timeout performed,  Correct Patient, Correct Site, Correct Laterality,  Correct Procedure, Correct Position, site marked,  Risks and benefits discussed,  Surgical consent,  Pre-op evaluation,  At surgeon's request and post-op pain management  Laterality: Left  Prep: chloraprep       Needles:  Injection technique: Single-shot  Needle Type: Echogenic Stimulator Needle     Needle Length: 9cm  Needle Gauge: 21     Additional Needles:   Procedures:,,,, ultrasound used (permanent image in chart),,    Narrative:  Start time: 08/04/2021 1:30 PM End time: 08/04/2021 1:35 PM Injection made incrementally with aspirations every 5 mL.  Performed by: Personally  Anesthesiologist: Effie Berkshire, MD  Additional Notes: Patient tolerated the procedure well. Local anesthetic introduced in an incremental fashion under minimal resistance after negative aspirations. No paresthesias were elicited. After completion of the procedure, no acute issues were identified and patient continued to be monitored by RN.

## 2021-08-04 NOTE — Op Note (Signed)
Date: 08/04/21  Patient: Jeremiah Allen MRN: [id number]  Preoperative Diagnosis: Rectal cancer with liver metastases Postoperative Diagnosis: Same  Procedure:  Staging laparoscopy Intraoperative liver ultrasound Hepatic wedge metastasectomy x3 (segments 6, 7, 8)  Surgeon: Michaelle Birks, MD Assistant: Nedra Hai, MD  EBL: 200 mL  Anesthesia: General endotracheal  Specimens:  Segment 6 liver lesion Segment 8 liver lesion Segment 7 liver lesion  Indications: [name] is a 36 yo male who presented with several months of bleeding per rectum and was found to have a rectal mass. Biopsies confirmed rectal adenocarcinoma, and staging workup showed 3 metastatic lesions in the right liver. He has been treated with 6 cycles of neoadjuvant FOLFOX with downsizing of the liver lesions.  After multidisciplinary discussion, he has agreed to proceed with resection of the liver tumors.  Findings: Three small tumors measuring approximately 1cm each identified in the right liver, corresponding to the metastatic tumors identified on preoperative imaging.  No other evidence of metastatic disease within the abdomen.  Procedure details: Informed consent was obtained in the preoperative area prior to the procedure. The patient was brought to the operating room and placed on the table in the supine position.  General anesthesia was induced and appropriate lines and drains were placed for intraoperative monitoring. Perioperative antibiotics were administered per SCIP guidelines. The abdomen was prepped and draped in the usual sterile fashion. A pre-procedure timeout was taken verifying patient identity, surgical site and procedure to be performed.  A small skin incision was made in the right subcostal margin and the subcutaneous tissue was divided with cautery. The fascia was grasped and elevated and a Veress needle was inserted through the fascia. Intraperitoneal placement was confirmed with the saline drop  test and the abdomen was insufflated. A 33m visiport was placed. The peritoneal cavity was examined, including the liver, both hemidiaphragms, the surface of the bowel, and the peritoneal lining. There were no obvious liver nodules on the visible surface of the liver, and no other signs of metastatic disease within the abdomen. The port was removed and the abdomen was desufflated.  A right subcostal skin incision made, incorporating the port site, and extended onto the upper midline. The subcutaneous tissue was divided with cautery to expose the fascia. The fascia and rectus muscle were divided to enter the peritoneal cavity. The fascial opening was extended onto the linea alba at the upper midline. The falciform ligament was taken down off the abdominal wall, ligated with 2-0 silk ties and divided. A Thompson retractor was placed to elevate the rib cage off the liver. The surface of the liver was examined. There were two superficial subcentimeter nodules visible, one in segment 6 and one at the dome of the liver in segment 8, each corresponding to the lesions noted on preoperative imaging. The right triangular ligaments were taken down to mobilize the right lobe. An intraoperative ultrasound was performed but the third lesion noted in the posterior liver on imaging was not immediately identifiable. There were no other masses elsewhere in the liver on intraoperative ultrasound. The retroperitoneal attachments to the liver were taken down with cautery to further mobilize the right lobe. The posterior surface was inspected and no nodules were visible on the surface. Bimanual palpation was performed and there were no palpable nodules. The decision was made to proceed with resecting the two visible nodules first. The segment 6 lesion was approached first. A gross margin of about 1cm was marked around the nodule with cautery. The nodule was then  resected along this margin, using a crush-clamp technique. The aquamantys  was used to maintain hemostasis, and small visible blood vessels were clipped prior to division. The specimen was sent for routine pathology. Next the dome lesion at segment 8 was approached. A gross margin of approximately 1cm was marked around the nodule using cautery, and the lesion was then resected along the margin using a crush-clamp technique and the Aquamantys. Visible vessels were clipped. The specimen was excised and sent for routine pathology.  The right lobe was fully mobilized by taking down the remaining diaphragm attachments on the dome of the liver. Intraoperative ultrasound was again performed of the right lobe, and a hyperechoic nodule approximately 1cm in diameter, at a depth of about 0.5cm from the surface, was identified in the posterior liver in segment 7. This corresponded to the location of the nodule on preoperative imaging. Ultrasound was used to mark margins around the nodule on the surface of the liver. A wedge resection was then performed along the marked margins using a crush-clamp technique and the aquamantys. Small vessels were clipped, and a hepatic vein branch was sutured ligated with 3-0 prolene. The specimen was excised and examined. There was a palpable nodule within the specimen, and ultrasound of the specimen showed that the entire nodule appeared to be present within the specimen. The specimen was sent for routine pathology. Hemostasis was achieved at each of the resection sites using the aquamantys and argon plasma coagulation. A valsalva was performed and on compression of a clean lap pad to each resection site, there was no evidence of bile leak or active bleeding. The abdomen was irrigated with warmed saline and the retractors were removed. The fascia was closed in 2 layers with a running 1 PDS suture. Scarpa's layer was closed with a running 3-0 Vicryl suture, and the skin was closed with a running subcuticular 4-0 monocryl suture. Dermabond was applied.  The patient  tolerated the procedure well with no apparent complications. All counts were correct x2 at the end of the procedure. The patient was extubated and taken to PACU in stable condition.  Michaelle Birks, MD 08/04/21 5:36 PM

## 2021-08-04 NOTE — Anesthesia Procedure Notes (Signed)
Central Venous Catheter Insertion Performed by: Effie Berkshire, MD, anesthesiologist Start/End7/04/2021 1:05 PM, 08/04/2021 1:15 PM Patient location: Pre-op. Preanesthetic checklist: patient identified, IV checked, site marked, risks and benefits discussed, surgical consent, monitors and equipment checked, pre-op evaluation, timeout performed and anesthesia consent Position: Trendelenburg Lidocaine 1% used for infiltration and patient sedated Hand hygiene performed , maximum sterile barriers used  and Seldinger technique used Catheter size: 8 Fr Total catheter length 16. Central line was placed.Double lumen Procedure performed using ultrasound guided technique. Ultrasound Notes:anatomy identified, needle tip was noted to be adjacent to the nerve/plexus identified, no ultrasound evidence of intravascular and/or intraneural injection and image(s) printed for medical record Attempts: 1 Following insertion, dressing applied, line sutured and Biopatch. Post procedure assessment: blood return through all ports  Patient tolerated the procedure well with no immediate complications.

## 2021-08-04 NOTE — Interval H&P Note (Signed)
History and Physical Interval Note:  08/04/2021 12:54 PM  Jeremiah Allen  has presented today for surgery, with the diagnosis of metastatic rectal cancer.  The various methods of treatment have been discussed with the patient and family. After consideration of risks, benefits and other options for treatment, the patient has consented to  Procedure(s): OPEN PARTIAL HEPATECTOMY (N/A) STAGING LAPAROSCOPY WITH INTRAOPERATIVE ULTRASOUND (N/A) as a surgical intervention.  The patient's history has been reviewed, patient examined, no change in status, stable for surgery.  I have reviewed the patient's chart and labs.  Questions were answered to the patient's satisfaction.     Dwan Bolt

## 2021-08-04 NOTE — Anesthesia Procedure Notes (Signed)
Anesthesia Regional Block: TAP block   Pre-Anesthetic Checklist: , timeout performed,  Correct Patient, Correct Site, Correct Laterality,  Correct Procedure, Correct Position, site marked,  Risks and benefits discussed,  Surgical consent,  Pre-op evaluation,  At surgeon's request and post-op pain management  Laterality: Right  Prep: chloraprep       Needles:  Injection technique: Single-shot  Needle Type: Echogenic Stimulator Needle     Needle Length: 9cm  Needle Gauge: 21     Additional Needles:   Procedures:,,,, ultrasound used (permanent image in chart),,    Narrative:  Start time: 08/04/2021 1:25 PM End time: 08/04/2021 1:30 PM Injection made incrementally with aspirations every 5 mL.  Performed by: Personally  Anesthesiologist: Effie Berkshire, MD  Additional Notes: Patient tolerated the procedure well. Local anesthetic introduced in an incremental fashion under minimal resistance after negative aspirations. No paresthesias were elicited. After completion of the procedure, no acute issues were identified and patient continued to be monitored by RN.

## 2021-08-04 NOTE — Anesthesia Procedure Notes (Signed)
Procedure Name: Intubation Date/Time: 08/04/2021 2:42 PM  Performed by: Griffin Dakin, CRNAPre-anesthesia Checklist: Patient identified, Emergency Drugs available, Suction available and Patient being monitored Patient Re-evaluated:Patient Re-evaluated prior to induction Oxygen Delivery Method: Circle system utilized Preoxygenation: Pre-oxygenation with 100% oxygen Induction Type: IV induction Ventilation: Mask ventilation without difficulty Laryngoscope Size: Mac and 4 Grade View: Grade II Tube type: Oral Tube size: 7.5 mm Number of attempts: 1 Airway Equipment and Method: Stylet and Oral airway Placement Confirmation: ETT inserted through vocal cords under direct vision, positive ETCO2 and breath sounds checked- equal and bilateral Secured at: 23 cm Tube secured with: Tape Dental Injury: Teeth and Oropharynx as per pre-operative assessment

## 2021-08-04 NOTE — Transfer of Care (Signed)
Immediate Anesthesia Transfer of Care Note  Patient: Jeremiah Allen  Procedure(s) Performed: OPEN PARTIAL HEPATECTOMY (Abdomen) STAGING LAPAROSCOPY WITH INTRAOPERATIVE ULTRASOUND (Abdomen)  Patient Location: PACU  Anesthesia Type:General  Level of Consciousness: awake, alert  and oriented  Airway & Oxygen Therapy: Patient Spontanous Breathing  Post-op Assessment: Report given to RN and Post -op Vital signs reviewed and stable  Post vital signs: Reviewed and stable  Last Vitals:  Vitals Value Taken Time  BP 115/70 08/04/21 1723  Temp    Pulse 72 08/04/21 1729  Resp 10 08/04/21 1729  SpO2 100 % 08/04/21 1729  Vitals shown include unvalidated device data.  Last Pain:  Vitals:   08/04/21 1153  TempSrc:   PainSc: 0-No pain         Complications: No notable events documented.

## 2021-08-04 NOTE — Anesthesia Preprocedure Evaluation (Addendum)
Anesthesia Evaluation  Patient identified by MRN, date of birth, ID band Patient awake    Reviewed: Allergy & Precautions, NPO status , Patient's Chart, lab work & pertinent test results  Airway Mallampati: I  TM Distance: >3 FB Neck ROM: Full    Dental no notable dental hx. (+) Teeth Intact, Dental Advisory Given   Pulmonary Current Smoker and Patient abstained from smoking.,    Pulmonary exam normal breath sounds clear to auscultation       Cardiovascular negative cardio ROS Normal cardiovascular exam Rhythm:Regular Rate:Normal     Neuro/Psych PSYCHIATRIC DISORDERS Anxiety Bipolar Disorder Peripheral neuropathy- ChemoRx induced  Neuromuscular disease    GI/Hepatic GERD  Medicated,Metastatic liver disease   Endo/Other  negative endocrine ROS  Renal/GU negative Renal ROS  negative genitourinary   Musculoskeletal negative musculoskeletal ROS (+)   Abdominal   Peds  Hematology  (+) Blood dyscrasia, anemia ,   Anesthesia Other Findings   Reproductive/Obstetrics                             Anesthesia Physical Anesthesia Plan  ASA: 3  Anesthesia Plan: General   Post-op Pain Management: Regional block* and Minimal or no pain anticipated   Induction: Intravenous  PONV Risk Score and Plan: 3 and Treatment may vary due to age or medical condition, Midazolam, Ondansetron and Dexamethasone  Airway Management Planned: Oral ETT  Additional Equipment: Arterial line and CVP  Intra-op Plan:   Post-operative Plan: Extubation in OR  Informed Consent: I have reviewed the patients History and Physical, chart, labs and discussed the procedure including the risks, benefits and alternatives for the proposed anesthesia with the patient or authorized representative who has indicated his/her understanding and acceptance.     Dental advisory given  Plan Discussed with: CRNA and  Anesthesiologist  Anesthesia Plan Comments:         Anesthesia Quick Evaluation

## 2021-08-05 ENCOUNTER — Encounter (HOSPITAL_COMMUNITY): Payer: Self-pay | Admitting: Surgery

## 2021-08-05 LAB — CBC
HCT: 32.5 % — ABNORMAL LOW (ref 39.0–52.0)
Hemoglobin: 10.9 g/dL — ABNORMAL LOW (ref 13.0–17.0)
MCH: 31.6 pg (ref 26.0–34.0)
MCHC: 33.5 g/dL (ref 30.0–36.0)
MCV: 94.2 fL (ref 80.0–100.0)
Platelets: 191 10*3/uL (ref 150–400)
RBC: 3.45 MIL/uL — ABNORMAL LOW (ref 4.22–5.81)
RDW: 14.6 % (ref 11.5–15.5)
WBC: 13 10*3/uL — ABNORMAL HIGH (ref 4.0–10.5)
nRBC: 0 % (ref 0.0–0.2)

## 2021-08-05 LAB — COMPREHENSIVE METABOLIC PANEL
ALT: 366 U/L — ABNORMAL HIGH (ref 0–44)
AST: 306 U/L — ABNORMAL HIGH (ref 15–41)
Albumin: 3.6 g/dL (ref 3.5–5.0)
Alkaline Phosphatase: 63 U/L (ref 38–126)
Anion gap: 10 (ref 5–15)
BUN: 8 mg/dL (ref 6–20)
CO2: 25 mmol/L (ref 22–32)
Calcium: 9.1 mg/dL (ref 8.9–10.3)
Chloride: 105 mmol/L (ref 98–111)
Creatinine, Ser: 0.77 mg/dL (ref 0.61–1.24)
GFR, Estimated: >60 mL/min (ref >60)
Glucose, Bld: 125 mg/dL — ABNORMAL HIGH (ref 70–99)
Potassium: 3.8 mmol/L (ref 3.5–5.1)
Sodium: 140 mmol/L (ref 135–145)
Total Bilirubin: 0.6 mg/dL (ref 0.3–1.2)
Total Protein: 6.4 g/dL — ABNORMAL LOW (ref 6.5–8.1)

## 2021-08-05 MED ORDER — SODIUM CHLORIDE 0.9% FLUSH
10.0000 mL | INTRAVENOUS | Status: DC | PRN
  Administered 2021-08-06: 10 mL

## 2021-08-05 MED ORDER — CHLORHEXIDINE GLUCONATE CLOTH 2 % EX PADS
6.0000 | MEDICATED_PAD | Freq: Every day | CUTANEOUS | Status: DC
  Administered 2021-08-05 – 2021-08-06 (×2): 6 via TOPICAL

## 2021-08-05 MED ORDER — METHOCARBAMOL 500 MG PO TABS
1000.0000 mg | ORAL_TABLET | Freq: Three times a day (TID) | ORAL | Status: DC
  Administered 2021-08-05 – 2021-08-07 (×9): 1000 mg via ORAL
  Filled 2021-08-05 (×9): qty 2

## 2021-08-05 NOTE — Progress Notes (Signed)
    1 Day Post-Op  Subjective: No acute issues overnight. Ambulated in hallways already. Tolerating clear liquids, denies nausea/vomiting.    Objective: Vital signs in last 24 hours: Temp:  [97.8 F (36.6 C)-98.6 F (37 C)] 97.8 F (36.6 C) (07/04 0513) Pulse Rate:  [62-93] 74 (07/04 0513) Resp:  [11-27] 18 (07/04 0513) BP: (102-135)/(59-83) 134/76 (07/04 0513) SpO2:  [94 %-100 %] 98 % (07/04 0513) Arterial Line BP: (161-163)/(75) 163/75 (07/03 1815) Weight:  [70.3 kg] 70.3 kg (07/03 1130)    Intake/Output from previous day: 07/03 0701 - 07/04 0700 In: 850 [I.V.:600; IV Piggyback:250] Out: 1425 [Urine:1275; Blood:150] Intake/Output this shift: Total I/O In: -  Out: 1150 [Urine:1150]  PE: General: resting comfortably, NAD Neuro: alert and oriented, no focal deficits HEENT: CVC in place left neck Resp: normal work of breathing Abdomen: soft, nondistended, appropriately tender. R subcostal incision is clean and dry with no erythema or induration. Extremities: warm and well-perfused GU: foley draining clear yellow urine   Lab Results:  No results for input(s): "WBC", "HGB", "HCT", "PLT" in the last 72 hours. BMET No results for input(s): "NA", "K", "CL", "CO2", "GLUCOSE", "BUN", "CREATININE", "CALCIUM" in the last 72 hours. PT/INR No results for input(s): "LABPROT", "INR" in the last 72 hours. CMP     Component Value Date/Time   NA 137 07/08/2021 0815   K 4.1 07/08/2021 0815   CL 102 07/08/2021 0815   CO2 26 07/08/2021 0815   GLUCOSE 88 07/08/2021 0815   BUN 11 07/08/2021 0815   CREATININE 0.87 07/08/2021 0815   CALCIUM 9.7 07/08/2021 0815   PROT 7.6 07/08/2021 0815   ALBUMIN 4.3 07/08/2021 0815   AST 21 07/08/2021 0815   ALT 36 07/08/2021 0815   ALKPHOS 122 07/08/2021 0815   BILITOT 0.3 07/08/2021 0815   GFRNONAA >60 07/08/2021 0815   Lipase     Component Value Date/Time   LIPASE 23 04/16/2021 0544       Studies/Results: No results  found.     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 - Advance to regular diet - IVF to 50 ml/hr, SLIV when tolerating PO - Remove foley and central line (has peripheral access) - Multimodal pain control: prn oxycodone and morphine, scheduled tylenol, toradol and robaxin. - Labs pending this morning - Ambulate TID, pulmonary toilet - VTE: lovenox, SCDs  - Dispo: inpatient, med-surg floor    LOS: 1 day    Michaelle Birks, MD [facility] Surgery General, Hepatobiliary and Pancreatic Surgery 08/05/21 6:55 AM

## 2021-08-05 NOTE — Progress Notes (Signed)
Unable to collect labs from the L IJ, both lumens does not give blood return. RN aware.

## 2021-08-06 ENCOUNTER — Inpatient Hospital Stay: Payer: Self-pay

## 2021-08-06 ENCOUNTER — Inpatient Hospital Stay: Payer: Self-pay | Admitting: Nurse Practitioner

## 2021-08-06 LAB — SURGICAL PATHOLOGY

## 2021-08-06 MED ORDER — MUPIROCIN 2 % EX OINT
TOPICAL_OINTMENT | Freq: Two times a day (BID) | CUTANEOUS | Status: DC
  Filled 2021-08-06: qty 22

## 2021-08-06 MED ORDER — OXYCODONE HCL 5 MG PO TABS
10.0000 mg | ORAL_TABLET | ORAL | Status: DC | PRN
  Administered 2021-08-06 – 2021-08-07 (×3): 15 mg via ORAL
  Filled 2021-08-06 (×5): qty 3

## 2021-08-06 MED ORDER — SODIUM CHLORIDE 0.9 % IV SOLN: INTRAVENOUS | Status: DC | PRN

## 2021-08-06 NOTE — Anesthesia Postprocedure Evaluation (Signed)
Anesthesia Post Note  Patient: Jeremiah Allen  Procedure(s) Performed: OPEN PARTIAL HEPATECTOMY (Abdomen) STAGING LAPAROSCOPY WITH INTRAOPERATIVE ULTRASOUND (Abdomen)     Patient location during evaluation: PACU Anesthesia Type: General and Regional Level of consciousness: awake Pain management: pain level controlled Vital Signs Assessment: post-procedure vital signs reviewed and stable Respiratory status: spontaneous breathing, nonlabored ventilation, respiratory function stable and patient connected to nasal cannula oxygen Cardiovascular status: blood pressure returned to baseline and stable Postop Assessment: no apparent nausea or vomiting Anesthetic complications: no   No notable events documented.  Last Vitals:  Vitals:   08/05/21 2042 08/06/21 0501  BP: 125/74 132/81  Pulse: 76 81  Resp: 17 16  Temp: 37 C 37.2 C  SpO2: 99% 95%    Last Pain:  Vitals:   08/06/21 0553  TempSrc:   PainSc: 8                  Aamira Bischoff P Lakashia Collison

## 2021-08-06 NOTE — Discharge Instructions (Signed)
PER MEDICAL ONCOLOGY: Follow up as scheduled with Dr. Benay Spice on 08/21/21 at 11:00  [place]  Activity No heavy lifting greater than 15 pounds for 6 weeks after surgery. Ok to shower in 24 hours, but do not bathe or submerge incisions underwater. Do not drive while taking narcotic pain medication.  Medications Take Tylenol and ibuprofen to help with pain - it is best to alternate these medications. Do NOT take any more than 3000 mg of Tylenol in a single 24-hour period. You have been prescribed a muscle relaxer (Robaxin) and oxycodone for more severe pain. Reserve the oxycodone for the most severe pain that is not covered by other medications. Take stool softeners and Miralax to help prevent constipation.  Wound Care Your incision is covered with skin glue called Dermabond. This will peel off on its own over time. You may shower and allow warm soapy water to run over your incisions. Gently pat dry. Do not swim, bathe or submerge your incision underwater. Monitor your incision for any new redness, tenderness, or drainage.  When to Call us: Fever greater than 100.5 New redness, drainage, or swelling at incision site Severe pain, nausea, or vomiting Jaundice (yellowing of the whites of the eyes or skin)  Follow-up You have an appointment scheduled with Dr. Zenia Resides on August 27, 2021 at 9:20am. This will be at the [facility] at [address], [place], [place], [place]. Please arrive at least 15 minutes prior to your scheduled appointment time.  For questions or concerns, please call the office at [phone].

## 2021-08-06 NOTE — Progress Notes (Signed)
    2 Days Post-Op  Subjective: No acute issues overnight. Tolerating regular diet. Having more difficulty with pain control, feels that oxycodone is not helping. Ambulated 4 laps in hall yesterday.    Objective: Vital signs in last 24 hours: Temp:  [98.1 F (36.7 C)-99 F (37.2 C)] 99 F (37.2 C) (07/05 0501) Pulse Rate:  [66-81] 81 (07/05 0501) Resp:  [16-17] 16 (07/05 0501) BP: (122-137)/(74-81) 132/81 (07/05 0501) SpO2:  [95 %-100 %] 95 % (07/05 0501)    Intake/Output from previous day: 07/04 0701 - 07/05 0700 In: 586.4 [I.V.:586.4] Out: 1350 [Urine:1350] Intake/Output this shift: No intake/output data recorded.  PE: General: resting comfortably, NAD Neuro: alert and oriented, no focal deficits HEENT: prior CVC site left neck is clean and dry with no hematoma. Resp: normal work of breathing on room air Abdomen: soft, nondistended, appropriately tender. R subcostal incision is clean and dry with no erythema or induration. Extremities: warm and well-perfused   Lab Results:  Recent Labs    08/05/21 0838  WBC 13.0*  HGB 10.9*  HCT 32.5*  PLT 191   BMET Recent Labs    08/05/21 0838  NA 140  K 3.8  CL 105  CO2 25  GLUCOSE 125*  BUN 8  CREATININE 0.77  CALCIUM 9.1   PT/INR No results for input(s): "LABPROT", "INR" in the last 72 hours. CMP     Component Value Date/Time   NA 140 08/05/2021 0838   K 3.8 08/05/2021 0838   CL 105 08/05/2021 0838   CO2 25 08/05/2021 0838   GLUCOSE 125 (H) 08/05/2021 0838   BUN 8 08/05/2021 0838   CREATININE 0.77 08/05/2021 0838   CREATININE 0.87 07/08/2021 0815   CALCIUM 9.1 08/05/2021 0838   PROT 6.4 (L) 08/05/2021 0838   ALBUMIN 3.6 08/05/2021 0838   AST 306 (H) 08/05/2021 0838   AST 21 07/08/2021 0815   ALT 366 (H) 08/05/2021 0838   ALT 36 07/08/2021 0815   ALKPHOS 63 08/05/2021 0838   BILITOT 0.6 08/05/2021 0838   BILITOT 0.3 07/08/2021 0815   GFRNONAA >60 08/05/2021 0838   GFRNONAA >60 07/08/2021 0815    Lipase     Component Value Date/Time   LIPASE 23 04/16/2021 0544       Studies/Results: No results found.     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 - Continue regular diet, SLIV - Multimodal pain control: prn oxycodone and morphine, scheduled tylenol, toradol and robaxin. Increase oxycodone dose today. - Ambulate TID, pulmonary toilet - VTE: lovenox, SCDs  - Dispo: inpatient, med-surg floor. Possible discharge home tomorrow if pain control improved.    LOS: 2 days    Michaelle Birks, MD [facility] Surgery General, Hepatobiliary and Pancreatic Surgery 08/06/21 6:51 AM

## 2021-08-07 ENCOUNTER — Other Ambulatory Visit (HOSPITAL_BASED_OUTPATIENT_CLINIC_OR_DEPARTMENT_OTHER): Payer: Self-pay

## 2021-08-07 ENCOUNTER — Encounter: Payer: Self-pay | Admitting: Oncology

## 2021-08-07 MED ORDER — POLYETHYLENE GLYCOL 3350 17 G PO PACK: 17.0000 g | PACK | Freq: Every day | ORAL | 0 refills | Status: DC | PRN

## 2021-08-07 MED ORDER — POLYETHYLENE GLYCOL 3350 17 G PO PACK
17.0000 g | PACK | Freq: Every day | ORAL | Status: DC
Start: 2021-08-07 — End: 2021-08-07

## 2021-08-07 MED ORDER — METHOCARBAMOL 500 MG PO TABS
1000.0000 mg | ORAL_TABLET | Freq: Four times a day (QID) | ORAL | 0 refills | Status: DC | PRN
  Filled 2021-08-07: qty 40, 5d supply, fill #0

## 2021-08-07 MED ORDER — OXYCODONE HCL 10 MG PO TABS
10.0000 mg | ORAL_TABLET | ORAL | 0 refills | Status: AC | PRN
  Filled 2021-08-07: qty 30, 4d supply, fill #0

## 2021-08-07 MED ORDER — DOCUSATE SODIUM 100 MG PO CAPS
100.0000 mg | ORAL_CAPSULE | Freq: Two times a day (BID) | ORAL | 0 refills | Status: DC
  Filled 2021-08-07: qty 28, 14d supply, fill #0

## 2021-08-07 MED ORDER — HEPARIN SOD (PORK) LOCK FLUSH 100 UNIT/ML IV SOLN
500.0000 [IU] | INTRAVENOUS | Status: AC | PRN
  Administered 2021-08-07: 500 [IU]

## 2021-08-07 NOTE — Progress Notes (Signed)
Patient ID: Jeremiah Allen, male   DOB: [birth year], 36 y.o.   MRN: [id number]   An After Visit Summary was printed and given to the patient.  Patient education given on medication changes, follow up appointments, surgical site care and the patient expresses understanding and acceptance of instructions.   Wife to drive hom   Haydee Salter 08/07/2021 11:06 AM

## 2021-08-07 NOTE — Discharge Summary (Addendum)
Physician Discharge Summary   Patient ID: Jeremiah Allen [id number] 36 y.o. 01-31-1986  Admit date: 08/04/2021  Discharge date and time: 08/07/2021  Admitting Physician: Dwan Bolt, MD   Discharge Physician: Michaelle Birks, MD  Admission Diagnoses: Rectal cancer [facility]) [C20] Rectal cancer metastasized to liver [facility]) [C20, C78.7]  Discharge Diagnoses: Rectal cancer with metastatic disease to the liver  Admission Condition: good  Discharged Condition: good  Indication for Admission: Jeremiah Allen is a 36 yo male who presented with rectal bleeding and was found to have rectal adenocarcinoma. Staging workup showed three metastatic tumors in the right lobe of the liver. He has completed 6 cycles neoadjuvant FOLFOX with downsizing of all liver tumors as well as the primary. After a multidisciplinary discussion, we have agreed to proceed with resection of his liver disease.   Hospital Course: The patient was taken to the OR on 08/04/21 for a staging laparoscopy, open intraoperative ultrasound and hepatic metastasectomy. Please see separately dictated operative note for further details of this portion of the procedure. Postoperatively the patient was admitted to the med-surg floor in stable condition. He was started on a clear liquid diet, which he tolerated with no nausea or vomiting. His foley was removed on POD1 and he was able to void spontaneously. He was advanced to a regular diet. Over the next few days he was able to progress with ambulating independently. Pain was controlled with multimodal therapy. On the morning of POD3, he was ambulating, tolerating a diet, passing flatus, and pain was adequately controlled. He was examined and deemed appropriate for discharge home. Surgical pathology results were reviewed with the patient and his wife prior to discharge.  Consults: None  Significant Diagnostic Studies: Surgical pathology: A. LIVER, SEGMENT 6 LESION, RESECTION:  - Focal subcapsular area  of fibrosis with scattered bile ducts, margins  negative.   Note:  There is no evidence of residual metastatic colorectal carcinoma present  in the specimen.  The area of interest shows fibrosis with scattered  bile ducts.  This area most likely represents complete response to the  patient's colorectal carcinoma.   B. LIVER, SEGMENT 8 LESION, RESECTION:  -  Focal subcapsular fibrosis with calcifications, no viable/definitive  residual colorectal carcinoma is identified, margins negative.   Note:  This area most likely represents an area of previously known metastatic  colorectal carcinoma.   C. LIVER, SEGMENT 7 LESION, RESECTION:  -  Predominantly pools of mucin and associated calcifications with rare  associated atypical cells (no definitively viable tumor cells)  consistent with treated colorectal carcinoma with partial to complete  response.   Note:  There is extensive thermal/mechanical artifact associated with the area  of interest with focal ink on mucin on a distorted specimen, cannot  fully exclude a possible positive margin (interpreted as negative).   Treatments: analgesia: acetaminophen, Morphine, toradol and robaxin; and surgery: Staging laparoscopy, open hepatic wedge metastasectomy, intraoperative liver ultrasound  Discharge Exam: General: resting comfortably, NAD Neuro: alert and oriented, no focal deficits Resp: normal work of breathing on room air Abdomen: soft, nondistended, nontender to palpation. R subcostal incision is clean and dry with no erythema or induration. Extremities: warm and well-perfused   Disposition: Discharge disposition: 01-Home or Self Care       Patient Instructions:  Allergies as of 08/07/2021       Reactions   Crab Extract Allergy Skin Test Anaphylaxis, Swelling   Throat swelling (CRAB ALLERGY ONLY)   Dilaudid [hydromorphone] Itching, Other (See Comments)  Skin tingling sensations         Medication List     STOP  taking these medications    sulfamethoxazole-trimethoprim 800-160 MG tablet Commonly known as: BACTRIM DS   traMADol 50 MG tablet Commonly known as: ULTRAM       TAKE these medications    acetaminophen 500 MG tablet Commonly known as: TYLENOL Take 500-1,000 mg by mouth every 6 (six) hours as needed for mild pain or moderate pain.   albuterol 108 (90 Base) MCG/ACT inhaler Commonly known as: VENTOLIN HFA Inhale 1-2 puffs into the lungs every 6 (six) hours as needed for wheezing or shortness of breath.   ARIPiprazole 5 MG tablet Commonly known as: ABILIFY Take 5 mg by mouth at bedtime.   docusate sodium 100 MG capsule Commonly known as: COLACE Take 1 capsule (100 mg total) by mouth 2 (two) times daily.   famotidine 20 MG tablet Commonly known as: PEPCID Take 20 mg by mouth 2 (two) times daily.   gabapentin 600 MG tablet Commonly known as: NEURONTIN Take 600 mg by mouth 3 (three) times daily.   hydrOXYzine 25 MG tablet Commonly known as: ATARAX Take 25-50 mg by mouth 3 (three) times daily as needed. 1 am 2 pm   hydrOXYzine 10 MG tablet Commonly known as: ATARAX Take 2.5 tablets (25 mg total) by mouth 3 (three) times daily as needed for anxiety.   lidocaine-prilocaine cream Commonly known as: EMLA Apply 1 application. topically as needed.   methocarbamol 500 MG tablet Commonly known as: ROBAXIN Take 2 tablets (1,000 mg total) by mouth every 6 (six) hours as needed for muscle spasms.   mupirocin ointment 2 % Commonly known as: BACTROBAN Apply to the right leg three times a day for 5 days   ondansetron 8 MG tablet Commonly known as: ZOFRAN Take 1 tablet (8 mg total) by mouth every 8 (eight) hours as needed for nausea or vomiting (May start using after day 3 for nausea and vomiting).   Oxycodone HCl 10 MG Tabs Take 1-1.5 tablets (10-15 mg total) by mouth every 4 (four) hours as needed for up to 7 days ('10mg'$  for moderate pain, '15mg'$  for severe pain).    polyethylene glycol 17 g packet Commonly known as: MIRALAX / GLYCOLAX Take 17 g by mouth daily as needed for moderate constipation.   prochlorperazine 10 MG tablet Commonly known as: COMPAZINE Take 1 tablet (10 mg total) by mouth every 6 (six) hours as needed for nausea or vomiting.       Activity: ambulate in house, no driving while on analgesics, and no heavy lifting for 6 weeks Diet: regular diet Wound Care: keep wound clean and dry  Follow-up with Dr. Zenia Resides on August 27, 2021.  Signed: Dwan Bolt 08/07/2021 9:34 AM

## 2021-08-07 NOTE — Plan of Care (Signed)
Patient ID: Jeremiah Allen, male   DOB: 1986-01-19, 36 y.o.   MRN: [id number]  Problem: Education: Goal: Knowledge of General Education information will improve Description: Including pain rating scale, medication(s)/side effects and non-pharmacologic comfort measures Outcome: Adequate for Discharge   Problem: Health Behavior/Discharge Planning: Goal: Ability to manage health-related needs will improve Outcome: Adequate for Discharge   Problem: Clinical Measurements: Goal: Ability to maintain clinical measurements within normal limits will improve Outcome: Adequate for Discharge Goal: Will remain free from infection Outcome: Adequate for Discharge Goal: Diagnostic test results will improve Outcome: Adequate for Discharge Goal: Respiratory complications will improve Outcome: Adequate for Discharge Goal: Cardiovascular complication will be avoided Outcome: Adequate for Discharge   Problem: Activity: Goal: Risk for activity intolerance will decrease Outcome: Adequate for Discharge   Problem: Nutrition: Goal: Adequate nutrition will be maintained Outcome: Adequate for Discharge   Problem: Coping: Goal: Level of anxiety will decrease Outcome: Adequate for Discharge   Problem: Elimination: Goal: Will not experience complications related to bowel motility Outcome: Adequate for Discharge Goal: Will not experience complications related to urinary retention Outcome: Adequate for Discharge   Problem: Pain Managment: Goal: General experience of comfort will improve Outcome: Adequate for Discharge   Problem: Safety: Goal: Ability to remain free from injury will improve Outcome: Adequate for Discharge   Problem: Skin Integrity: Goal: Risk for impaired skin integrity will decrease Outcome: Adequate for Discharge    Haydee Salter, RN

## 2021-08-08 ENCOUNTER — Inpatient Hospital Stay: Payer: Self-pay

## 2021-08-08 LAB — TYPE AND SCREEN
ABO/RH(D): O POS
Antibody Screen: NEGATIVE
Unit division: 0
Unit division: 0
Unit division: 0
Unit division: 0

## 2021-08-08 LAB — BPAM RBC
Blood Product Expiration Date: 202308012359
Blood Product Expiration Date: 202308012359
Blood Product Expiration Date: 202308032359
Blood Product Expiration Date: 202308032359
Unit Type and Rh: 5100
Unit Type and Rh: 5100
Unit Type and Rh: 5100
Unit Type and Rh: 5100

## 2021-08-11 ENCOUNTER — Telehealth: Payer: Self-pay | Admitting: *Deleted

## 2021-08-11 NOTE — Telephone Encounter (Signed)
Attempted to reach Jeremiah Allen to f/u on status since hospital d/c. Left message to call with any questions or concerns, otherwise f/u on 08/21/21 at 11:00

## 2021-08-14 ENCOUNTER — Encounter: Payer: Self-pay | Admitting: Oncology

## 2021-08-21 ENCOUNTER — Other Ambulatory Visit: Payer: Self-pay

## 2021-08-21 ENCOUNTER — Inpatient Hospital Stay: Payer: Medicaid Other | Attending: Oncology | Admitting: Oncology

## 2021-08-21 ENCOUNTER — Encounter: Payer: Self-pay | Admitting: *Deleted

## 2021-08-21 ENCOUNTER — Other Ambulatory Visit (HOSPITAL_BASED_OUTPATIENT_CLINIC_OR_DEPARTMENT_OTHER): Payer: Self-pay

## 2021-08-21 ENCOUNTER — Encounter: Payer: Self-pay | Admitting: Oncology

## 2021-08-21 VITALS — BP 119/67 | HR 79 | Temp 98.1°F | Resp 18 | Wt 161.4 lb

## 2021-08-21 DIAGNOSIS — C20 Malignant neoplasm of rectum: Secondary | ICD-10-CM | POA: Diagnosis not present

## 2021-08-21 DIAGNOSIS — C787 Secondary malignant neoplasm of liver and intrahepatic bile duct: Secondary | ICD-10-CM | POA: Insufficient documentation

## 2021-08-21 MED ORDER — METHOCARBAMOL 500 MG PO TABS
1000.0000 mg | ORAL_TABLET | Freq: Four times a day (QID) | ORAL | 0 refills | Status: DC | PRN
  Filled 2021-08-21: qty 60, 8d supply, fill #0

## 2021-08-21 MED ORDER — HYDROXYZINE HCL 10 MG PO TABS
25.0000 mg | ORAL_TABLET | Freq: Three times a day (TID) | ORAL | 0 refills | Status: DC | PRN
  Filled 2021-08-21: qty 30, 4d supply, fill #0

## 2021-08-21 MED ORDER — GABAPENTIN 600 MG PO TABS
600.0000 mg | ORAL_TABLET | Freq: Three times a day (TID) | ORAL | 0 refills | Status: DC
  Filled 2021-08-21: qty 90, 30d supply, fill #0

## 2021-08-21 NOTE — Progress Notes (Signed)
Added to GI conference for Full discussion for 08/27/21

## 2021-08-21 NOTE — Progress Notes (Signed)
[facility] OFFICE PROGRESS NOTE   Diagnosis: Rectal cancer  INTERVAL HISTORY:   Mr. Rathe completed cycle 6 FOLFOX 07/08/2021.  No nausea or diarrhea following chemotherapy.  He had cold sensitivity following chemotherapy.  No neuropathy symptoms at present.  No rectal pain. He underwent a laparoscopy, intraoperative liver ultrasound, and hepatic wedge metastectomy x3 on 08/04/2021.  Segment 6, 7, and 8 lesions were removed.  3 approximate 1 cm lesions were identified in the right liver.  He was discharged home 08/07/2021.  He continues to have soreness in the right abdomen.  The pathology revealed no residual carcinoma in the segment 6 lesion with fibrosis consistent with a complete response to therapy.  The segment 8 lesion also revealed no residual carcinoma with negative margins. The segment 7 lesion had predominant pools of mucin and associated calcifications with rare atypical cells consistent with treated colorectal carcinoma with a partial to complete response.  Extensive thermal/mechanical artifact was associated with focal ink on mucin and the distorted specimen, a possible positive margin not excluded.  Margin interpreted as negative. Objective:  Vital signs in last 24 hours:  Blood pressure 119/67, pulse 79, temperature 98.1 F (36.7 C), temperature source Oral, resp. rate 18, weight 161 lb 6.4 oz (73.2 kg), SpO2 100 %.    Resp: Lungs clear bilaterally Cardio: Regular rate and rhythm GI: No hepatosplenomegaly, healed incision in the right upper abdomen with associated tenderness, no evidence of infection Vascular: No leg edema Neuro: Mild loss of vibratory sense at the fingertips bilaterally    Portacath/PICC-without erythema  Lab Results:  Lab Results  Component Value Date   WBC 13.0 (H) 08/05/2021   HGB 10.9 (L) 08/05/2021   HCT 32.5 (L) 08/05/2021   MCV 94.2 08/05/2021   PLT 191 08/05/2021   NEUTROABS 6.9 07/08/2021    CMP  Lab Results   Component Value Date   NA 140 08/05/2021   K 3.8 08/05/2021   CL 105 08/05/2021   CO2 25 08/05/2021   GLUCOSE 125 (H) 08/05/2021   BUN 8 08/05/2021   CREATININE 0.77 08/05/2021   CALCIUM 9.1 08/05/2021   PROT 6.4 (L) 08/05/2021   ALBUMIN 3.6 08/05/2021   AST 306 (H) 08/05/2021   ALT 366 (H) 08/05/2021   ALKPHOS 63 08/05/2021   BILITOT 0.6 08/05/2021   GFRNONAA >60 08/05/2021    Lab Results  Component Value Date   CEA1 14.5 (H) 04/17/2021   CEA 3.99 07/08/2021     Medications: I have reviewed the patient's current medications.   Assessment/Plan: Rectal cancer-clinical stage IV(T4aN2, M1) Sigmoidoscopy 04/16/2021-mass at 4 cm from the anal verge, invasive moderately differentiated adenocarcinoma with lymphovascular invasion OmniSeq-MSS, tumor mutation burden 3.9, PD-L1 less than 1%, K-ras G12 D CT angio abdomen/pelvis-bulky irregular rectal wall thickening with perirectal lymphadenopathy and 2 indeterminate liver lesions MRI liver 04/17/2021-3 discrete right liver metastases MRI pelvis 04/17/2021-T4aN2 rectal tumor-10 cm from the anal verge, involvement of left anterior peritoneal reflection, numerous mesorectal lymph nodes within the pelvis, no evidence of extra mesorectal adenopathy greater than 10 nodes in the mesorectum CT chest 04/18/2021-no evidence of metastatic disease Elevated CEA (14.5 on 04/17/2021) Cycle 1 FOLFOX 04/30/2021 Cycle 2 FOLFOX 05/14/2021 Cycle 3 FOLFOX 05/28/2021, Udenyca Cycle 4 FOLFOX 06/11/2021, prophylactic dexamethasone added for delayed nausea, Udenyca Cycle 5 FOLFOX 06/25/2021, Udenyca Cycle 6 FOLFOX 07/08/2021, Udenyca CTs 07/17/2021-decrease in size of hepatic lesions, decreased rectal wall thickening, decrease size of perirectal lymph nodes, lingular consolidation consistent with infectious/inflammatory process 08/04/2021-right liver  metastectomy procedures, segment 6, 7, 8 lesions removed.  The pathology from the segment 6 and segment 8 lesions revealed  no residual carcinoma, the segment 7 lesion revealed pools of mucin and associated calcifications with rare atypical cells-partial to complete response, extensive thermal/mechanical artifact with focal ink on mucin, margin interpreted as negative, though a positive margin could not be excluded Rectal bleeding and pain secondary to #1 Chronic left sciatica Bipolar disorder Right testicular fullness noted on exam 04/21/2021-epididymal cyst? 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 Skoda has a history of metastatic rectal cancer.  He has completed 6 cycles of FOLFOX chemotherapy.  A restaging CT revealed a decrease in the rectal mass, perirectal lymph nodes, and liver metastases.  He completed resection of 3 liver metastases 08/04/2021.  There was a near complete response on the liver pathology.  He appears well today.  The plan is to complete 2 more cycles of FOLFOX prior to concurrent Xeloda and radiation.  He will return for the next cycle of FOLFOX 08/27/2021.  He will be scheduled for an office visit and chemotherapy on 09/10/2021.  We refilled prescriptions for gabapentin, hydroxyzine, and methocarbamol today.  Betsy Coder, MD  08/21/2021  11:42 AM

## 2021-08-25 ENCOUNTER — Other Ambulatory Visit: Payer: Self-pay

## 2021-08-27 ENCOUNTER — Inpatient Hospital Stay: Payer: Medicaid Other

## 2021-08-27 ENCOUNTER — Inpatient Hospital Stay: Payer: Medicaid Other | Attending: Oncology

## 2021-08-27 ENCOUNTER — Other Ambulatory Visit: Payer: Self-pay

## 2021-08-27 VITALS — BP 114/74 | HR 70 | Temp 98.0°F | Resp 18 | Ht 69.0 in | Wt 167.8 lb

## 2021-08-27 DIAGNOSIS — C20 Malignant neoplasm of rectum: Secondary | ICD-10-CM

## 2021-08-27 DIAGNOSIS — C787 Secondary malignant neoplasm of liver and intrahepatic bile duct: Secondary | ICD-10-CM | POA: Insufficient documentation

## 2021-08-27 DIAGNOSIS — Z5189 Encounter for other specified aftercare: Secondary | ICD-10-CM | POA: Diagnosis not present

## 2021-08-27 DIAGNOSIS — Z5111 Encounter for antineoplastic chemotherapy: Secondary | ICD-10-CM | POA: Insufficient documentation

## 2021-08-27 LAB — CMP (CANCER CENTER ONLY)
ALT: 23 U/L (ref 0–44)
AST: 23 U/L (ref 15–41)
Albumin: 4.6 g/dL (ref 3.5–5.0)
Alkaline Phosphatase: 88 U/L (ref 38–126)
Anion gap: 10 (ref 5–15)
BUN: 8 mg/dL (ref 6–20)
CO2: 25 mmol/L (ref 22–32)
Calcium: 9.8 mg/dL (ref 8.9–10.3)
Chloride: 103 mmol/L (ref 98–111)
Creatinine: 0.81 mg/dL (ref 0.61–1.24)
GFR, Estimated: >60 mL/min (ref >60)
Glucose, Bld: 115 mg/dL — ABNORMAL HIGH (ref 70–99)
Potassium: 3.4 mmol/L — ABNORMAL LOW (ref 3.5–5.1)
Sodium: 138 mmol/L (ref 135–145)
Total Bilirubin: 0.3 mg/dL (ref 0.3–1.2)
Total Protein: 7.2 g/dL (ref 6.5–8.1)

## 2021-08-27 LAB — CBC WITH DIFFERENTIAL (CANCER CENTER ONLY)
Abs Immature Granulocytes: 0.01 10*3/uL (ref 0.00–0.07)
Basophils Absolute: 0 10*3/uL (ref 0.0–0.1)
Basophils Relative: 1 %
Eosinophils Absolute: 0.1 10*3/uL (ref 0.0–0.5)
Eosinophils Relative: 1 %
HCT: 37.5 % — ABNORMAL LOW (ref 39.0–52.0)
Hemoglobin: 12.3 g/dL — ABNORMAL LOW (ref 13.0–17.0)
Immature Granulocytes: 0 %
Lymphocytes Relative: 35 %
Lymphs Abs: 1.4 10*3/uL (ref 0.7–4.0)
MCH: 30.9 pg (ref 26.0–34.0)
MCHC: 32.8 g/dL (ref 30.0–36.0)
MCV: 94.2 fL (ref 80.0–100.0)
Monocytes Absolute: 0.3 10*3/uL (ref 0.1–1.0)
Monocytes Relative: 8 %
Neutro Abs: 2.3 10*3/uL (ref 1.7–7.7)
Neutrophils Relative %: 55 %
Platelet Count: 204 10*3/uL (ref 150–400)
RBC: 3.98 MIL/uL — ABNORMAL LOW (ref 4.22–5.81)
RDW: 12.9 % (ref 11.5–15.5)
WBC Count: 4.1 10*3/uL (ref 4.0–10.5)
nRBC: 0 % (ref 0.0–0.2)

## 2021-08-27 MED ORDER — OXALIPLATIN CHEMO INJECTION 100 MG/20ML
85.0000 mg/m2 | Freq: Once | INTRAVENOUS | Status: AC
  Administered 2021-08-27: 150 mg via INTRAVENOUS
  Filled 2021-08-27: qty 20

## 2021-08-27 MED ORDER — PALONOSETRON HCL INJECTION 0.25 MG/5ML
0.2500 mg | Freq: Once | INTRAVENOUS | Status: AC
  Administered 2021-08-27: 0.25 mg via INTRAVENOUS
  Filled 2021-08-27: qty 5

## 2021-08-27 MED ORDER — FLUOROURACIL CHEMO INJECTION 2.5 GM/50ML
400.0000 mg/m2 | Freq: Once | INTRAVENOUS | Status: AC
  Administered 2021-08-27: 700 mg via INTRAVENOUS
  Filled 2021-08-27: qty 14

## 2021-08-27 MED ORDER — SODIUM CHLORIDE 0.9 % IV SOLN
2400.0000 mg/m2 | INTRAVENOUS | Status: DC
  Administered 2021-08-27: 4250 mg via INTRAVENOUS
  Filled 2021-08-27: qty 85

## 2021-08-27 MED ORDER — LEUCOVORIN CALCIUM INJECTION 350 MG
400.0000 mg/m2 | Freq: Once | INTRAVENOUS | Status: AC
  Administered 2021-08-27: 708 mg via INTRAVENOUS
  Filled 2021-08-27: qty 35.4

## 2021-08-27 MED ORDER — DEXTROSE 5 % IV SOLN: Freq: Once | INTRAVENOUS | Status: AC

## 2021-08-27 MED ORDER — SODIUM CHLORIDE 0.9 % IV SOLN
10.0000 mg | Freq: Once | INTRAVENOUS | Status: AC
  Administered 2021-08-27: 10 mg via INTRAVENOUS
  Filled 2021-08-27: qty 1

## 2021-08-27 NOTE — Patient Instructions (Addendum)
[facility]   The chemotherapy medication bag should finish at 46 hours, 96 hours, or 7 days. For example, if your pump is scheduled for 46 hours and it was put on at 4:00 p.m., it should finish at 2:00 p.m. the day it is scheduled to come off regardless of your appointment time.     Estimated time to finish at 1:15 Friday, August 29, 2021.   If the display on your pump reads "Low Volume" and it is beeping, take the batteries out of the pump and come to the cancer center for it to be taken off.   If the pump alarms go off prior to the pump reading "Low Volume" then call [phone] and someone can assist you.  If the plunger comes out and the chemotherapy medication is leaking out, please use your home chemo spill kit to clean up the spill. Do NOT use paper towels or other household products.  If you have problems or questions regarding your pump, please call either [phone] (24 hours a day) or the cancer center Monday-Friday 8:00 a.m.- 4:30 p.m. at the clinic number and we will assist you. If you are unable to get assistance, then go to the nearest Emergency Department and ask the staff to contact the IV team for assistance.  Discharge Instructions: Thank you for choosing [facility] to provide your oncology and hematology care.   If you have a lab appointment with the [facility], please go directly to the [facility] and check in at the registration area.   Wear comfortable clothing and clothing appropriate for easy access to any Portacath or PICC line.   We strive to give you quality time with your provider. You may need to reschedule your appointment if you arrive late (15 or more minutes).  Arriving late affects you and other patients whose appointments are after yours.  Also, if you miss three or more appointments without notifying the office, you may be dismissed from the clinic at the provider's discretion.      For prescription  refill requests, have your pharmacy contact our office and allow 72 hours for refills to be completed.    Today you received the following chemotherapy and/or immunotherapy agents Oxaliplatin, Leucovorin, Fluorouracil.      To help prevent nausea and vomiting after your treatment, we encourage you to take your nausea medication as directed.  BELOW ARE SYMPTOMS THAT SHOULD BE REPORTED IMMEDIATELY: *FEVER GREATER THAN 100.4 F (38 C) OR HIGHER *CHILLS OR SWEATING *NAUSEA AND VOMITING THAT IS NOT CONTROLLED WITH YOUR NAUSEA MEDICATION *UNUSUAL SHORTNESS OF BREATH *UNUSUAL BRUISING OR BLEEDING *URINARY PROBLEMS (pain or burning when urinating, or frequent urination) *BOWEL PROBLEMS (unusual diarrhea, constipation, pain near the anus) TENDERNESS IN MOUTH AND THROAT WITH OR WITHOUT PRESENCE OF ULCERS (sore throat, sores in mouth, or a toothache) UNUSUAL RASH, SWELLING OR PAIN  UNUSUAL VAGINAL DISCHARGE OR ITCHING   Items with * indicate a potential emergency and should be followed up as soon as possible or go to the Emergency Department if any problems should occur.  Please show the CHEMOTHERAPY ALERT CARD or IMMUNOTHERAPY ALERT CARD at check-in to the Emergency Department and triage nurse.  Should you have questions after your visit or need to cancel or reschedule your appointment, please contact [facility]  Dept: [phone]  and follow the prompts.  Office hours are 8:00 a.m. to 4:30 p.m. Monday - Friday. Please note that voicemails  left after 4:00 p.m. may not be returned until the following business day.  We are closed weekends and major holidays. You have access to a nurse at all times for urgent questions. Please call the main number to the clinic Dept: [phone] and follow the prompts.   For any non-urgent questions, you may also contact your provider using MyChart. We now offer e-Visits for anyone 67 and older to request care online for non-urgent  symptoms. For details visit [email or web address].   Also download the MyChart app! Go to the app store, search "MyChart", open the app, select [facility], and log in with your MyChart username and password.  Masks are optional in the cancer centers. If you would like for your care team to wear a mask while they are taking care of you, please let them know. For doctor visits, patients may have with them one support person who is at least 34 years old. At this time, visitors are not allowed in the infusion area.  Oxaliplatin Injection What is this medication? OXALIPLATIN (ox AL i PLA tin) is a chemotherapy drug. It targets fast dividing cells, like cancer cells, and causes these cells to die. This medicine is used to treat cancers of the colon and rectum, and many other cancers. This medicine may be used for other purposes; ask your health care provider or pharmacist if you have questions. COMMON BRAND NAME(S): Eloxatin What should I tell my care team before I take this medication? They need to know if you have any of these conditions: heart disease history of irregular heartbeat liver disease low blood counts, like white cells, platelets, or red blood cells lung or breathing disease, like asthma take medicines that treat or prevent blood clots tingling of the fingers or toes, or other nerve disorder an unusual or allergic reaction to oxaliplatin, other chemotherapy, other medicines, foods, dyes, or preservatives pregnant or trying to get pregnant breast-feeding How should I use this medication? This drug is given as an infusion into a vein. It is administered in a hospital or clinic by a specially trained health care professional. Talk to your pediatrician regarding the use of this medicine in children. Special care may be needed. Overdosage: If you think you have taken too much of this medicine contact a poison control center or emergency room at once. NOTE: This medicine is only for  you. Do not share this medicine with others. What if I miss a dose? It is important not to miss a dose. Call your doctor or health care professional if you are unable to keep an appointment. What may interact with this medication? Do not take this medicine with any of the following medications: cisapride dronedarone pimozide thioridazine This medicine may also interact with the following medications: aspirin and aspirin-like medicines certain medicines that treat or prevent blood clots like warfarin, apixaban, dabigatran, and rivaroxaban cisplatin cyclosporine diuretics medicines for infection like acyclovir, adefovir, amphotericin B, bacitracin, cidofovir, foscarnet, ganciclovir, gentamicin, pentamidine, vancomycin NSAIDs, medicines for pain and inflammation, like ibuprofen or naproxen other medicines that prolong the QT interval (an abnormal heart rhythm) pamidronate zoledronic acid This list may not describe all possible interactions. Give your health care provider a list of all the medicines, herbs, non-prescription drugs, or dietary supplements you use. Also tell them if you smoke, drink alcohol, or use illegal drugs. Some items may interact with your medicine. What should I watch for while using this medication? Your condition will be monitored carefully while you are receiving this medicine.  You may need blood work done while you are taking this medicine. This medicine may make you feel generally unwell. This is not uncommon as chemotherapy can affect healthy cells as well as cancer cells. Report any side effects. Continue your course of treatment even though you feel ill unless your healthcare professional tells you to stop. This medicine can make you more sensitive to cold. Do not drink cold drinks or use ice. Cover exposed skin before coming in contact with cold temperatures or cold objects. When out in cold weather wear warm clothing and cover your mouth and nose to warm the air  that goes into your lungs. Tell your doctor if you get sensitive to the cold. Do not become pregnant while taking this medicine or for 9 months after stopping it. Women should inform their health care professional if they wish to become pregnant or think they might be pregnant. Men should not father a child while taking this medicine and for 6 months after stopping it. There is potential for serious side effects to an unborn child. Talk to your health care professional for more information. Do not breast-feed a child while taking this medicine or for 3 months after stopping it. This medicine has caused ovarian failure in some women. This medicine may make it more difficult to get pregnant. Talk to your health care professional if you are concerned about your fertility. This medicine has caused decreased sperm counts in some men. This may make it more difficult to father a child. Talk to your health care professional if you are concerned about your fertility. This medicine may increase your risk of getting an infection. Call your health care professional for advice if you get a fever, chills, or sore throat, or other symptoms of a cold or flu. Do not treat yourself. Try to avoid being around people who are sick. Avoid taking medicines that contain aspirin, acetaminophen, ibuprofen, naproxen, or ketoprofen unless instructed by your health care professional. These medicines may hide a fever. Be careful brushing or flossing your teeth or using a toothpick because you may get an infection or bleed more easily. If you have any dental work done, tell your dentist you are receiving this medicine. What side effects may I notice from receiving this medication? Side effects that you should report to your doctor or health care professional as soon as possible: allergic reactions like skin rash, itching or hives, swelling of the face, lips, or tongue breathing problems cough low blood counts - this medicine may  decrease the number of white blood cells, red blood cells, and platelets. You may be at increased risk for infections and bleeding nausea, vomiting pain, redness, or irritation at site where injected pain, tingling, numbness in the hands or feet signs and symptoms of bleeding such as bloody or black, tarry stools; red or dark brown urine; spitting up blood or brown material that looks like coffee grounds; red spots on the skin; unusual bruising or bleeding from the eyes, gums, or nose signs and symptoms of a dangerous change in heartbeat or heart rhythm like chest pain; dizziness; fast, irregular heartbeat; palpitations; feeling faint or lightheaded; falls signs and symptoms of infection like fever; chills; cough; sore throat; pain or trouble passing urine signs and symptoms of liver injury like dark yellow or brown urine; general ill feeling or flu-like symptoms; light-colored stools; loss of appetite; nausea; right upper belly pain; unusually weak or tired; yellowing of the eyes or skin signs and symptoms of low  red blood cells or anemia such as unusually weak or tired; feeling faint or lightheaded; falls signs and symptoms of muscle injury like dark urine; trouble passing urine or change in the amount of urine; unusually weak or tired; muscle pain; back pain Side effects that usually do not require medical attention (report to your doctor or health care professional if they continue or are bothersome): changes in taste diarrhea gas hair loss loss of appetite mouth sores This list may not describe all possible side effects. Call your doctor for medical advice about side effects. You may report side effects to FDA at 1-800-FDA-1088. Where should I keep my medication? This drug is given in a hospital or clinic and will not be stored at home. NOTE: This sheet is a summary. It may not cover all possible information. If you have questions about this medicine, talk to your doctor, pharmacist, or  health care provider.  2023 Elsevier/Gold Standard (2020-12-20 00:00:00)  Leucovorin injection What is this medication? LEUCOVORIN (loo koe VOR in) is used to prevent or treat the harmful effects of some medicines. This medicine is used to treat anemia caused by a low amount of folic acid in the body. It is also used with 5-fluorouracil (5-FU) to treat colon cancer. This medicine may be used for other purposes; ask your health care provider or pharmacist if you have questions. What should I tell my care team before I take this medication? They need to know if you have any of these conditions: anemia from low levels of vitamin B-12 in the blood an unusual or allergic reaction to leucovorin, folic acid, other medicines, foods, dyes, or preservatives pregnant or trying to get pregnant breast-feeding How should I use this medication? This medicine is for injection into a muscle or into a vein. It is given by a health care professional in a hospital or clinic setting. Talk to your pediatrician regarding the use of this medicine in children. Special care may be needed. Overdosage: If you think you have taken too much of this medicine contact a poison control center or emergency room at once. NOTE: This medicine is only for you. Do not share this medicine with others. What if I miss a dose? This does not apply. What may interact with this medication? capecitabine fluorouracil phenobarbital phenytoin primidone trimethoprim-sulfamethoxazole This list may not describe all possible interactions. Give your health care provider a list of all the medicines, herbs, non-prescription drugs, or dietary supplements you use. Also tell them if you smoke, drink alcohol, or use illegal drugs. Some items may interact with your medicine. What should I watch for while using this medication? Your condition will be monitored carefully while you are receiving this medicine. This medicine may increase the side  effects of 5-fluorouracil, 5-FU. Tell your doctor or health care professional if you have diarrhea or mouth sores that do not get better or that get worse. What side effects may I notice from receiving this medication? Side effects that you should report to your doctor or health care professional as soon as possible: allergic reactions like skin rash, itching or hives, swelling of the face, lips, or tongue breathing problems fever, infection mouth sores unusual bleeding or bruising unusually weak or tired Side effects that usually do not require medical attention (report to your doctor or health care professional if they continue or are bothersome): constipation or diarrhea loss of appetite nausea, vomiting This list may not describe all possible side effects. Call your doctor for medical advice  about side effects. You may report side effects to FDA at 1-800-FDA-1088. Where should I keep my medication? This drug is given in a hospital or clinic and will not be stored at home. NOTE: This sheet is a summary. It may not cover all possible information. If you have questions about this medicine, talk to your doctor, pharmacist, or health care provider.  2023 Elsevier/Gold Standard (2007-07-28 00:00:00)  Fluorouracil, 5-FU injection What is this medication? FLUOROURACIL, 5-FU (flure oh YOOR a sil) is a chemotherapy drug. It slows the growth of cancer cells. This medicine is used to treat many types of cancer like breast cancer, colon or rectal cancer, pancreatic cancer, and stomach cancer. This medicine may be used for other purposes; ask your health care provider or pharmacist if you have questions. COMMON BRAND NAME(S): Adrucil What should I tell my care team before I take this medication? They need to know if you have any of these conditions: blood disorders dihydropyrimidine dehydrogenase (DPD) deficiency infection (especially a virus infection such as chickenpox, cold sores, or  herpes) kidney disease liver disease malnourished, poor nutrition recent or ongoing radiation therapy an unusual or allergic reaction to fluorouracil, other chemotherapy, other medicines, foods, dyes, or preservatives pregnant or trying to get pregnant breast-feeding How should I use this medication? This drug is given as an infusion or injection into a vein. It is administered in a hospital or clinic by a specially trained health care professional. Talk to your pediatrician regarding the use of this medicine in children. Special care may be needed. Overdosage: If you think you have taken too much of this medicine contact a poison control center or emergency room at once. NOTE: This medicine is only for you. Do not share this medicine with others. What if I miss a dose? It is important not to miss your dose. Call your doctor or health care professional if you are unable to keep an appointment. What may interact with this medication? Do not take this medicine with any of the following medications: live virus vaccines This medicine may also interact with the following medications: medicines that treat or prevent blood clots like warfarin, enoxaparin, and dalteparin This list may not describe all possible interactions. Give your health care provider a list of all the medicines, herbs, non-prescription drugs, or dietary supplements you use. Also tell them if you smoke, drink alcohol, or use illegal drugs. Some items may interact with your medicine. What should I watch for while using this medication? Visit your doctor for checks on your progress. This drug may make you feel generally unwell. This is not uncommon, as chemotherapy can affect healthy cells as well as cancer cells. Report any side effects. Continue your course of treatment even though you feel ill unless your doctor tells you to stop. In some cases, you may be given additional medicines to help with side effects. Follow all directions  for their use. Call your doctor or health care professional for advice if you get a fever, chills or sore throat, or other symptoms of a cold or flu. Do not treat yourself. This drug decreases your body's ability to fight infections. Try to avoid being around people who are sick. This medicine may increase your risk to bruise or bleed. Call your doctor or health care professional if you notice any unusual bleeding. Be careful brushing and flossing your teeth or using a toothpick because you may get an infection or bleed more easily. If you have any dental work done, tell  your dentist you are receiving this medicine. Avoid taking products that contain aspirin, acetaminophen, ibuprofen, naproxen, or ketoprofen unless instructed by your doctor. These medicines may hide a fever. Do not become pregnant while taking this medicine. Women should inform their doctor if they wish to become pregnant or think they might be pregnant. There is a potential for serious side effects to an unborn child. Talk to your health care professional or pharmacist for more information. Do not breast-feed an infant while taking this medicine. Men should inform their doctor if they wish to father a child. This medicine may lower sperm counts. Do not treat diarrhea with over the counter products. Contact your doctor if you have diarrhea that lasts more than 2 days or if it is severe and watery. This medicine can make you more sensitive to the sun. Keep out of the sun. If you cannot avoid being in the sun, wear protective clothing and use sunscreen. Do not use sun lamps or tanning beds/booths. What side effects may I notice from receiving this medication? Side effects that you should report to your doctor or health care professional as soon as possible: allergic reactions like skin rash, itching or hives, swelling of the face, lips, or tongue low blood counts - this medicine may decrease the number of white blood cells, red blood cells  and platelets. You may be at increased risk for infections and bleeding. signs of infection - fever or chills, cough, sore throat, pain or difficulty passing urine signs of decreased platelets or bleeding - bruising, pinpoint red spots on the skin, black, tarry stools, blood in the urine signs of decreased red blood cells - unusually weak or tired, fainting spells, lightheadedness breathing problems changes in vision chest pain mouth sores nausea and vomiting pain, swelling, redness at site where injected pain, tingling, numbness in the hands or feet redness, swelling, or sores on hands or feet stomach pain unusual bleeding Side effects that usually do not require medical attention (report to your doctor or health care professional if they continue or are bothersome): changes in finger or toe nails diarrhea dry or itchy skin hair loss headache loss of appetite sensitivity of eyes to the light stomach upset unusually teary eyes This list may not describe all possible side effects. Call your doctor for medical advice about side effects. You may report side effects to FDA at 1-800-FDA-1088. Where should I keep my medication? This drug is given in a hospital or clinic and will not be stored at home. NOTE: This sheet is a summary. It may not cover all possible information. If you have questions about this medicine, talk to your doctor, pharmacist, or health care provider.  2023 Elsevier/Gold Standard (2020-12-20 00:00:00)  Hypokalemia Hypokalemia means that the amount of potassium in the blood is lower than normal. Potassium is a mineral (electrolyte) that helps regulate the amount of fluid in the body. It also stimulates muscle tightening (contraction) and helps nerves work properly. Normally, most of the body's potassium is inside cells, and only a very small amount is in the blood. Because the amount in the blood is so small, minor changes to potassium levels in the blood can be  life-threatening. What are the causes? This condition may be caused by: Antibiotic medicine. Diarrhea or vomiting. Taking too much of a medicine that helps you have a bowel movement (laxative) can cause diarrhea and lead to hypokalemia. Chronic kidney disease (CKD). Medicines that help the body get rid of excess fluid (diuretics). Eating  disorders, such as anorexia or bulimia. Low magnesium levels in the body. Sweating a lot. What are the signs or symptoms? Symptoms of this condition include: Weakness. Constipation. Fatigue. Muscle cramps. Mental confusion. Skipped heartbeats or irregular heartbeat (palpitations). Tingling or numbness. How is this diagnosed? This condition is diagnosed with a blood test. How is this treated? This condition may be treated by: Taking potassium supplements. Adjusting the medicines that you take. Eating more foods that contain a lot of potassium. If your potassium level is very low, you may need to get potassium through an IV and be monitored in the hospital. Follow these instructions at home: Eating and drinking  Eat a healthy diet. A healthy diet includes fresh fruits and vegetables, whole grains, healthy fats, and lean proteins. If told, eat more foods that contain a lot of potassium. These include: Nuts, such as peanuts and pistachios. Seeds, such as sunflower seeds and pumpkin seeds. Peas, lentils, and lima beans. Whole grain and bran cereals and breads. Fresh fruits and vegetables, such as apricots, avocado, bananas, cantaloupe, kiwi, oranges, tomatoes, asparagus, and potatoes. Juices, such as orange, tomato, and prune. Lean meats, including fish. Milk and milk products, such as yogurt. General instructions Take over-the-counter and prescription medicines only as told by your health care provider. This includes vitamins, natural food products, and supplements. Keep all follow-up visits. This is important. Contact a health care provider  if: You have weakness that gets worse. You feel your heart pounding or racing. You vomit. You have diarrhea. You have diabetes and you have trouble keeping your blood sugar in your target range. Get help right away if: You have chest pain. You have shortness of breath. You have vomiting or diarrhea that lasts for more than 2 days. You faint. These symptoms may be an emergency. Get help right away. Call 911. Do not wait to see if the symptoms will go away. Do not drive yourself to the hospital. Summary Hypokalemia means that the amount of potassium in the blood is lower than normal. This condition is diagnosed with a blood test. Hypokalemia may be treated by taking potassium supplements, adjusting the medicines that you take, or eating more foods that are high in potassium. If your potassium level is very low, you may need to get potassium through an IV and be monitored in the hospital. This information is not intended to replace advice given to you by your health care provider. Make sure you discuss any questions you have with your health care provider. Document Revised: 10/03/2020 Document Reviewed: 10/03/2020 Elsevier Patient Education  [place].

## 2021-08-27 NOTE — Progress Notes (Signed)
The proposed treatment discussed in conference is for discussion purpose only and is not a binding recommendation.  The patients have not been physically examined, or presented with their treatment options.  Therefore, final treatment plans cannot be decided.  

## 2021-08-29 ENCOUNTER — Inpatient Hospital Stay: Payer: Medicaid Other

## 2021-08-29 VITALS — BP 104/66 | HR 87 | Temp 98.4°F | Resp 18

## 2021-08-29 DIAGNOSIS — Z5111 Encounter for antineoplastic chemotherapy: Secondary | ICD-10-CM | POA: Diagnosis not present

## 2021-08-29 DIAGNOSIS — C20 Malignant neoplasm of rectum: Secondary | ICD-10-CM

## 2021-08-29 MED ORDER — SODIUM CHLORIDE 0.9% FLUSH
10.0000 mL | INTRAVENOUS | Status: DC | PRN
  Administered 2021-08-29: 10 mL

## 2021-08-29 MED ORDER — HEPARIN SOD (PORK) LOCK FLUSH 100 UNIT/ML IV SOLN
500.0000 [IU] | Freq: Once | INTRAVENOUS | Status: AC | PRN
  Administered 2021-08-29: 500 [IU]

## 2021-08-29 MED ORDER — PEGFILGRASTIM-CBQV 6 MG/0.6ML ~~LOC~~ SOSY
6.0000 mg | PREFILLED_SYRINGE | Freq: Once | SUBCUTANEOUS | Status: AC
  Administered 2021-08-29: 6 mg via SUBCUTANEOUS
  Filled 2021-08-29: qty 0.6

## 2021-08-29 NOTE — Patient Instructions (Signed)

## 2021-09-07 ENCOUNTER — Other Ambulatory Visit: Payer: Self-pay | Admitting: Oncology

## 2021-09-10 ENCOUNTER — Encounter: Payer: Self-pay | Admitting: *Deleted

## 2021-09-10 ENCOUNTER — Inpatient Hospital Stay: Payer: Medicaid Other

## 2021-09-10 ENCOUNTER — Telehealth: Payer: Self-pay | Admitting: Pharmacy Technician

## 2021-09-10 ENCOUNTER — Other Ambulatory Visit (HOSPITAL_BASED_OUTPATIENT_CLINIC_OR_DEPARTMENT_OTHER): Payer: Self-pay

## 2021-09-10 ENCOUNTER — Encounter: Payer: Self-pay | Admitting: Oncology

## 2021-09-10 ENCOUNTER — Telehealth: Payer: Self-pay | Admitting: Pharmacist

## 2021-09-10 ENCOUNTER — Inpatient Hospital Stay: Payer: Medicaid Other | Attending: Oncology | Admitting: Oncology

## 2021-09-10 ENCOUNTER — Other Ambulatory Visit (HOSPITAL_COMMUNITY): Payer: Self-pay

## 2021-09-10 ENCOUNTER — Other Ambulatory Visit: Payer: Self-pay | Admitting: *Deleted

## 2021-09-10 VITALS — BP 113/69 | HR 70 | Temp 98.1°F | Resp 18 | Ht 69.0 in | Wt 166.4 lb

## 2021-09-10 DIAGNOSIS — Z5189 Encounter for other specified aftercare: Secondary | ICD-10-CM | POA: Diagnosis not present

## 2021-09-10 DIAGNOSIS — C787 Secondary malignant neoplasm of liver and intrahepatic bile duct: Secondary | ICD-10-CM | POA: Insufficient documentation

## 2021-09-10 DIAGNOSIS — Z5111 Encounter for antineoplastic chemotherapy: Secondary | ICD-10-CM | POA: Insufficient documentation

## 2021-09-10 DIAGNOSIS — C20 Malignant neoplasm of rectum: Secondary | ICD-10-CM | POA: Diagnosis not present

## 2021-09-10 LAB — CBC WITH DIFFERENTIAL (CANCER CENTER ONLY)
Abs Immature Granulocytes: 0.04 10*3/uL (ref 0.00–0.07)
Basophils Absolute: 0 10*3/uL (ref 0.0–0.1)
Basophils Relative: 0 %
Eosinophils Absolute: 0 10*3/uL (ref 0.0–0.5)
Eosinophils Relative: 1 %
HCT: 39.3 % (ref 39.0–52.0)
Hemoglobin: 13.1 g/dL (ref 13.0–17.0)
Immature Granulocytes: 1 %
Lymphocytes Relative: 25 %
Lymphs Abs: 1.6 10*3/uL (ref 0.7–4.0)
MCH: 31.1 pg (ref 26.0–34.0)
MCHC: 33.3 g/dL (ref 30.0–36.0)
MCV: 93.3 fL (ref 80.0–100.0)
Monocytes Absolute: 0.4 10*3/uL (ref 0.1–1.0)
Monocytes Relative: 6 %
Neutro Abs: 4.3 10*3/uL (ref 1.7–7.7)
Neutrophils Relative %: 67 %
Platelet Count: 162 10*3/uL (ref 150–400)
RBC: 4.21 MIL/uL — ABNORMAL LOW (ref 4.22–5.81)
RDW: 13.5 % (ref 11.5–15.5)
WBC Count: 6.3 10*3/uL (ref 4.0–10.5)
nRBC: 0 % (ref 0.0–0.2)

## 2021-09-10 LAB — CMP (CANCER CENTER ONLY)
ALT: 40 U/L (ref 0–44)
AST: 22 U/L (ref 15–41)
Albumin: 4.6 g/dL (ref 3.5–5.0)
Alkaline Phosphatase: 105 U/L (ref 38–126)
Anion gap: 9 (ref 5–15)
BUN: 8 mg/dL (ref 6–20)
CO2: 24 mmol/L (ref 22–32)
Calcium: 9.1 mg/dL (ref 8.9–10.3)
Chloride: 105 mmol/L (ref 98–111)
Creatinine: 0.86 mg/dL (ref 0.61–1.24)
GFR, Estimated: >60 mL/min (ref >60)
Glucose, Bld: 113 mg/dL — ABNORMAL HIGH (ref 70–99)
Potassium: 3.9 mmol/L (ref 3.5–5.1)
Sodium: 138 mmol/L (ref 135–145)
Total Bilirubin: 0.3 mg/dL (ref 0.3–1.2)
Total Protein: 7.5 g/dL (ref 6.5–8.1)

## 2021-09-10 MED ORDER — SODIUM CHLORIDE 0.9 % IV SOLN
10.0000 mg | Freq: Once | INTRAVENOUS | Status: AC
  Administered 2021-09-10: 10 mg via INTRAVENOUS
  Filled 2021-09-10: qty 1

## 2021-09-10 MED ORDER — OXALIPLATIN CHEMO INJECTION 100 MG/20ML
85.0000 mg/m2 | Freq: Once | INTRAVENOUS | Status: AC
  Administered 2021-09-10: 150 mg via INTRAVENOUS
  Filled 2021-09-10: qty 20

## 2021-09-10 MED ORDER — FLUOROURACIL CHEMO INJECTION 2.5 GM/50ML
400.0000 mg/m2 | Freq: Once | INTRAVENOUS | Status: AC
  Administered 2021-09-10: 700 mg via INTRAVENOUS
  Filled 2021-09-10: qty 14

## 2021-09-10 MED ORDER — CAPECITABINE 500 MG PO TABS
1500.0000 mg | ORAL_TABLET | Freq: Two times a day (BID) | ORAL | 0 refills | Status: DC
  Filled 2021-09-10 – 2021-09-17 (×2): qty 168, 28d supply, fill #0

## 2021-09-10 MED ORDER — CAPECITABINE 500 MG PO TABS
ORAL_TABLET | ORAL | 0 refills | Status: DC
  Filled 2021-09-10: qty 168, fill #0

## 2021-09-10 MED ORDER — HYDROXYZINE HCL 25 MG PO TABS
25.0000 mg | ORAL_TABLET | Freq: Three times a day (TID) | ORAL | 0 refills | Status: DC | PRN
  Filled 2021-09-10: qty 60, 20d supply, fill #0

## 2021-09-10 MED ORDER — SODIUM CHLORIDE 0.9 % IV SOLN
2400.0000 mg/m2 | INTRAVENOUS | Status: DC
  Administered 2021-09-10: 4250 mg via INTRAVENOUS
  Filled 2021-09-10: qty 85

## 2021-09-10 MED ORDER — ONDANSETRON HCL 8 MG PO TABS
8.0000 mg | ORAL_TABLET | Freq: Three times a day (TID) | ORAL | 3 refills | Status: DC | PRN
Start: 2021-09-10 — End: 2022-03-08
  Filled 2021-09-10: qty 30, 10d supply, fill #0

## 2021-09-10 MED ORDER — PALONOSETRON HCL INJECTION 0.25 MG/5ML
0.2500 mg | Freq: Once | INTRAVENOUS | Status: AC
  Administered 2021-09-10: 0.25 mg via INTRAVENOUS

## 2021-09-10 MED ORDER — DEXTROSE 5 % IV SOLN: Freq: Once | INTRAVENOUS | Status: AC

## 2021-09-10 MED ORDER — LEUCOVORIN CALCIUM INJECTION 350 MG
400.0000 mg/m2 | Freq: Once | INTRAVENOUS | Status: AC
  Administered 2021-09-10: 708 mg via INTRAVENOUS
  Filled 2021-09-10: qty 17.5

## 2021-09-10 NOTE — Progress Notes (Signed)
[place] OFFICE PROGRESS NOTE   Diagnosis: Rectal cancer  INTERVAL HISTORY:   Mr Avina returns as scheduled.  He completed another cycle of FOLFOX on 08/27/2021.  He reports mild nausea following chemotherapy.  No mouth sores or diarrhea.  Cold sensitivity lasted for approximately 5 days.  He has intermittent numbness in the toes.  This does not interfere with activity.  No other neuropathy symptoms.  No rectal bleeding.   Objective:  Vital signs in last 24 hours:  Blood pressure 113/69, pulse 70, temperature 98.1 F (36.7 C), temperature source Oral, resp. rate 18, height _0  (1.753 m), weight 166 lb 6.4 oz (75.5 kg), SpO2 100 %.    HEENT: No thrush or ulcers Resp: Lungs clear bilaterally Cardio: Regular rate and rhythm GI: Nontender, no hepatosplenomegaly, no mass Vascular: No leg edema Neuro: Moderate loss of vibratory sense at the fingertips bilaterally    Portacath/PICC-without erythema  Lab Results:  Lab Results  Component Value Date   WBC 6.3 09/10/2021   HGB 13.1 09/10/2021   HCT 39.3 09/10/2021   MCV 93.3 09/10/2021   PLT 162 09/10/2021   NEUTROABS 4.3 09/10/2021    CMP  Lab Results  Component Value Date   NA 138 09/10/2021   K 3.9 09/10/2021   CL 105 09/10/2021   CO2 24 09/10/2021   GLUCOSE 113 (H) 09/10/2021   BUN 8 09/10/2021   CREATININE 0.86 09/10/2021   CALCIUM 9.1 09/10/2021   PROT 7.5 09/10/2021   ALBUMIN 4.6 09/10/2021   AST 22 09/10/2021   ALT 40 09/10/2021   ALKPHOS 105 09/10/2021   BILITOT 0.3 09/10/2021   GFRNONAA >60 09/10/2021    Lab Results  Component Value Date   CEA1 14.5 (H) 04/17/2021   CEA 3.99 07/08/2021    Medications: I have reviewed the patient's current medications.   Assessment/Plan: Rectal cancer-clinical stage IV(T4aN2, M1) Sigmoidoscopy 04/16/2021-mass at 4 cm from the anal verge, invasive moderately differentiated adenocarcinoma with lymphovascular invasion OmniSeq-MSS, tumor mutation  burden 3.9, PD-L1 less than 1%, K-ras G12 D CT angio abdomen/pelvis-bulky irregular rectal wall thickening with perirectal lymphadenopathy and 2 indeterminate liver lesions MRI liver 04/17/2021-3 discrete right liver metastases MRI pelvis 04/17/2021-T4aN2 rectal tumor-10 cm from the anal verge, involvement of left anterior peritoneal reflection, numerous mesorectal lymph nodes within the pelvis, no evidence of extra mesorectal adenopathy greater than 10 nodes in the mesorectum CT chest 04/18/2021-no evidence of metastatic disease Elevated CEA (14.5 on 04/17/2021) Cycle 1 FOLFOX 04/30/2021 Cycle 2 FOLFOX 05/14/2021 Cycle 3 FOLFOX 05/28/2021, Udenyca Cycle 4 FOLFOX 06/11/2021, prophylactic dexamethasone added for delayed nausea, Udenyca Cycle 5 FOLFOX 06/25/2021, Udenyca Cycle 6 FOLFOX 07/08/2021, Udenyca CTs 07/17/2021-decrease in size of hepatic lesions, decreased rectal wall thickening, decrease size of perirectal lymph nodes, lingular consolidation consistent with infectious/inflammatory process 08/04/2021-right liver metastectomy procedures, segment 6, 7, 8 lesions removed.  The pathology from the segment 6 and segment 8 lesions revealed no residual carcinoma, the segment 7 lesion revealed pools of mucin and associated calcifications with rare atypical cells-partial to complete response, extensive thermal/mechanical artifact with focal ink on mucin, margin interpreted as negative, though a positive margin could not be excluded Cycle 7 FOLFOX 08/27/2021 Cycle 8 FOLFOX 09/10/2021 Rectal bleeding and pain secondary to #1 Chronic left sciatica Bipolar disorder Right testicular fullness noted on exam 04/21/2021-epididymal cyst? 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  He has tolerated the chemotherapy well.  He has mild symptoms of oxaliplatin neuropathy.  He will complete a  final cycle of FOLFOX today.  He will be referred for radiation after this cycle of chemotherapy.  I anticipate a start date of 09/29/2021 for concurrent radiation and capecitabine.  We reviewed potential toxicities associated with capecitabine including the chance of mucositis, diarrhea, rash, hyperpigmentation, sun sensitivity, and hand/foot syndrome.  He agrees to proceed.  A prescription was entered for capecitabine.  He will return for an office and lab visit on 09/26/2021.  Betsy Coder, MD  09/10/2021  11:10 AM

## 2021-09-10 NOTE — Telephone Encounter (Signed)
Oral Oncology Pharmacist Encounter  Received new prescription for Xeloda (capecitabine) for the treatment of rectal cancer in conjunction with radiation, planned duration until the end of radiation. Anticipated start 09/29/21.  CMP from 09/10/21 assessed, no relevant lab abnormalities. Prescription dose and frequency assessed.   Current medication list in Epic reviewed, no relevant DDIs with capecitabine identified.  Evaluated chart and no patient barriers to medication adherence identified.   Prescription has been e-scribed to the [facility] for benefits analysis and approval.  Oral Oncology Clinic will continue to follow for insurance authorization, copayment issues, initial counseling and start date.  Patient agreed to treatment on 09/10/21 per MD documentation.  Darl Pikes, PharmD, BCPS, BCOP, CPP Hematology/Oncology Clinical Pharmacist Practitioner [place] [phone]  09/10/2021 1:47 PM

## 2021-09-10 NOTE — Patient Instructions (Signed)
[facility]   Discharge Instructions: Thank you for choosing [facility] to provide your oncology and hematology care.   If you have a lab appointment with the [facility], please go directly to the [facility] and check in at the registration area.   Wear comfortable clothing and clothing appropriate for easy access to any Portacath or PICC line.   We strive to give you quality time with your provider. You may need to reschedule your appointment if you arrive late (15 or more minutes).  Arriving late affects you and other patients whose appointments are after yours.  Also, if you miss three or more appointments without notifying the office, you may be dismissed from the clinic at the provider's discretion.      For prescription refill requests, have your pharmacy contact our office and allow 72 hours for refills to be completed.    Today you received the following chemotherapy and/or immunotherapy agents Oxaliplatin (ELOXATIN), Leucovorin & Flourouracil (ADRUCIL).      To help prevent nausea and vomiting after your treatment, we encourage you to take your nausea medication as directed.  BELOW ARE SYMPTOMS THAT SHOULD BE REPORTED IMMEDIATELY: *FEVER GREATER THAN 100.4 F (38 C) OR HIGHER *CHILLS OR SWEATING *NAUSEA AND VOMITING THAT IS NOT CONTROLLED WITH YOUR NAUSEA MEDICATION *UNUSUAL SHORTNESS OF BREATH *UNUSUAL BRUISING OR BLEEDING *URINARY PROBLEMS (pain or burning when urinating, or frequent urination) *BOWEL PROBLEMS (unusual diarrhea, constipation, pain near the anus) TENDERNESS IN MOUTH AND THROAT WITH OR WITHOUT PRESENCE OF ULCERS (sore throat, sores in mouth, or a toothache) UNUSUAL RASH, SWELLING OR PAIN  UNUSUAL VAGINAL DISCHARGE OR ITCHING   Items with * indicate a potential emergency and should be followed up as soon as possible or go to the Emergency Department if any problems should occur.  Please show the CHEMOTHERAPY ALERT  CARD or IMMUNOTHERAPY ALERT CARD at check-in to the Emergency Department and triage nurse.  Should you have questions after your visit or need to cancel or reschedule your appointment, please contact [facility]  Dept: [phone]  and follow the prompts.  Office hours are 8:00 a.m. to 4:30 p.m. Monday - Friday. Please note that voicemails left after 4:00 p.m. may not be returned until the following business day.  We are closed weekends and major holidays. You have access to a nurse at all times for urgent questions. Please call the main number to the clinic Dept: [phone] and follow the prompts.   For any non-urgent questions, you may also contact your provider using MyChart. We now offer e-Visits for anyone 68 and older to request care online for non-urgent symptoms. For details visit [email or web address].   Also download the MyChart app! Go to the app store, search "MyChart", open the app, select , and log in with your MyChart username and password.  Masks are optional in the cancer centers. If you would like for your care team to wear a mask while they are taking care of you, please let them know. You may have one support person who is at least 34 years old accompany you for your appointments.  Oxaliplatin Injection What is this medication? OXALIPLATIN (ox AL i PLA tin) treats some types of cancer. It works by slowing down the growth of cancer cells. This medicine may be used for other purposes; ask your health care provider or pharmacist if you have questions. COMMON BRAND NAME(S): Eloxatin What should I tell my  care team before I take this medication? They need to know if you have any of these conditions: Heart disease History of irregular heartbeat or rhythm Liver disease Low blood cell levels (white cells, red cells, and platelets) Lung or breathing disease, such as asthma Take medications that treat or prevent blood clots Tingling of the  fingers, toes, or other nerve disorder An unusual or allergic reaction to oxaliplatin, other medications, foods, dyes, or preservatives If you or your partner are pregnant or trying to get pregnant Breast-feeding How should I use this medication? This medication is injected into a vein. It is given by your care team in a hospital or clinic setting. Talk to your care team about the use of this medication in children. Special care may be needed. Overdosage: If you think you have taken too much of this medicine contact a poison control center or emergency room at once. NOTE: This medicine is only for you. Do not share this medicine with others. What if I miss a dose? Keep appointments for follow-up doses. It is important not to miss a dose. Call your care team if you are unable to keep an appointment. What may interact with this medication? Do not take this medication with any of the following: Cisapride Dronedarone Pimozide Thioridazine This medication may also interact with the following: Aspirin and aspirin-like medications Certain medications that treat or prevent blood clots, such as warfarin, apixaban, dabigatran, and rivaroxaban Cisplatin Cyclosporine Diuretics Medications for infection, such as acyclovir, adefovir, amphotericin B, bacitracin, cidofovir, foscarnet, ganciclovir, gentamicin, pentamidine, vancomycin NSAIDs, medications for pain and inflammation, such as ibuprofen or naproxen Other medications that cause heart rhythm changes Pamidronate Zoledronic acid This list may not describe all possible interactions. Give your health care provider a list of all the medicines, herbs, non-prescription drugs, or dietary supplements you use. Also tell them if you smoke, drink alcohol, or use illegal drugs. Some items may interact with your medicine. What should I watch for while using this medication? Your condition will be monitored carefully while you are receiving this  medication. You may need blood work while taking this medication. This medication may make you feel generally unwell. This is not uncommon as chemotherapy can affect healthy cells as well as cancer cells. Report any side effects. Continue your course of treatment even though you feel ill unless your care team tells you to stop. This medication may increase your risk of getting an infection. Call your care team for advice if you get a fever, chills, sore throat, or other symptoms of a cold or flu. Do not treat yourself. Try to avoid being around people who are sick. Avoid taking medications that contain aspirin, acetaminophen, ibuprofen, naproxen, or ketoprofen unless instructed by your care team. These medications may hide a fever. Be careful brushing or flossing your teeth or using a toothpick because you may get an infection or bleed more easily. If you have any dental work done, tell your dentist you are receiving this medication. This medication can make you more sensitive to cold. Do not drink cold drinks or use ice. Cover exposed skin before coming in contact with cold temperatures or cold objects. When out in cold weather wear warm clothing and cover your mouth and nose to warm the air that goes into your lungs. Tell your care team if you get sensitive to the cold. Talk to your care team if you or your partner are pregnant or think either of you might be pregnant. This medication can  cause serious birth defects if taken during pregnancy and for 9 months after the last dose. A negative pregnancy test is required before starting this medication. A reliable form of contraception is recommended while taking this medication and for 9 months after the last dose. Talk to your care team about effective forms of contraception. Do not father a child while taking this medication and for 6 months after the last dose. Use a condom while having sex during this time period. Do not breastfeed while taking this  medication and for 3 months after the last dose. This medication may cause infertility. Talk to your care team if you are concerned about your fertility. What side effects may I notice from receiving this medication? Side effects that you should report to your care team as soon as possible: Allergic reactions--skin rash, itching, hives, swelling of the face, lips, tongue, or throat Bleeding--bloody or black, tar-like stools, vomiting blood or brown material that looks like coffee grounds, red or dark brown urine, small red or purple spots on skin, unusual bruising or bleeding Dry cough, shortness of breath or trouble breathing Heart rhythm changes--fast or irregular heartbeat, dizziness, feeling faint or lightheaded, chest pain, trouble breathing Infection--fever, chills, cough, sore throat, wounds that don't heal, pain or trouble when passing urine, general feeling of discomfort or being unwell Liver injury--right upper belly pain, loss of appetite, nausea, light-colored stool, dark yellow or brown urine, yellowing skin or eyes, unusual weakness or fatigue Low red blood cell level--unusual weakness or fatigue, dizziness, headache, trouble breathing Muscle injury--unusual weakness or fatigue, muscle pain, dark yellow or brown urine, decrease in amount of urine Pain, tingling, or numbness in the hands or feet Sudden and severe headache, confusion, change in vision, seizures, which may be signs of posterior reversible encephalopathy syndrome (PRES) Unusual bruising or bleeding Side effects that usually do not require medical attention (report to your care team if they continue or are bothersome): Diarrhea Nausea Pain, redness, or swelling with sores inside the mouth or throat Unusual weakness or fatigue Vomiting This list may not describe all possible side effects. Call your doctor for medical advice about side effects. You may report side effects to FDA at 1-800-FDA-1088. Where should I keep my  medication? This medication is given in a hospital or clinic. It will not be stored at home. NOTE: This sheet is a summary. It may not cover all possible information. If you have questions about this medicine, talk to your doctor, pharmacist, or health care provider.  2023 Elsevier/Gold Standard (2021-05-16 00:00:00)  Leucovorin Injection What is this medication? LEUCOVORIN (loo koe VOR in) prevents side effects from certain medications, such as methotrexate. It works by increasing folate levels. This helps protect healthy cells in your body. It may also be used to treat anemia caused by low levels of folate. It can also be used with fluorouracil, a type of chemotherapy, to treat colorectal cancer. It works by increasing the effects of fluorouracil in the body. This medicine may be used for other purposes; ask your health care provider or pharmacist if you have questions. What should I tell my care team before I take this medication? They need to know if you have any of these conditions: Anemia from low levels of vitamin B12 in the blood An unusual or allergic reaction to leucovorin, folic acid, other medications, foods, dyes, or preservatives Pregnant or trying to get pregnant Breastfeeding How should I use this medication? This medication is injected into a vein or a  muscle. It is given by your care team in a hospital or clinic setting. Talk to your care team about the use of this medication in children. Special care may be needed. Overdosage: If you think you have taken too much of this medicine contact a poison control center or emergency room at once. NOTE: This medicine is only for you. Do not share this medicine with others. What if I miss a dose? Keep appointments for follow-up doses. It is important not to miss your dose. Call your care team if you are unable to keep an appointment. What may interact with this  medication? Capecitabine Fluorouracil Phenobarbital Phenytoin Primidone Trimethoprim;sulfamethoxazole This list may not describe all possible interactions. Give your health care provider a list of all the medicines, herbs, non-prescription drugs, or dietary supplements you use. Also tell them if you smoke, drink alcohol, or use illegal drugs. Some items may interact with your medicine. What should I watch for while using this medication? Your condition will be monitored carefully while you are receiving this medication. This medication may increase the side effects of 5-fluorouracil. Tell your care team if you have diarrhea or mouth sores that do not get better or that get worse. What side effects may I notice from receiving this medication? Side effects that you should report to your care team as soon as possible: Allergic reactions--skin rash, itching, hives, swelling of the face, lips, tongue, or throat This list may not describe all possible side effects. Call your doctor for medical advice about side effects. You may report side effects to FDA at 1-800-FDA-1088. Where should I keep my medication? This medication is given in a hospital or clinic. It will not be stored at home. NOTE: This sheet is a summary. It may not cover all possible information. If you have questions about this medicine, talk to your doctor, pharmacist, or health care provider.  2023 Elsevier/Gold Standard (2021-05-30 00:00:00)  Fluorouracil Injection What is this medication? FLUOROURACIL (flure oh YOOR a sil) treats some types of cancer. It works by slowing down the growth of cancer cells. This medicine may be used for other purposes; ask your health care provider or pharmacist if you have questions. COMMON BRAND NAME(S): Adrucil What should I tell my care team before I take this medication? They need to know if you have any of these conditions: Blood disorders Dihydropyrimidine dehydrogenase (DPD)  deficiency Infection, such as chickenpox, cold sores, herpes Kidney disease Liver disease Poor nutrition Recent or ongoing radiation therapy An unusual or allergic reaction to fluorouracil, other medications, foods, dyes, or preservatives If you or your partner are pregnant or trying to get pregnant Breast-feeding How should I use this medication? This medication is injected into a vein. It is administered by your care team in a hospital or clinic setting. Talk to your care team about the use of this medication in children. Special care may be needed. Overdosage: If you think you have taken too much of this medicine contact a poison control center or emergency room at once. NOTE: This medicine is only for you. Do not share this medicine with others. What if I miss a dose? Keep appointments for follow-up doses. It is important not to miss your dose. Call your care team if you are unable to keep an appointment. What may interact with this medication? Do not take this medication with any of the following: Live virus vaccines This medication may also interact with the following: Medications that treat or prevent blood clots, such  as warfarin, enoxaparin, dalteparin This list may not describe all possible interactions. Give your health care provider a list of all the medicines, herbs, non-prescription drugs, or dietary supplements you use. Also tell them if you smoke, drink alcohol, or use illegal drugs. Some items may interact with your medicine. What should I watch for while using this medication? Your condition will be monitored carefully while you are receiving this medication. This medication may make you feel generally unwell. This is not uncommon as chemotherapy can affect healthy cells as well as cancer cells. Report any side effects. Continue your course of treatment even though you feel ill unless your care team tells you to stop. In some cases, you may be given additional medications  to help with side effects. Follow all directions for their use. This medication may increase your risk of getting an infection. Call your care team for advice if you get a fever, chills, sore throat, or other symptoms of a cold or flu. Do not treat yourself. Try to avoid being around people who are sick. This medication may increase your risk to bruise or bleed. Call your care team if you notice any unusual bleeding. Be careful brushing or flossing your teeth or using a toothpick because you may get an infection or bleed more easily. If you have any dental work done, tell your dentist you are receiving this medication. Avoid taking medications that contain aspirin, acetaminophen, ibuprofen, naproxen, or ketoprofen unless instructed by your care team. These medications may hide a fever. Do not treat diarrhea with over the counter products. Contact your care team if you have diarrhea that lasts more than 2 days or if it is severe and watery. This medication can make you more sensitive to the sun. Keep out of the sun. If you cannot avoid being in the sun, wear protective clothing and sunscreen. Do not use sun lamps, tanning beds, or tanning booths. Talk to your care team if you or your partner wish to become pregnant or think you might be pregnant. This medication can cause serious birth defects if taken during pregnancy and for 3 months after the last dose. A reliable form of contraception is recommended while taking this medication and for 3 months after the last dose. Talk to your care team about effective forms of contraception. Do not father a child while taking this medication and for 3 months after the last dose. Use a condom while having sex during this time period. Do not breastfeed while taking this medication. This medication may cause infertility. Talk to your care team if you are concerned about your fertility. What side effects may I notice from receiving this medication? Side effects that you  should report to your care team as soon as possible: Allergic reactions--skin rash, itching, hives, swelling of the face, lips, tongue, or throat Heart attack--pain or tightness in the chest, shoulders, arms, or jaw, nausea, shortness of breath, cold or clammy skin, feeling faint or lightheaded Heart failure--shortness of breath, swelling of the ankles, feet, or hands, sudden weight gain, unusual weakness or fatigue Heart rhythm changes--fast or irregular heartbeat, dizziness, feeling faint or lightheaded, chest pain, trouble breathing High ammonia level--unusual weakness or fatigue, confusion, loss of appetite, nausea, vomiting, seizures Infection--fever, chills, cough, sore throat, wounds that don't heal, pain or trouble when passing urine, general feeling of discomfort or being unwell Low red blood cell level--unusual weakness or fatigue, dizziness, headache, trouble breathing Pain, tingling, or numbness in the hands or feet, muscle weakness,  change in vision, confusion or trouble speaking, loss of balance or coordination, trouble walking, seizures Redness, swelling, and blistering of the skin over hands and feet Severe or prolonged diarrhea Unusual bruising or bleeding Side effects that usually do not require medical attention (report to your care team if they continue or are bothersome): Dry skin Headache Increased tears Nausea Pain, redness, or swelling with sores inside the mouth or throat Sensitivity to light Vomiting This list may not describe all possible side effects. Call your doctor for medical advice about side effects. You may report side effects to FDA at 1-800-FDA-1088. Where should I keep my medication? This medication is given in a hospital or clinic. It will not be stored at home. NOTE: This sheet is a summary. It may not cover all possible information. If you have questions about this medicine, talk to your doctor, pharmacist, or health care provider.  2023 Elsevier/Gold  Standard (2021-05-27 00:00:00)  The chemotherapy medication bag should finish at 46 hours, 96 hours, or 7 days. For example, if your pump is scheduled for 46 hours and it was put on at 4:00 p.m., it should finish at 2:00 p.m. the day it is scheduled to come off regardless of your appointment time.     Estimated time to finish at 1:00 p.m. on Friday 09/12/2021.   If the display on your pump reads "Low Volume" and it is beeping, take the batteries out of the pump and come to the cancer center for it to be taken off.   If the pump alarms go off prior to the pump reading "Low Volume" then call [phone] and someone can assist you.  If the plunger comes out and the chemotherapy medication is leaking out, please use your home chemo spill kit to clean up the spill. Do NOT use paper towels or other household products.  If you have problems or questions regarding your pump, please call either [phone] (24 hours a day) or the cancer center Monday-Friday 8:00 a.m.- 4:30 p.m. at the clinic number and we will assist you. If you are unable to get assistance, then go to the nearest Emergency Department and ask the staff to contact the IV team for assistance.

## 2021-09-10 NOTE — Telephone Encounter (Addendum)
Oral Oncology Patient Advocate Encounter  After completing a benefits investigation, prior authorization for Capecitabine is not required at this time as patient is currently uninsured.  Patient has been previously approved for [employer] in amount of $1000. This grant will cover the full cost of this medication.    Lady Deutscher, CPhT-Adv Pharmacy Patient Advocate Specialist [place] [facility] Patient Advocate Team Direct Number: [phone]  Fax: [phone]

## 2021-09-10 NOTE — Progress Notes (Signed)
Patient seen by Dr. Sherrill today ? ?Vitals are within treatment parameters. ? ?Labs reviewed by Dr. Sherrill and are within treatment parameters. ? ?Per physician team, patient is ready for treatment and there are NO modifications to the treatment plan.  ?

## 2021-09-11 ENCOUNTER — Other Ambulatory Visit: Payer: Self-pay

## 2021-09-11 ENCOUNTER — Telehealth: Payer: Self-pay | Admitting: Radiation Oncology

## 2021-09-11 NOTE — Telephone Encounter (Signed)
Called patient to schedule a follow up appointment w. Alison. No answer, LVM for a return call. 

## 2021-09-12 ENCOUNTER — Telehealth: Payer: Self-pay | Admitting: Radiation Oncology

## 2021-09-12 ENCOUNTER — Inpatient Hospital Stay: Payer: Medicaid Other

## 2021-09-12 VITALS — BP 113/71 | HR 78 | Temp 98.3°F | Resp 20

## 2021-09-12 DIAGNOSIS — Z5111 Encounter for antineoplastic chemotherapy: Secondary | ICD-10-CM | POA: Diagnosis not present

## 2021-09-12 DIAGNOSIS — C20 Malignant neoplasm of rectum: Secondary | ICD-10-CM

## 2021-09-12 MED ORDER — HEPARIN SOD (PORK) LOCK FLUSH 100 UNIT/ML IV SOLN
500.0000 [IU] | Freq: Once | INTRAVENOUS | Status: AC | PRN
  Administered 2021-09-12: 500 [IU]

## 2021-09-12 MED ORDER — SODIUM CHLORIDE 0.9% FLUSH
10.0000 mL | INTRAVENOUS | Status: DC | PRN
  Administered 2021-09-12: 10 mL

## 2021-09-12 MED ORDER — PEGFILGRASTIM-CBQV 6 MG/0.6ML ~~LOC~~ SOSY
6.0000 mg | PREFILLED_SYRINGE | Freq: Once | SUBCUTANEOUS | Status: AC
  Administered 2021-09-12: 6 mg via SUBCUTANEOUS
  Filled 2021-09-12: qty 0.6

## 2021-09-12 NOTE — Telephone Encounter (Signed)
Called patient to schedule a follow up appointment w. Alison. No answer, LVM for a return call. 

## 2021-09-12 NOTE — Patient Instructions (Signed)
Heparin injection What is this medication? HEPARIN (HEP a rin) is an anticoagulant. It is used to treat or prevent clots in the veins, arteries, lungs, or heart. It stops clots from forming or getting bigger. This medicine prevents clotting during open-heart surgery, dialysis, or in patients who are confined to bed. This medicine may be used for other purposes; ask your health care provider or pharmacist if you have questions. COMMON BRAND NAME(S): Hep-Lock, Hep-Lock U/P, Hepflush-10, Monoject Prefill Advanced Heparin Lock Flush, SASH Normal Saline and Heparin What should I tell my care team before I take this medication? They need to know if you have any of these conditions: bleeding disorders, such as hemophilia or low blood platelets bowel disease or diverticulitis endocarditis high blood pressure liver disease recent surgery or delivery of a baby stomach ulcers an unusual or allergic reaction to heparin, benzyl alcohol, sulfites, other medicines, foods, dyes, or preservatives pregnant or trying to get pregnant breast-feeding How should I use this medication? This medicine is given by injection or infusion into a vein. It can also be given by injection of small amounts under the skin. It is usually given by a health care professional in a hospital or clinic setting. If you get this medicine at home, you will be taught how to prepare and give this medicine. Use exactly as directed. Take your medicine at regular intervals. Do not take it more often than directed. Do not stop taking except on your doctor's advice. Stopping this medicine may increase your risk of a blot clot. Be sure to refill your prescription before you run out of medicine. It is important that you put your used needles and syringes in a special sharps container. Do not put them in a trash can. If you do not have a sharps container, call your pharmacist or healthcare provider to get one. Talk to your pediatrician regarding the  use of this medicine in children. While this medicine may be prescribed for children for selected conditions, precautions do apply. Overdosage: If you think you have taken too much of this medicine contact a poison control center or emergency room at once. NOTE: This medicine is only for you. Do not share this medicine with others. What if I miss a dose? If you miss a dose, take it as soon as you can. If it is almost time for your next dose, take only that dose. Do not take double or extra doses. What may interact with this medication? Do not take this medicine with any of the following medications: aspirin and aspirin-like drugs mifepristone medicines that treat or prevent blood clots like warfarin, enoxaparin, and dalteparin palifermin protamine This medicine may also interact with the following medications: dextran digoxin hydroxychloroquine medicines for treating colds or allergies nicotine NSAIDs, medicines for pain and inflammation, like ibuprofen or naproxen phenylbutazone tetracycline antibiotics This list may not describe all possible interactions. Give your health care provider a list of all the medicines, herbs, non-prescription drugs, or dietary supplements you use. Also tell them if you smoke, drink alcohol, or use illegal drugs. Some items may interact with your medicine. What should I watch for while using this medication? Visit your healthcare professional for regular checks on your progress. You may need blood work done while you are taking this medicine. Your condition will be monitored carefully while you are receiving this medicine. It is important not to miss any appointments. Wear a medical ID bracelet or chain, and carry a card that describes your disease and details   of your medicine and dosage times. Notify your doctor or healthcare professional at once if you have cold, blue hands or feet. If you are going to need surgery or other procedure, tell your healthcare  professional that you are using this medicine. Avoid sports and activities that might cause injury while you are using this medicine. Severe falls or injuries can cause unseen bleeding. Be careful when using sharp tools or knives. Consider using an electric razor. Take special care brushing or flossing your teeth. Report any injuries, bruising, or red spots on the skin to your healthcare professional. Using this medicine for a long time may weaken your bones and increase the risk of bone fractures. You should make sure that you get enough calcium and vitamin D while you are taking this medicine. Discuss the foods you eat and the vitamins you take with your healthcare professional. Wear a medical ID bracelet or chain. Carry a card that describes your disease and details of your medicine and dosage times. What side effects may I notice from receiving this medication? Side effects that you should report to your doctor or health care professional as soon as possible: allergic reactions like skin rash, itching or hives, swelling of the face, lips, or tongue bone pain fever, chills nausea, vomiting signs and symptoms of bleeding such as bloody or black, tarry stools; red or dark-brown urine; spitting up blood or brown material that looks like coffee grounds; red spots on the skin; unusual bruising or bleeding from the eye, gums, or nose signs and symptoms of a blood clot such as chest pain; shortness of breath; pain, swelling, or warmth in the leg signs and symptoms of a stroke such as changes in vision; confusion; trouble speaking or understanding; severe headaches; sudden numbness or weakness of the face, arm or leg; trouble walking; dizziness; loss of coordination Side effects that usually do not require medical attention (report to your doctor or health care professional if they continue or are bothersome): hair loss pain, redness, or irritation at site where injected This list may not describe all  possible side effects. Call your doctor for medical advice about side effects. You may report side effects to FDA at 1-800-FDA-1088. Where should I keep my medication? Keep out of the reach of children. Store unopened vials at room temperature between 15 and 30 degrees C (59 and 86 degrees F). Do not freeze. Do not use if solution is discolored or particulate matter is present. Throw away any unused medicine after the expiration date. NOTE: This sheet is a summary. It may not cover all possible information. If you have questions about this medicine, talk to your doctor, pharmacist, or health care provider.  2023 Elsevier/Gold Standard (2004-10-27 00:00:00)  

## 2021-09-15 ENCOUNTER — Telehealth: Payer: Self-pay | Admitting: Radiation Oncology

## 2021-09-15 NOTE — Telephone Encounter (Signed)
Called patient to schedule a follow up appointment w. Alison. No answer, LVM for a return call. 

## 2021-09-15 NOTE — Telephone Encounter (Signed)
Sent unable to contact letter 8/14.

## 2021-09-17 ENCOUNTER — Other Ambulatory Visit (HOSPITAL_COMMUNITY): Payer: Self-pay

## 2021-09-17 ENCOUNTER — Encounter: Payer: Self-pay | Admitting: Oncology

## 2021-09-17 NOTE — Telephone Encounter (Signed)
Oral Chemotherapy Pharmacist Encounter  Jeremiah Allen plans on picking up his Xeloda from [place] on 09/23/21. He knows not to start until his first day of radiation.   Patient Education I spoke with patient for overview of new oral chemotherapy medication: Xeloda (capecitabine) for the treatment of rectal cancer in conjunction with radiation, planned duration until the end of radiation. Anticipated start 09/29/21.   Pt is doing well. Counseled patient on administration, dosing, side effects, monitoring, drug-food interactions, safe handling, storage, and disposal. Patient will take 3 tablets (1,500 mg total) by mouth 2 (two) times daily after a meal. Take Monday-Friday, Take only on days of radiation.  Side effects include but not limited to: diarrhea, hand-foot syndrome, mouth sores, edema, decreased wbc, fatigue, N/V Diarrhea: Recommended patient pick up loperamide to have on hand if needed. He knows to call the office if he is having 4 or more loose stools per day Hand-foot syndrome: Recommended the use Udderly Smooth Extra Care 20 Mouth sores: Patient knows to call for magic mouthwash if needed.    Reviewed with patient importance of keeping a medication schedule and plan for any missed doses.  After discussion with patient no patient barriers to medication adherence identified.   Jeremiah Allen voiced understanding and appreciation. All questions answered. Medication handout provided.  Provided patient with [facility] phone number. Patient knows to call the office with questions or concerns. Oral Chemotherapy [facility] will continue to follow.  Darl Pikes, PharmD, BCPS, BCOP, CPP Hematology/Oncology Clinical Pharmacist Practitioner /DB/AP [facility] [phone]  09/17/2021 1:27 PM

## 2021-09-18 ENCOUNTER — Other Ambulatory Visit (HOSPITAL_COMMUNITY): Payer: Self-pay

## 2021-09-18 NOTE — Progress Notes (Signed)
Radiation Oncology         [id number]) [phone] ________________________________  Name: Jeremiah Allen        MRN: [id number]  Date of Service: 09/23/2021 DOB: Nov 16, 1985  BJ:SEGBTDV, Maebelle Munroe, MD     REFERRING PHYSICIAN: Dr. Benay Spice   DIAGNOSIS: The encounter diagnosis was Rectal cancer metastasized to liver [facility]).   HISTORY OF PRESENT ILLNESS: Jeremiah Allen is a 36 y.o. male with a diagnosis of oligometastatic rectal cancer.  The patient presented with low abdominal pain rectal bleeding and changes in bowel habits since the beginning of 2023. ED evaluation and CT angiography of the abdomen showed bulky irregular wall thickening of the rectum with perirectal lymphadenopathy, 2 indeterminate liver lesions and sigmoidoscopy on 04/16/2021 showed an invasive appearing rectal mass.  Biopsy of this was sent off and was consistent with moderately differentiated adenocarcinoma.  MRI of the pelvis and liver on 04/17/21 showed T4aN2 rectal involvement with metastatic appearing disease in the right hepatic lobe with 3 discrete lesions measuring up to 1.9 cm, 1.4 cm, and 1.1 cm.  His tumor in the pelvis measured 4.8 cm, it was circumferential and was 10 cm from the anal verge and 6 cm from the anal sphincter.  CT of the chest with contrast was performed on 04/18/2021 and no metastatic appearing disease was present.    We met with the patient a few months ago. He received chemotherapy between 04/30/21 and 09/10/21. His interval restaging CT on 07/17/21 showed decreased thickening of the rectal wall lesion and perirectal adenopathy, and decrease in the size of his hepatic disease. He underwent open partial hepatectomy on 08/04/21 and this showed atypical cells in the segment 7 lesion, and focal fibroiss of segment 6 and segment 8 lesions felt to be likely complete response to therapy. He's seen to consider chemoRT to the pelvis.    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       PAST SURGICAL HISTORY: Past Surgical History:  Procedure Laterality Date   FLEXIBLE SIGMOIDOSCOPY N/A 04/16/2021   Procedure: FLEXIBLE SIGMOIDOSCOPY;  Surgeon: Toledo, Benay Pike, MD;  Location: [facility] ENDOSCOPY;  Service: Gastroenterology;  Laterality: N/A;   IR IMAGING GUIDED PORT INSERTION  04/28/2021   LAPAROSCOPY N/A 08/04/2021   Procedure: STAGING LAPAROSCOPY WITH INTRAOPERATIVE ULTRASOUND;  Surgeon: Dwan Bolt, MD;  Location: [place];  Service: General;  Laterality: N/A;   OPEN PARTIAL HEPATECTOMY  N/A 08/04/2021   Procedure: OPEN PARTIAL HEPATECTOMY;  Surgeon: Dwan Bolt, MD;  Location: [facility];  Service: General;  Laterality: N/A;     FAMILY HISTORY:  Family History  Problem Relation Age of Onset   Heart disease Mother    Miscarriages / Stillbirths Sister    Asthma Sister    Stroke Sister        at birth   Developmental delay Brother      SOCIAL HISTORY:  reports that he has been smoking cigarettes. He has a 4.90 pack-year smoking history. He does not have any smokeless tobacco history on file. He reports current drug use. Frequency: 3.00 times per week. He reports that he does not drink alcohol. The patient is single but in a relationship and engaged. He works as a [employer]in [employer].  ALLERGIES: Crab extract allergy skin test and Dilaudid [hydromorphone]   MEDICATIONS:  Current Outpatient Medications  Medication Sig Dispense Refill   acetaminophen (TYLENOL) 500 MG tablet Take 500-1,000 mg by mouth every 6 (six) hours as needed for mild pain or moderate pain.     albuterol (VENTOLIN HFA) 108 (90 Base) MCG/ACT inhaler Inhale 1-2 puffs into the lungs every 6 (six) hours as needed for wheezing or shortness of breath.     ARIPiprazole (ABILIFY) 5 MG tablet Take 10 mg by mouth at bedtime.      capecitabine (XELODA) 500 MG tablet Take 3 tablets (1,500 mg total) by mouth 2 (two) times daily after a meal. Take Monday-Friday, Take only on days of radiation. 168 tablet 0   docusate sodium (COLACE) 100 MG capsule Take 1 capsule (100 mg total) by mouth 2 (two) times daily. 28 capsule 0   famotidine (PEPCID) 20 MG tablet Take 20 mg by mouth 2 (two) times daily.     gabapentin (NEURONTIN) 600 MG tablet Take 1 tablet (600 mg total) by mouth 3 (three) times daily. 90 tablet 0   hydrOXYzine (ATARAX) 25 MG tablet Take 1 tablet (25 mg total) by mouth 3 (three) times daily as needed. 60 tablet 0   lidocaine-prilocaine (EMLA) cream Apply 1 application. topically as needed. 30 g 0   methocarbamol (ROBAXIN) 500 MG tablet Take 2 tablets (1,000 mg total) by mouth every 6 (six) hours as needed for muscle spasms. 60 tablet 0   ondansetron (ZOFRAN) 8 MG tablet Take 1 tablet (8 mg total) by mouth every 8 (eight) hours as needed for nausea or vomiting (May start using after day 3 for nausea and vomiting). 30 tablet 3   polyethylene glycol (MIRALAX / GLYCOLAX) 17 g packet Take 17 g by mouth daily as needed for moderate constipation. (Patient taking differently: Take 17 g by mouth daily as needed for moderate constipation (as needed).) 14 each 0   prochlorperazine (COMPAZINE) 10 MG tablet Take 1 tablet (10 mg total) by mouth every 6 (six) hours as needed for nausea or vomiting. (Patient not taking: Reported on 08/21/2021) 30 tablet 2   No current facility-administered medications for this encounter.     REVIEW OF SYSTEMS: On review of systems, the patient reports that he has been doing pretty well. He has had some peripheral neuropathy in fingertips and toes, but feels that this is intermittent. He is having 4-6 stools per day most days. No mucous is noted. Pain with sitting has improved slightly as well. No other complaints are verbalized.        PHYSICAL EXAM:  Wt Readings from Last 3 Encounters:  09/23/21  165 lb 8 oz (75.1 kg)  09/10/21 166 lb 6.4 oz (75.5 kg)  08/27/21 167 lb 12.8 oz (76.1 kg)   Temp Readings from Last 3 Encounters:  09/23/21 97.9 F (36.6 C) (Oral)  09/12/21 98.3 F (36.8 C) (Oral)  09/10/21 98.1 F (36.7 C) (Oral)   BP Readings from Last 3 Encounters:  09/23/21 120/73  09/12/21 113/71  09/10/21 113/69   Pulse Readings from Last 3 Encounters:  09/23/21 68  09/12/21 78  09/10/21 70   Pain Assessment Pain Score: 0-No pain/10    In general this is a well appearing caucasian male in no acute distress. He's alert and oriented x4 and appropriate throughout the examination. Cardiopulmonary assessment is negative for acute distress and he exhibits normal effort.     ECOG = 1  0 - Asymptomatic (  Fully active, able to carry on all predisease activities without restriction)  1 - Symptomatic but completely ambulatory (Restricted in physically strenuous activity but ambulatory and able to carry out work of a light or sedentary nature. For example, light housework, office work)  2 - Symptomatic, <50% in bed during the day (Ambulatory and capable of all self care but unable to carry out any work activities. Up and about more than 50% of waking hours)  3 - Symptomatic, >50% in bed, but not bedbound (Capable of only limited self-care, confined to bed or chair 50% or more of waking hours)  4 - Bedbound (Completely disabled. Cannot carry on any self-care. Totally confined to bed or chair)  5 - Death   Eustace Pen MM, Creech RH, Tormey DC, et al. [phone]). "Toxicity and response criteria of the [facility]". [place] Oncol. 5 (6): 649-55    LABORATORY DATA:  Lab Results  Component Value Date   WBC 6.3 09/10/2021   HGB 13.1 09/10/2021   HCT 39.3 09/10/2021   MCV 93.3 09/10/2021   PLT 162 09/10/2021   Lab Results  Component Value Date   NA 138 09/10/2021   K 3.9 09/10/2021   CL 105 09/10/2021   CO2 24 09/10/2021   Lab Results  Component  Value Date   ALT 40 09/10/2021   AST 22 09/10/2021   ALKPHOS 105 09/10/2021   BILITOT 0.3 09/10/2021      RADIOGRAPHY: No results found.     IMPRESSION/PLAN: 1. Stage IV, ME2AS3M1D adenocarcinoma of the rectum. The patient has responded well to systemic therapy. Dr. Benay Spice and Dr. [name] are in agreement for locally aggressive approach to his care as a result. We reviewed the rationale for chemoRT.  We discussed the risks, benefits, short, and long term effects of radiotherapy, as well as the goals of curative local intent. Written consent is obtained and placed in the chart, a copy was provided to the patient. He will simulate today and start therapy on 09/29/21. He will be followed as well regarding his liver, and could benefit from additional treatment locally if he had progression.  2. 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 We discussed the importance of evaluation with physical therapy prior to pelvic radiation. A referral was placed to physical therapy today.     In a visit lasting 45 minutes, greater than 50% of the time was spent face to face discussing the patient's condition, in preparation for the discussion, and coordinating the patient's care.     Carola Rhine, [facility]   **Disclaimer: This note was dictated with voice recognition software. Similar sounding words can inadvertently be transcribed and this note may contain transcription errors which may not have been corrected upon publication of note.**

## 2021-09-23 ENCOUNTER — Ambulatory Visit
Admission: RE | Admit: 2021-09-23 | Discharge: 2021-09-23 | Disposition: A | Discharge status: Home / Self Care | Payer: Medicaid Other | Source: Ambulatory Visit | Attending: Radiation Oncology | Admitting: Radiation Oncology

## 2021-09-23 ENCOUNTER — Encounter: Payer: Self-pay | Admitting: Radiation Oncology

## 2021-09-23 ENCOUNTER — Other Ambulatory Visit: Payer: Self-pay

## 2021-09-23 VITALS — BP 120/73 | HR 68 | Temp 97.9°F | Resp 18 | Ht 69.0 in | Wt 165.5 lb

## 2021-09-23 DIAGNOSIS — C20 Malignant neoplasm of rectum: Secondary | ICD-10-CM | POA: Insufficient documentation

## 2021-09-23 DIAGNOSIS — Z510 Encounter for antineoplastic radiation therapy: Secondary | ICD-10-CM | POA: Diagnosis not present

## 2021-09-23 DIAGNOSIS — G629 Polyneuropathy, unspecified: Secondary | ICD-10-CM | POA: Insufficient documentation

## 2021-09-23 DIAGNOSIS — K219 Gastro-esophageal reflux disease without esophagitis: Secondary | ICD-10-CM | POA: Insufficient documentation

## 2021-09-23 DIAGNOSIS — F17210 Nicotine dependence, cigarettes, uncomplicated: Secondary | ICD-10-CM | POA: Diagnosis not present

## 2021-09-23 DIAGNOSIS — Z79899 Other long term (current) drug therapy: Secondary | ICD-10-CM | POA: Diagnosis not present

## 2021-09-23 DIAGNOSIS — Z8673 Personal history of transient ischemic attack (TIA), and cerebral infarction without residual deficits: Secondary | ICD-10-CM | POA: Insufficient documentation

## 2021-09-23 DIAGNOSIS — C787 Secondary malignant neoplasm of liver and intrahepatic bile duct: Secondary | ICD-10-CM | POA: Insufficient documentation

## 2021-09-23 NOTE — Progress Notes (Signed)
Follow-up-new appointment. I verified patient's identity and began nursing interview w/ significant other Ms. [name] in attendance. No issues/ discomfort reported at this time.  Meaningful use complete.  BP 120/73 (BP Location: Left Arm, Patient Position: Sitting, Cuff Size: Normal)   Pulse 68   Temp 97.9 F (36.6 C) (Oral)   Resp 18   Ht '5\' 9"'$  (1.753 m)   Wt 165 lb 8 oz (75.1 kg)   SpO2 99%   BMI 24.44 kg/m

## 2021-09-25 DIAGNOSIS — C787 Secondary malignant neoplasm of liver and intrahepatic bile duct: Secondary | ICD-10-CM | POA: Diagnosis not present

## 2021-09-25 DIAGNOSIS — C20 Malignant neoplasm of rectum: Secondary | ICD-10-CM | POA: Diagnosis not present

## 2021-09-25 DIAGNOSIS — Z510 Encounter for antineoplastic radiation therapy: Secondary | ICD-10-CM | POA: Diagnosis not present

## 2021-09-26 ENCOUNTER — Inpatient Hospital Stay: Payer: Medicaid Other | Admitting: Nurse Practitioner

## 2021-09-26 ENCOUNTER — Inpatient Hospital Stay: Payer: Medicaid Other

## 2021-09-26 ENCOUNTER — Inpatient Hospital Stay (HOSPITAL_BASED_OUTPATIENT_CLINIC_OR_DEPARTMENT_OTHER): Payer: Medicaid Other | Admitting: Nurse Practitioner

## 2021-09-26 ENCOUNTER — Other Ambulatory Visit (HOSPITAL_BASED_OUTPATIENT_CLINIC_OR_DEPARTMENT_OTHER): Payer: Self-pay

## 2021-09-26 ENCOUNTER — Encounter: Payer: Self-pay | Admitting: Nurse Practitioner

## 2021-09-26 VITALS — BP 114/73 | HR 73 | Temp 98.2°F | Resp 20 | Ht 69.0 in | Wt 166.4 lb

## 2021-09-26 DIAGNOSIS — C787 Secondary malignant neoplasm of liver and intrahepatic bile duct: Secondary | ICD-10-CM | POA: Diagnosis not present

## 2021-09-26 DIAGNOSIS — C20 Malignant neoplasm of rectum: Secondary | ICD-10-CM

## 2021-09-26 DIAGNOSIS — Z5111 Encounter for antineoplastic chemotherapy: Secondary | ICD-10-CM | POA: Diagnosis not present

## 2021-09-26 LAB — CMP (CANCER CENTER ONLY)
ALT: 26 U/L (ref 0–44)
AST: 21 U/L (ref 15–41)
Albumin: 4.8 g/dL (ref 3.5–5.0)
Alkaline Phosphatase: 102 U/L (ref 38–126)
Anion gap: 7 (ref 5–15)
BUN: 11 mg/dL (ref 6–20)
CO2: 27 mmol/L (ref 22–32)
Calcium: 9.5 mg/dL (ref 8.9–10.3)
Chloride: 103 mmol/L (ref 98–111)
Creatinine: 0.92 mg/dL (ref 0.61–1.24)
GFR, Estimated: >60 mL/min (ref >60)
Glucose, Bld: 84 mg/dL (ref 70–99)
Potassium: 4.3 mmol/L (ref 3.5–5.1)
Sodium: 137 mmol/L (ref 135–145)
Total Bilirubin: 0.3 mg/dL (ref 0.3–1.2)
Total Protein: 7.8 g/dL (ref 6.5–8.1)

## 2021-09-26 LAB — CBC WITH DIFFERENTIAL (CANCER CENTER ONLY)
Abs Immature Granulocytes: 0.46 10*3/uL — ABNORMAL HIGH (ref 0.00–0.07)
Basophils Absolute: 0 10*3/uL (ref 0.0–0.1)
Basophils Relative: 0 %
Eosinophils Absolute: 0 10*3/uL (ref 0.0–0.5)
Eosinophils Relative: 0 %
HCT: 40.6 % (ref 39.0–52.0)
Hemoglobin: 13.7 g/dL (ref 13.0–17.0)
Immature Granulocytes: 5 %
Lymphocytes Relative: 20 %
Lymphs Abs: 2 10*3/uL (ref 0.7–4.0)
MCH: 30.9 pg (ref 26.0–34.0)
MCHC: 33.7 g/dL (ref 30.0–36.0)
MCV: 91.4 fL (ref 80.0–100.0)
Monocytes Absolute: 0.8 10*3/uL (ref 0.1–1.0)
Monocytes Relative: 8 %
Neutro Abs: 6.8 10*3/uL (ref 1.7–7.7)
Neutrophils Relative %: 67 %
Platelet Count: 202 10*3/uL (ref 150–400)
RBC: 4.44 MIL/uL (ref 4.22–5.81)
RDW: 13.7 % (ref 11.5–15.5)
WBC Count: 10.2 10*3/uL (ref 4.0–10.5)
nRBC: 0 % (ref 0.0–0.2)

## 2021-09-26 LAB — CEA (ACCESS): CEA (CHCC): 2.74 ng/mL (ref 0.00–5.00)

## 2021-09-26 MED ORDER — HYDROXYZINE HCL 25 MG PO TABS
25.0000 mg | ORAL_TABLET | Freq: Three times a day (TID) | ORAL | 0 refills | Status: DC | PRN
  Filled 2021-09-26: qty 60, 20d supply, fill #0

## 2021-09-26 NOTE — Progress Notes (Signed)
  [facility] OFFICE PROGRESS NOTE   Diagnosis: Rectal cancer  INTERVAL HISTORY:   Mr. Dyar returns for follow-up.  He is scheduled to begin radiation 09/30/2021.  Overall feeling well.  Appetite is good.  No nausea or vomiting.  No diarrhea.  Bowels are moving 4-5 times a day.  No bleeding or pain.  Mild numbness in the fingertips and toes.  Recently had some anxiety while driving.  Objective:  Vital signs in last 24 hours:  Blood pressure 114/73, pulse 73, temperature 98.2 F (36.8 C), resp. rate 20, height $RemoveBe'5\' 9"'YLLqEIjCI$  (1.753 m), weight 166 lb 6.4 oz (75.5 kg), SpO2 100 %.    HEENT: No thrush or ulcers. Resp: Lungs clear bilaterally. Cardio: Regular rate and rhythm. GI: No hepatomegaly.  Healed surgical incision at the right abdomen. Vascular: No leg edema. Skin: Palms without erythema. Port-A-Cath without erythema.   Lab Results:  Lab Results  Component Value Date   WBC 10.2 09/26/2021   HGB 13.7 09/26/2021   HCT 40.6 09/26/2021   MCV 91.4 09/26/2021   PLT 202 09/26/2021   NEUTROABS 6.8 09/26/2021    Imaging:  No results found.  Medications: I have reviewed the patient's current medications.  Assessment/Plan: Rectal cancer-clinical stage IV(T4aN2, M1) Sigmoidoscopy 04/16/2021-mass at 4 cm from the anal verge, invasive moderately differentiated adenocarcinoma with lymphovascular invasion OmniSeq-MSS, tumor mutation burden 3.9, PD-L1 less than 1%, K-ras G12 D CT angio abdomen/pelvis-bulky irregular rectal wall thickening with perirectal lymphadenopathy and 2 indeterminate liver lesions MRI liver 04/17/2021-3 discrete right liver metastases MRI pelvis 04/17/2021-T4aN2 rectal tumor-10 cm from the anal verge, involvement of left anterior peritoneal reflection, numerous mesorectal lymph nodes within the pelvis, no evidence of extra mesorectal adenopathy greater than 10 nodes in the mesorectum CT chest 04/18/2021-no evidence of metastatic disease Elevated CEA  (14.5 on 04/17/2021) Cycle 1 FOLFOX 04/30/2021 Cycle 2 FOLFOX 05/14/2021 Cycle 3 FOLFOX 05/28/2021, Udenyca Cycle 4 FOLFOX 06/11/2021, prophylactic dexamethasone added for delayed nausea, Udenyca Cycle 5 FOLFOX 06/25/2021, Udenyca Cycle 6 FOLFOX 07/08/2021, Udenyca CTs 07/17/2021-decrease in size of hepatic lesions, decreased rectal wall thickening, decrease size of perirectal lymph nodes, lingular consolidation consistent with infectious/inflammatory process 08/04/2021-right liver metastectomy procedures, segment 6, 7, 8 lesions removed.  The pathology from the segment 6 and segment 8 lesions revealed no residual carcinoma, the segment 7 lesion revealed pools of mucin and associated calcifications with rare atypical cells-partial to complete response, extensive thermal/mechanical artifact with focal ink on mucin, margin interpreted as negative, though a positive margin could not be excluded Cycle 7 FOLFOX 08/27/2021 Cycle 8 FOLFOX 09/10/2021 Radiation/Xeloda 09/30/2021 Rectal bleeding and pain secondary to #1 Chronic left sciatica Bipolar disorder Right testicular fullness noted on exam 04/21/2021-epididymal cyst? 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 Bennett appears stable.  He is scheduled to begin concurrent radiation/Xeloda 09/30/2021.  We again reviewed potential toxicities associated with Xeloda.  He agrees to proceed.  CBC reviewed.  Counts adequate to begin Xeloda as above.  He will return for follow-up on 10/10/2021.  We are available to see him sooner if needed.    Ned Card ANP/GNP-BC   09/26/2021  2:48 PM

## 2021-09-30 ENCOUNTER — Other Ambulatory Visit: Payer: Self-pay

## 2021-09-30 ENCOUNTER — Ambulatory Visit
Admission: RE | Admit: 2021-09-30 | Discharge: 2021-09-30 | Disposition: A | Discharge status: Home / Self Care | Payer: Medicaid Other | Source: Ambulatory Visit | Attending: Radiation Oncology | Admitting: Radiation Oncology

## 2021-09-30 DIAGNOSIS — C20 Malignant neoplasm of rectum: Secondary | ICD-10-CM | POA: Diagnosis not present

## 2021-09-30 DIAGNOSIS — Z510 Encounter for antineoplastic radiation therapy: Secondary | ICD-10-CM | POA: Diagnosis not present

## 2021-09-30 DIAGNOSIS — C787 Secondary malignant neoplasm of liver and intrahepatic bile duct: Secondary | ICD-10-CM | POA: Diagnosis not present

## 2021-09-30 LAB — RAD ONC ARIA SESSION SUMMARY
Course Elapsed Days: 0
Plan Fractions Treated to Date: 1
Plan Prescribed Dose Per Fraction: 1.8 Gy
Plan Total Fractions Prescribed: 25
Plan Total Prescribed Dose: 45 Gy
Reference Point Dosage Given to Date: 1.8 Gy
Reference Point Session Dosage Given: 1.8 Gy
Session Number: 1

## 2021-10-01 ENCOUNTER — Other Ambulatory Visit: Payer: Self-pay

## 2021-10-01 ENCOUNTER — Ambulatory Visit
Admission: RE | Admit: 2021-10-01 | Discharge: 2021-10-01 | Disposition: A | Discharge status: Home / Self Care | Payer: Medicaid Other | Source: Ambulatory Visit | Attending: Radiation Oncology | Admitting: Radiation Oncology

## 2021-10-01 DIAGNOSIS — C20 Malignant neoplasm of rectum: Secondary | ICD-10-CM | POA: Diagnosis not present

## 2021-10-01 DIAGNOSIS — C787 Secondary malignant neoplasm of liver and intrahepatic bile duct: Secondary | ICD-10-CM | POA: Diagnosis not present

## 2021-10-01 DIAGNOSIS — Z510 Encounter for antineoplastic radiation therapy: Secondary | ICD-10-CM | POA: Diagnosis not present

## 2021-10-01 LAB — RAD ONC ARIA SESSION SUMMARY
Course Elapsed Days: 1
Plan Fractions Treated to Date: 2
Plan Prescribed Dose Per Fraction: 1.8 Gy
Plan Total Fractions Prescribed: 25
Plan Total Prescribed Dose: 45 Gy
Reference Point Dosage Given to Date: 3.6 Gy
Reference Point Session Dosage Given: 1.8 Gy
Session Number: 2

## 2021-10-02 ENCOUNTER — Other Ambulatory Visit: Payer: Self-pay

## 2021-10-02 ENCOUNTER — Ambulatory Visit
Admission: RE | Admit: 2021-10-02 | Discharge: 2021-10-02 | Disposition: A | Discharge status: Home / Self Care | Payer: Medicaid Other | Source: Ambulatory Visit | Attending: Radiation Oncology | Admitting: Radiation Oncology

## 2021-10-02 DIAGNOSIS — Z510 Encounter for antineoplastic radiation therapy: Secondary | ICD-10-CM | POA: Diagnosis not present

## 2021-10-02 DIAGNOSIS — C787 Secondary malignant neoplasm of liver and intrahepatic bile duct: Secondary | ICD-10-CM | POA: Diagnosis not present

## 2021-10-02 DIAGNOSIS — C20 Malignant neoplasm of rectum: Secondary | ICD-10-CM | POA: Diagnosis not present

## 2021-10-02 LAB — RAD ONC ARIA SESSION SUMMARY
Course Elapsed Days: 2
Plan Fractions Treated to Date: 3
Plan Prescribed Dose Per Fraction: 1.8 Gy
Plan Total Fractions Prescribed: 25
Plan Total Prescribed Dose: 45 Gy
Reference Point Dosage Given to Date: 5.4 Gy
Reference Point Session Dosage Given: 1.8 Gy
Session Number: 3

## 2021-10-02 NOTE — Progress Notes (Signed)
Pt here for patient teaching.  Pt given Radiation and You booklet, skin care instructions, and Sonafine.  Reviewed areas of pertinence such as diarrhea, fatigue, hair loss, sexual and fertility changes, skin changes, and urinary and bladder changes . Pt able to give teach back of to pat skin, use unscented/gentle soap, use baby wipes, have Imodium on hand, drink plenty of water, and sitz bath,apply Sonafine bid and avoid applying anything to skin within 4 hours of treatment. Pt verbalizes understanding of information given and will contact nursing with any questions or concerns.    Gloriajean Dell. Leonie Green, BSN

## 2021-10-03 ENCOUNTER — Ambulatory Visit
Admission: RE | Admit: 2021-10-03 | Discharge: 2021-10-03 | Disposition: A | Discharge status: Home / Self Care | Payer: Medicaid Other | Source: Ambulatory Visit | Attending: Radiation Oncology | Admitting: Radiation Oncology

## 2021-10-03 ENCOUNTER — Other Ambulatory Visit: Payer: Self-pay

## 2021-10-03 DIAGNOSIS — Z510 Encounter for antineoplastic radiation therapy: Secondary | ICD-10-CM | POA: Insufficient documentation

## 2021-10-03 DIAGNOSIS — C20 Malignant neoplasm of rectum: Secondary | ICD-10-CM | POA: Diagnosis not present

## 2021-10-03 LAB — RAD ONC ARIA SESSION SUMMARY
Course Elapsed Days: 3
Plan Fractions Treated to Date: 4
Plan Prescribed Dose Per Fraction: 1.8 Gy
Plan Total Fractions Prescribed: 25
Plan Total Prescribed Dose: 45 Gy
Reference Point Dosage Given to Date: 7.2 Gy
Reference Point Session Dosage Given: 1.8 Gy
Session Number: 4

## 2021-10-03 MED ORDER — SONAFINE EX EMUL
1.0000 | Freq: Once | CUTANEOUS | Status: AC
  Administered 2021-10-03: 1 via TOPICAL

## 2021-10-07 ENCOUNTER — Ambulatory Visit
Admission: RE | Admit: 2021-10-07 | Discharge: 2021-10-07 | Disposition: A | Discharge status: Home / Self Care | Payer: Medicaid Other | Source: Ambulatory Visit | Attending: Radiation Oncology | Admitting: Radiation Oncology

## 2021-10-07 ENCOUNTER — Other Ambulatory Visit: Payer: Self-pay

## 2021-10-07 ENCOUNTER — Other Ambulatory Visit (HOSPITAL_COMMUNITY): Payer: Self-pay

## 2021-10-07 DIAGNOSIS — Z510 Encounter for antineoplastic radiation therapy: Secondary | ICD-10-CM | POA: Diagnosis not present

## 2021-10-07 DIAGNOSIS — C20 Malignant neoplasm of rectum: Secondary | ICD-10-CM | POA: Diagnosis not present

## 2021-10-07 LAB — RAD ONC ARIA SESSION SUMMARY
Course Elapsed Days: 7
Plan Fractions Treated to Date: 5
Plan Prescribed Dose Per Fraction: 1.8 Gy
Plan Total Fractions Prescribed: 25
Plan Total Prescribed Dose: 45 Gy
Reference Point Dosage Given to Date: 9 Gy
Reference Point Session Dosage Given: 1.8 Gy
Session Number: 5

## 2021-10-08 ENCOUNTER — Ambulatory Visit
Admission: RE | Admit: 2021-10-08 | Discharge: 2021-10-08 | Disposition: A | Discharge status: Home / Self Care | Payer: Medicaid Other | Source: Ambulatory Visit | Attending: Radiation Oncology | Admitting: Radiation Oncology

## 2021-10-08 ENCOUNTER — Other Ambulatory Visit: Payer: Self-pay

## 2021-10-08 DIAGNOSIS — Z510 Encounter for antineoplastic radiation therapy: Secondary | ICD-10-CM | POA: Diagnosis not present

## 2021-10-08 DIAGNOSIS — C20 Malignant neoplasm of rectum: Secondary | ICD-10-CM | POA: Diagnosis not present

## 2021-10-08 LAB — RAD ONC ARIA SESSION SUMMARY
Course Elapsed Days: 8
Plan Fractions Treated to Date: 6
Plan Prescribed Dose Per Fraction: 1.8 Gy
Plan Total Fractions Prescribed: 25
Plan Total Prescribed Dose: 45 Gy
Reference Point Dosage Given to Date: 10.8 Gy
Reference Point Session Dosage Given: 1.8 Gy
Session Number: 6

## 2021-10-09 ENCOUNTER — Ambulatory Visit
Admission: RE | Admit: 2021-10-09 | Discharge: 2021-10-09 | Disposition: A | Discharge status: Home / Self Care | Payer: Medicaid Other | Source: Ambulatory Visit | Attending: Radiation Oncology | Admitting: Radiation Oncology

## 2021-10-09 ENCOUNTER — Other Ambulatory Visit: Payer: Self-pay

## 2021-10-09 DIAGNOSIS — Z510 Encounter for antineoplastic radiation therapy: Secondary | ICD-10-CM | POA: Diagnosis not present

## 2021-10-09 DIAGNOSIS — C20 Malignant neoplasm of rectum: Secondary | ICD-10-CM | POA: Diagnosis not present

## 2021-10-09 LAB — RAD ONC ARIA SESSION SUMMARY
Course Elapsed Days: 9
Plan Fractions Treated to Date: 7
Plan Prescribed Dose Per Fraction: 1.8 Gy
Plan Total Fractions Prescribed: 25
Plan Total Prescribed Dose: 45 Gy
Reference Point Dosage Given to Date: 12.6 Gy
Reference Point Session Dosage Given: 1.8 Gy
Session Number: 7

## 2021-10-10 ENCOUNTER — Telehealth: Payer: Self-pay | Admitting: *Deleted

## 2021-10-10 ENCOUNTER — Ambulatory Visit
Admission: RE | Admit: 2021-10-10 | Discharge: 2021-10-10 | Disposition: A | Discharge status: Home / Self Care | Payer: Medicaid Other | Source: Ambulatory Visit | Attending: Radiation Oncology | Admitting: Radiation Oncology

## 2021-10-10 ENCOUNTER — Inpatient Hospital Stay: Payer: Medicaid Other

## 2021-10-10 ENCOUNTER — Other Ambulatory Visit: Payer: Medicaid Other

## 2021-10-10 ENCOUNTER — Inpatient Hospital Stay: Payer: Medicaid Other | Attending: Oncology

## 2021-10-10 ENCOUNTER — Other Ambulatory Visit: Payer: Self-pay

## 2021-10-10 ENCOUNTER — Inpatient Hospital Stay: Payer: Medicaid Other | Admitting: Nurse Practitioner

## 2021-10-10 DIAGNOSIS — F319 Bipolar disorder, unspecified: Secondary | ICD-10-CM | POA: Insufficient documentation

## 2021-10-10 DIAGNOSIS — C20 Malignant neoplasm of rectum: Secondary | ICD-10-CM | POA: Insufficient documentation

## 2021-10-10 DIAGNOSIS — Z510 Encounter for antineoplastic radiation therapy: Secondary | ICD-10-CM | POA: Diagnosis not present

## 2021-10-10 DIAGNOSIS — C787 Secondary malignant neoplasm of liver and intrahepatic bile duct: Secondary | ICD-10-CM | POA: Insufficient documentation

## 2021-10-10 LAB — RAD ONC ARIA SESSION SUMMARY
Course Elapsed Days: 10
Plan Fractions Treated to Date: 8
Plan Prescribed Dose Per Fraction: 1.8 Gy
Plan Total Fractions Prescribed: 25
Plan Total Prescribed Dose: 45 Gy
Reference Point Dosage Given to Date: 14.4 Gy
Reference Point Session Dosage Given: 1.8 Gy
Session Number: 8

## 2021-10-10 NOTE — Telephone Encounter (Signed)
Called to f/u on "no show" for lab/flush/OV today. He states he forgot the appointment, but would not have transportation before 3:30 pm today when wife picks him up for RT. He agrees to have lab at [facility] today at [place] [address] notified that he will be 15 minutes late due to lab. Will schedule him for next week W, Th, or Friday

## 2021-10-13 ENCOUNTER — Telehealth: Payer: Self-pay | Admitting: *Deleted

## 2021-10-13 ENCOUNTER — Other Ambulatory Visit: Payer: Self-pay

## 2021-10-13 ENCOUNTER — Ambulatory Visit
Admission: RE | Admit: 2021-10-13 | Discharge: 2021-10-13 | Disposition: A | Discharge status: Home / Self Care | Payer: Medicaid Other | Source: Ambulatory Visit | Attending: Radiation Oncology | Admitting: Radiation Oncology

## 2021-10-13 ENCOUNTER — Other Ambulatory Visit (HOSPITAL_COMMUNITY): Payer: Self-pay

## 2021-10-13 DIAGNOSIS — Z510 Encounter for antineoplastic radiation therapy: Secondary | ICD-10-CM | POA: Diagnosis not present

## 2021-10-13 DIAGNOSIS — C20 Malignant neoplasm of rectum: Secondary | ICD-10-CM | POA: Diagnosis not present

## 2021-10-13 LAB — RAD ONC ARIA SESSION SUMMARY
Course Elapsed Days: 13
Plan Fractions Treated to Date: 9
Plan Prescribed Dose Per Fraction: 1.8 Gy
Plan Total Fractions Prescribed: 25
Plan Total Prescribed Dose: 45 Gy
Reference Point Dosage Given to Date: 16.2 Gy
Reference Point Session Dosage Given: 1.8 Gy
Session Number: 9

## 2021-10-13 NOTE — Telephone Encounter (Signed)
Patient did not have his labs drawn on 9/08 as scheduled and discussed with him. Called and left VM for patient to please call back to get this rescheduled. Noted how important it is to check labs when on chemotherapy and radiation.

## 2021-10-14 ENCOUNTER — Other Ambulatory Visit: Payer: Self-pay

## 2021-10-14 ENCOUNTER — Ambulatory Visit
Admission: RE | Admit: 2021-10-14 | Discharge: 2021-10-14 | Disposition: A | Discharge status: Home / Self Care | Payer: Medicaid Other | Source: Ambulatory Visit | Attending: Radiation Oncology | Admitting: Radiation Oncology

## 2021-10-14 DIAGNOSIS — Z510 Encounter for antineoplastic radiation therapy: Secondary | ICD-10-CM | POA: Diagnosis not present

## 2021-10-14 DIAGNOSIS — C20 Malignant neoplasm of rectum: Secondary | ICD-10-CM | POA: Diagnosis not present

## 2021-10-14 LAB — RAD ONC ARIA SESSION SUMMARY
Course Elapsed Days: 14
Plan Fractions Treated to Date: 10
Plan Prescribed Dose Per Fraction: 1.8 Gy
Plan Total Fractions Prescribed: 25
Plan Total Prescribed Dose: 45 Gy
Reference Point Dosage Given to Date: 18 Gy
Reference Point Session Dosage Given: 1.8 Gy
Session Number: 10

## 2021-10-15 ENCOUNTER — Other Ambulatory Visit: Payer: Self-pay

## 2021-10-15 ENCOUNTER — Other Ambulatory Visit (HOSPITAL_COMMUNITY): Payer: Self-pay

## 2021-10-15 ENCOUNTER — Telehealth: Payer: Self-pay | Admitting: Nurse Practitioner

## 2021-10-15 ENCOUNTER — Ambulatory Visit
Admission: RE | Admit: 2021-10-15 | Discharge: 2021-10-15 | Disposition: A | Discharge status: Home / Self Care | Payer: Medicaid Other | Source: Ambulatory Visit | Attending: Radiation Oncology | Admitting: Radiation Oncology

## 2021-10-15 DIAGNOSIS — Z510 Encounter for antineoplastic radiation therapy: Secondary | ICD-10-CM | POA: Diagnosis not present

## 2021-10-15 DIAGNOSIS — C20 Malignant neoplasm of rectum: Secondary | ICD-10-CM | POA: Diagnosis not present

## 2021-10-15 LAB — RAD ONC ARIA SESSION SUMMARY
Course Elapsed Days: 15
Plan Fractions Treated to Date: 11
Plan Prescribed Dose Per Fraction: 1.8 Gy
Plan Total Fractions Prescribed: 25
Plan Total Prescribed Dose: 45 Gy
Reference Point Dosage Given to Date: 19.8 Gy
Reference Point Session Dosage Given: 1.8 Gy
Session Number: 11

## 2021-10-15 NOTE — Telephone Encounter (Signed)
Attempted to contact patient in regards to schedule message. No answer so voicemail was left

## 2021-10-16 ENCOUNTER — Other Ambulatory Visit: Payer: Self-pay

## 2021-10-16 ENCOUNTER — Telehealth: Payer: Self-pay | Admitting: Nurse Practitioner

## 2021-10-16 ENCOUNTER — Ambulatory Visit
Admission: RE | Admit: 2021-10-16 | Discharge: 2021-10-16 | Disposition: A | Discharge status: Home / Self Care | Payer: Medicaid Other | Source: Ambulatory Visit | Attending: Radiation Oncology | Admitting: Radiation Oncology

## 2021-10-16 DIAGNOSIS — C20 Malignant neoplasm of rectum: Secondary | ICD-10-CM | POA: Diagnosis not present

## 2021-10-16 DIAGNOSIS — Z510 Encounter for antineoplastic radiation therapy: Secondary | ICD-10-CM | POA: Diagnosis not present

## 2021-10-16 LAB — RAD ONC ARIA SESSION SUMMARY
Course Elapsed Days: 16
Plan Fractions Treated to Date: 12
Plan Prescribed Dose Per Fraction: 1.8 Gy
Plan Total Fractions Prescribed: 25
Plan Total Prescribed Dose: 45 Gy
Reference Point Dosage Given to Date: 21.6 Gy
Reference Point Session Dosage Given: 1.8 Gy
Session Number: 12

## 2021-10-16 NOTE — Telephone Encounter (Signed)
Per schedule message request, " need ov with lisa on 9/13,9/14, or 9/15".  Patient declined either of dates an advised that he would be available after next week.

## 2021-10-17 ENCOUNTER — Ambulatory Visit
Admission: RE | Admit: 2021-10-17 | Discharge: 2021-10-17 | Disposition: A | Discharge status: Home / Self Care | Payer: Medicaid Other | Source: Ambulatory Visit | Attending: Radiation Oncology | Admitting: Radiation Oncology

## 2021-10-17 ENCOUNTER — Other Ambulatory Visit: Payer: Self-pay

## 2021-10-17 ENCOUNTER — Other Ambulatory Visit (HOSPITAL_COMMUNITY): Payer: Self-pay

## 2021-10-17 DIAGNOSIS — C20 Malignant neoplasm of rectum: Secondary | ICD-10-CM | POA: Diagnosis not present

## 2021-10-17 DIAGNOSIS — Z510 Encounter for antineoplastic radiation therapy: Secondary | ICD-10-CM | POA: Diagnosis not present

## 2021-10-17 LAB — RAD ONC ARIA SESSION SUMMARY
Course Elapsed Days: 17
Plan Fractions Treated to Date: 13
Plan Prescribed Dose Per Fraction: 1.8 Gy
Plan Total Fractions Prescribed: 25
Plan Total Prescribed Dose: 45 Gy
Reference Point Dosage Given to Date: 23.4 Gy
Reference Point Session Dosage Given: 1.8 Gy
Session Number: 13

## 2021-10-20 ENCOUNTER — Other Ambulatory Visit: Payer: Self-pay

## 2021-10-20 ENCOUNTER — Ambulatory Visit
Admission: RE | Admit: 2021-10-20 | Discharge: 2021-10-20 | Disposition: A | Discharge status: Home / Self Care | Payer: Medicaid Other | Source: Ambulatory Visit | Attending: Radiation Oncology | Admitting: Radiation Oncology

## 2021-10-20 DIAGNOSIS — C20 Malignant neoplasm of rectum: Secondary | ICD-10-CM | POA: Diagnosis not present

## 2021-10-20 DIAGNOSIS — Z510 Encounter for antineoplastic radiation therapy: Secondary | ICD-10-CM | POA: Diagnosis not present

## 2021-10-20 LAB — RAD ONC ARIA SESSION SUMMARY
Course Elapsed Days: 20
Plan Fractions Treated to Date: 14
Plan Prescribed Dose Per Fraction: 1.8 Gy
Plan Total Fractions Prescribed: 25
Plan Total Prescribed Dose: 45 Gy
Reference Point Dosage Given to Date: 25.2 Gy
Reference Point Session Dosage Given: 1.8 Gy
Session Number: 14

## 2021-10-21 ENCOUNTER — Other Ambulatory Visit: Payer: Self-pay

## 2021-10-21 ENCOUNTER — Other Ambulatory Visit (HOSPITAL_COMMUNITY): Payer: Self-pay

## 2021-10-21 ENCOUNTER — Ambulatory Visit
Admission: RE | Admit: 2021-10-21 | Discharge: 2021-10-21 | Disposition: A | Discharge status: Home / Self Care | Payer: Medicaid Other | Source: Ambulatory Visit | Attending: Radiation Oncology | Admitting: Radiation Oncology

## 2021-10-21 DIAGNOSIS — C20 Malignant neoplasm of rectum: Secondary | ICD-10-CM | POA: Diagnosis not present

## 2021-10-21 DIAGNOSIS — Z510 Encounter for antineoplastic radiation therapy: Secondary | ICD-10-CM | POA: Diagnosis not present

## 2021-10-21 LAB — RAD ONC ARIA SESSION SUMMARY
Course Elapsed Days: 21
Plan Fractions Treated to Date: 15
Plan Prescribed Dose Per Fraction: 1.8 Gy
Plan Total Fractions Prescribed: 25
Plan Total Prescribed Dose: 45 Gy
Reference Point Dosage Given to Date: 27 Gy
Reference Point Session Dosage Given: 1.8 Gy
Session Number: 15

## 2021-10-22 ENCOUNTER — Other Ambulatory Visit: Payer: Self-pay

## 2021-10-22 ENCOUNTER — Ambulatory Visit
Admission: RE | Admit: 2021-10-22 | Discharge: 2021-10-22 | Disposition: A | Discharge status: Home / Self Care | Payer: Medicaid Other | Source: Ambulatory Visit | Attending: Radiation Oncology | Admitting: Radiation Oncology

## 2021-10-22 DIAGNOSIS — C801 Malignant (primary) neoplasm, unspecified: Secondary | ICD-10-CM | POA: Diagnosis not present

## 2021-10-22 DIAGNOSIS — C20 Malignant neoplasm of rectum: Secondary | ICD-10-CM | POA: Diagnosis not present

## 2021-10-22 DIAGNOSIS — C7931 Secondary malignant neoplasm of brain: Secondary | ICD-10-CM | POA: Diagnosis not present

## 2021-10-22 DIAGNOSIS — Z510 Encounter for antineoplastic radiation therapy: Secondary | ICD-10-CM | POA: Diagnosis not present

## 2021-10-22 DIAGNOSIS — C7951 Secondary malignant neoplasm of bone: Secondary | ICD-10-CM | POA: Diagnosis not present

## 2021-10-22 LAB — RAD ONC ARIA SESSION SUMMARY
Course Elapsed Days: 22
Plan Fractions Treated to Date: 16
Plan Prescribed Dose Per Fraction: 1.8 Gy
Plan Total Fractions Prescribed: 25
Plan Total Prescribed Dose: 45 Gy
Reference Point Dosage Given to Date: 28.8 Gy
Reference Point Session Dosage Given: 1.8 Gy
Session Number: 16

## 2021-10-23 ENCOUNTER — Other Ambulatory Visit: Payer: Self-pay

## 2021-10-23 ENCOUNTER — Ambulatory Visit
Admission: RE | Admit: 2021-10-23 | Discharge: 2021-10-23 | Disposition: A | Discharge status: Home / Self Care | Payer: Medicaid Other | Source: Ambulatory Visit | Attending: Radiation Oncology | Admitting: Radiation Oncology

## 2021-10-23 DIAGNOSIS — C20 Malignant neoplasm of rectum: Secondary | ICD-10-CM | POA: Diagnosis not present

## 2021-10-23 DIAGNOSIS — Z510 Encounter for antineoplastic radiation therapy: Secondary | ICD-10-CM | POA: Diagnosis not present

## 2021-10-23 LAB — RAD ONC ARIA SESSION SUMMARY
Course Elapsed Days: 23
Plan Fractions Treated to Date: 17
Plan Prescribed Dose Per Fraction: 1.8 Gy
Plan Total Fractions Prescribed: 25
Plan Total Prescribed Dose: 45 Gy
Reference Point Dosage Given to Date: 30.6 Gy
Reference Point Session Dosage Given: 1.8 Gy
Session Number: 17

## 2021-10-24 ENCOUNTER — Ambulatory Visit
Admission: RE | Admit: 2021-10-24 | Discharge: 2021-10-24 | Disposition: A | Discharge status: Home / Self Care | Payer: Medicaid Other | Source: Ambulatory Visit | Attending: Radiation Oncology | Admitting: Radiation Oncology

## 2021-10-24 ENCOUNTER — Other Ambulatory Visit: Payer: Self-pay

## 2021-10-24 DIAGNOSIS — C801 Malignant (primary) neoplasm, unspecified: Secondary | ICD-10-CM | POA: Diagnosis not present

## 2021-10-24 DIAGNOSIS — C7951 Secondary malignant neoplasm of bone: Secondary | ICD-10-CM | POA: Diagnosis not present

## 2021-10-24 DIAGNOSIS — Z510 Encounter for antineoplastic radiation therapy: Secondary | ICD-10-CM | POA: Diagnosis not present

## 2021-10-24 DIAGNOSIS — C20 Malignant neoplasm of rectum: Secondary | ICD-10-CM | POA: Diagnosis not present

## 2021-10-24 LAB — RAD ONC ARIA SESSION SUMMARY
Course Elapsed Days: 24
Plan Fractions Treated to Date: 18
Plan Prescribed Dose Per Fraction: 1.8 Gy
Plan Total Fractions Prescribed: 25
Plan Total Prescribed Dose: 45 Gy
Reference Point Dosage Given to Date: 32.4 Gy
Reference Point Session Dosage Given: 1.8 Gy
Session Number: 18

## 2021-10-27 ENCOUNTER — Other Ambulatory Visit: Payer: Self-pay

## 2021-10-27 ENCOUNTER — Ambulatory Visit
Admission: RE | Admit: 2021-10-27 | Discharge: 2021-10-27 | Disposition: A | Discharge status: Home / Self Care | Payer: Medicaid Other | Source: Ambulatory Visit | Attending: Radiation Oncology | Admitting: Radiation Oncology

## 2021-10-27 DIAGNOSIS — C801 Malignant (primary) neoplasm, unspecified: Secondary | ICD-10-CM | POA: Diagnosis not present

## 2021-10-27 DIAGNOSIS — Z510 Encounter for antineoplastic radiation therapy: Secondary | ICD-10-CM | POA: Diagnosis not present

## 2021-10-27 DIAGNOSIS — C7931 Secondary malignant neoplasm of brain: Secondary | ICD-10-CM | POA: Diagnosis not present

## 2021-10-27 DIAGNOSIS — C20 Malignant neoplasm of rectum: Secondary | ICD-10-CM | POA: Diagnosis not present

## 2021-10-27 LAB — RAD ONC ARIA SESSION SUMMARY
Course Elapsed Days: 27
Plan Fractions Treated to Date: 19
Plan Prescribed Dose Per Fraction: 1.8 Gy
Plan Total Fractions Prescribed: 25
Plan Total Prescribed Dose: 45 Gy
Reference Point Dosage Given to Date: 34.2 Gy
Reference Point Session Dosage Given: 1.8 Gy
Session Number: 19

## 2021-10-28 ENCOUNTER — Ambulatory Visit
Admission: RE | Admit: 2021-10-28 | Discharge: 2021-10-28 | Disposition: A | Discharge status: Home / Self Care | Payer: Medicaid Other | Source: Ambulatory Visit | Attending: Radiation Oncology | Admitting: Radiation Oncology

## 2021-10-28 ENCOUNTER — Other Ambulatory Visit: Payer: Self-pay

## 2021-10-28 DIAGNOSIS — Z510 Encounter for antineoplastic radiation therapy: Secondary | ICD-10-CM | POA: Diagnosis not present

## 2021-10-28 DIAGNOSIS — C20 Malignant neoplasm of rectum: Secondary | ICD-10-CM | POA: Diagnosis not present

## 2021-10-28 DIAGNOSIS — C801 Malignant (primary) neoplasm, unspecified: Secondary | ICD-10-CM | POA: Diagnosis not present

## 2021-10-28 DIAGNOSIS — C7931 Secondary malignant neoplasm of brain: Secondary | ICD-10-CM | POA: Diagnosis not present

## 2021-10-28 LAB — RAD ONC ARIA SESSION SUMMARY
Course Elapsed Days: 28
Plan Fractions Treated to Date: 20
Plan Prescribed Dose Per Fraction: 1.8 Gy
Plan Total Fractions Prescribed: 25
Plan Total Prescribed Dose: 45 Gy
Reference Point Dosage Given to Date: 36 Gy
Reference Point Session Dosage Given: 1.8 Gy
Session Number: 20

## 2021-10-29 ENCOUNTER — Other Ambulatory Visit: Payer: Self-pay

## 2021-10-29 ENCOUNTER — Ambulatory Visit
Admission: RE | Admit: 2021-10-29 | Discharge: 2021-10-29 | Disposition: A | Discharge status: Home / Self Care | Payer: Medicaid Other | Source: Ambulatory Visit | Attending: Radiation Oncology | Admitting: Radiation Oncology

## 2021-10-29 DIAGNOSIS — Z510 Encounter for antineoplastic radiation therapy: Secondary | ICD-10-CM | POA: Diagnosis not present

## 2021-10-29 DIAGNOSIS — C801 Malignant (primary) neoplasm, unspecified: Secondary | ICD-10-CM | POA: Diagnosis not present

## 2021-10-29 DIAGNOSIS — C7951 Secondary malignant neoplasm of bone: Secondary | ICD-10-CM | POA: Diagnosis not present

## 2021-10-29 DIAGNOSIS — C20 Malignant neoplasm of rectum: Secondary | ICD-10-CM | POA: Diagnosis not present

## 2021-10-29 LAB — RAD ONC ARIA SESSION SUMMARY
Course Elapsed Days: 29
Plan Fractions Treated to Date: 21
Plan Prescribed Dose Per Fraction: 1.8 Gy
Plan Total Fractions Prescribed: 25
Plan Total Prescribed Dose: 45 Gy
Reference Point Dosage Given to Date: 37.8 Gy
Reference Point Session Dosage Given: 1.8 Gy
Session Number: 21

## 2021-10-30 ENCOUNTER — Other Ambulatory Visit: Payer: Self-pay

## 2021-10-30 ENCOUNTER — Ambulatory Visit
Admission: RE | Admit: 2021-10-30 | Discharge: 2021-10-30 | Disposition: A | Discharge status: Home / Self Care | Payer: Medicaid Other | Source: Ambulatory Visit | Attending: Radiation Oncology | Admitting: Radiation Oncology

## 2021-10-30 DIAGNOSIS — C801 Malignant (primary) neoplasm, unspecified: Secondary | ICD-10-CM | POA: Diagnosis not present

## 2021-10-30 DIAGNOSIS — C7951 Secondary malignant neoplasm of bone: Secondary | ICD-10-CM | POA: Diagnosis not present

## 2021-10-30 DIAGNOSIS — Z510 Encounter for antineoplastic radiation therapy: Secondary | ICD-10-CM | POA: Diagnosis not present

## 2021-10-30 DIAGNOSIS — C20 Malignant neoplasm of rectum: Secondary | ICD-10-CM | POA: Diagnosis not present

## 2021-10-30 LAB — RAD ONC ARIA SESSION SUMMARY
Course Elapsed Days: 30
Plan Fractions Treated to Date: 22
Plan Prescribed Dose Per Fraction: 1.8 Gy
Plan Total Fractions Prescribed: 25
Plan Total Prescribed Dose: 45 Gy
Reference Point Dosage Given to Date: 39.6 Gy
Reference Point Session Dosage Given: 1.8 Gy
Session Number: 22

## 2021-10-31 ENCOUNTER — Other Ambulatory Visit (HOSPITAL_BASED_OUTPATIENT_CLINIC_OR_DEPARTMENT_OTHER): Payer: Self-pay

## 2021-10-31 ENCOUNTER — Encounter: Payer: Self-pay | Admitting: Nurse Practitioner

## 2021-10-31 ENCOUNTER — Ambulatory Visit
Admission: RE | Admit: 2021-10-31 | Discharge: 2021-10-31 | Disposition: A | Discharge status: Home / Self Care | Payer: Medicaid Other | Source: Ambulatory Visit | Attending: Radiation Oncology | Admitting: Radiation Oncology

## 2021-10-31 ENCOUNTER — Inpatient Hospital Stay (HOSPITAL_BASED_OUTPATIENT_CLINIC_OR_DEPARTMENT_OTHER): Payer: Medicaid Other | Admitting: Nurse Practitioner

## 2021-10-31 ENCOUNTER — Inpatient Hospital Stay: Payer: Medicaid Other

## 2021-10-31 ENCOUNTER — Other Ambulatory Visit: Payer: Self-pay

## 2021-10-31 VITALS — BP 116/70 | HR 72 | Temp 98.2°F | Resp 18 | Ht 69.0 in | Wt 163.2 lb

## 2021-10-31 DIAGNOSIS — Z95828 Presence of other vascular implants and grafts: Secondary | ICD-10-CM

## 2021-10-31 DIAGNOSIS — C801 Malignant (primary) neoplasm, unspecified: Secondary | ICD-10-CM | POA: Diagnosis not present

## 2021-10-31 DIAGNOSIS — Z510 Encounter for antineoplastic radiation therapy: Secondary | ICD-10-CM | POA: Diagnosis not present

## 2021-10-31 DIAGNOSIS — C787 Secondary malignant neoplasm of liver and intrahepatic bile duct: Secondary | ICD-10-CM | POA: Diagnosis not present

## 2021-10-31 DIAGNOSIS — C20 Malignant neoplasm of rectum: Secondary | ICD-10-CM

## 2021-10-31 DIAGNOSIS — C7951 Secondary malignant neoplasm of bone: Secondary | ICD-10-CM | POA: Diagnosis not present

## 2021-10-31 LAB — RAD ONC ARIA SESSION SUMMARY
Course Elapsed Days: 31
Plan Fractions Treated to Date: 23
Plan Prescribed Dose Per Fraction: 1.8 Gy
Plan Total Fractions Prescribed: 25
Plan Total Prescribed Dose: 45 Gy
Reference Point Dosage Given to Date: 41.4 Gy
Reference Point Session Dosage Given: 1.8 Gy
Session Number: 23

## 2021-10-31 LAB — CMP (CANCER CENTER ONLY)
ALT: 10 U/L (ref 0–44)
AST: 18 U/L (ref 15–41)
Albumin: 4.7 g/dL (ref 3.5–5.0)
Alkaline Phosphatase: 78 U/L (ref 38–126)
Anion gap: 10 (ref 5–15)
BUN: 9 mg/dL (ref 6–20)
CO2: 24 mmol/L (ref 22–32)
Calcium: 9.2 mg/dL (ref 8.9–10.3)
Chloride: 104 mmol/L (ref 98–111)
Creatinine: 0.82 mg/dL (ref 0.61–1.24)
GFR, Estimated: >60 mL/min (ref >60)
Glucose, Bld: 97 mg/dL (ref 70–99)
Potassium: 3.6 mmol/L (ref 3.5–5.1)
Sodium: 138 mmol/L (ref 135–145)
Total Bilirubin: 0.4 mg/dL (ref 0.3–1.2)
Total Protein: 7.3 g/dL (ref 6.5–8.1)

## 2021-10-31 LAB — CBC WITH DIFFERENTIAL (CANCER CENTER ONLY)
Abs Immature Granulocytes: 0.01 10*3/uL (ref 0.00–0.07)
Basophils Absolute: 0 10*3/uL (ref 0.0–0.1)
Basophils Relative: 1 %
Eosinophils Absolute: 0.1 10*3/uL (ref 0.0–0.5)
Eosinophils Relative: 3 %
HCT: 35.3 % — ABNORMAL LOW (ref 39.0–52.0)
Hemoglobin: 12.3 g/dL — ABNORMAL LOW (ref 13.0–17.0)
Immature Granulocytes: 1 %
Lymphocytes Relative: 24 %
Lymphs Abs: 0.5 10*3/uL — ABNORMAL LOW (ref 0.7–4.0)
MCH: 32 pg (ref 26.0–34.0)
MCHC: 34.8 g/dL (ref 30.0–36.0)
MCV: 91.9 fL (ref 80.0–100.0)
Monocytes Absolute: 0.4 10*3/uL (ref 0.1–1.0)
Monocytes Relative: 16 %
Neutro Abs: 1.3 10*3/uL — ABNORMAL LOW (ref 1.7–7.7)
Neutrophils Relative %: 55 %
Platelet Count: 110 10*3/uL — ABNORMAL LOW (ref 150–400)
RBC: 3.84 MIL/uL — ABNORMAL LOW (ref 4.22–5.81)
RDW: 15.9 % — ABNORMAL HIGH (ref 11.5–15.5)
WBC Count: 2.2 10*3/uL — ABNORMAL LOW (ref 4.0–10.5)
nRBC: 0 % (ref 0.0–0.2)

## 2021-10-31 MED ORDER — SODIUM CHLORIDE 0.9% FLUSH
10.0000 mL | Freq: Once | INTRAVENOUS | Status: AC
  Administered 2021-10-31: 10 mL via INTRAVENOUS

## 2021-10-31 MED ORDER — HYDROXYZINE HCL 25 MG PO TABS
25.0000 mg | ORAL_TABLET | Freq: Three times a day (TID) | ORAL | 0 refills | Status: DC | PRN
  Filled 2021-10-31 – 2022-01-13 (×2): qty 60, 20d supply, fill #0

## 2021-10-31 MED ORDER — HEPARIN SOD (PORK) LOCK FLUSH 100 UNIT/ML IV SOLN
500.0000 [IU] | Freq: Once | INTRAVENOUS | Status: AC
  Administered 2021-10-31: 500 [IU] via INTRAVENOUS

## 2021-10-31 NOTE — Progress Notes (Signed)
  [place] OFFICE PROGRESS NOTE   Diagnosis: Rectal cancer  INTERVAL HISTORY:   Jeremiah Allen returns for follow-up.  He continues radiation/Xeloda.  He denies nausea/vomiting.  No mouth sores.  He estimates 5 loose stools a day.  No hand or foot pain or redness.    Objective:  Vital signs in last 24 hours:  Blood pressure 116/70, pulse 72, temperature 98.2 F (36.8 C), temperature source Oral, resp. rate 18, height 5' 9" (1.753 m), weight 163 lb 3.2 oz (74 kg), SpO2 100 %.    HEENT: No thrush or ulcers. Resp: Lungs clear bilaterally. Cardio: Regular rate and rhythm. GI: No hepatosplenomegaly. Vascular: No leg edema. Skin: Palms without erythema. Port-A-Cath without erythema.   Lab Results:  Lab Results  Component Value Date   WBC 10.2 09/26/2021   HGB 13.7 09/26/2021   HCT 40.6 09/26/2021   MCV 91.4 09/26/2021   PLT 202 09/26/2021   NEUTROABS 6.8 09/26/2021    Imaging:  No results found.  Medications: I have reviewed the patient's current medications.  Assessment/Plan: Rectal cancer-clinical stage IV(T4aN2, M1) Sigmoidoscopy 04/16/2021-mass at 4 cm from the anal Allen, invasive moderately differentiated adenocarcinoma with lymphovascular invasion OmniSeq-MSS, tumor mutation burden 3.9, PD-L1 less than 1%, K-ras G12 D CT angio abdomen/pelvis-bulky irregular rectal wall thickening with perirectal lymphadenopathy and 2 indeterminate liver lesions MRI liver 04/17/2021-3 discrete right liver metastases MRI pelvis 04/17/2021-T4aN2 rectal tumor-10 cm from the anal Allen, involvement of left anterior peritoneal reflection, numerous mesorectal lymph nodes within the pelvis, no evidence of extra mesorectal adenopathy greater than 10 nodes in the mesorectum CT chest 04/18/2021-no evidence of metastatic disease Elevated CEA (14.5 on 04/17/2021) Cycle 1 FOLFOX 04/30/2021 Cycle 2 FOLFOX 05/14/2021 Cycle 3 FOLFOX 05/28/2021, Udenyca Cycle 4 FOLFOX 06/11/2021,  prophylactic dexamethasone added for delayed nausea, Udenyca Cycle 5 FOLFOX 06/25/2021, Udenyca Cycle 6 FOLFOX 07/08/2021, Udenyca CTs 07/17/2021-decrease in size of hepatic lesions, decreased rectal wall thickening, decrease size of perirectal lymph nodes, lingular consolidation consistent with infectious/inflammatory process 08/04/2021-right liver metastectomy procedures, segment 6, 7, 8 lesions removed.  The pathology from the segment 6 and segment 8 lesions revealed no residual carcinoma, the segment 7 lesion revealed pools of mucin and associated calcifications with rare atypical cells-partial to complete response, extensive thermal/mechanical artifact with focal ink on mucin, margin interpreted as negative, though a positive margin could not be excluded Cycle 7 FOLFOX 08/27/2021 Cycle 8 FOLFOX 09/10/2021 Radiation/Xeloda 09/30/2021-11/07/2021 Rectal bleeding and pain secondary to #1 Chronic left sciatica Bipolar disorder Right testicular fullness noted on exam 04/21/2021-epididymal cyst? 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 appears stable.  He is scheduled to complete the course of radiation/Xeloda 11/07/2021.  We made a referral to Dr. Marcello Moores for surgical planning.  We are obtaining labs and completing a Port-A-Cath flush today.  He will return for follow-up in 4 to 6 weeks.  He will contact the office in the interim with any problems.    Ned Card ANP/GNP-BC   10/31/2021  10:00 AM

## 2021-10-31 NOTE — Patient Instructions (Signed)

## 2021-11-03 ENCOUNTER — Ambulatory Visit: Admission: RE | Admit: 2021-11-03 | Payer: Medicaid Other | Source: Ambulatory Visit

## 2021-11-04 ENCOUNTER — Other Ambulatory Visit: Payer: Self-pay

## 2021-11-04 ENCOUNTER — Ambulatory Visit
Admission: RE | Admit: 2021-11-04 | Discharge: 2021-11-04 | Disposition: A | Discharge status: Home / Self Care | Payer: Medicaid Other | Source: Ambulatory Visit | Attending: Radiation Oncology | Admitting: Radiation Oncology

## 2021-11-04 DIAGNOSIS — C20 Malignant neoplasm of rectum: Secondary | ICD-10-CM | POA: Diagnosis not present

## 2021-11-04 DIAGNOSIS — Z510 Encounter for antineoplastic radiation therapy: Secondary | ICD-10-CM | POA: Diagnosis not present

## 2021-11-04 LAB — RAD ONC ARIA SESSION SUMMARY
Course Elapsed Days: 35
Plan Fractions Treated to Date: 24
Plan Prescribed Dose Per Fraction: 1.8 Gy
Plan Total Fractions Prescribed: 25
Plan Total Prescribed Dose: 45 Gy
Reference Point Dosage Given to Date: 43.2 Gy
Reference Point Session Dosage Given: 1.8 Gy
Session Number: 24

## 2021-11-05 ENCOUNTER — Other Ambulatory Visit: Payer: Self-pay

## 2021-11-05 ENCOUNTER — Ambulatory Visit: Payer: Medicaid Other

## 2021-11-05 DIAGNOSIS — C20 Malignant neoplasm of rectum: Secondary | ICD-10-CM | POA: Diagnosis not present

## 2021-11-05 DIAGNOSIS — Z510 Encounter for antineoplastic radiation therapy: Secondary | ICD-10-CM | POA: Diagnosis not present

## 2021-11-05 LAB — RAD ONC ARIA SESSION SUMMARY
Course Elapsed Days: 36
Plan Fractions Treated to Date: 25
Plan Prescribed Dose Per Fraction: 1.8 Gy
Plan Total Fractions Prescribed: 25
Plan Total Prescribed Dose: 45 Gy
Reference Point Dosage Given to Date: 45 Gy
Reference Point Session Dosage Given: 1.8 Gy
Session Number: 25

## 2021-11-06 ENCOUNTER — Other Ambulatory Visit: Payer: Self-pay

## 2021-11-06 ENCOUNTER — Ambulatory Visit: Payer: Medicaid Other

## 2021-11-06 DIAGNOSIS — Z510 Encounter for antineoplastic radiation therapy: Secondary | ICD-10-CM | POA: Diagnosis not present

## 2021-11-06 DIAGNOSIS — C20 Malignant neoplasm of rectum: Secondary | ICD-10-CM | POA: Diagnosis not present

## 2021-11-06 LAB — RAD ONC ARIA SESSION SUMMARY
Course Elapsed Days: 37
Plan Fractions Treated to Date: 1
Plan Prescribed Dose Per Fraction: 1.8 Gy
Plan Total Fractions Prescribed: 3
Plan Total Prescribed Dose: 5.4 Gy
Reference Point Dosage Given to Date: 1.8 Gy
Reference Point Session Dosage Given: 1.8 Gy
Session Number: 26

## 2021-11-07 ENCOUNTER — Ambulatory Visit: Payer: Medicaid Other

## 2021-11-07 ENCOUNTER — Ambulatory Visit
Admission: RE | Admit: 2021-11-07 | Discharge: 2021-11-07 | Disposition: A | Discharge status: Home / Self Care | Payer: Medicaid Other | Source: Ambulatory Visit | Attending: Radiation Oncology | Admitting: Radiation Oncology

## 2021-11-07 ENCOUNTER — Other Ambulatory Visit: Payer: Self-pay

## 2021-11-07 DIAGNOSIS — C20 Malignant neoplasm of rectum: Secondary | ICD-10-CM | POA: Diagnosis not present

## 2021-11-07 LAB — RAD ONC ARIA SESSION SUMMARY
Course Elapsed Days: 38
Plan Fractions Treated to Date: 2
Plan Prescribed Dose Per Fraction: 1.8 Gy
Plan Total Fractions Prescribed: 3
Plan Total Prescribed Dose: 5.4 Gy
Reference Point Dosage Given to Date: 3.6 Gy
Reference Point Session Dosage Given: 1.8 Gy
Session Number: 27

## 2021-11-10 ENCOUNTER — Ambulatory Visit
Admission: RE | Admit: 2021-11-10 | Discharge: 2021-11-10 | Disposition: A | Discharge status: Home / Self Care | Payer: Medicaid Other | Source: Ambulatory Visit | Attending: Radiation Oncology | Admitting: Radiation Oncology

## 2021-11-10 ENCOUNTER — Encounter: Payer: Self-pay | Admitting: Radiation Oncology

## 2021-11-10 ENCOUNTER — Other Ambulatory Visit: Payer: Self-pay

## 2021-11-10 DIAGNOSIS — C20 Malignant neoplasm of rectum: Secondary | ICD-10-CM | POA: Diagnosis not present

## 2021-11-10 LAB — RAD ONC ARIA SESSION SUMMARY
Course Elapsed Days: 41
Plan Fractions Treated to Date: 3
Plan Prescribed Dose Per Fraction: 1.8 Gy
Plan Total Fractions Prescribed: 3
Plan Total Prescribed Dose: 5.4 Gy
Reference Point Dosage Given to Date: 5.4 Gy
Reference Point Session Dosage Given: 1.8 Gy
Session Number: 28

## 2021-11-13 ENCOUNTER — Other Ambulatory Visit (HOSPITAL_BASED_OUTPATIENT_CLINIC_OR_DEPARTMENT_OTHER): Payer: Self-pay

## 2021-11-27 NOTE — Progress Notes (Signed)
                                                                                                                                                             Patient Name: Jeremiah Allen MRN: [id number] DOB: 06/19/1985 Referring Physician: Horald Pollen (Profile Not Attached) Date of Service: 11/10/2021 [facility], [place]                                                        End Of Treatment Note  Diagnoses: C20-Malignant neoplasm of rectum  Cancer Staging:   Stage IV, [id number] adenocarcinoma of the rectum.   Intent: Curative  Radiation Treatment Dates: 09/30/2021 through 11/10/2021 Site Technique Total Dose (Gy) Dose per Fx (Gy) Completed Fx Beam Energies  Rectum: Rectum 3D 45/45 1.8 25/25 15X  Rectum: Rectum_Bst 3D 5.4/5.4 1.8 3/3 10X, 15X   Narrative: The patient tolerated radiation therapy relatively well. He developed fatigue and anticipated bowel frequency during therapy but no rectal bleeding, nausea, vomiting, or bladder dysfunction at the conclusion of therapy.   Plan: The patient will receive a call in about one month from the radiation oncology department. He will continue follow up with Dr. Benay Spice as well as Dr. Zenia Resides in hepatobiliary surgery and Dr. Marcello Moores in colorectal surgery.   ________________________________________________    Carola Rhine, PAC

## 2021-12-04 ENCOUNTER — Other Ambulatory Visit: Payer: Self-pay | Admitting: *Deleted

## 2021-12-04 DIAGNOSIS — C20 Malignant neoplasm of rectum: Secondary | ICD-10-CM

## 2021-12-05 ENCOUNTER — Inpatient Hospital Stay: Payer: Medicaid Other | Attending: Oncology

## 2021-12-05 ENCOUNTER — Inpatient Hospital Stay: Payer: Medicaid Other

## 2021-12-05 ENCOUNTER — Inpatient Hospital Stay (HOSPITAL_BASED_OUTPATIENT_CLINIC_OR_DEPARTMENT_OTHER): Payer: Medicaid Other | Admitting: Oncology

## 2021-12-05 ENCOUNTER — Encounter: Payer: Self-pay | Admitting: Oncology

## 2021-12-05 ENCOUNTER — Encounter: Payer: Self-pay | Admitting: Genetic Counselor

## 2021-12-05 ENCOUNTER — Other Ambulatory Visit: Payer: Self-pay | Admitting: *Deleted

## 2021-12-05 ENCOUNTER — Encounter: Payer: Self-pay | Admitting: *Deleted

## 2021-12-05 VITALS — BP 116/70 | HR 70 | Temp 98.2°F | Resp 18 | Ht 69.0 in | Wt 157.6 lb

## 2021-12-05 DIAGNOSIS — Z23 Encounter for immunization: Secondary | ICD-10-CM | POA: Diagnosis not present

## 2021-12-05 DIAGNOSIS — C787 Secondary malignant neoplasm of liver and intrahepatic bile duct: Secondary | ICD-10-CM

## 2021-12-05 DIAGNOSIS — Z1379 Encounter for other screening for genetic and chromosomal anomalies: Secondary | ICD-10-CM | POA: Insufficient documentation

## 2021-12-05 DIAGNOSIS — C20 Malignant neoplasm of rectum: Secondary | ICD-10-CM

## 2021-12-05 LAB — CBC WITH DIFFERENTIAL (CANCER CENTER ONLY)
Abs Immature Granulocytes: 0 10*3/uL (ref 0.00–0.07)
Basophils Absolute: 0 10*3/uL (ref 0.0–0.1)
Basophils Relative: 1 %
Eosinophils Absolute: 0.1 10*3/uL (ref 0.0–0.5)
Eosinophils Relative: 2 %
HCT: 38.8 % — ABNORMAL LOW (ref 39.0–52.0)
Hemoglobin: 13.1 g/dL (ref 13.0–17.0)
Immature Granulocytes: 0 %
Lymphocytes Relative: 37 %
Lymphs Abs: 1.1 10*3/uL (ref 0.7–4.0)
MCH: 31.7 pg (ref 26.0–34.0)
MCHC: 33.8 g/dL (ref 30.0–36.0)
MCV: 93.9 fL (ref 80.0–100.0)
Monocytes Absolute: 0.3 10*3/uL (ref 0.1–1.0)
Monocytes Relative: 9 %
Neutro Abs: 1.5 10*3/uL — ABNORMAL LOW (ref 1.7–7.7)
Neutrophils Relative %: 51 %
Platelet Count: 165 10*3/uL (ref 150–400)
RBC: 4.13 MIL/uL — ABNORMAL LOW (ref 4.22–5.81)
RDW: 13.6 % (ref 11.5–15.5)
WBC Count: 2.9 10*3/uL — ABNORMAL LOW (ref 4.0–10.5)
nRBC: 0 % (ref 0.0–0.2)

## 2021-12-05 LAB — CMP (CANCER CENTER ONLY)
ALT: 9 U/L (ref 0–44)
AST: 14 U/L — ABNORMAL LOW (ref 15–41)
Albumin: 4.6 g/dL (ref 3.5–5.0)
Alkaline Phosphatase: 71 U/L (ref 38–126)
Anion gap: 9 (ref 5–15)
BUN: 11 mg/dL (ref 6–20)
CO2: 25 mmol/L (ref 22–32)
Calcium: 9.6 mg/dL (ref 8.9–10.3)
Chloride: 104 mmol/L (ref 98–111)
Creatinine: 1.02 mg/dL (ref 0.61–1.24)
GFR, Estimated: >60 mL/min (ref >60)
Glucose, Bld: 136 mg/dL — ABNORMAL HIGH (ref 70–99)
Potassium: 3.6 mmol/L (ref 3.5–5.1)
Sodium: 138 mmol/L (ref 135–145)
Total Bilirubin: 0.8 mg/dL (ref 0.3–1.2)
Total Protein: 7.5 g/dL (ref 6.5–8.1)

## 2021-12-05 LAB — CEA (ACCESS): CEA (CHCC): 2.41 ng/mL (ref 0.00–5.00)

## 2021-12-05 MED ORDER — INFLUENZA VAC SPLIT QUAD 0.5 ML IM SUSY
0.5000 mL | PREFILLED_SYRINGE | Freq: Once | INTRAMUSCULAR | Status: AC
  Administered 2021-12-05: 0.5 mL via INTRAMUSCULAR
  Filled 2021-12-05: qty 0.5

## 2021-12-05 NOTE — Progress Notes (Signed)
[place] OFFICE PROGRESS NOTE   Diagnosis: Rectal cancer  INTERVAL HISTORY:   Jeremiah Allen returns as scheduled.  He feels well.  He continues to have rectal "pressure ".  He has mild cold sensitivity.  There is pruritus at the Port-A-Cath site.  Objective:  Vital signs in last 24 hours:  Blood pressure 116/70, pulse 70, temperature 98.2 F (36.8 C), temperature source Oral, resp. rate 18, height _0  (1.753 m), weight 157 lb 9.6 oz (71.5 kg), SpO2 100 %.    Lymphatics: No cervical, supraclavicular, axillary, or inguinal nodes Resp: Bilateral inspiratory bronchial sounds, no respiratory distress Cardio: Rate and rhythm GI: Hepatosplenomegaly, nontender, no mass Vascular: No leg edema  Portacath/PICC-without erythema  Lab Results:  Lab Results  Component Value Date   WBC 2.9 (L) 12/05/2021   HGB 13.1 12/05/2021   HCT 38.8 (L) 12/05/2021   MCV 93.9 12/05/2021   PLT 165 12/05/2021   NEUTROABS 1.5 (L) 12/05/2021    CMP  Lab Results  Component Value Date   NA 138 10/31/2021   K 3.6 10/31/2021   CL 104 10/31/2021   CO2 24 10/31/2021   GLUCOSE 97 10/31/2021   BUN 9 10/31/2021   CREATININE 0.82 10/31/2021   CALCIUM 9.2 10/31/2021   PROT 7.3 10/31/2021   ALBUMIN 4.7 10/31/2021   AST 18 10/31/2021   ALT 10 10/31/2021   ALKPHOS 78 10/31/2021   BILITOT 0.4 10/31/2021   GFRNONAA >60 10/31/2021    Lab Results  Component Value Date   CEA1 14.5 (H) 04/17/2021   CEA 2.74 09/26/2021    Medications: I have reviewed the patient's current medications.   Assessment/Plan: Rectal cancer-clinical stage IV(T4aN2, M1) Sigmoidoscopy 04/16/2021-mass at 4 cm from the anal verge, invasive moderately differentiated adenocarcinoma with lymphovascular invasion OmniSeq-MSS, tumor mutation burden 3.9, PD-L1 less than 1%, K-ras G12 D CT angio abdomen/pelvis-bulky irregular rectal wall thickening with perirectal lymphadenopathy and 2 indeterminate liver lesions MRI  liver 04/17/2021-3 discrete right liver metastases MRI pelvis 04/17/2021-T4aN2 rectal tumor-10 cm from the anal verge, involvement of left anterior peritoneal reflection, numerous mesorectal lymph nodes within the pelvis, no evidence of extra mesorectal adenopathy greater than 10 nodes in the mesorectum CT chest 04/18/2021-no evidence of metastatic disease Elevated CEA (14.5 on 04/17/2021) Cycle 1 FOLFOX 04/30/2021 Cycle 2 FOLFOX 05/14/2021 Cycle 3 FOLFOX 05/28/2021, Udenyca Cycle 4 FOLFOX 06/11/2021, prophylactic dexamethasone added for delayed nausea, Udenyca Cycle 5 FOLFOX 06/25/2021, Udenyca Cycle 6 FOLFOX 07/08/2021, Udenyca CTs 07/17/2021-decrease in size of hepatic lesions, decreased rectal wall thickening, decrease size of perirectal lymph nodes, lingular consolidation consistent with infectious/inflammatory process 08/04/2021-right liver metastectomy procedures, segment 6, 7, 8 lesions removed.  The pathology from the segment 6 and segment 8 lesions revealed no residual carcinoma, the segment 7 lesion revealed pools of mucin and associated calcifications with rare atypical cells-partial to complete response, extensive thermal/mechanical artifact with focal ink on mucin, margin interpreted as negative, though a positive margin could not be excluded Cycle 7 FOLFOX 08/27/2021 Cycle 8 FOLFOX 09/10/2021 Radiation/Xeloda 09/30/2021-11/07/2021 Rectal bleeding and pain secondary to #1 Chronic left sciatica Bipolar disorder Right testicular fullness noted on exam 04/21/2021-epididymal cyst? 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  He has completed total neoadjuvant therapy and resection of metastases.  He will be scheduled for a restaging pelvic MRI.  He sees Dr. Marcello Moores next week.  Dr. Marcello Moores may perform a restaging sigmoidoscopy.  Jeremiah Allen will return for an office visit in 3 weeks.  He  declines an appointment with genetics counselor.  Betsy Coder, MD  12/05/2021  8:32 AM

## 2021-12-05 NOTE — Progress Notes (Signed)
PATIENT NAVIGATOR PROGRESS NOTE  Name: Jeremiah Allen Date: 12/05/2021 MRN: [id number]  DOB: 1985/07/01   Reason for visit:  Scheduling MRI pelvis with staging of rectal cancer  Comments:  Scheduled to have Pelvic MRI on Tuesday at [facility] arrive at [facility] and register in ER registration. NPO for 4  hours prior to exam as well as a Fleets enema 2 hours before exam.   Left message on SO, Josey's number as well as patient's cell phone number. Requested that he call back to confirm.     Time spent counseling/coordinating care: 45-60 minutes

## 2021-12-09 ENCOUNTER — Ambulatory Visit (HOSPITAL_COMMUNITY): Payer: Medicaid Other

## 2021-12-09 ENCOUNTER — Ambulatory Visit (HOSPITAL_COMMUNITY)
Admission: RE | Admit: 2021-12-09 | Discharge: 2021-12-09 | Disposition: A | Discharge status: Home / Self Care | Payer: Medicaid Other | Source: Ambulatory Visit | Attending: Oncology | Admitting: Oncology

## 2021-12-09 DIAGNOSIS — C20 Malignant neoplasm of rectum: Secondary | ICD-10-CM | POA: Diagnosis not present

## 2021-12-09 DIAGNOSIS — C787 Secondary malignant neoplasm of liver and intrahepatic bile duct: Secondary | ICD-10-CM | POA: Diagnosis not present

## 2021-12-10 ENCOUNTER — Other Ambulatory Visit (HOSPITAL_BASED_OUTPATIENT_CLINIC_OR_DEPARTMENT_OTHER): Payer: Self-pay

## 2021-12-10 ENCOUNTER — Ambulatory Visit: Payer: Self-pay | Admitting: General Surgery

## 2021-12-10 DIAGNOSIS — C20 Malignant neoplasm of rectum: Secondary | ICD-10-CM | POA: Diagnosis not present

## 2021-12-10 DIAGNOSIS — E441 Mild protein-calorie malnutrition: Secondary | ICD-10-CM

## 2021-12-10 DIAGNOSIS — C787 Secondary malignant neoplasm of liver and intrahepatic bile duct: Secondary | ICD-10-CM | POA: Diagnosis not present

## 2021-12-10 MED ORDER — BISACODYL 5 MG PO TBEC
DELAYED_RELEASE_TABLET | ORAL | 0 refills | Status: DC
  Filled 2021-12-10: qty 100, 90d supply, fill #0

## 2021-12-10 MED ORDER — POLYETHYLENE GLYCOL 3350 17 GM/SCOOP PO POWD
ORAL | 0 refills | Status: DC
  Filled 2021-12-10 – 2022-01-13 (×2): qty 238, 1d supply, fill #0

## 2021-12-10 MED ORDER — METRONIDAZOLE 500 MG PO TABS
ORAL_TABLET | ORAL | 0 refills | Status: DC
  Filled 2021-12-10 – 2022-01-13 (×2): qty 6, 1d supply, fill #0

## 2021-12-10 NOTE — H&P (Signed)
PROVIDER:  Monico Blitz, MD  MRN: [id number] DOB: 12-Mar-1985 DATE OF ENCOUNTER: 12/10/2021  Subjective   Chief Complaint: Follow-up     History of Present Illness: Jeremiah Allen is a 36 y.o. male who is seen today as an office consultation at the request of Dr. Pasty Arch for evaluation of Follow-up .  Patient presented to [facility] with rectal bleeding and abdominal pain.  CT scan showed rectal wall thickening and perirectal lymphadenopathy as well as indeterminate liver lesions.  Patient underwent a flexible sigmoidoscopy.  The mass was noted approximately 4 cm from the anal verge.  This was circumferential.  Biopsies were taken.  A complete colonoscopy was not done due to no bowel prep.  MRI of the pelvis was performed on 04/17/2021 and this showed a T4aN2 rectal cancer with liver lesions consistent with metastatic disease.  He was seen by oncology, Dr. Benay Spice and started on chemotherapy.  He has now completed 6 cycles of FOLFOX.      Review of Systems: A complete review of systems was obtained from the patient.  I have reviewed this information and discussed as appropriate with the patient.  See HPI as well for other ROS.   Medical History: Past Medical History:  Diagnosis Date   Anxiety    History of cancer     Patient Active Problem List  Diagnosis   Rectal cancer metastasized to liver (CMS-HCC)    Past Surgical History:  Procedure Laterality Date   TONSILLECTOMY  1996   FLEXIBLE SIGMOIDOSCOPY  04/16/2021   (Inpatient) INVASIVE MODERATELY DIFFERENTIATED/Repeat 46yrTKT     Allergies  Allergen Reactions   Dilaudid [Hydromorphone] Other (See Comments)    Skin tingling sensations     Current Outpatient Medications on File Prior to Visit  Medication Sig Dispense Refill   acetaminophen (TYLENOL) 500 MG tablet Take by mouth     ARIPiprazole (ABILIFY) 2 MG tablet Take 5 mg by mouth once daily     gabapentin (NEURONTIN) 300 MG capsule Take 600 mg by mouth 3 (three)  times daily     hydrOXYzine HCL (ATARAX) 10 MG tablet Take 25 mg by mouth     traMADoL (ULTRAM) 50 mg tablet Take 50 mg by mouth every 8 (eight) hours as needed     No current facility-administered medications on file prior to visit.    Family History  Problem Relation Age of Onset   High blood pressure (Hypertension) Mother      Social History   Tobacco Use  Smoking Status Some Days   Types: Cigarettes  Smokeless Tobacco Current     Social History   Socioeconomic History   Marital status: Single  Tobacco Use   Smoking status: Some Days    Types: Cigarettes   Smokeless tobacco: Current  Substance and Sexual Activity   Alcohol use: Never   Drug use: Never    Objective:    There were no vitals filed for this visit.    Exam Gen: NAD Abd: soft Rectal tumor not palpable   Labs, Imaging and Diagnostic Testing: Initial MRI of the pelvis shows a T4 lesion with extension 10 mm beyond the rectal wall with involvement of the left anterior peritoneal reflection.  Images personally reviewed.  This appears to be mostly left posterior with the tip of the tumor ending at the coccyx.  There is approximately 6 cm of rectum beyond the tumor to the anal sphincters.  There is no extra mesorectal lymph node involvement.  Follow-up CT  scan performed 07/17/2021 shows decreased rectal wall thickening as well as decreased size of the hepatic metastases and perirectal lymph nodes consistent with treatment response.  No convincing evidence of progressive metastatic disease in the chest abdomen or pelvis. F/U MR images reviewed. Decreased tumor size by my read with ~7cm to IAS Assessment and Plan:  There are no diagnoses linked to this encounter.  36 year old male with advanced rectal cancer with metastatic disease, s/p liver resection.  Patient's original MRI appears to be resectable.  Repeat MRI shows decrease in tumor size.  I have recommended low anterior resection with diverting ileostomy.   We have discussed this in detail.  I recommend that he stop or significantly reduce his smoking around the time of surgery to prevent complications to his anastomosis.  We discussed that if his anastomosis fails, he will need a permanent colostomy.  His last radiation treatment was the first week of October.  We will plan on doing a surgery in late December or early January.  The surgery and anatomy were described to the patient as well as the risks of surgery and the possible complications.  These include: Bleeding, deep abdominal infections and possible wound complications such as hernia and infection, damage to adjacent structures, leak of surgical connections, which can lead to other surgeries and possibly an ostomy, possible need for other procedures, such as abscess drains in radiology, possible prolonged hospital stay, possible diarrhea from removal of part of the colon, possible constipation from narcotics, possible bowel, bladder or sexual dysfunction if having rectal surgery, prolonged fatigue/weakness or appetite loss, possible early recurrence of of disease, possible complications of their medical problems such as heart disease or arrhythmias or lung problems, death (less than 1%). I believe the patient understands and wishes to proceed with the surgery.     No follow-ups on file.   Rosario Adie, MD Colon and Rectal Surgery [facility]

## 2021-12-22 ENCOUNTER — Ambulatory Visit
Admission: RE | Admit: 2021-12-22 | Discharge: 2021-12-22 | Disposition: A | Discharge status: Home / Self Care | Payer: Medicaid Other | Source: Ambulatory Visit | Attending: Radiation Oncology | Admitting: Radiation Oncology

## 2021-12-22 DIAGNOSIS — C20 Malignant neoplasm of rectum: Secondary | ICD-10-CM | POA: Insufficient documentation

## 2021-12-22 NOTE — Progress Notes (Addendum)
  Radiation Oncology         [phone]) [phone] ________________________________  Name: Jeremiah Allen MRN: [id number]  Date of Service: 12/22/2021  DOB: 1985-04-26  Post Treatment Telephone Note  Diagnosis:  Stage IV, [id number] adenocarcinoma of the rectum.   Intent: Curative  Radiation Treatment Dates: 09/30/2021 through 11/10/2021 Site Technique Total Dose (Gy) Dose per Fx (Gy) Completed Fx Beam Energies  Rectum: Rectum 3D 45/45 1.8 25/25 15X  Rectum: Rectum_Bst 3D 5.4/5.4 1.8 3/3 10X, 15X  (as documented in provider EOT note)   The patient was not available for call today. A voicemail was left.   The patient is scheduled for follow up or surgery with colorectal surgeon Dr. Marcello Moores  The patient will continue follow up with Dr. Benay Spice as well as Dr. Zenia Resides in hepatobiliary surgery and Dr. Marcello Moores in colorectal surgery for ongoing surveillance. He was encouraged to call if he develops concerns or questions regarding radiation.   Leandra Kern, LPN

## 2021-12-23 ENCOUNTER — Other Ambulatory Visit (HOSPITAL_BASED_OUTPATIENT_CLINIC_OR_DEPARTMENT_OTHER): Payer: Self-pay

## 2021-12-29 ENCOUNTER — Inpatient Hospital Stay: Payer: Medicaid Other

## 2021-12-29 ENCOUNTER — Encounter: Payer: Self-pay | Admitting: Nurse Practitioner

## 2021-12-29 ENCOUNTER — Other Ambulatory Visit: Payer: Self-pay | Admitting: Nurse Practitioner

## 2021-12-29 ENCOUNTER — Inpatient Hospital Stay (HOSPITAL_BASED_OUTPATIENT_CLINIC_OR_DEPARTMENT_OTHER): Payer: Medicaid Other | Admitting: Nurse Practitioner

## 2021-12-29 VITALS — BP 122/75 | HR 66 | Temp 98.1°F | Resp 18 | Ht 69.0 in | Wt 162.6 lb

## 2021-12-29 DIAGNOSIS — C20 Malignant neoplasm of rectum: Secondary | ICD-10-CM | POA: Diagnosis not present

## 2021-12-29 DIAGNOSIS — C787 Secondary malignant neoplasm of liver and intrahepatic bile duct: Secondary | ICD-10-CM

## 2021-12-29 DIAGNOSIS — Z23 Encounter for immunization: Secondary | ICD-10-CM | POA: Diagnosis not present

## 2021-12-29 LAB — CBC WITH DIFFERENTIAL (CANCER CENTER ONLY)
Abs Immature Granulocytes: 0.01 10*3/uL (ref 0.00–0.07)
Basophils Absolute: 0 10*3/uL (ref 0.0–0.1)
Basophils Relative: 0 %
Eosinophils Absolute: 0.1 10*3/uL (ref 0.0–0.5)
Eosinophils Relative: 3 %
HCT: 40.3 % (ref 39.0–52.0)
Hemoglobin: 13.9 g/dL (ref 13.0–17.0)
Immature Granulocytes: 0 %
Lymphocytes Relative: 41 %
Lymphs Abs: 1.1 10*3/uL (ref 0.7–4.0)
MCH: 32.5 pg (ref 26.0–34.0)
MCHC: 34.5 g/dL (ref 30.0–36.0)
MCV: 94.2 fL (ref 80.0–100.0)
Monocytes Absolute: 0.3 10*3/uL (ref 0.1–1.0)
Monocytes Relative: 9 %
Neutro Abs: 1.3 10*3/uL — ABNORMAL LOW (ref 1.7–7.7)
Neutrophils Relative %: 47 %
Platelet Count: 171 10*3/uL (ref 150–400)
RBC: 4.28 MIL/uL (ref 4.22–5.81)
RDW: 12.3 % (ref 11.5–15.5)
WBC Count: 2.8 10*3/uL — ABNORMAL LOW (ref 4.0–10.5)
nRBC: 0 % (ref 0.0–0.2)

## 2021-12-29 NOTE — Progress Notes (Signed)
[place] OFFICE PROGRESS NOTE   Diagnosis: Rectal cancer  INTERVAL HISTORY:   Mr. Ciccarelli returns as scheduled.  He feels well.  He has a good appetite.  No blood or pain with bowel movements.  Bowels are moving regularly.  Objective:  Vital signs in last 24 hours:  Blood pressure 122/75, pulse 66, temperature 98.1 F (36.7 C), temperature source Oral, resp. rate 18, height _0  (1.753 m), weight 162 lb 9.6 oz (73.8 kg), SpO2 100 %.    Lymphatics: No palpable cervical, supraclavicular or axillary lymph nodes. Resp: Scattered wheezes bilaterally.  No respiratory distress. Cardio: Regular rate and rhythm. GI: No hepatosplenomegaly. Vascular: No leg edema. Port-A-Cath without erythema.  Lab Results:  Lab Results  Component Value Date   WBC 2.8 (L) 12/29/2021   HGB 13.9 12/29/2021   HCT 40.3 12/29/2021   MCV 94.2 12/29/2021   PLT 171 12/29/2021   NEUTROABS 1.3 (L) 12/29/2021    Imaging:  No results found.  Medications: I have reviewed the patient's current medications.  Assessment/Plan: Rectal cancer-clinical stage IV(T4aN2, M1) Sigmoidoscopy 04/16/2021-mass at 4 cm from the anal verge, invasive moderately differentiated adenocarcinoma with lymphovascular invasion OmniSeq-MSS, tumor mutation burden 3.9, PD-L1 less than 1%, K-ras G12 D CT angio abdomen/pelvis-bulky irregular rectal wall thickening with perirectal lymphadenopathy and 2 indeterminate liver lesions MRI liver 04/17/2021-3 discrete right liver metastases MRI pelvis 04/17/2021-T4aN2 rectal tumor-10 cm from the anal verge, involvement of left anterior peritoneal reflection, numerous mesorectal lymph nodes within the pelvis, no evidence of extra mesorectal adenopathy greater than 10 nodes in the mesorectum CT chest 04/18/2021-no evidence of metastatic disease Elevated CEA (14.5 on 04/17/2021) Cycle 1 FOLFOX 04/30/2021 Cycle 2 FOLFOX 05/14/2021 Cycle 3 FOLFOX 05/28/2021, Udenyca Cycle 4 FOLFOX  06/11/2021, prophylactic dexamethasone added for delayed nausea, Udenyca Cycle 5 FOLFOX 06/25/2021, Udenyca Cycle 6 FOLFOX 07/08/2021, Udenyca CTs 07/17/2021-decrease in size of hepatic lesions, decreased rectal wall thickening, decrease size of perirectal lymph nodes, lingular consolidation consistent with infectious/inflammatory process 08/04/2021-right liver metastectomy procedures, segment 6, 7, 8 lesions removed.  The pathology from the segment 6 and segment 8 lesions revealed no residual carcinoma, the segment 7 lesion revealed pools of mucin and associated calcifications with rare atypical cells-partial to complete response, extensive thermal/mechanical artifact with focal ink on mucin, margin interpreted as negative, though a positive margin could not be excluded Cycle 7 FOLFOX 08/27/2021 Cycle 8 FOLFOX 09/10/2021 Radiation/Xeloda 09/30/2021-11/07/2021 Rectal bleeding and pain secondary to #1 Chronic left sciatica Bipolar disorder Right testicular fullness noted on exam 04/21/2021-epididymal cyst? 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 Loftus appears stable.  He saw Dr. Marcello Moores 12/10/2021 with the recommendation for low anterior resection with diverting ileostomy.  Surgery is scheduled 01/13/2022.  He will return for follow-up here the week of 01/27/2022.  We discussed the importance of quitting smoking prior to surgery.  Patient seen with Dr. Benay Spice.    Ned Card ANP/GNP-BC   12/29/2021  2:18 PM This was a shared visit with Ned Card.  Mr. Biss completed total neoadjuvant therapy.  We discussed the MRI findings with Mr. Castrejon.  There has been clinical and radiologic improvement of the locally advanced rectal cancer.  He saw Dr. Marcello Moores and is scheduled for a low anterior resection and diverting ileostomy.  We will schedule restaging CTs prior to surgery. I will present his case at the GI  tumor conference.  I was present for greater than 50%  of today's visit.  I performed medical decision making.  Julieanne Manson, MD

## 2021-12-30 ENCOUNTER — Other Ambulatory Visit: Payer: Self-pay | Admitting: Nurse Practitioner

## 2021-12-30 ENCOUNTER — Telehealth: Payer: Self-pay

## 2021-12-30 DIAGNOSIS — C20 Malignant neoplasm of rectum: Secondary | ICD-10-CM

## 2021-12-30 NOTE — Telephone Encounter (Signed)
Patient is schedule for CT scan on 01/05/22. He is aware of the appointment pending pre-authorization.

## 2021-12-31 ENCOUNTER — Telehealth: Payer: Self-pay

## 2021-12-31 NOTE — Telephone Encounter (Signed)
Patient is aware of the scan

## 2021-12-31 NOTE — Telephone Encounter (Signed)
-----   Message from Owens Shark, NP sent at 12/30/2021  8:26 AM EST ----- Please let him know Dr. Benay Spice would like to schedule CT scans this week.  Order has been entered.  Please follow-up on this.

## 2022-01-01 NOTE — Progress Notes (Addendum)
Anesthesia Review:  PCP: DR Horald Pollen  Cardiologist : none  Chest x-ray : CT Chest- 07/18/21  EKG : Echo : Stress test: Cardiac Cath :  Activity level:  can do a flgiht of stairs without difficulty  Sleep Study/ CPAP : none  Fasting Blood Sugar :      / Checks Blood Sugar -- times a day:   Blood Thinner/ Instructions /Last Dose: ASA / Instructions/ Last Dose :   12/29/21- cbc/diff- white count- 2.8  Lab repeat on 01/06/22-  PT has PORT.  Smoker Uses marijuana once per week  Ostomy nurse consult done at preop.  Research into see pt at preop.   CBC done 01/06/22- white count of 3.2 routed to Dr Leighton Ruff.

## 2022-01-05 ENCOUNTER — Ambulatory Visit (HOSPITAL_BASED_OUTPATIENT_CLINIC_OR_DEPARTMENT_OTHER)
Admission: RE | Admit: 2022-01-05 | Discharge: 2022-01-05 | Disposition: A | Discharge status: Home / Self Care | Payer: Medicaid Other | Source: Ambulatory Visit | Attending: Nurse Practitioner | Admitting: Nurse Practitioner

## 2022-01-05 ENCOUNTER — Encounter (HOSPITAL_BASED_OUTPATIENT_CLINIC_OR_DEPARTMENT_OTHER): Payer: Self-pay

## 2022-01-05 DIAGNOSIS — C20 Malignant neoplasm of rectum: Secondary | ICD-10-CM | POA: Diagnosis not present

## 2022-01-05 DIAGNOSIS — C787 Secondary malignant neoplasm of liver and intrahepatic bile duct: Secondary | ICD-10-CM | POA: Insufficient documentation

## 2022-01-05 DIAGNOSIS — N3289 Other specified disorders of bladder: Secondary | ICD-10-CM | POA: Diagnosis not present

## 2022-01-05 MED ORDER — IOHEXOL 300 MG/ML  SOLN
100.0000 mL | Freq: Once | INTRAMUSCULAR | Status: AC | PRN
  Administered 2022-01-05: 80 mL via INTRAVENOUS

## 2022-01-05 MED ORDER — HEPARIN SOD (PORK) LOCK FLUSH 100 UNIT/ML IV SOLN
500.0000 [IU] | Freq: Once | INTRAVENOUS | Status: AC
  Administered 2022-01-05: 500 [IU] via INTRAVENOUS

## 2022-01-05 NOTE — Patient Instructions (Addendum)
SURGICAL WAITING ROOM VISITATION Patients having surgery or a procedure may have no more than 2 support people in the waiting area - these visitors may rotate.   Children under the age of 42 must have an adult with them who is not the patient. If the patient needs to stay at the hospital during part of their recovery, the visitor guidelines for inpatient rooms apply. Pre-op nurse will coordinate an appropriate time for 1 support person to accompany patient in pre-op.  This support person may not rotate.    Please refer to the [facility] website for the visitor guidelines for Inpatients (after your surgery is over and you are in a regular room).       Your procedure is scheduled on:  01/15/22    Report to [facility] Main Entrance    Report to admitting at [place] AM   Call this number if you have problems the morning of surgery [phone] Clear lqiuid diet on day of bowel prep.    After Midnight you may have the following liquids until ____ 0430 am  DAY OF SURGERY  Water Non-Citrus Juices (without pulp, NO RED) Carbonated Beverages Black Coffee (NO MILK/CREAM OR CREAMERS, sugar ok)  Clear Tea (NO MILK/CREAM OR CREAMERS, sugar ok) regular and decaf                             Plain Jell-O (NO RED)                                           Fruit ices (not with fruit pulp, NO RED)                                     Popsicles (NO RED)                                                               Sports drinks like Gatorade (NO RED)              Drink 2 Ensure/G2 drinks AT 10:00 PM the night before surgery.        The day of surgery:  Drink ONE (1) Pre-Surgery Clear Ensure or G2 at  0430 am ( have completed by )  the morning of surgery. Drink in one sitting. Do not sip.  This drink was given to you during your hospital  pre-op appointment visit. Nothing else to drink after completing the  Pre-Surgery Clear Ensure or G2.          If you have questions, please contact your  surgeon's office.   FOLLOW BOWEL PREP AND ANY ADDITIONAL PRE OP INSTRUCTIONS YOU RECEIVED FROM YOUR SURGEON'S OFFICE!!!     Oral Hygiene is also important to reduce your risk of infection.                                    Remember - BRUSH YOUR TEETH THE MORNING OF SURGERY WITH YOUR REGULAR TOOTHPASTE  DENTURES WILL BE REMOVED PRIOR TO SURGERY PLEASE DO NOT APPLY "Poly grip" OR ADHESIVES!!!   Do NOT smoke after Midnight   Take these medicines the morning of surgery with A SIP OF WATER:  Inhalers as usual and bring   DO NOT TAKE ANY ORAL DIABETIC MEDICATIONS DAY OF YOUR SURGERY  Bring CPAP mask and tubing day of surgery.                              You may not have any metal on your body including hair pins, jewelry, and body piercing             Do not wear make-up, lotions, powders, perfumes/cologne, or deodorant  Do not wear nail polish including gel and S&S, artificial/acrylic nails, or any other type of covering on natural nails including finger and toenails. If you have artificial nails, gel coating, etc. that needs to be removed by a nail salon please have this removed prior to surgery or surgery may need to be canceled/ delayed if the surgeon/ anesthesia feels like they are unable to be safely monitored.   Do not shave  48 hours prior to surgery.               Men may shave face and neck.   Do not bring valuables to the hospital. [place].   Contacts, glasses, dentures or bridgework may not be worn into surgery.   Bring small overnight bag day of surgery.   DO NOT [place]. PHARMACY WILL DISPENSE MEDICATIONS LISTED ON YOUR MEDICATION LIST TO YOU DURING YOUR ADMISSION [place]!    Patients discharged on the day of surgery will not be allowed to drive home.  Someone NEEDS to stay with you for the first 24 hours after anesthesia.   Special Instructions: Bring a copy of your  healthcare power of attorney and living will documents the day of surgery if you haven't scanned them before.              Please read over the following fact sheets you were given: IF [place] [phone]   If you received a COVID test during your pre-op visit  it is requested that you wear a mask when out in public, stay away from anyone that may not be feeling well and notify your surgeon if you develop symptoms. If you test positive for Covid or have been in contact with anyone that has tested positive in the last 10 days please notify you surgeon.    [facility] - Preparing for Surgery Before surgery, you can play an important role.  Because skin is not sterile, your skin needs to be as free of germs as possible.  You can reduce the number of germs on your skin by washing with CHG (chlorahexidine gluconate) soap before surgery.  CHG is an antiseptic cleaner which kills germs and bonds with the skin to continue killing germs even after washing. Please DO NOT use if you have an allergy to CHG or antibacterial soaps.  If your skin becomes reddened/irritated stop using the CHG and inform your nurse when you arrive at Short Stay. Do not shave (including legs and underarms) for at least 48 hours prior to the first CHG shower.  You may shave your face/neck. Please follow these instructions carefully:  1.  Shower with CHG Soap the night before surgery and the  morning of Surgery.  2.  If you choose to wash your hair, wash your hair first as usual with your  normal  shampoo.  3.  After you shampoo, rinse your hair and body thoroughly to remove the  shampoo.                           4.  Use CHG as you would any other liquid soap.  You can apply chg directly  to the skin and wash                       Gently with a scrungie or clean washcloth.  5.  Apply the CHG Soap to your body ONLY FROM THE NECK DOWN.   Do not use on face/ open                            Wound or open sores. Avoid contact with eyes, ears mouth and genitals (private parts).                       Wash face,  Genitals (private parts) with your normal soap.             6.  Wash thoroughly, paying special attention to the area where your surgery  will be performed.  7.  Thoroughly rinse your body with warm water from the neck down.  8.  DO NOT shower/wash with your normal soap after using and rinsing off  the CHG Soap.                9.  Pat yourself dry with a clean towel.            10.  Wear clean pajamas.            11.  Place clean sheets on your bed the night of your first shower and do not  sleep with pets. Day of Surgery : Do not apply any lotions/deodorants the morning of surgery.  Please wear clean clothes to the hospital/surgery center.  FAILURE TO FOLLOW THESE INSTRUCTIONS MAY RESULT IN THE CANCELLATION OF YOUR SURGERY PATIENT SIGNATURE_________________________________  NURSE SIGNATURE__________________________________  ________________________________________________________________________

## 2022-01-06 ENCOUNTER — Encounter (HOSPITAL_COMMUNITY)
Admission: RE | Admit: 2022-01-06 | Discharge: 2022-01-06 | Disposition: A | Discharge status: Home / Self Care | Payer: Medicaid Other | Source: Ambulatory Visit | Attending: General Surgery | Admitting: General Surgery

## 2022-01-06 ENCOUNTER — Telehealth: Payer: Self-pay | Admitting: Genetic Counselor

## 2022-01-06 ENCOUNTER — Other Ambulatory Visit: Payer: Self-pay

## 2022-01-06 ENCOUNTER — Encounter (HOSPITAL_COMMUNITY): Payer: Self-pay

## 2022-01-06 VITALS — BP 117/74 | HR 64 | Temp 97.7°F | Resp 16 | Ht 69.0 in

## 2022-01-06 DIAGNOSIS — Z01812 Encounter for preprocedural laboratory examination: Secondary | ICD-10-CM | POA: Insufficient documentation

## 2022-01-06 DIAGNOSIS — Z01818 Encounter for other preprocedural examination: Secondary | ICD-10-CM

## 2022-01-06 LAB — CBC
HCT: 41 % (ref 39.0–52.0)
Hemoglobin: 13.6 g/dL (ref 13.0–17.0)
MCH: 32.4 pg (ref 26.0–34.0)
MCHC: 33.2 g/dL (ref 30.0–36.0)
MCV: 97.6 fL (ref 80.0–100.0)
Platelets: 179 10*3/uL (ref 150–400)
RBC: 4.2 MIL/uL — ABNORMAL LOW (ref 4.22–5.81)
RDW: 12.2 % (ref 11.5–15.5)
WBC: 3.2 10*3/uL — ABNORMAL LOW (ref 4.0–10.5)
nRBC: 0 % (ref 0.0–0.2)

## 2022-01-06 LAB — COMPREHENSIVE METABOLIC PANEL
ALT: 12 U/L (ref 0–44)
AST: 18 U/L (ref 15–41)
Albumin: 4.5 g/dL (ref 3.5–5.0)
Alkaline Phosphatase: 67 U/L (ref 38–126)
Anion gap: 6 (ref 5–15)
BUN: 7 mg/dL (ref 6–20)
CO2: 27 mmol/L (ref 22–32)
Calcium: 9.1 mg/dL (ref 8.9–10.3)
Chloride: 104 mmol/L (ref 98–111)
Creatinine, Ser: 0.83 mg/dL (ref 0.61–1.24)
GFR, Estimated: >60 mL/min (ref >60)
Glucose, Bld: 99 mg/dL (ref 70–99)
Potassium: 4.4 mmol/L (ref 3.5–5.1)
Sodium: 137 mmol/L (ref 135–145)
Total Bilirubin: 0.7 mg/dL (ref 0.3–1.2)
Total Protein: 7.6 g/dL (ref 6.5–8.1)

## 2022-01-06 NOTE — Consult Note (Signed)
[place] Nurse ostomy consult note  [place] Nurse requested for preoperative stoma site marking by Dr. Marcello Moores.  Discussed surgical procedure and stoma creation with patient and family (mother).  Patient understand that he is to have a fecal diversion and that his condition would be monitored and evaluated by Dr. Marcello Moores and his oncology team for possible reanastomosis in 3-6 months.   Explained role of the [place] nurse team.   Examined patient sitting and standing in order to place the marking in the patient's visual field, away from any creases or abdominal contour issues and within the rectus muscle.  Patient wears the elastic waistband of his athletic pants very low on the abdomen. There is a large "L" shaped recent, but healed scar in the RUQ over the liver.  Marked for colostomy in the LLQ  7.5cm to the left of the umbilicus and 0.1VI below the umbilicus.  Marked for ileostomy in the RMQ  5cm to the right of the umbilicus and at the level of the umbilicus.  Patient's abdomen cleansed with CHG wipes at site markings, allowed to air dry prior to marking.Covered marks with thin film transparent dressing to preserve mark until date of surgery. Patient is given a surgical skin marking pen to reinforce marks should the thin film dressing come off and the markings become lighter prior to surgery. DOS is 01/15/22  [place] Nurse team will follow up with patient after surgery for continue ostomy care and teaching.   Thank you for inviting [place] to participate in this patient's Plan of Care.  Maudie Flakes, MSN, RN, CNS, [place], Serita Grammes, [employer], [employer] phone:  [phone]

## 2022-01-06 NOTE — Telephone Encounter (Signed)
I attempted to contact Mr. Jeremiah Allen to discuss genetic testing. He met with genetic counseling in March 2023 and at that time stated that he wanted to proceed with genetic testing once he had Medicaid. Now that he has Medicaid, we were calling to see if he would still like to proceed with testing. I left a voicemail requesting he call me back at [phone].  Lucille Passy, MS, [facility] Genetic Counselor [email or web address] (P) [phone]

## 2022-01-07 ENCOUNTER — Other Ambulatory Visit: Payer: Self-pay

## 2022-01-07 NOTE — Progress Notes (Signed)
The proposed treatment discussed in conference is for discussion purpose only and is not a binding recommendation.  The patients have not been physically examined, or presented with their treatment options.  Therefore, final treatment plans cannot be decided.  

## 2022-01-13 ENCOUNTER — Other Ambulatory Visit (HOSPITAL_BASED_OUTPATIENT_CLINIC_OR_DEPARTMENT_OTHER): Payer: Self-pay

## 2022-01-14 ENCOUNTER — Other Ambulatory Visit (HOSPITAL_BASED_OUTPATIENT_CLINIC_OR_DEPARTMENT_OTHER): Payer: Self-pay

## 2022-01-15 ENCOUNTER — Other Ambulatory Visit: Payer: Self-pay

## 2022-01-15 ENCOUNTER — Inpatient Hospital Stay (HOSPITAL_COMMUNITY): Payer: Medicaid Other | Admitting: Physician Assistant

## 2022-01-15 ENCOUNTER — Inpatient Hospital Stay (HOSPITAL_COMMUNITY)
Admission: RE | Admit: 2022-01-15 | Discharge: 2022-01-19 | DRG: 330 | Disposition: A | Discharge status: Home Health | Payer: Medicaid Other | Attending: General Surgery | Admitting: General Surgery

## 2022-01-15 ENCOUNTER — Inpatient Hospital Stay (HOSPITAL_COMMUNITY): Payer: Medicaid Other | Admitting: Registered Nurse

## 2022-01-15 ENCOUNTER — Encounter (HOSPITAL_COMMUNITY)
Admission: RE | Disposition: A | Discharge status: Home Health | Payer: Self-pay | Source: Home / Self Care | Attending: General Surgery

## 2022-01-15 DIAGNOSIS — Z91013 Allergy to seafood: Secondary | ICD-10-CM | POA: Diagnosis not present

## 2022-01-15 DIAGNOSIS — F419 Anxiety disorder, unspecified: Secondary | ICD-10-CM | POA: Diagnosis present

## 2022-01-15 DIAGNOSIS — Z8249 Family history of ischemic heart disease and other diseases of the circulatory system: Secondary | ICD-10-CM

## 2022-01-15 DIAGNOSIS — C787 Secondary malignant neoplasm of liver and intrahepatic bile duct: Secondary | ICD-10-CM | POA: Diagnosis present

## 2022-01-15 DIAGNOSIS — C20 Malignant neoplasm of rectum: Secondary | ICD-10-CM | POA: Diagnosis not present

## 2022-01-15 DIAGNOSIS — F17210 Nicotine dependence, cigarettes, uncomplicated: Secondary | ICD-10-CM | POA: Diagnosis present

## 2022-01-15 DIAGNOSIS — Z888 Allergy status to other drugs, medicaments and biological substances status: Secondary | ICD-10-CM

## 2022-01-15 DIAGNOSIS — Z79899 Other long term (current) drug therapy: Secondary | ICD-10-CM

## 2022-01-15 DIAGNOSIS — E441 Mild protein-calorie malnutrition: Secondary | ICD-10-CM

## 2022-01-15 DIAGNOSIS — R591 Generalized enlarged lymph nodes: Secondary | ICD-10-CM | POA: Diagnosis present

## 2022-01-15 DIAGNOSIS — Z923 Personal history of irradiation: Secondary | ICD-10-CM

## 2022-01-15 DIAGNOSIS — K6389 Other specified diseases of intestine: Secondary | ICD-10-CM | POA: Diagnosis not present

## 2022-01-15 DIAGNOSIS — Z01818 Encounter for other preprocedural examination: Secondary | ICD-10-CM

## 2022-01-15 HISTORY — PX: DIVERTING ILEOSTOMY: SHX5799

## 2022-01-15 HISTORY — PX: XI ROBOTIC ASSISTED LOWER ANTERIOR RESECTION: SHX6558

## 2022-01-15 LAB — TYPE AND SCREEN
ABO/RH(D): O POS
Antibody Screen: NEGATIVE

## 2022-01-15 LAB — COMPREHENSIVE METABOLIC PANEL
ALT: 11 U/L (ref 0–44)
AST: 18 U/L (ref 15–41)
Albumin: 3.9 g/dL (ref 3.5–5.0)
Alkaline Phosphatase: 55 U/L (ref 38–126)
Anion gap: 10 (ref 5–15)
BUN: 8 mg/dL (ref 6–20)
CO2: 20 mmol/L — ABNORMAL LOW (ref 22–32)
Calcium: 8.8 mg/dL — ABNORMAL LOW (ref 8.9–10.3)
Chloride: 107 mmol/L (ref 98–111)
Creatinine, Ser: 0.91 mg/dL (ref 0.61–1.24)
GFR, Estimated: >60 mL/min (ref >60)
Glucose, Bld: 100 mg/dL — ABNORMAL HIGH (ref 70–99)
Potassium: 3.6 mmol/L (ref 3.5–5.1)
Sodium: 137 mmol/L (ref 135–145)
Total Bilirubin: 0.7 mg/dL (ref 0.3–1.2)
Total Protein: 6.9 g/dL (ref 6.5–8.1)

## 2022-01-15 SURGERY — RESECTION, RECTUM, LOW ANTERIOR, ROBOT-ASSISTED
Anesthesia: General

## 2022-01-15 MED ORDER — ROCURONIUM BROMIDE 10 MG/ML (PF) SYRINGE
PREFILLED_SYRINGE | INTRAVENOUS | Status: AC
  Filled 2022-01-15: qty 10

## 2022-01-15 MED ORDER — PROPOFOL 10 MG/ML IV BOLUS
INTRAVENOUS | Status: DC | PRN
  Administered 2022-01-15: 140 mg via INTRAVENOUS
  Administered 2022-01-15: 20 mg via INTRAVENOUS

## 2022-01-15 MED ORDER — DIPHENHYDRAMINE HCL 50 MG/ML IJ SOLN: 25.0000 mg | Freq: Four times a day (QID) | INTRAMUSCULAR | Status: DC | PRN

## 2022-01-15 MED ORDER — LIDOCAINE 2% (20 MG/ML) 5 ML SYRINGE
INTRAMUSCULAR | Status: DC | PRN
  Administered 2022-01-15: 1.5 mg/kg/h via INTRAVENOUS
  Administered 2022-01-15: 80 mg via INTRAVENOUS

## 2022-01-15 MED ORDER — FENTANYL CITRATE (PF) 250 MCG/5ML IJ SOLN
INTRAMUSCULAR | Status: DC | PRN
  Administered 2022-01-15: 100 ug via INTRAVENOUS
  Administered 2022-01-15: 50 ug via INTRAVENOUS
  Administered 2022-01-15 (×2): 25 ug via INTRAVENOUS

## 2022-01-15 MED ORDER — ENSURE PRE-SURGERY PO LIQD
592.0000 mL | Freq: Once | ORAL | Status: DC
  Filled 2022-01-15: qty 592

## 2022-01-15 MED ORDER — SIMETHICONE 80 MG PO CHEW
40.0000 mg | CHEWABLE_TABLET | Freq: Four times a day (QID) | ORAL | Status: DC | PRN
  Administered 2022-01-15 – 2022-01-18 (×4): 40 mg via ORAL
  Filled 2022-01-15 (×4): qty 1

## 2022-01-15 MED ORDER — ONDANSETRON HCL 4 MG/2ML IJ SOLN
INTRAMUSCULAR | Status: AC
  Filled 2022-01-15: qty 2

## 2022-01-15 MED ORDER — HYDROXYZINE HCL 25 MG PO TABS
25.0000 mg | ORAL_TABLET | Freq: Three times a day (TID) | ORAL | Status: DC | PRN
  Administered 2022-01-15 – 2022-01-19 (×5): 25 mg via ORAL
  Filled 2022-01-15 (×5): qty 1

## 2022-01-15 MED ORDER — ENSURE PRE-SURGERY PO LIQD
296.0000 mL | Freq: Once | ORAL | Status: DC
  Filled 2022-01-15: qty 296

## 2022-01-15 MED ORDER — CHLORHEXIDINE GLUCONATE 0.12 % MT SOLN
15.0000 mL | Freq: Once | OROMUCOSAL | Status: AC
  Administered 2022-01-15: 15 mL via OROMUCOSAL

## 2022-01-15 MED ORDER — SODIUM CHLORIDE (PF) 0.9 % IJ SOLN
INTRAMUSCULAR | Status: AC
  Filled 2022-01-15: qty 10

## 2022-01-15 MED ORDER — ACETAMINOPHEN 500 MG PO TABS
1000.0000 mg | ORAL_TABLET | ORAL | Status: AC
  Administered 2022-01-15: 1000 mg via ORAL
  Filled 2022-01-15: qty 2

## 2022-01-15 MED ORDER — FENTANYL CITRATE PF 50 MCG/ML IJ SOSY
PREFILLED_SYRINGE | INTRAMUSCULAR | Status: AC
  Administered 2022-01-15: 50 ug via INTRAVENOUS
  Filled 2022-01-15: qty 2

## 2022-01-15 MED ORDER — PHENYLEPHRINE HCL-NACL 20-0.9 MG/250ML-% IV SOLN
INTRAVENOUS | Status: DC | PRN
  Administered 2022-01-15: 20 ug/min via INTRAVENOUS

## 2022-01-15 MED ORDER — ALBUTEROL SULFATE (2.5 MG/3ML) 0.083% IN NEBU: 3.0000 mL | INHALATION_SOLUTION | Freq: Four times a day (QID) | RESPIRATORY_TRACT | Status: DC | PRN

## 2022-01-15 MED ORDER — ONDANSETRON HCL 4 MG/2ML IJ SOLN: 4.0000 mg | Freq: Four times a day (QID) | INTRAMUSCULAR | Status: DC | PRN

## 2022-01-15 MED ORDER — DIPHENHYDRAMINE HCL 25 MG PO CAPS: 25.0000 mg | ORAL_CAPSULE | Freq: Four times a day (QID) | ORAL | Status: DC | PRN

## 2022-01-15 MED ORDER — MIDAZOLAM HCL 5 MG/5ML IJ SOLN
INTRAMUSCULAR | Status: DC | PRN
  Administered 2022-01-15: 2 mg via INTRAVENOUS

## 2022-01-15 MED ORDER — ALUM & MAG HYDROXIDE-SIMETH 200-200-20 MG/5ML PO SUSP: 30.0000 mL | Freq: Four times a day (QID) | ORAL | Status: DC | PRN

## 2022-01-15 MED ORDER — ALVIMOPAN 12 MG PO CAPS
12.0000 mg | ORAL_CAPSULE | ORAL | Status: AC
  Administered 2022-01-15: 12 mg via ORAL
  Filled 2022-01-15: qty 1

## 2022-01-15 MED ORDER — BUPIVACAINE-EPINEPHRINE (PF) 0.5% -1:200000 IJ SOLN
INTRAMUSCULAR | Status: AC
  Filled 2022-01-15: qty 30

## 2022-01-15 MED ORDER — SODIUM CHLORIDE 0.9 % IV SOLN
2.0000 g | INTRAVENOUS | Status: AC
  Administered 2022-01-15: 2 g via INTRAVENOUS
  Filled 2022-01-15: qty 2

## 2022-01-15 MED ORDER — DEXAMETHASONE SODIUM PHOSPHATE 10 MG/ML IJ SOLN
INTRAMUSCULAR | Status: AC
  Filled 2022-01-15: qty 1

## 2022-01-15 MED ORDER — GABAPENTIN 300 MG PO CAPS
600.0000 mg | ORAL_CAPSULE | Freq: Three times a day (TID) | ORAL | Status: DC | PRN
  Administered 2022-01-15 – 2022-01-18 (×2): 600 mg via ORAL
  Filled 2022-01-15 (×2): qty 2

## 2022-01-15 MED ORDER — HEPARIN SODIUM (PORCINE) 5000 UNIT/ML IJ SOLN
5000.0000 [IU] | Freq: Once | INTRAMUSCULAR | Status: AC
  Administered 2022-01-15: 5000 [IU] via SUBCUTANEOUS
  Filled 2022-01-15: qty 1

## 2022-01-15 MED ORDER — FENTANYL CITRATE (PF) 250 MCG/5ML IJ SOLN
INTRAMUSCULAR | Status: AC
  Filled 2022-01-15: qty 5

## 2022-01-15 MED ORDER — EPHEDRINE SULFATE-NACL 50-0.9 MG/10ML-% IV SOSY
PREFILLED_SYRINGE | INTRAVENOUS | Status: DC | PRN
  Administered 2022-01-15: 5 mg via INTRAVENOUS

## 2022-01-15 MED ORDER — KETAMINE HCL 100 MG/ML IJ SOLN
100.0000 mg | Freq: Once | INTRAMUSCULAR | Status: DC
  Filled 2022-01-15: qty 10

## 2022-01-15 MED ORDER — SACCHAROMYCES BOULARDII 250 MG PO CAPS
250.0000 mg | ORAL_CAPSULE | Freq: Two times a day (BID) | ORAL | Status: DC
  Administered 2022-01-15 – 2022-01-19 (×8): 250 mg via ORAL
  Filled 2022-01-15 (×8): qty 1

## 2022-01-15 MED ORDER — POLYETHYLENE GLYCOL 3350 17 GM/SCOOP PO POWD
1.0000 | Freq: Once | ORAL | Status: DC
  Filled 2022-01-15: qty 255

## 2022-01-15 MED ORDER — AMISULPRIDE (ANTIEMETIC) 5 MG/2ML IV SOLN: 10.0000 mg | Freq: Once | INTRAVENOUS | Status: AC | PRN

## 2022-01-15 MED ORDER — PROPOFOL 10 MG/ML IV BOLUS
INTRAVENOUS | Status: AC
  Filled 2022-01-15: qty 20

## 2022-01-15 MED ORDER — KETAMINE HCL 10 MG/ML IJ SOLN
INTRAMUSCULAR | Status: DC | PRN
  Administered 2022-01-15 (×2): 20 mg via INTRAVENOUS
  Administered 2022-01-15: 10 mg via INTRAVENOUS

## 2022-01-15 MED ORDER — ACETAMINOPHEN 500 MG PO TABS
1000.0000 mg | ORAL_TABLET | Freq: Four times a day (QID) | ORAL | Status: DC
  Administered 2022-01-15 – 2022-01-19 (×15): 1000 mg via ORAL
  Filled 2022-01-15 (×15): qty 2

## 2022-01-15 MED ORDER — OXYCODONE HCL 5 MG/5ML PO SOLN: 5.0000 mg | Freq: Once | ORAL | Status: DC | PRN

## 2022-01-15 MED ORDER — LIDOCAINE HCL (PF) 2 % IJ SOLN
INTRAMUSCULAR | Status: AC
  Filled 2022-01-15: qty 5

## 2022-01-15 MED ORDER — ONDANSETRON HCL 4 MG/2ML IJ SOLN
INTRAMUSCULAR | Status: DC | PRN
  Administered 2022-01-15: 4 mg via INTRAVENOUS

## 2022-01-15 MED ORDER — LACTATED RINGERS IV SOLN: INTRAVENOUS | Status: DC

## 2022-01-15 MED ORDER — SODIUM CHLORIDE 0.9 % IV SOLN
2.0000 g | Freq: Two times a day (BID) | INTRAVENOUS | Status: AC
  Administered 2022-01-15: 2 g via INTRAVENOUS
  Filled 2022-01-15: qty 2

## 2022-01-15 MED ORDER — SUGAMMADEX SODIUM 200 MG/2ML IV SOLN
INTRAVENOUS | Status: DC | PRN
  Administered 2022-01-15: 200 mg via INTRAVENOUS

## 2022-01-15 MED ORDER — ACETAMINOPHEN 160 MG/5ML PO SOLN: 1000.0000 mg | Freq: Once | ORAL | Status: DC | PRN

## 2022-01-15 MED ORDER — BUPIVACAINE LIPOSOME 1.3 % IJ SUSP
INTRAMUSCULAR | Status: AC
  Filled 2022-01-15: qty 20

## 2022-01-15 MED ORDER — ENSURE SURGERY PO LIQD
237.0000 mL | Freq: Two times a day (BID) | ORAL | Status: DC
  Administered 2022-01-15 – 2022-01-19 (×8): 237 mL via ORAL

## 2022-01-15 MED ORDER — ACETAMINOPHEN 500 MG PO TABS: 1000.0000 mg | ORAL_TABLET | Freq: Once | ORAL | Status: DC | PRN

## 2022-01-15 MED ORDER — MIDAZOLAM HCL 2 MG/2ML IJ SOLN
INTRAMUSCULAR | Status: AC
  Filled 2022-01-15: qty 2

## 2022-01-15 MED ORDER — OXYCODONE HCL 5 MG PO TABS: 5.0000 mg | ORAL_TABLET | Freq: Once | ORAL | Status: DC | PRN

## 2022-01-15 MED ORDER — DEXAMETHASONE SODIUM PHOSPHATE 10 MG/ML IJ SOLN
INTRAMUSCULAR | Status: DC | PRN
  Administered 2022-01-15: 8 mg via INTRAVENOUS

## 2022-01-15 MED ORDER — ALVIMOPAN 12 MG PO CAPS
12.0000 mg | ORAL_CAPSULE | Freq: Two times a day (BID) | ORAL | Status: DC
  Administered 2022-01-16: 12 mg via ORAL
  Filled 2022-01-15: qty 1

## 2022-01-15 MED ORDER — BISACODYL 5 MG PO TBEC: 20.0000 mg | DELAYED_RELEASE_TABLET | Freq: Once | ORAL | Status: DC

## 2022-01-15 MED ORDER — FENTANYL CITRATE PF 50 MCG/ML IJ SOSY
25.0000 ug | PREFILLED_SYRINGE | INTRAMUSCULAR | Status: DC | PRN
  Administered 2022-01-15: 50 ug via INTRAVENOUS

## 2022-01-15 MED ORDER — BUPIVACAINE-EPINEPHRINE (PF) 0.5% -1:200000 IJ SOLN
INTRAMUSCULAR | Status: DC | PRN
  Administered 2022-01-15: 30 mL

## 2022-01-15 MED ORDER — INDOCYANINE GREEN 25 MG IV SOLR
INTRAVENOUS | Status: DC | PRN
  Administered 2022-01-15: 2.5 mg via INTRAVENOUS

## 2022-01-15 MED ORDER — BUPIVACAINE LIPOSOME 1.3 % IJ SUSP
INTRAMUSCULAR | Status: DC | PRN
  Administered 2022-01-15: 20 mL

## 2022-01-15 MED ORDER — EPHEDRINE 5 MG/ML INJ
INTRAVENOUS | Status: AC
  Filled 2022-01-15: qty 5

## 2022-01-15 MED ORDER — MORPHINE SULFATE (PF) 2 MG/ML IV SOLN
2.0000 mg | INTRAVENOUS | Status: DC | PRN
  Administered 2022-01-15 – 2022-01-18 (×18): 2 mg via INTRAVENOUS
  Filled 2022-01-15 (×19): qty 1

## 2022-01-15 MED ORDER — ACETAMINOPHEN 10 MG/ML IV SOLN: 1000.0000 mg | Freq: Once | INTRAVENOUS | Status: DC | PRN

## 2022-01-15 MED ORDER — ENOXAPARIN SODIUM 40 MG/0.4ML IJ SOSY
40.0000 mg | PREFILLED_SYRINGE | INTRAMUSCULAR | Status: DC
  Administered 2022-01-16 – 2022-01-19 (×4): 40 mg via SUBCUTANEOUS
  Filled 2022-01-15 (×4): qty 0.4

## 2022-01-15 MED ORDER — METHYLENE BLUE 1 % INJ SOLN
INTRAVENOUS | Status: AC
  Filled 2022-01-15: qty 10

## 2022-01-15 MED ORDER — ROCURONIUM BROMIDE 10 MG/ML (PF) SYRINGE
PREFILLED_SYRINGE | INTRAVENOUS | Status: DC | PRN
  Administered 2022-01-15: 70 mg via INTRAVENOUS
  Administered 2022-01-15: 30 mg via INTRAVENOUS
  Administered 2022-01-15: 10 mg via INTRAVENOUS

## 2022-01-15 MED ORDER — ONDANSETRON HCL 4 MG PO TABS: 4.0000 mg | ORAL_TABLET | Freq: Four times a day (QID) | ORAL | Status: DC | PRN

## 2022-01-15 MED ORDER — LACTATED RINGERS IV SOLN: INTRAVENOUS | Status: DC | PRN

## 2022-01-15 MED ORDER — TRAMADOL HCL 50 MG PO TABS
50.0000 mg | ORAL_TABLET | Freq: Four times a day (QID) | ORAL | Status: DC | PRN
  Administered 2022-01-15 – 2022-01-16 (×4): 100 mg via ORAL
  Filled 2022-01-15 (×4): qty 2

## 2022-01-15 MED ORDER — LIDOCAINE HCL 2 % IJ SOLN
INTRAMUSCULAR | Status: AC
  Filled 2022-01-15: qty 20

## 2022-01-15 MED ORDER — KCL IN DEXTROSE-NACL 20-5-0.45 MEQ/L-%-% IV SOLN
INTRAVENOUS | Status: DC
  Filled 2022-01-15 (×2): qty 1000

## 2022-01-15 MED ORDER — BUPIVACAINE LIPOSOME 1.3 % IJ SUSP: 20.0000 mL | Freq: Once | INTRAMUSCULAR | Status: DC

## 2022-01-15 MED ORDER — 0.9 % SODIUM CHLORIDE (POUR BTL) OPTIME
TOPICAL | Status: DC | PRN
  Administered 2022-01-15: 2000 mL

## 2022-01-15 MED ORDER — STERILE WATER FOR IRRIGATION IR SOLN
Status: DC | PRN
  Administered 2022-01-15: 1000 mL

## 2022-01-15 MED ORDER — FENTANYL CITRATE PF 50 MCG/ML IJ SOSY
PREFILLED_SYRINGE | INTRAMUSCULAR | Status: AC
  Administered 2022-01-15: 50 ug via INTRAVENOUS
  Filled 2022-01-15: qty 1

## 2022-01-15 MED ORDER — LACTATED RINGERS IR SOLN
Status: DC | PRN
  Administered 2022-01-15: 1000 mL

## 2022-01-15 MED ORDER — ORAL CARE MOUTH RINSE: 15.0000 mL | Freq: Once | OROMUCOSAL | Status: AC

## 2022-01-15 MED ORDER — AMISULPRIDE (ANTIEMETIC) 5 MG/2ML IV SOLN
INTRAVENOUS | Status: AC
  Administered 2022-01-15: 10 mg via INTRAVENOUS
  Filled 2022-01-15: qty 4

## 2022-01-15 SURGICAL SUPPLY — 95 items
BAG COUNTER SPONGE SURGICOUNT (BAG) ×1 IMPLANT
BLADE EXTENDED COATED 6.5IN (ELECTRODE) IMPLANT
CANNULA REDUC XI 12-8 STAPL (CANNULA)
CANNULA REDUCER 12-8 DVNC XI (CANNULA) IMPLANT
CATH ROBINSON RED A/P 16FR (CATHETERS) IMPLANT
CELLS DAT CNTRL 66122 CELL SVR (MISCELLANEOUS) IMPLANT
COVER SURGICAL LIGHT HANDLE (MISCELLANEOUS) ×2 IMPLANT
COVER TIP SHEARS 8 DVNC (MISCELLANEOUS) ×1 IMPLANT
COVER TIP SHEARS 8MM DA VINCI (MISCELLANEOUS) ×1
DERMABOND ADVANCED .7 DNX12 (GAUZE/BANDAGES/DRESSINGS) IMPLANT
DRAIN CHANNEL 19F RND (DRAIN) IMPLANT
DRAPE ARM DVNC X/XI (DISPOSABLE) ×4 IMPLANT
DRAPE COLUMN DVNC XI (DISPOSABLE) ×1 IMPLANT
DRAPE DA VINCI XI ARM (DISPOSABLE) ×4
DRAPE DA VINCI XI COLUMN (DISPOSABLE) ×1
DRAPE SURG IRRIG POUCH 19X23 (DRAPES) ×1 IMPLANT
DRSG OPSITE POSTOP 4X10 (GAUZE/BANDAGES/DRESSINGS) IMPLANT
DRSG OPSITE POSTOP 4X6 (GAUZE/BANDAGES/DRESSINGS) IMPLANT
DRSG OPSITE POSTOP 4X8 (GAUZE/BANDAGES/DRESSINGS) IMPLANT
ELECT PENCIL ROCKER SW 15FT (MISCELLANEOUS) ×1 IMPLANT
ELECT REM PT RETURN 15FT ADLT (MISCELLANEOUS) ×1 IMPLANT
ENDOLOOP SUT PDS II  0 18 (SUTURE)
ENDOLOOP SUT PDS II 0 18 (SUTURE) IMPLANT
EVACUATOR SILICONE 100CC (DRAIN) IMPLANT
GLOVE BIO SURGEON STRL SZ 6.5 (GLOVE) ×3 IMPLANT
GLOVE BIOGEL PI IND STRL 7.0 (GLOVE) ×2 IMPLANT
GLOVE INDICATOR 6.5 STRL GRN (GLOVE) ×1 IMPLANT
GOWN SRG XL LVL 4 BRTHBL STRL (GOWNS) ×1 IMPLANT
GOWN STRL NON-REIN XL LVL4 (GOWNS) ×1
GOWN STRL REUS W/ TWL XL LVL3 (GOWN DISPOSABLE) ×3 IMPLANT
GOWN STRL REUS W/TWL XL LVL3 (GOWN DISPOSABLE) ×3
GRASPER SUT TROCAR 14GX15 (MISCELLANEOUS) IMPLANT
HOLDER FOLEY CATH W/STRAP (MISCELLANEOUS) ×1 IMPLANT
IRRIG SUCT STRYKERFLOW 2 WTIP (MISCELLANEOUS) ×1
IRRIGATION SUCT STRKRFLW 2 WTP (MISCELLANEOUS) ×1 IMPLANT
KIT PROCEDURE DA VINCI SI (MISCELLANEOUS)
KIT PROCEDURE DVNC SI (MISCELLANEOUS) IMPLANT
KIT TURNOVER KIT A (KITS) IMPLANT
NDL INSUFFLATION 14GA 120MM (NEEDLE) ×1 IMPLANT
NEEDLE INSUFFLATION 14GA 120MM (NEEDLE) ×1 IMPLANT
PACK CARDIOVASCULAR III (CUSTOM PROCEDURE TRAY) ×1 IMPLANT
PACK COLON (CUSTOM PROCEDURE TRAY) ×1 IMPLANT
PAD POSITIONING PINK XL (MISCELLANEOUS) ×1 IMPLANT
RELOAD STAPLE 60 3.5 BLU DVNC (STAPLE) IMPLANT
RELOAD STAPLE 60 4.3 GRN DVNC (STAPLE) IMPLANT
RELOAD STAPLER 3.5X60 BLU DVNC (STAPLE) IMPLANT
RELOAD STAPLER 4.3X60 GRN DVNC (STAPLE) ×2 IMPLANT
RETRACTOR WND ALEXIS 18 MED (MISCELLANEOUS) IMPLANT
RTRCTR WOUND ALEXIS 18CM MED (MISCELLANEOUS)
SCISSORS LAP 5X35 DISP (ENDOMECHANICALS) IMPLANT
SEAL CANN UNIV 5-8 DVNC XI (MISCELLANEOUS) ×3 IMPLANT
SEAL XI 5MM-8MM UNIVERSAL (MISCELLANEOUS) ×3
SEALER VESSEL DA VINCI XI (MISCELLANEOUS) ×1
SEALER VESSEL EXT DVNC XI (MISCELLANEOUS) ×1 IMPLANT
SOLUTION ELECTROLUBE (MISCELLANEOUS) ×1 IMPLANT
SPIKE FLUID TRANSFER (MISCELLANEOUS) IMPLANT
STAPLER 60 DA VINCI SURE FORM (STAPLE) ×1
STAPLER 60 SUREFORM DVNC (STAPLE) IMPLANT
STAPLER CANNULA SEAL DVNC XI (STAPLE) IMPLANT
STAPLER CANNULA SEAL XI (STAPLE)
STAPLER ECHELON POWER CIR 29 (STAPLE) IMPLANT
STAPLER ECHELON POWER CIR 31 (STAPLE) IMPLANT
STAPLER RELOAD 3.5X60 BLU DVNC (STAPLE)
STAPLER RELOAD 3.5X60 BLUE (STAPLE)
STAPLER RELOAD 4.3X60 GREEN (STAPLE) ×2
STAPLER RELOAD 4.3X60 GRN DVNC (STAPLE) ×2
STOPCOCK 4 WAY LG BORE MALE ST (IV SETS) ×2 IMPLANT
SUT ETHILON 2 0 PS N (SUTURE) IMPLANT
SUT NOVA NAB GS-21 1 T12 (SUTURE) ×2 IMPLANT
SUT PROLENE 2 0 KS (SUTURE) IMPLANT
SUT SILK 2 0 (SUTURE) ×1
SUT SILK 2 0 SH CR/8 (SUTURE) IMPLANT
SUT SILK 2-0 18XBRD TIE 12 (SUTURE) ×1 IMPLANT
SUT SILK 3 0 (SUTURE)
SUT SILK 3 0 SH CR/8 (SUTURE) ×1 IMPLANT
SUT SILK 3-0 18XBRD TIE 12 (SUTURE) IMPLANT
SUT V-LOC BARB 180 2/0GR6 GS22 (SUTURE)
SUT VIC AB 2-0 SH 18 (SUTURE) IMPLANT
SUT VIC AB 2-0 SH 27 (SUTURE)
SUT VIC AB 2-0 SH 27X BRD (SUTURE) IMPLANT
SUT VIC AB 3-0 SH 18 (SUTURE) IMPLANT
SUT VIC AB 4-0 PS2 27 (SUTURE) ×2 IMPLANT
SUT VICRYL 0 UR6 27IN ABS (SUTURE) ×1 IMPLANT
SUTURE V-LC BRB 180 2/0GR6GS22 (SUTURE) IMPLANT
SYR 20ML ECCENTRIC (SYRINGE) ×1 IMPLANT
SYS LAPSCP GELPORT 120MM (MISCELLANEOUS)
SYS WOUND ALEXIS 18CM MED (MISCELLANEOUS)
SYSTEM LAPSCP GELPORT 120MM (MISCELLANEOUS) IMPLANT
SYSTEM WOUND ALEXIS 18CM MED (MISCELLANEOUS) IMPLANT
TOWEL OR 17X26 10 PK STRL BLUE (TOWEL DISPOSABLE) IMPLANT
TOWEL OR NON WOVEN STRL DISP B (DISPOSABLE) ×1 IMPLANT
TRAY FOLEY MTR SLVR 16FR STAT (SET/KITS/TRAYS/PACK) ×1 IMPLANT
TROCAR ADV FIXATION 5X100MM (TROCAR) ×1 IMPLANT
TUBING CONNECTING 10 (TUBING) ×2 IMPLANT
TUBING INSUFFLATION 10FT LAP (TUBING) ×1 IMPLANT

## 2022-01-15 NOTE — Consult Note (Signed)
[facility] Nurse Consult Note: COnsult received for preoperative stoma site selection by Dr., Marcello Moores. This marking was completed on 01/06/22. Please see note from that encounter.  [place] nursing team will follow along with you if a stoma is created intraoperatively today, and will remain available to this patient, the nursing, surgery, and medical teams.    Thank you for inviting [place] to participate in this patient's Plan of Care.  Maudie Flakes, MSN, RN, CNS, [place], Serita Grammes, [employer], [employer] phone:  [phone]

## 2022-01-15 NOTE — Consult Note (Signed)
[place] Nurse ostomy consult note Pt had ileostomy surgery performed today. [place] team will see tomorrow to begin pouch change demonstrations and teaching sessions. Thank-you,  Julien Girt MSN, [place], [place], [place], [place]

## 2022-01-15 NOTE — H&P (Signed)
PROVIDER:  Monico Blitz, MD   MRN: [id number] DOB: 1986/02/02   Subjective    Chief Complaint: Follow-up       History of Present Illness: Jeremiah Allen is a 36 y.o. male who is seen today as an office consultation at the request of Dr. Pasty Arch for evaluation of Follow-up .  Patient presented to [facility] with rectal bleeding and abdominal pain.  CT scan showed rectal wall thickening and perirectal lymphadenopathy as well as indeterminate liver lesions.  Patient underwent a flexible sigmoidoscopy.  The mass was noted approximately 4 cm from the anal verge.  This was circumferential.  Biopsies were taken.  A complete colonoscopy was not done due to no bowel prep.  MRI of the pelvis was performed on 04/17/2021 and this showed a T4aN2 rectal cancer with liver lesions consistent with metastatic disease.  He was seen by oncology, Dr. Benay Spice and started on chemotherapy.  He has now completed 6 cycles of FOLFOX.         Review of Systems: A complete review of systems was obtained from the patient.  I have reviewed this information and discussed as appropriate with the patient.  See HPI as well for other ROS.     Medical History:     Past Medical History:  Diagnosis Date   Anxiety     History of cancer           Patient Active Problem List  Diagnosis   Rectal cancer metastasized to liver (CMS-HCC)           Past Surgical History:  Procedure Laterality Date   TONSILLECTOMY   1996   FLEXIBLE SIGMOIDOSCOPY   04/16/2021    (Inpatient) INVASIVE MODERATELY DIFFERENTIATED/Repeat 69yrTKT           Allergies  Allergen Reactions   Dilaudid [Hydromorphone] Other (See Comments)      Skin tingling sensations             Current Outpatient Medications on File Prior to Visit  Medication Sig Dispense Refill   acetaminophen (TYLENOL) 500 MG tablet Take by mouth       ARIPiprazole (ABILIFY) 2 MG tablet Take 5 mg by mouth once daily       gabapentin (NEURONTIN) 300 MG capsule  Take 600 mg by mouth 3 (three) times daily       hydrOXYzine HCL (ATARAX) 10 MG tablet Take 25 mg by mouth       traMADoL (ULTRAM) 50 mg tablet Take 50 mg by mouth every 8 (eight) hours as needed        No current facility-administered medications on file prior to visit.           Family History  Problem Relation Age of Onset   High blood pressure (Hypertension) Mother        Social History        Tobacco Use  Smoking Status Some Days   Types: Cigarettes  Smokeless Tobacco Current      Social History         Socioeconomic History   Marital status: Single  Tobacco Use   Smoking status: Some Days      Types: Cigarettes   Smokeless tobacco: Current  Substance and Sexual Activity   Alcohol use: Never   Drug use: Never      Objective:      BP 109/71   Pulse 70   Temp 98.1 F (36.7 C) (Oral)   Resp  16   Ht '5\' 9"'$  (1.753 m)   Wt 72.8 kg   SpO2 97%   BMI 23.70 kg/m      Exam Gen: NAD CV: RRR Lungs: non-labored breathing Abd: soft      Labs, Imaging and Diagnostic Testing: Initial MRI of the pelvis shows a T4 lesion with extension 10 mm beyond the rectal wall with involvement of the left anterior peritoneal reflection.  Images personally reviewed.  This appears to be mostly left posterior with the tip of the tumor ending at the coccyx.  There is approximately 6 cm of rectum beyond the tumor to the anal sphincters.  There is no extra mesorectal lymph node involvement.   Follow-up CT scan performed 07/17/2021 shows decreased rectal wall thickening as well as decreased size of the hepatic metastases and perirectal lymph nodes consistent with treatment response.  No convincing evidence of progressive metastatic disease in the chest abdomen or pelvis. F/U MR images reviewed. Decreased tumor size by my read with ~7cm to IAS Assessment and Plan:   36 year old male with advanced rectal cancer with metastatic disease, s/p liver resection.  Patient's original MRI appears  to be resectable.  Repeat MRI shows decrease in tumor size.  I have recommended low anterior resection with diverting ileostomy.  We have discussed this in detail.  I recommend that he stop or significantly reduce his smoking around the time of surgery to prevent complications to his anastomosis.  We discussed that if his anastomosis fails, he will need a permanent colostomy.  His last radiation treatment was the first week of October.  We will plan on doing a surgery in late December or early January.   The surgery and anatomy were described to the patient as well as the risks of surgery and the possible complications.  These include: Bleeding, deep abdominal infections and possible wound complications such as hernia and infection, damage to adjacent structures, leak of surgical connections, which can lead to other surgeries and possibly an ostomy, possible need for other procedures, such as abscess drains in radiology, possible prolonged hospital stay, possible diarrhea from removal of part of the colon, possible constipation from narcotics, possible bowel, bladder or sexual dysfunction if having rectal surgery, prolonged fatigue/weakness or appetite loss, possible early recurrence of of disease, possible complications of their medical problems such as heart disease or arrhythmias or lung problems, death (less than 1%). I believe the patient understands and wishes to proceed with the surgery.        No follow-ups on file.    Rosario Adie, MD Colon and Rectal Surgery [facility]

## 2022-01-15 NOTE — Anesthesia Procedure Notes (Signed)
Procedure Name: Intubation Date/Time: 01/15/2022 7:37 AM  Performed by: Victoriano Lain, CRNAPre-anesthesia Checklist: Patient identified, Emergency Drugs available, Suction available, Patient being monitored and Timeout performed Patient Re-evaluated:Patient Re-evaluated prior to induction Oxygen Delivery Method: Circle system utilized Preoxygenation: Pre-oxygenation with 100% oxygen Induction Type: IV induction Ventilation: Mask ventilation without difficulty Laryngoscope Size: Mac and 4 Grade View: Grade I Tube type: Oral Tube size: 7.5 mm Number of attempts: 1 Airway Equipment and Method: Stylet Placement Confirmation: ETT inserted through vocal cords under direct vision, positive ETCO2 and breath sounds checked- equal and bilateral Secured at: 21 cm Tube secured with: Tape Dental Injury: Teeth and Oropharynx as per pre-operative assessment

## 2022-01-15 NOTE — Transfer of Care (Signed)
Immediate Anesthesia Transfer of Care Note  Patient: Jeremiah Allen  Procedure(s) Performed: Procedure(s): XI ROBOTIC ASSISTED LOWER ANTERIOR RESECTION (N/A) DIVERTING ILEOSTOMY (N/A)  Patient Location: PACU  Anesthesia Type:General  Level of Consciousness:  sedated, patient cooperative and responds to stimulation  Airway & Oxygen Therapy:Patient Spontanous Breathing and Patient connected to face mask oxgen  Post-op Assessment:  Report given to PACU RN and Post -op Vital signs reviewed and stable  Post vital signs:  Reviewed and stable  Last Vitals:  Vitals:   01/15/22 0559  BP: 109/71  Pulse: 70  Resp: 16  Temp: 36.7 C  SpO2: 49%    Complications: No apparent anesthesia complications

## 2022-01-15 NOTE — Anesthesia Preprocedure Evaluation (Addendum)
Anesthesia Evaluation  Patient identified by MRN, date of birth, ID band Patient awake    Reviewed: Allergy & Precautions, H&P , NPO status , Patient's Chart, lab work & pertinent test results  Airway Mallampati: II  TM Distance: >3 FB Neck ROM: Full    Dental  (+) Teeth Intact, Dental Advisory Given   Pulmonary neg shortness of breath, neg sleep apnea, neg COPD, neg recent URI, Current Smoker and Patient abstained from smoking.   breath sounds clear to auscultation       Cardiovascular negative cardio ROS  Rhythm:Regular     Neuro/Psych  PSYCHIATRIC DISORDERS Anxiety  Bipolar Disorder   negative neurological ROS     GI/Hepatic ,GERD  ,,S/p resection of liver mets rectal cancer   Endo/Other  negative endocrine ROS    Renal/GU negative Renal ROSLab Results      Component                Value               Date                      CREATININE               0.91                01/15/2022                Musculoskeletal negative musculoskeletal ROS (+)    Abdominal   Peds  Hematology negative hematology ROS (+) Lab Results      Component                Value               Date                      WBC                      3.2 (L)             01/06/2022                HGB                      13.6                01/06/2022                HCT                      41.0                01/06/2022                MCV                      97.6                01/06/2022                PLT                      179                 01/06/2022              Anesthesia Other Findings   Reproductive/Obstetrics  negative OB ROS                             Anesthesia Physical Anesthesia Plan  ASA: 2  Anesthesia Plan: General   Post-op Pain Management: Tylenol PO (pre-op)*, Toradol IV (intra-op)* and Ketamine IV*   Induction: Intravenous  PONV Risk Score and Plan: 1 and Ondansetron, Dexamethasone and  Midazolam  Airway Management Planned: Oral ETT  Additional Equipment: None  Intra-op Plan:   Post-operative Plan: Extubation in OR  Informed Consent: I have reviewed the patients History and Physical, chart, labs and discussed the procedure including the risks, benefits and alternatives for the proposed anesthesia with the patient or authorized representative who has indicated his/her understanding and acceptance.     Dental advisory given  Plan Discussed with: CRNA  Anesthesia Plan Comments:        Anesthesia Quick Evaluation

## 2022-01-15 NOTE — Op Note (Signed)
01/15/2022  10:35 AM  PATIENT:  Jeremiah Allen  36 y.o. male  Patient Care Team: Hayden Rasmussen, MD as PCP - General (Family Medicine) Clent Jacks, RN as Oncology Nurse Navigator [place]el[place], Mindi Slicker, RN as Oncology Nurse Navigator  PRE-OPERATIVE DIAGNOSIS:  Rectal cancer  POST-OPERATIVE DIAGNOSIS:  Rectal cancer  PROCEDURE: XI ROBOTIC ASSISTED LOWER ANTERIOR RESECTION DIVERTING ILEOSTOMY INTRAOPERATIVE ASSESSMENT OF PERFUSION   Surgeon(s): Leighton Ruff, MD Michael Boston, MD  ASSISTANT: none   ANESTHESIA:   local and general  EBL:25m  Total I/O In: 1500 [I.V.:1400; IV Piggyback:100] Out: 175 [Urine:150; Blood:25]  Delay start of Pharmacological VTE agent (>24hrs) due to surgical blood loss or risk of bleeding:  no  DRAINS: (27F) Jackson-Pratt drain(s) with closed bulb suction in the pelvis    SPECIMEN:  Source of Specimen:  rectum  DISPOSITION OF SPECIMEN:  PATHOLOGY  COUNTS:  YES  PLAN OF CARE: Admit to inpatient   PATIENT DISPOSITION:  PACU - hemodynamically stable.  INDICATION:    36y.o. M with T4N2 Rectal cancer s/p TNT.  I recommended low anterior resection:  The anatomy & physiology of the digestive tract was discussed.  The pathophysiology was discussed.  Natural history risks without surgery was discussed.   I worked to give an overview of the disease and the frequent need to have multispecialty involvement.  I feel the risks of no intervention will lead to serious problems that outweigh the operative risks; therefore, I recommended a partial colectomy to remove the pathology.  Laparoscopic & open techniques were discussed.   Risks such as bleeding, infection, abscess, leak, reoperation, possible ostomy, hernia, heart attack, death, and other risks were discussed.  I noted a good likelihood this will help address the problem.   Goals of post-operative recovery were discussed as well.    The patient expressed understanding & wished to proceed with  surgery.  OR FINDINGS:   Patient had a mass at the peritoneal reflection ~7cm from the anal verge  No obvious metastatic disease on visceral parietal peritoneum or liver.  The anastomosis rests 4 cm from the anal verge by rigid proctoscopy.  DESCRIPTION:   Informed consent was confirmed.  The patient underwent general anaesthesia without difficulty.  The patient was positioned appropriately.  VTE prevention in place.  The patient's abdomen was clipped, prepped, & draped in a sterile fashion.  Surgical timeout confirmed our plan.  The patient was positioned in reverse Trendelenburg.  Abdominal entry was gained using a Varies needle in the LUQ.  Entry was clean.  I induced carbon dioxide insufflation.  An 867mrobotic port was placed in the RLQ.  Camera inspection revealed no injury.  Extra ports were carefully placed under direct laparoscopic visualization.  I laparoscopically reflected the greater omentum and the upper abdomen the small bowel in the upper abdomen. The patient was appropriately positioned and the robot was docked to the patient's left side.  Instruments were placed under direct visualization.    I mobilized the sigmoid colon off of the pelvic sidewall.  I scored the base of peritoneum of the right side of the mesentery of the left colon from the ligament of Treitz to the peritoneal reflection of the mid rectum.  I elevated the sigmoid mesentery and enetered into the retro-mesenteric plane.  I continued my dissection distally down into the posterior mesorectum.  Attachments were divided with the robotic vessel sealer.  I continued down to the lateral wall of the coccyx.  I continued my  dissection out laterally on both sides.  We were able to identify the left ureter and gonadal vessels. We kept those posterior within the retroperitoneum and elevated the left colon mesentery off that. I did isolated IMA pedicle but did not ligate it yet.  I continued distally and got into the avascular  plane posterior to the mesorectum. This allowed me to help mobilize the rectum as well by freeing the mesorectum off the sacrum.  I mobilized the peritoneal coverings towards the peritoneal reflection on both the right and left sides of the rectum.  I could see the right and left ureters and stayed away from them.    I skeletonized the inferior mesenteric artery pedicle.  I went down to its takeoff from the aorta.  After confirming the left ureter was out of the way, I went ahead and ligated the inferior mesenteric artery pedicle with bipolar robotic vessel sealer ~2cm above its takeoff from the aorta.  We ensured hemostasis. I mobilized the left colon in a lateral to medial fashion off the line of Toldt up towards the splenic flexure to ensure good mobilization of the left colon to reach into the pelvis.  I then completed the anterior portion of the dissection by dividing the peritoneal reflection with the robotic vessel sealer.  I then carefully bluntly mobilized the rectum off of the seminal vesicles and prostate.  There was quite a bit of oozing in this area due to the previous radiation injuries.  Once I was down to the pelvic floor circumferentially, I evaluated the patient's rectum with a digital rectal exam.  The mass was palpated approximately 7 cm from the anal verge.  Identified a distal margin and the mesentery around this area was cleared using the robotic vessel sealer.  Once this was completed, firefly was injected intravenously.  I identified good perfusion in the distal rectum and to the sigmoid as well.  2 loads of a 60 mm green load robotic stapler were used to transect at this level.   At this point the robot was undocked.  The ileostomy site was created at the 12 mm port site.  Cruciate incision was made in the fascia.  The peritoneum was entered bluntly and enlarged.  An [place] wound protector was placed.  The rectum was brought out through the wound protector and transected at the  previously dissected margin.  A 29 mm EEA stapler was placed through this transection and a 2-0 Prolene pursestring placed through a pursestring device was tied tightly around the anvil.  This was secured with 3-0 silk sutures.  The fat around the plate and the anvil was cleared using electrocautery.  This was then placed back into the abdomen.  An anastomosis was created under laparoscopic visualization.  There was no tension noted on the anastomosis.  There was no leak when tested with insufflation under irrigation.  The terminal ileum was then brought through the extraction site and the [facility] wound protector was removed.  This was oriented and secured.  A 19 [place] Blake drain was placed into the pelvis and brought out through the 5 mm right lower quadrant port site.  This was secured with a 2-0 nylon suture.  The remaining port sites were removed.  The lateral edge of the fascia of the ileostomy site was closed using a figure-of-eight #1 Novafil suture.  The port sites were closed using 4-0 Vicryl subcuticular sutures and Dermabond.  I then matured the ileostomy in standard [place] fashion using interrupted 3-0  Vicryl sutures.  A red rubber catheter was used as support for the ileostomy.  Once this was completed I checked to make sure the ileostomy was patent.  An ostomy appliance was then placed.  Additional dressings were placed and the patient was then awakened from anesthesia and sent to the postanesthesia care unit in stable condition.  All counts were correct per operating room staff.  Rosario Adie, MD  Col[facility] and [facility]   An MD assistant was necessary for tissue manipulation, retraction and positioning due to the complexity of the case and hospital policies

## 2022-01-16 ENCOUNTER — Telehealth: Payer: Self-pay | Admitting: *Deleted

## 2022-01-16 ENCOUNTER — Encounter (HOSPITAL_COMMUNITY): Payer: Self-pay | Admitting: General Surgery

## 2022-01-16 LAB — CBC
HCT: 36.1 % — ABNORMAL LOW (ref 39.0–52.0)
Hemoglobin: 11.9 g/dL — ABNORMAL LOW (ref 13.0–17.0)
MCH: 32.7 pg (ref 26.0–34.0)
MCHC: 33 g/dL (ref 30.0–36.0)
MCV: 99.2 fL (ref 80.0–100.0)
Platelets: 162 10*3/uL (ref 150–400)
RBC: 3.64 MIL/uL — ABNORMAL LOW (ref 4.22–5.81)
RDW: 12 % (ref 11.5–15.5)
WBC: 6.7 10*3/uL (ref 4.0–10.5)
nRBC: 0 % (ref 0.0–0.2)

## 2022-01-16 LAB — BASIC METABOLIC PANEL
Anion gap: 8 (ref 5–15)
BUN: 5 mg/dL — ABNORMAL LOW (ref 6–20)
CO2: 24 mmol/L (ref 22–32)
Calcium: 8.5 mg/dL — ABNORMAL LOW (ref 8.9–10.3)
Chloride: 106 mmol/L (ref 98–111)
Creatinine, Ser: 0.84 mg/dL (ref 0.61–1.24)
GFR, Estimated: >60 mL/min (ref >60)
Glucose, Bld: 110 mg/dL — ABNORMAL HIGH (ref 70–99)
Potassium: 4 mmol/L (ref 3.5–5.1)
Sodium: 138 mmol/L (ref 135–145)

## 2022-01-16 NOTE — Consult Note (Signed)
[place] Nurse ostomy follow up Pt had ileostomy surgery performed yesterday.  Pouch change demonstration performed with patient and fiance present to watch the procedure.  Stoma type/location: Stoma is red and viable, 1 1/2 inches, above skin level, red rubber rod in place.  Peristomal assessment: intact skin surrounding Output : 40cc liquid brown stool Ostomy pouching: 2pc.  Education provided:  Demonstrated pouch change using barrier ring and 2 piece pouching system.  Pt was able to stretch the barrier ring and apply to the back of the pouch and clip together the 2 piece pouching system, using a hand held mirror.  He was able to open and close the velcro to empty and asked appropriate questions.  Reviewed pouching routines and ordering supplies.  5 sets of supplies left at the bedside, along with educational materials. Use supplies: barrier ring, Lawson # [phone], wafer Kellie Simmering # [id number], pouch Lawson # 24 Enrolled patient in [facility] Discharge program: Yes, today [place] team will perform another teaching session on Monday. Thank-you,  Julien Girt MSN, [place], [place], [place], [place]

## 2022-01-16 NOTE — Anesthesia Postprocedure Evaluation (Signed)
Anesthesia Post Note  Patient: Jeremiah Allen  Procedure(s) Performed: XI ROBOTIC ASSISTED LOWER ANTERIOR RESECTION DIVERTING ILEOSTOMY     Patient location during evaluation: PACU Anesthesia Type: General Level of consciousness: awake and alert Pain management: pain level controlled Vital Signs Assessment: post-procedure vital signs reviewed and stable Respiratory status: spontaneous breathing, nonlabored ventilation, respiratory function stable and patient connected to nasal cannula oxygen Cardiovascular status: blood pressure returned to baseline and stable Postop Assessment: no apparent nausea or vomiting Anesthetic complications: no  No notable events documented.  Last Vitals:  Vitals:   01/16/22 0844 01/16/22 1120  BP: (!) 113/92 117/63  Pulse: 67 89  Resp: 18 18  Temp: 36.8 C 36.7 C  SpO2: 98% 98%    Last Pain:  Vitals:   01/16/22 1658  TempSrc:   PainSc: 7                  Tonjia Parillo

## 2022-01-16 NOTE — TOC Initial Note (Signed)
Transition of Care [facility]) - Initial/Assessment Note   Patient Details  Name: Jeremiah Allen MRN: [id number] Date of Birth: 1985-11-27  [facility]) CM/SW Contact:    Sherie Don, LCSW Phone Number: 01/16/2022, 12:02 PM  Clinical Narrative: Patient is a new ostomy and will need HHRN after discharge. [facility], [facility], and [facility] are out of network with managed Medicaid. CSW made [facility] referrals to the following [facility] agencies:  [facility]: declined [facility]: declined [facility]: declined [facility]: declined [facility]: declined [facility]: declined [facility]: accepted  Patient will need HHRN orders placed prior to discharge. CSW notified triage at [place] that [facility] orders would be needed. CSW updated patient regarding HHRN being set up for the new ostomy.  Expected Discharge Plan: [place] Barriers to Discharge: Continued Medical Work up  Patient Goals and CMS Choice Patient states their goals for this hospitalization and ongoing recovery are:: Return home CMS Medicare.gov Compare Post Acute Care list provided to:: Patient Choice offered to / list presented to : Patient  Expected Discharge Plan and Services Expected Discharge Plan: [place] In-house Referral: Clinical Social Work Post Acute Care Choice: [place] arrangements for the past 2 months: Apartment              DME Arranged: N/A DME Agency: NA HH Arranged: RN [place] Agency: [facility] Date [place]: 01/16/22 Time [place]: 88 Representative spoke with at [place]: [place] Ar  Prior Living Arrangements/Services Living arrangements for the past 2 months: Apartment Patient language and need for interpreter reviewed:: Yes Do you feel safe going back to the place where you live?: Yes      Need for Family Participation in Patient Care: No (Comment) Care giver support system in place?: Yes (comment) Criminal Activity/Legal Involvement Pertinent to  Current Situation/Hospitalization: No - Comment as needed  Activities of Daily Living Home Assistive Devices/Equipment: None ADL Screening (condition at time of admission) Patient's cognitive ability adequate to safely complete daily activities?: Yes Is the patient deaf or have difficulty hearing?: No Does the patient have difficulty seeing, even when wearing glasses/contacts?: No Does the patient have difficulty concentrating, remembering, or making decisions?: No Patient able to express need for assistance with ADLs?: Yes Does the patient have difficulty dressing or bathing?: No Independently performs ADLs?: Yes (appropriate for developmental age) Does the patient have difficulty walking or climbing stairs?: No Weakness of Legs: None Weakness of Arms/Hands: None  Emotional Assessment Attitude/Demeanor/Rapport: Engaged Affect (typically observed): Accepting Orientation: : Oriented to Self, Oriented to Place, Oriented to  Time, Oriented to Situation  Admission diagnosis:  Rectal cancer ([place]) [C20] Patient Active Problem List   Diagnosis Date Noted   Rectal cancer ([place]) 01/15/2022   Genetic testing 12/05/2021   Rectal cancer metastasized to liver [facility])    Generalized abdominal pain    Lower GI bleeding    Rectal bleeding 04/16/2021   Nicotine dependence 04/16/2021   Bipolar 1 disorder ([place]) 04/16/2021   Sciatica 04/16/2021   PCP:  Hayden Rasmussen, MD Pharmacy:   [place] [address] Phone: [phone] Fax: [phone]  [place] ([place]), [place] - [address] [place]) [place] [address] Phone: [phone] Fax: [place] [address] [place] [address] Phone: [phone] Fax: [place]  [place] [address] Phone:  [phone] Fax: [phone]  Social Determinants of Health (SDOH) Interventions    Readmission Risk Interventions     No data to display

## 2022-01-16 NOTE — Progress Notes (Signed)
1 Day Post-Op Robotic LAR with diverting ileostomy  Subjective: Doing well.  Pain controlled.  Some soreness at JP site.  No nausea.  Tolerating liquids  Objective: Vital signs in last 24 hours: Temp:  [97.6 F (36.4 C)-98.2 F (36.8 C)] 98.1 F (36.7 C) (12/15 1120) Pulse Rate:  [64-89] 89 (12/15 1120) Resp:  [12-19] 18 (12/15 1120) BP: (113-122)/(61-92) 117/63 (12/15 1120) SpO2:  [97 %-100 %] 98 % (12/15 1120) Weight:  [77.5 kg] 77.5 kg (12/15 0500)   Intake/Output from previous day: 12/14 0701 - 12/15 0700 In: 3436.2 [P.O.:650; I.V.:2586.2; IV Piggyback:200] Out: 2180 [Urine:1900; Drains:105; Stool:150; Blood:25] Intake/Output this shift: Total I/O In: -  Out: 455 [Urine:375; Drains:40; Stool:40]   General appearance: alert and cooperative GI: soft, non-distended  Incision: no significant drainage  Lab Results:  Recent Labs    01/16/22 0548  WBC 6.7  HGB 11.9*  HCT 36.1*  PLT 162   BMET Recent Labs    01/15/22 0536 01/16/22 0548  NA 137 138  K 3.6 4.0  CL 107 106  CO2 20* 24  GLUCOSE 100* 110*  BUN 8 5*  CREATININE 0.91 0.84  CALCIUM 8.8* 8.5*   PT/INR No results for input(s): "LABPROT", "INR" in the last 72 hours. ABG No results for input(s): "PHART", "HCO3" in the last 72 hours.  Invalid input(s): "PCO2", "PO2"  MEDS, Scheduled  acetaminophen  1,000 mg Oral Q6H   enoxaparin (LOVENOX) injection  40 mg Subcutaneous Q24H   feeding supplement  237 mL Oral BID BM   saccharomyces boulardii  250 mg Oral BID    Studies/Results: No results found.  Assessment: s/p Procedure(s): XI ROBOTIC ASSISTED LOWER ANTERIOR RESECTION DIVERTING ILEOSTOMY Patient Active Problem List   Diagnosis Date Noted   Rectal cancer ([place]) 01/15/2022   Genetic testing 12/05/2021   Rectal cancer metastasized to liver [facility])    Generalized abdominal pain    Lower GI bleeding    Rectal bleeding 04/16/2021   Nicotine dependence 04/16/2021   Bipolar 1 disorder ([place])  04/16/2021   Sciatica 04/16/2021    Expected post op course  Plan: d/c foley in AM Ambulate in hall [place] IVF's - monitor ostomy output and UOP closely.  Watch for dehydration.  May need to restart in ostomy outputs increase   LOS: 1 day     .Rosario Adie, [facility], [place]    01/16/2022 12:36 PM

## 2022-01-17 LAB — BASIC METABOLIC PANEL
Anion gap: 7 (ref 5–15)
BUN: 8 mg/dL (ref 6–20)
CO2: 28 mmol/L (ref 22–32)
Calcium: 9.2 mg/dL (ref 8.9–10.3)
Chloride: 103 mmol/L (ref 98–111)
Creatinine, Ser: 0.86 mg/dL (ref 0.61–1.24)
GFR, Estimated: >60 mL/min (ref >60)
Glucose, Bld: 96 mg/dL (ref 70–99)
Potassium: 3.9 mmol/L (ref 3.5–5.1)
Sodium: 138 mmol/L (ref 135–145)

## 2022-01-17 LAB — CBC
HCT: 37.8 % — ABNORMAL LOW (ref 39.0–52.0)
Hemoglobin: 12.6 g/dL — ABNORMAL LOW (ref 13.0–17.0)
MCH: 32.7 pg (ref 26.0–34.0)
MCHC: 33.3 g/dL (ref 30.0–36.0)
MCV: 98.2 fL (ref 80.0–100.0)
Platelets: 161 10*3/uL (ref 150–400)
RBC: 3.85 MIL/uL — ABNORMAL LOW (ref 4.22–5.81)
RDW: 12.2 % (ref 11.5–15.5)
WBC: 4.9 10*3/uL (ref 4.0–10.5)
nRBC: 0 % (ref 0.0–0.2)

## 2022-01-17 MED ORDER — GABAPENTIN 100 MG PO CAPS
300.0000 mg | ORAL_CAPSULE | Freq: Three times a day (TID) | ORAL | Status: DC
  Administered 2022-01-17 – 2022-01-19 (×7): 300 mg via ORAL
  Filled 2022-01-17 (×7): qty 3

## 2022-01-17 MED ORDER — CHLORHEXIDINE GLUCONATE CLOTH 2 % EX PADS
6.0000 | MEDICATED_PAD | Freq: Every day | CUTANEOUS | Status: DC
  Administered 2022-01-17: 6 via TOPICAL

## 2022-01-17 MED ORDER — OXYCODONE HCL 5 MG PO TABS
5.0000 mg | ORAL_TABLET | ORAL | Status: DC | PRN
  Administered 2022-01-17 – 2022-01-18 (×5): 5 mg via ORAL
  Filled 2022-01-17 (×5): qty 1

## 2022-01-17 NOTE — Progress Notes (Signed)
2 Days Post-Op   Subjective/Chief Complaint: Complains of pain. Tramadol does not seem to help   Objective: Vital signs in last 24 hours: Temp:  [97.8 F (36.6 C)-98.2 F (36.8 C)] 98.1 F (36.7 C) (12/16 0551) Pulse Rate:  [60-89] 60 (12/16 0551) Resp:  [16-18] 16 (12/16 0551) BP: (113-124)/(63-92) 124/70 (12/16 0551) SpO2:  [97 %-99 %] 97 % (12/16 0551) Weight:  [76.2 kg] 76.2 kg (12/16 0500) Last BM Date : 01/14/22  Intake/Output from previous day: 12/15 0701 - 12/16 0700 In: 840 [P.O.:840] Out: 3150 [Urine:2550; Drains:360; Stool:240] Intake/Output this shift: No intake/output data recorded.  General appearance: alert and cooperative Resp: clear to auscultation bilaterally Cardio: regular rate and rhythm GI: soft, appropriately tender. Ostomy pink and productive with bridge in place  Lab Results:  Recent Labs    01/16/22 0548 01/17/22 0600  WBC 6.7 4.9  HGB 11.9* 12.6*  HCT 36.1* 37.8*  PLT 162 161   BMET Recent Labs    01/16/22 0548 01/17/22 0600  NA 138 138  K 4.0 3.9  CL 106 103  CO2 24 28  GLUCOSE 110* 96  BUN 5* 8  CREATININE 0.84 0.86  CALCIUM 8.5* 9.2   PT/INR No results for input(s): "LABPROT", "INR" in the last 72 hours. ABG No results for input(s): "PHART", "HCO3" in the last 72 hours.  Invalid input(s): "PCO2", "PO2"  Studies/Results: No results found.  Anti-infectives: Anti-infectives (From admission, onward)    Start     Dose/Rate Route Frequency Ordered Stop   01/15/22 2000  cefoTEtan (CEFOTAN) 2 g in sodium chloride 0.9 % 100 mL IVPB        2 g 200 mL/hr over 30 Minutes Intravenous Every 12 hours 01/15/22 1259 01/15/22 2026   01/15/22 0600  cefoTEtan (CEFOTAN) 2 g in sodium chloride 0.9 % 100 mL IVPB        2 g 200 mL/hr over 30 Minutes Intravenous On call to O.R. 01/15/22 0535 01/15/22 0808       Assessment/Plan: s/p Procedure(s): XI ROBOTIC ASSISTED LOWER ANTERIOR RESECTION (N/A) DIVERTING ILEOSTOMY (N/A) Advance  diet D/c tramadol and start oxycodone Ambulate Ostomy teaching on Monday. Monitor ostomy output  LOS: 2 days    Jeremiah Allen 01/17/2022

## 2022-01-18 LAB — BASIC METABOLIC PANEL
Anion gap: 8 (ref 5–15)
BUN: 7 mg/dL (ref 6–20)
CO2: 27 mmol/L (ref 22–32)
Calcium: 9.1 mg/dL (ref 8.9–10.3)
Chloride: 101 mmol/L (ref 98–111)
Creatinine, Ser: 0.84 mg/dL (ref 0.61–1.24)
GFR, Estimated: >60 mL/min (ref >60)
Glucose, Bld: 113 mg/dL — ABNORMAL HIGH (ref 70–99)
Potassium: 3.8 mmol/L (ref 3.5–5.1)
Sodium: 136 mmol/L (ref 135–145)

## 2022-01-18 LAB — CBC
HCT: 41.7 % (ref 39.0–52.0)
Hemoglobin: 13.7 g/dL (ref 13.0–17.0)
MCH: 31.9 pg (ref 26.0–34.0)
MCHC: 32.9 g/dL (ref 30.0–36.0)
MCV: 97 fL (ref 80.0–100.0)
Platelets: 193 10*3/uL (ref 150–400)
RBC: 4.3 MIL/uL (ref 4.22–5.81)
RDW: 11.9 % (ref 11.5–15.5)
WBC: 3.9 10*3/uL — ABNORMAL LOW (ref 4.0–10.5)
nRBC: 0 % (ref 0.0–0.2)

## 2022-01-18 MED ORDER — OXYCODONE HCL 5 MG PO TABS
5.0000 mg | ORAL_TABLET | ORAL | Status: DC | PRN
  Administered 2022-01-18 – 2022-01-19 (×5): 10 mg via ORAL
  Filled 2022-01-18 (×5): qty 2

## 2022-01-18 MED ORDER — METHOCARBAMOL 500 MG PO TABS
500.0000 mg | ORAL_TABLET | Freq: Four times a day (QID) | ORAL | Status: DC | PRN
  Administered 2022-01-18 (×2): 500 mg via ORAL
  Filled 2022-01-18 (×2): qty 1

## 2022-01-18 NOTE — Progress Notes (Signed)
3 Days Post-Op   Subjective/Chief Complaint: Pain much better since addition of oxy.  Main pain is at the drain exit site.  No n/v since diet advance.     Objective: Vital signs in last 24 hours: Temp:  [97.4 F (36.3 C)-98.1 F (36.7 C)] 98.1 F (36.7 C) (12/17 0534) Pulse Rate:  [58-64] 58 (12/17 0534) Resp:  [16-18] 17 (12/17 0534) BP: (110-122)/(72-78) 113/78 (12/17 0534) SpO2:  [98 %] 98 % (12/17 0534) Last BM Date : 01/18/22  Intake/Output from previous day: 12/16 0701 - 12/17 0700 In: 460 [P.O.:460] Out: 1190 [Drains:250; Stool:940] Intake/Output this shift: No intake/output data recorded.   General appearance: alert and cooperative. Ambulating in the hall.  Resp: breathing comfortably GI: soft, appropriately tender. Ostomy pink. Red rubber catheter bridge in place.  Output relatively thin, but output <1L. Drain serosang.   Non distended.   Ext warm, well perfused.    Lab Results:  Recent Labs    01/17/22 0600 01/18/22 0525  WBC 4.9 3.9*  HGB 12.6* 13.7  HCT 37.8* 41.7  PLT 161 193   BMET Recent Labs    01/17/22 0600 01/18/22 0525  NA 138 136  K 3.9 3.8  CL 103 101  CO2 28 27  GLUCOSE 96 113*  BUN 8 7  CREATININE 0.86 0.84  CALCIUM 9.2 9.1   PT/INR No results for input(s): "LABPROT", "INR" in the last 72 hours. ABG No results for input(s): "PHART", "HCO3" in the last 72 hours.  Invalid input(s): "PCO2", "PO2"  Studies/Results: No results found.  Anti-infectives: Anti-infectives (From admission, onward)    Start     Dose/Rate Route Frequency Ordered Stop   01/15/22 2000  cefoTEtan (CEFOTAN) 2 g in sodium chloride 0.9 % 100 mL IVPB        2 g 200 mL/hr over 30 Minutes Intravenous Every 12 hours 01/15/22 1259 01/15/22 2026   01/15/22 0600  cefoTEtan (CEFOTAN) 2 g in sodium chloride 0.9 % 100 mL IVPB        2 g 200 mL/hr over 30 Minutes Intravenous On call to O.R. 01/15/22 0535 01/15/22 3845       Assessment/Plan: s/p  Procedure(s): XI ROBOTIC ASSISTED LOWER ANTERIOR RESECTION (N/A) DIVERTING ILEOSTOMY (N/A) POD 3  Diet as tolerated.   Prn oxy for pain.  Ambulate Ostomy teaching on Monday. Monitor ostomy output Possibly home tomorrow if ostomy output is reasonable and no other issues.    LOS: 3 days    Stark Klein 01/18/2022

## 2022-01-18 NOTE — Progress Notes (Signed)
Pt has been up in the hallway, ambulating, using front wheel walker x3, with fiance at side He has tolerated without difficulty.

## 2022-01-19 ENCOUNTER — Other Ambulatory Visit (HOSPITAL_COMMUNITY): Payer: Self-pay

## 2022-01-19 LAB — SURGICAL PATHOLOGY

## 2022-01-19 MED ORDER — LOPERAMIDE HCL 2 MG PO TABS
2.0000 mg | ORAL_TABLET | Freq: Four times a day (QID) | ORAL | 3 refills | Status: DC | PRN
  Filled 2022-01-19: qty 30, 8d supply, fill #0

## 2022-01-19 MED ORDER — OXYCODONE HCL 5 MG PO TABS
5.0000 mg | ORAL_TABLET | Freq: Four times a day (QID) | ORAL | 0 refills | Status: DC | PRN
  Filled 2022-01-19: qty 30, 4d supply, fill #0

## 2022-01-19 NOTE — Discharge Instructions (Signed)
ABDOMINAL SURGERY: POST OP INSTRUCTIONS  DIET: Follow a light bland diet the first 24 hours after arrival home, such as soup, liquids, crackers, etc.  Be sure to include lots of fluids daily.  Avoid fast food or heavy meals as your are more likely to get nauseated.  Do not eat any uncooked fruits or vegetables for the next 2 weeks as your colon heals. Take your usually prescribed home medications unless otherwise directed. PAIN CONTROL: Pain is best controlled by a usual combination of three different methods TOGETHER: Ice/Heat Over the counter pain medication Prescription pain medication Most patients will experience some swelling and bruising around the incisions.  Ice packs or heating pads (30-60 minutes up to 6 times a day) will help. Use ice for the first few days to help decrease swelling and bruising, then switch to heat to help relax tight/sore spots and speed recovery.  Some people prefer to use ice alone, heat alone, alternating between ice & heat.  Experiment to what works for you.  Swelling and bruising can take several weeks to resolve.   It is helpful to take an over-the-counter pain medication regularly for the first few weeks.  Choose one of the following that works best for you: Naproxen (Aleve, etc)  Two '220mg'$  tabs twice a day Ibuprofen (Advil, etc) Three '200mg'$  tabs four times a day (every meal & bedtime) Acetaminophen (Tylenol, etc) 500-'650mg'$  four times a day (every meal & bedtime) A  prescription for pain medication (such as oxycodone, hydrocodone, etc) should be given to you upon discharge.  Take your pain medication as prescribed.  If you are having problems/concerns with the prescription medicine (does not control pain, nausea, vomiting, rash, itching, etc), please call us [phone] to see if we need to switch you to a different pain medicine that will work better for you and/or control your side effect better. If you need a refill on your pain medication, please contact  your pharmacy.  They will contact our office to request authorization. Prescriptions will not be filled after 5 pm or on week-ends.  Watch your ostomy output closely.  25% of patients wih ileostomies will have to get readmitted for dehydration.  Your stool output should not be consistently in thin liquid form (should be a pudding consistency ideally) and should not exceed >1L in 24hrs.  You may adjust your imodium dose if needed to help with this, but if your urine ouptut becomes dark or you stop urinating you should call the office for assistance.  Make sure to drink plenty of fluids with electrolytes and avoid excessive caffeine.  If your ostomy output stops or you develop nausea and bloating, stop the imodium at the next dose and drink plenty of fluids to flush out your system.    Wash / shower every day.  You may shower over the incision / wound.  Avoid baths until the skin is fully healed.  Continue to shower over incision(s) after the dressing is off. ACTIVITIES as tolerated:   You may resume regular (light) daily activities beginning the next day--such as daily self-care, walking, climbing stairs--gradually increasing activities as tolerated.  If you can walk 30 minutes without difficulty, it is safe to try more intense activity such as jogging, treadmill, bicycling, low-impact aerobics, swimming, etc. Save the most intensive and strenuous activity for last such as sit-ups, heavy lifting, contact sports, etc  Refrain from any heavy lifting or straining until you are off narcotics for pain control.   DO NOT  PUSH THROUGH PAIN.  Let pain be your guide: If it hurts to do something, don't do it.  Pain is your body warning you to avoid that activity for another week until the pain goes down. You may drive when you are no longer taking prescription pain medication, you can comfortably wear a seatbelt, and you can safely maneuver your car and apply brakes. You may have sexual intercourse when it is  comfortable.  FOLLOW UP in our office Please call [facility] at [phone] to set up an appointment to see your surgeon in the office for a follow-up appointment approximately 1-2 weeks after your surgery. Make sure that you call for this appointment the day you arrive home to insure a convenient appointment time. 10. IF YOU HAVE DISABILITY OR FAMILY LEAVE FORMS, BRING THEM TO THE OFFICE FOR PROCESSING.  DO NOT GIVE THEM TO YOUR DOCTOR.   WHEN TO CALL us [phone]: Poor pain control Reactions / problems with new medications (rash/itching, nausea, etc)  Fever over 101.5 F (38.5 C) Inability to urinate Nausea and/or vomiting Worsening swelling or bruising Continued bleeding from incision. Increased pain, redness, or drainage from the incision  The clinic staff is available to answer your questions during regular business hours (8:30am-5pm).  Please don't hesitate to call and ask to speak to one of our nurses for clinical concerns.   A surgeon from [facility] is always on call at the hospitals   If you have a medical emergency, go to the nearest emergency room or call 911.    [facility], [place], [place], [place], [place]  [address] ? MAIN: [phone] ? TOLL FREE: [phone] ? FAX [phone] [email or web address]

## 2022-01-19 NOTE — TOC Transition Note (Signed)
Transition of Care [facility]) - CM/SW Discharge Note  Patient Details  Name: Jeremiah Jeremiah MRN: [id number] Date of Birth: 27-Jan-1986  Transition of [facility]) CM/SW Contact:  Sherie Don, LCSW Phone Number: 01/19/2022, 12:03 PM  Clinical Narrative: Patient will discharge home today with [facility] for new ostomy. CSW updated [facility] regarding discharge and discharge orders. TOC signing off.  Final next level of care: [place] Barriers to Discharge: Barriers Resolved  Patient Goals and CMS Choice Patient states their goals for this hospitalization and ongoing recovery are:: Return home CMS Medicare.gov Compare Post Acute Care list provided to:: Patient Choice offered to / list presented to : Patient  Discharge Plan and Services In-house Referral: Clinical Social Work Post Acute Care Choice: Home Health          DME Arranged: N/A DME Agency: NA HH Arranged: RN [place] Agency: [facility] Date HH Agency Contacted: 01/16/22 Time [place]: 1660 Representative spoke with at [place]: [place] Ar  Social Determinants of Health ([place]) Interventions Food Insecurity Interventions: Other (Comment)  Readmission Risk Interventions     No data to display

## 2022-01-19 NOTE — Progress Notes (Signed)
Discharge instructions given to patient and all questions were answered.  

## 2022-01-19 NOTE — Discharge Summary (Signed)
Physician Discharge Summary  Patient ID: Jeremiah Allen MRN: [id number] DOB/AGE: 1985/05/01 36 y.o.  Admit date: 01/15/2022 Discharge date: 01/19/2022  Admission Diagnoses: Rectal cancer  Discharge Diagnoses:  Principal Problem:   Rectal cancer [facility])   Discharged Condition: good  Hospital Course: Patient was admitted to the med surg floor after surgery.  Diet was advanced as tolerated.  Patient began to have bowel function on postop day 1.  By postop day 4, he was tolerating a solid diet and pain was controlled with oral medications.  He was urinating without difficulty and ambulating without assistance.  His ostomy output was < 1063m per day.  Patient was felt to be in stable condition for discharge to home.   Consults:  WOC  Significant Diagnostic Studies: labs: cbc, bmet  Treatments: IV hydration, analgesia: oxycodone, and surgery: robotic LAR  Discharge Exam: Blood pressure 117/80, pulse 68, temperature 97.8 F (36.6 C), temperature source Oral, resp. rate 18, height '5\' 9"'$  (1.753 m), weight 76.2 kg, SpO2 98 %. General appearance: alert and cooperative GI: soft, non-distended Incision/Wound: healing well without signs of infection Ostomy beefy red and productive  Disposition: Discharge disposition: 01-Home or Self Care        Allergies as of 01/19/2022       Reactions   Crab Extract Allergy Skin Test Anaphylaxis, Swelling   Throat swelling (CRAB ALLERGY ONLY)   Dilaudid [hydromorphone] Itching, Other (See Comments)   Skin tingling sensations         Medication List     STOP taking these medications    ARIPiprazole 5 MG tablet Commonly known as: ABILIFY   bisacodyl 5 MG EC tablet Commonly known as: bisacodyl   capecitabine 500 MG tablet Commonly known as: XELODA   docusate sodium 100 MG capsule Commonly known as: COLACE   lidocaine-prilocaine cream Commonly known as: EMLA   metroNIDAZOLE 500 MG tablet Commonly known as: FLAGYL    polyethylene glycol 17 g packet Commonly known as: MIRALAX / GLYCOLAX   polyethylene glycol powder 17 GM/SCOOP powder Commonly known as: GLYCOLAX/MIRALAX       TAKE these medications    acetaminophen 500 MG tablet Commonly known as: TYLENOL Take 500-1,000 mg by mouth every 6 (six) hours as needed for mild pain or moderate pain.   albuterol 108 (90 Base) MCG/ACT inhaler Commonly known as: VENTOLIN HFA Inhale 1-2 puffs into the lungs every 6 (six) hours as needed for wheezing or shortness of breath.   famotidine 20 MG tablet Commonly known as: PEPCID Take 20 mg by mouth 2 (two) times daily as needed for heartburn or indigestion.   gabapentin 600 MG tablet Commonly known as: NEURONTIN Take 1 tablet (600 mg total) by mouth 3 (three) times daily. What changed:  when to take this reasons to take this   hydrOXYzine 25 MG tablet Commonly known as: ATARAX Take 1 tablet (25 mg total) by mouth 3 (three) times daily as needed.   loperamide 2 MG tablet Commonly known as: Imodium A-D Take 1 tablet (2 mg total) by mouth 4 (four) times daily as needed (take for increased ostomy outputs).   methocarbamol 500 MG tablet Commonly known as: ROBAXIN Take 2 tablets (1,000 mg total) by mouth every 6 (six) hours as needed for muscle spasms.   ondansetron 8 MG tablet Commonly known as: ZOFRAN Take 1 tablet (8 mg total) by mouth every 8 (eight) hours as needed for nausea or vomiting (May start using after day 3 for nausea and vomiting).  oxyCODONE 5 MG immediate release tablet Commonly known as: Oxy IR/ROXICODONE Take 1-2 tablets (5-10 mg total) by mouth every 6 (six) hours as needed for moderate pain.   prochlorperazine 10 MG tablet Commonly known as: COMPAZINE Take 1 tablet (10 mg total) by mouth every 6 (six) hours as needed for nausea or vomiting.        Follow-up Information     Leighton Ruff, MD. Schedule an appointment as soon as possible for a visit in 2 week(s).    Specialties: General Surgery, Colon and Rectal Surgery Contact information: [place] [address] [phone]                 Signed: Rosario Adie 59/13/6859, 8:47 AM

## 2022-01-19 NOTE — Consult Note (Signed)
[place] Nurse ostomy follow up Stoma type/location: RLQ, loop ileostomy Stomal assessment/size: moist, pink, budded  Peristomal assessment: intact  Treatment options for stomal/peristomal skin: 2"ostomy barrier ring Output liquid yellow/brown Ostomy pouching: 2pc. 21/4" with 2" skin barrier ring Education provided:  Allowed patient to perform pouch change (cutting new skin barrier, cleaning peristomal skin and stoma, use of barrier ring) WOC nurse removed red rubber support bridge after verifying MD order Patient independent with emptying and cleaning spout  Discussed bathing, diet, gas, medication use, constipation, diarrhea, dehydration  Discussed food blockage   Enrolled patient in [place] Start Discharge program: Yes  [place] Nurse will follow along with you for continued support with ostomy teaching and care [place] MSN, RN, [place], [place], [place]

## 2022-01-20 ENCOUNTER — Other Ambulatory Visit: Payer: Self-pay | Admitting: Oncology

## 2022-01-20 ENCOUNTER — Other Ambulatory Visit (HOSPITAL_BASED_OUTPATIENT_CLINIC_OR_DEPARTMENT_OTHER): Payer: Self-pay

## 2022-01-20 DIAGNOSIS — Z48815 Encounter for surgical aftercare following surgery on the digestive system: Secondary | ICD-10-CM | POA: Diagnosis not present

## 2022-01-20 DIAGNOSIS — Z433 Encounter for attention to colostomy: Secondary | ICD-10-CM | POA: Diagnosis not present

## 2022-01-20 DIAGNOSIS — F411 Generalized anxiety disorder: Secondary | ICD-10-CM | POA: Diagnosis not present

## 2022-01-20 DIAGNOSIS — C20 Malignant neoplasm of rectum: Secondary | ICD-10-CM | POA: Diagnosis not present

## 2022-01-20 MED ORDER — METHOCARBAMOL 500 MG PO TABS
ORAL_TABLET | ORAL | 0 refills | Status: DC
  Filled 2022-01-20: qty 30, 10d supply, fill #0

## 2022-01-20 NOTE — Telephone Encounter (Signed)
Needs to come from surgeon

## 2022-01-21 ENCOUNTER — Other Ambulatory Visit: Payer: Self-pay | Admitting: Oncology

## 2022-01-21 ENCOUNTER — Other Ambulatory Visit (HOSPITAL_BASED_OUTPATIENT_CLINIC_OR_DEPARTMENT_OTHER): Payer: Self-pay

## 2022-01-21 ENCOUNTER — Encounter: Payer: Self-pay | Admitting: General Surgery

## 2022-01-21 NOTE — Telephone Encounter (Signed)
Will discuss w/md

## 2022-01-22 ENCOUNTER — Other Ambulatory Visit: Payer: Self-pay | Admitting: Oncology

## 2022-01-22 ENCOUNTER — Other Ambulatory Visit: Payer: Self-pay

## 2022-01-22 ENCOUNTER — Telehealth: Payer: Self-pay | Admitting: *Deleted

## 2022-01-22 ENCOUNTER — Other Ambulatory Visit (HOSPITAL_BASED_OUTPATIENT_CLINIC_OR_DEPARTMENT_OTHER): Payer: Self-pay

## 2022-01-22 DIAGNOSIS — Z48815 Encounter for surgical aftercare following surgery on the digestive system: Secondary | ICD-10-CM | POA: Diagnosis not present

## 2022-01-22 DIAGNOSIS — C20 Malignant neoplasm of rectum: Secondary | ICD-10-CM | POA: Diagnosis not present

## 2022-01-22 DIAGNOSIS — F411 Generalized anxiety disorder: Secondary | ICD-10-CM | POA: Diagnosis not present

## 2022-01-22 DIAGNOSIS — Z433 Encounter for attention to colostomy: Secondary | ICD-10-CM | POA: Diagnosis not present

## 2022-01-22 NOTE — Telephone Encounter (Signed)
Jeremiah Allen requested refill on gabapentin 600 mg tid, saying he takes it for his rectal discomfort at least bid. Dr. Benay Spice prefers Dr. Marcello Moores to determine if this refill needs to continue and defers to her. Pharmacy notified and patient as well.

## 2022-01-23 DIAGNOSIS — Z432 Encounter for attention to ileostomy: Secondary | ICD-10-CM | POA: Diagnosis not present

## 2022-01-23 DIAGNOSIS — N3289 Other specified disorders of bladder: Secondary | ICD-10-CM | POA: Diagnosis not present

## 2022-01-23 DIAGNOSIS — Z433 Encounter for attention to colostomy: Secondary | ICD-10-CM | POA: Diagnosis not present

## 2022-01-23 DIAGNOSIS — Z85048 Personal history of other malignant neoplasm of rectum, rectosigmoid junction, and anus: Secondary | ICD-10-CM | POA: Diagnosis not present

## 2022-01-23 DIAGNOSIS — R609 Edema, unspecified: Secondary | ICD-10-CM | POA: Diagnosis not present

## 2022-01-24 ENCOUNTER — Emergency Department (HOSPITAL_COMMUNITY): Payer: Medicaid Other

## 2022-01-24 ENCOUNTER — Emergency Department (HOSPITAL_COMMUNITY)
Admission: EM | Admit: 2022-01-24 | Discharge: 2022-01-24 | Disposition: A | Discharge status: Home / Self Care | Payer: Medicaid Other | Attending: Emergency Medicine | Admitting: Emergency Medicine

## 2022-01-24 ENCOUNTER — Other Ambulatory Visit: Payer: Self-pay

## 2022-01-24 DIAGNOSIS — Z432 Encounter for attention to ileostomy: Secondary | ICD-10-CM

## 2022-01-24 DIAGNOSIS — N3289 Other specified disorders of bladder: Secondary | ICD-10-CM | POA: Diagnosis not present

## 2022-01-24 LAB — CBC WITH DIFFERENTIAL/PLATELET
Abs Immature Granulocytes: 0.01 10*3/uL (ref 0.00–0.07)
Basophils Absolute: 0 10*3/uL (ref 0.0–0.1)
Basophils Relative: 1 %
Eosinophils Absolute: 0.1 10*3/uL (ref 0.0–0.5)
Eosinophils Relative: 1 %
HCT: 36.3 % — ABNORMAL LOW (ref 39.0–52.0)
Hemoglobin: 12.2 g/dL — ABNORMAL LOW (ref 13.0–17.0)
Immature Granulocytes: 0 %
Lymphocytes Relative: 23 %
Lymphs Abs: 0.9 10*3/uL (ref 0.7–4.0)
MCH: 32.4 pg (ref 26.0–34.0)
MCHC: 33.6 g/dL (ref 30.0–36.0)
MCV: 96.3 fL (ref 80.0–100.0)
Monocytes Absolute: 0.4 10*3/uL (ref 0.1–1.0)
Monocytes Relative: 10 %
Neutro Abs: 2.5 10*3/uL (ref 1.7–7.7)
Neutrophils Relative %: 65 %
Platelets: 207 10*3/uL (ref 150–400)
RBC: 3.77 MIL/uL — ABNORMAL LOW (ref 4.22–5.81)
RDW: 11.8 % (ref 11.5–15.5)
WBC: 3.9 10*3/uL — ABNORMAL LOW (ref 4.0–10.5)
nRBC: 0 % (ref 0.0–0.2)

## 2022-01-24 LAB — URINALYSIS, ROUTINE W REFLEX MICROSCOPIC
Bilirubin Urine: NEGATIVE
Glucose, UA: NEGATIVE mg/dL
Hgb urine dipstick: NEGATIVE
Ketones, ur: NEGATIVE mg/dL
Leukocytes,Ua: NEGATIVE
Nitrite: NEGATIVE
Protein, ur: NEGATIVE mg/dL
Specific Gravity, Urine: 1.011 (ref 1.005–1.030)
pH: 5 (ref 5.0–8.0)

## 2022-01-24 LAB — COMPREHENSIVE METABOLIC PANEL
ALT: 21 U/L (ref 0–44)
AST: 18 U/L (ref 15–41)
Albumin: 3.8 g/dL (ref 3.5–5.0)
Alkaline Phosphatase: 59 U/L (ref 38–126)
Anion gap: 6 (ref 5–15)
BUN: 15 mg/dL (ref 6–20)
CO2: 25 mmol/L (ref 22–32)
Calcium: 8.7 mg/dL — ABNORMAL LOW (ref 8.9–10.3)
Chloride: 106 mmol/L (ref 98–111)
Creatinine, Ser: 0.76 mg/dL (ref 0.61–1.24)
GFR, Estimated: >60 mL/min (ref >60)
Glucose, Bld: 97 mg/dL (ref 70–99)
Potassium: 3.8 mmol/L (ref 3.5–5.1)
Sodium: 137 mmol/L (ref 135–145)
Total Bilirubin: 0.4 mg/dL (ref 0.3–1.2)
Total Protein: 7.2 g/dL (ref 6.5–8.1)

## 2022-01-24 LAB — LIPASE, BLOOD: Lipase: 30 U/L (ref 11–51)

## 2022-01-24 MED ORDER — IOHEXOL 300 MG/ML  SOLN
100.0000 mL | Freq: Once | INTRAMUSCULAR | Status: AC | PRN
  Administered 2022-01-24: 100 mL via INTRAVENOUS

## 2022-01-24 MED ORDER — HEPARIN SOD (PORK) LOCK FLUSH 100 UNIT/ML IV SOLN
500.0000 [IU] | Freq: Once | INTRAVENOUS | Status: AC
  Administered 2022-01-24: 500 [IU]
  Filled 2022-01-24: qty 5

## 2022-01-24 MED ORDER — SODIUM CHLORIDE (PF) 0.9 % IJ SOLN
INTRAMUSCULAR | Status: AC
  Filled 2022-01-24: qty 50

## 2022-01-24 MED ORDER — MORPHINE SULFATE (PF) 4 MG/ML IV SOLN
4.0000 mg | Freq: Once | INTRAVENOUS | Status: AC
  Administered 2022-01-24: 4 mg via INTRAVENOUS
  Filled 2022-01-24: qty 1

## 2022-01-24 NOTE — ED Triage Notes (Signed)
Patient BIB EMS for evaluation of post op complications. Had robotic assisted lower anterior resection diverting ileostomy for rectal cancer on 12/14.  Reports last night he noticed redness and warmth around stoma.  States that the R corner "will not seal."  No reports of fever.

## 2022-01-24 NOTE — Discharge Instructions (Addendum)
CT scan looked great today. Use the new bags if they seem to be working better for you. I have left message with our transition of care team to follow-up and try to assist with getting the new bags ordered for you. Follow-up with Dr. Marcello Moores as scheduled. Return here for any new/acute changes-- fever, vomiting, severe pain, etc.

## 2022-01-24 NOTE — ED Provider Notes (Signed)
[facility] DEPT Provider Note   CSN: [id number] Arrival date & time: 01/23/22  2349     History  Chief Complaint  Patient presents with   Post-op Problem    Jeremiah Allen is a 36 y.o. male.  The history is provided by the patient and medical records.   36 year old male with newly diagnosed advanced rectal cancer with metastatic disease, postop from diverting ileostomy on 01/15/2022 with Dr. Marcello Moores, presenting to the ED with issues from his ostomy.  Reports his surgery went well without any noted complications.  He has been doing well over the past few days.  States today when trying to change his ostomy bags he notices the right lower portion of the bag will not seal correctly over the ostomy.  States he has been repeatedly changing the bags because it will not seal, he believes this is because his ostomy to open up more as stoma appears much larger than it initially did.  He denies any blood in the stool.  He has not had any fever.  No vomiting.  Home Medications Prior to Admission medications   Medication Sig Start Date End Date Taking? Authorizing Provider  acetaminophen (TYLENOL) 500 MG tablet Take 500-1,000 mg by mouth every 6 (six) hours as needed for mild pain or moderate pain.   Yes [provider]  albuterol (VENTOLIN HFA) 108 (90 Base) MCG/ACT inhaler Inhale 1-2 puffs into the lungs every 6 (six) hours as needed for wheezing or shortness of breath.   Yes [provider]  famotidine (PEPCID) 20 MG tablet Take 20 mg by mouth 2 (two) times daily as needed for heartburn or indigestion.   Yes [provider]  gabapentin (NEURONTIN) 600 MG tablet Take 1 tablet (600 mg total) by mouth 3 (three) times daily. Patient taking differently: Take 600 mg by mouth 3 (three) times daily as needed (pain). 08/21/21  Yes Ladell Pier, MD  hydrOXYzine (ATARAX) 25 MG tablet Take 1 tablet (25 mg total) by mouth 3 (three) times daily as  needed. Patient taking differently: Take 25 mg by mouth 3 (three) times daily. 10/31/21  Yes Owens Shark, NP  methocarbamol (ROBAXIN) 500 MG tablet Take 1 tablet (500 mg total) by mouth 3 (three) times daily as needed for up to 10 days 01/20/22  Yes   ondansetron (ZOFRAN) 8 MG tablet Take 1 tablet (8 mg total) by mouth every 8 (eight) hours as needed for nausea or vomiting (May start using after day 3 for nausea and vomiting). 09/10/21  Yes Ladell Pier, MD  oxyCODONE (OXY IR/ROXICODONE) 5 MG immediate release tablet Take 1 - 2 tablets (5 - 10 mg total) by mouth every 6 (six) hours as needed for moderate pain. 08/67/61  Yes Leighton Ruff, MD  prochlorperazine (COMPAZINE) 10 MG tablet Take 1 tablet (10 mg total) by mouth every 6 (six) hours as needed for nausea or vomiting. 04/25/21  Yes Ladell Pier, MD  loperamide (IMODIUM A-D) 2 MG tablet Take 1 tablet (2 mg total) by mouth 4 (four) times daily as needed (take for increased ostomy outputs). 95/09/32   Leighton Ruff, MD  methocarbamol (ROBAXIN) 500 MG tablet Take 2 tablets (1,000 mg total) by mouth every 6 (six) hours as needed for muscle spasms. Patient not taking: Reported on 01/24/2022 08/21/21   Ladell Pier, MD      Allergies    Crab extract allergy skin test and Dilaudid [hydromorphone]    Review of Systems  Review of Systems  Gastrointestinal:  Positive for abdominal pain.  All other systems reviewed and are negative.   Physical Exam Updated Vital Signs BP (!) 111/59   Pulse 60   Temp 97.8 F (36.6 C) (Oral)   Resp 16   Ht '5\' 9"'$  (1.753 m)   Wt 70.3 kg   SpO2 98%   BMI 22.89 kg/m   Physical Exam Vitals and nursing note reviewed.  Constitutional:      Appearance: He is well-developed.  HENT:     Head: Normocephalic and atraumatic.  Eyes:     Conjunctiva/sclera: Conjunctivae normal.     Pupils: Pupils are equal, round, and reactive to light.  Cardiovascular:     Rate and Rhythm: Normal rate and regular  rhythm.     Heart sounds: Normal heart sounds.  Pulmonary:     Effort: Pulmonary effort is normal. No respiratory distress.     Breath sounds: Normal breath sounds. No rhonchi.  Abdominal:     General: Bowel sounds are normal.     Palpations: Abdomen is soft.     Comments: Ostomy right abdomen, stoma appears quite large, stool excreted that is non-bloody, no bleeding from around stoma site  Musculoskeletal:        General: Normal range of motion.     Cervical back: Normal range of motion.  Skin:    General: Skin is warm and dry.  Neurological:     Mental Status: He is alert and oriented to person, place, and time.     ED Results / Procedures / Treatments   Labs (all labs ordered are listed, but only abnormal results are displayed) Labs Reviewed  CBC WITH DIFFERENTIAL/PLATELET - Abnormal; Notable for the following components:      Result Value   WBC 3.9 (*)    RBC 3.77 (*)    Hemoglobin 12.2 (*)    HCT 36.3 (*)    All other components within normal limits  COMPREHENSIVE METABOLIC PANEL - Abnormal; Notable for the following components:   Calcium 8.7 (*)    All other components within normal limits  URINALYSIS, ROUTINE W REFLEX MICROSCOPIC - Abnormal; Notable for the following components:   Color, Urine STRAW (*)    All other components within normal limits  LIPASE, BLOOD    EKG None  Radiology CT ABDOMEN PELVIS W CONTRAST  Result Date: 01/24/2022 CLINICAL DATA:  Abdominal pain. Status post colostomy. History of rectal cancer. EXAM: CT ABDOMEN AND PELVIS WITH CONTRAST TECHNIQUE: Multidetector CT imaging of the abdomen and pelvis was performed using the standard protocol following bolus administration of intravenous contrast. RADIATION DOSE REDUCTION: This exam was performed according to the departmental dose-optimization program which includes automated exposure control, adjustment of the mA and/or kV according to patient size and/or use of iterative reconstruction  technique. CONTRAST:  14m OMNIPAQUE IOHEXOL 300 MG/ML  SOLN COMPARISON:  01/05/2022 FINDINGS: Lower chest: No acute abnormality. Hepatobiliary: There are signs of previous hepatic wedge resections involving the anterior and posterior dome and inferior right lobe. No new liver lesions. Gallbladder appears normal. No bile duct dilatation. Pancreas: Unremarkable. No pancreatic ductal dilatation or surrounding inflammatory changes. Spleen: Normal in size without focal abnormality. Adrenals/Urinary Tract: Normal adrenal glands. No nephrolithiasis, hydronephrosis or kidney mass. Similar appearance of mild circumferential wall thickening of the bladder. Stomach/Bowel: Stomach appears normal. Right lower quadrant ileostomy identified. Postsurgical changes from low anterior resection. There is no bowel wall thickening, inflammation, or distension. The appendix is visualized and appears  normal. Vascular/Lymphatic: Patent upper abdominal vascularity. Normal appearance of the abdominal aorta. No abdominopelvic adenopathy. Reproductive: Prostate is unremarkable. Other: There is a small amount of free fluid within the posterior pelvis, image 68/2. Likely postoperative. No discrete fluid collections identified to suggest abscess. No parastomal or abdominal wall fluid collections identified. Musculoskeletal: No acute or significant osseous findings. IMPRESSION: 1. No acute findings identified within the abdomen or pelvis. 2. Status post low anterior resection with right lower quadrant ileostomy. 3. Small amount of free fluid within the posterior pelvis is likely postoperative. No discrete fluid collections identified to suggest abscess. 4. Similar appearance of mild circumferential wall thickening of the bladder. Correlate for any clinical signs or symptoms of cystitis. Electronically Signed   By: Kerby Moors M.D.   On: 01/24/2022 05:17    Procedures Procedures    Medications Ordered in ED Medications  morphine (PF) 4  MG/ML injection 4 mg (4 mg Intravenous Given 01/24/22 0205)  iohexol (OMNIPAQUE) 300 MG/ML solution 100 mL (100 mLs Intravenous Contrast Given 01/24/22 0418)  sodium chloride (PF) 0.9 % injection (  Given by Other 01/24/22 0441)    ED Course/ Medical Decision Making/ A&P                           Medical Decision Making Amount and/or Complexity of Data Reviewed Labs: ordered. Radiology: ordered and independent interpretation performed.  Risk Prescription drug management.   36 y.o. M here with issues with his ileostomy.  Recently diagnosed rectal cancer, just had diverting ileostomy placed 01/16/2022 by Dr. Marcello Moores.  Has been having issues with his bags and feels like his stoma is larger than normal amount.  He has not had any vomiting or fever.  Stoma is large but there is no active bleeding, excreting normal fecal contents.  Stoma itself appears clean without signs of infection.  Given his recent surgery, labs and CT obtained.  Labs without leukocytosis or electrolyte derangement.  CT without any acute findings.  After talking with patient and his wife, they were discharged home with different ostomy bags than they were used to in the hospital.  [place] bags here from the ER seemed to adhere to the skin better, no leaking.  They were given a few bags to go home with as well as cleaning supplies and barrier cream.  I have left message with [facility] care team to see if we can get his order changed back to the original bags he was using.  He has scheduled follow-up with Dr. Marcello Moores next week.  Can return here for any new/acute changes.  Final Clinical Impression(s) / ED Diagnoses Final diagnoses:  Ileostomy care [facility])    Rx / [place] Orders ED Discharge Orders     None         Larene Pickett, PA-C 01/24/22 6294    Maudie Flakes, MD 01/24/22 [phone]

## 2022-01-24 NOTE — ED Notes (Signed)
Pt's family member to nurses station asking when pt is going to be seen. Advised that pt has been seen by a provider and orders placed. Family member states pt is in a lot of pain. Provider notified.

## 2022-01-27 ENCOUNTER — Inpatient Hospital Stay: Payer: Medicaid Other | Admitting: Licensed Clinical Social Worker

## 2022-01-27 ENCOUNTER — Inpatient Hospital Stay (HOSPITAL_BASED_OUTPATIENT_CLINIC_OR_DEPARTMENT_OTHER): Payer: Medicaid Other | Admitting: Oncology

## 2022-01-27 ENCOUNTER — Telehealth: Payer: Self-pay | Admitting: Oncology

## 2022-01-27 ENCOUNTER — Inpatient Hospital Stay: Payer: Medicaid Other | Attending: Oncology | Admitting: Oncology

## 2022-01-27 ENCOUNTER — Other Ambulatory Visit: Payer: Self-pay | Admitting: *Deleted

## 2022-01-27 ENCOUNTER — Encounter: Payer: Self-pay | Admitting: Oncology

## 2022-01-27 VITALS — BP 106/60 | HR 63 | Temp 98.2°F | Resp 20 | Ht 69.0 in | Wt 164.0 lb

## 2022-01-27 DIAGNOSIS — R252 Cramp and spasm: Secondary | ICD-10-CM | POA: Insufficient documentation

## 2022-01-27 DIAGNOSIS — Z933 Colostomy status: Secondary | ICD-10-CM | POA: Diagnosis not present

## 2022-01-27 DIAGNOSIS — C20 Malignant neoplasm of rectum: Secondary | ICD-10-CM

## 2022-01-27 DIAGNOSIS — F319 Bipolar disorder, unspecified: Secondary | ICD-10-CM | POA: Diagnosis not present

## 2022-01-27 DIAGNOSIS — C787 Secondary malignant neoplasm of liver and intrahepatic bile duct: Secondary | ICD-10-CM | POA: Insufficient documentation

## 2022-01-27 NOTE — Progress Notes (Signed)
PATIENT NAVIGATOR PROGRESS NOTE  Name: Jeremiah Allen Date: 01/27/2022 MRN: [id number]  DOB: 1985/05/28   Reason for visit:  F/U visit with Dr Benay Spice   Comments:  Patient in with significant other and discussed ostomy care Issues with leaking addressed and discussed skin prep pads and barrier ring selection. Issues with intimacy discussed such as clothing selection and ostomy care. Referral made to Ostomy clinic.  Encouraged to call with any issues or questions    Time spent counseling/coordinating care: > 60 minutes

## 2022-01-27 NOTE — Telephone Encounter (Signed)
Called patient to notify of upcoming appointments. Left voicemail with new appointment information . 

## 2022-01-27 NOTE — Progress Notes (Signed)
[facility] OFFICE PROGRESS NOTE   Diagnosis: Rectal cancer  INTERVAL HISTORY:   Jeremiah Allen underwent a robotic assisted low anterior resection and diverting ileostomy on 01/15/2022.  A mass was noted at 7 cm from the anal verge.  No evidence of metastatic disease.  The anastomosis is at 4 cm from the anal verge by rigid proctoscopy.  He reports the ileostomy is functioning well.  The stool is semiformed.  He empties the ileostomy 4-5 times per day.  He is taking adequate fluids. He complains of muscle cramps at the right greater than left upper back.  No dyspnea.   Objective:  Vital signs in last 24 hours:  Blood pressure 106/60, pulse 63, temperature 98.2 F (36.8 C), temperature source Oral, resp. rate 20, height 5' 9" (1.753 m), weight 164 lb (74.4 kg), SpO2 98 %.    HEENT: The mucous membranes are moist Resp: Lungs clear bilaterally, no respiratory distress Cardio: Regular rate and rhythm GI: No hepatosplenomegaly, healed surgical incisions, inflamed trocar site in the right abdomen without fluctuance or drainage, right abdomen ileostomy Vascular: No leg edema    Portacath/PICC-without erythema  Lab Results:  Lab Results  Component Value Date   WBC 3.9 (L) 01/24/2022   HGB 12.2 (L) 01/24/2022   HCT 36.3 (L) 01/24/2022   MCV 96.3 01/24/2022   PLT 207 01/24/2022   NEUTROABS 2.5 01/24/2022    CMP  Lab Results  Component Value Date   NA 137 01/24/2022   K 3.8 01/24/2022   CL 106 01/24/2022   CO2 25 01/24/2022   GLUCOSE 97 01/24/2022   BUN 15 01/24/2022   CREATININE 0.76 01/24/2022   CALCIUM 8.7 (L) 01/24/2022   PROT 7.2 01/24/2022   ALBUMIN 3.8 01/24/2022   AST 18 01/24/2022   ALT 21 01/24/2022   ALKPHOS 59 01/24/2022   BILITOT 0.4 01/24/2022   GFRNONAA >60 01/24/2022    Lab Results  Component Value Date   CEA1 14.5 (H) 04/17/2021   CEA 2.41 12/05/2021    Lab Results  Component Value Date   INR 1.1 04/16/2021   LABPROT 14.0  04/16/2021    Imaging:  CT ABDOMEN PELVIS W CONTRAST  Result Date: 01/24/2022 CLINICAL DATA:  Abdominal pain. Status post colostomy. History of rectal cancer. EXAM: CT ABDOMEN AND PELVIS WITH CONTRAST TECHNIQUE: Multidetector CT imaging of the abdomen and pelvis was performed using the standard protocol following bolus administration of intravenous contrast. RADIATION DOSE REDUCTION: This exam was performed according to the departmental dose-optimization program which includes automated exposure control, adjustment of the mA and/or kV according to patient size and/or use of iterative reconstruction technique. CONTRAST:  137m OMNIPAQUE IOHEXOL 300 MG/ML  SOLN COMPARISON:  01/05/2022 FINDINGS: Lower chest: No acute abnormality. Hepatobiliary: There are signs of previous hepatic wedge resections involving the anterior and posterior dome and inferior right lobe. No new liver lesions. Gallbladder appears normal. No bile duct dilatation. Pancreas: Unremarkable. No pancreatic ductal dilatation or surrounding inflammatory changes. Spleen: Normal in size without focal abnormality. Adrenals/Urinary Tract: Normal adrenal glands. No nephrolithiasis, hydronephrosis or kidney mass. Similar appearance of mild circumferential wall thickening of the bladder. Stomach/Bowel: Stomach appears normal. Right lower quadrant ileostomy identified. Postsurgical changes from low anterior resection. There is no bowel wall thickening, inflammation, or distension. The appendix is visualized and appears normal. Vascular/Lymphatic: Patent upper abdominal vascularity. Normal appearance of the abdominal aorta. No abdominopelvic adenopathy. Reproductive: Prostate is unremarkable. Other: There is a small amount of free fluid within  the posterior pelvis, image 68/2. Likely postoperative. No discrete fluid collections identified to suggest abscess. No parastomal or abdominal wall fluid collections identified. Musculoskeletal: No acute or  significant osseous findings. IMPRESSION: 1. No acute findings identified within the abdomen or pelvis. 2. Status post low anterior resection with right lower quadrant ileostomy. 3. Small amount of free fluid within the posterior pelvis is likely postoperative. No discrete fluid collections identified to suggest abscess. 4. Similar appearance of mild circumferential wall thickening of the bladder. Correlate for any clinical signs or symptoms of cystitis. Electronically Signed   By: Kerby Moors M.D.   On: 01/24/2022 05:17    Medications: I have reviewed the patient's current medications.   Assessment/Plan:  Rectal cancer-clinical stage IV(T4aN2, M1) Sigmoidoscopy 04/16/2021-mass at 4 cm from the anal verge, invasive moderately differentiated adenocarcinoma with lymphovascular invasion OmniSeq-MSS, tumor mutation burden 3.9, PD-L1 less than 1%, K-ras G12 D CT angio abdomen/pelvis-bulky irregular rectal wall thickening with perirectal lymphadenopathy and 2 indeterminate liver lesions MRI liver 04/17/2021-3 discrete right liver metastases MRI pelvis 04/17/2021-T4aN2 rectal tumor-10 cm from the anal verge, involvement of left anterior peritoneal reflection, numerous mesorectal lymph nodes within the pelvis, no evidence of extra mesorectal adenopathy greater than 10 nodes in the mesorectum CT chest 04/18/2021-no evidence of metastatic disease Elevated CEA (14.5 on 04/17/2021) Cycle 1 FOLFOX 04/30/2021 Cycle 2 FOLFOX 05/14/2021 Cycle 3 FOLFOX 05/28/2021, Udenyca Cycle 4 FOLFOX 06/11/2021, prophylactic dexamethasone added for delayed nausea, Udenyca Cycle 5 FOLFOX 06/25/2021, Udenyca Cycle 6 FOLFOX 07/08/2021, Udenyca CTs 07/17/2021-decrease in size of hepatic lesions, decreased rectal wall thickening, decrease size of perirectal lymph nodes, lingular consolidation consistent with infectious/inflammatory process 08/04/2021-right liver metastectomy procedures, segment 6, 7, 8 lesions removed.  The pathology from  the segment 6 and segment 8 lesions revealed no residual carcinoma, the segment 7 lesion revealed pools of mucin and associated calcifications with rare atypical cells-partial to complete response, extensive thermal/mechanical artifact with focal ink on mucin, margin interpreted as negative, though a positive margin could not be excluded Cycle 7 FOLFOX 08/27/2021 Cycle 8 FOLFOX 09/10/2021 Radiation/Xeloda 09/30/2021-11/07/2021 CTs 01/05/2022-multiple areas of hepatic wedge resection, no new or progressive disease, unchanged mid to high rectal thickening Robotic low anterior resection/ileostomy 05/16/2021-no residual carcinoma consistent with complete response, fibrosis consistent with treatment effect, negative margins, 0/15 nodes Rectal bleeding and pain secondary to #1 yT0, ypN0 Chronic left sciatica Bipolar disorder Right testicular fullness noted on exam 04/21/2021-epididymal cyst? 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  The pathology from the low anterior resection is consistent with a complete clinical response from the total neoadjuvant therapy.  I discussed the pathology findings with Jeremiah Allen.  He will continue follow-up with Dr. Marcello Moores for management of the ileostomy and to decide on the timing of ileostomy takedown.Marland Kitchen  He will return for an office visit and CEA in 6 weeks.  The Port-A-Cath remains in place.  I encouraged him to continue increased fluid intake.  He will use Imodium if the liquid stool output increases.  He will see Dr. Marcello Moores next week to evaluate the inflamed surgical site.    Betsy Coder, MD  01/27/2022  11:56 AM

## 2022-01-27 NOTE — Progress Notes (Signed)
[facility] CSW Progress Note  Holiday representative met with patient to assess current needs.  Patient's significant other, Jeremiah Allen, was also present.  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  Patients is currently receiving disability.  They both report financial stress.  Provided a bag of food.  Will also explore grants and follow up.  Patient and Jeremiah Allen agreed.  They had no other questions at this time.    [name], LCSW    Patient is participating in a Managed Medicaid Plan:  Yes

## 2022-01-28 ENCOUNTER — Other Ambulatory Visit: Payer: Self-pay | Admitting: General Surgery

## 2022-01-28 ENCOUNTER — Other Ambulatory Visit: Payer: Self-pay

## 2022-01-28 ENCOUNTER — Other Ambulatory Visit (HOSPITAL_BASED_OUTPATIENT_CLINIC_OR_DEPARTMENT_OTHER): Payer: Self-pay

## 2022-01-28 NOTE — Progress Notes (Signed)
The proposed treatment discussed in conference is for discussion purpose only and is not a binding recommendation.  The patients have not been physically examined, or presented with their treatment options.  Therefore, final treatment plans cannot be decided.  

## 2022-01-29 ENCOUNTER — Other Ambulatory Visit: Payer: Self-pay | Admitting: General Surgery

## 2022-01-29 ENCOUNTER — Other Ambulatory Visit (HOSPITAL_BASED_OUTPATIENT_CLINIC_OR_DEPARTMENT_OTHER): Payer: Self-pay

## 2022-01-29 DIAGNOSIS — C20 Malignant neoplasm of rectum: Secondary | ICD-10-CM | POA: Diagnosis not present

## 2022-01-29 DIAGNOSIS — Z433 Encounter for attention to colostomy: Secondary | ICD-10-CM | POA: Diagnosis not present

## 2022-01-29 DIAGNOSIS — F411 Generalized anxiety disorder: Secondary | ICD-10-CM | POA: Diagnosis not present

## 2022-01-29 DIAGNOSIS — Z48815 Encounter for surgical aftercare following surgery on the digestive system: Secondary | ICD-10-CM | POA: Diagnosis not present

## 2022-02-03 DIAGNOSIS — Z932 Ileostomy status: Secondary | ICD-10-CM | POA: Diagnosis not present

## 2022-02-04 ENCOUNTER — Encounter (HOSPITAL_BASED_OUTPATIENT_CLINIC_OR_DEPARTMENT_OTHER): Payer: Self-pay | Admitting: Pharmacist

## 2022-02-04 ENCOUNTER — Encounter (HOSPITAL_COMMUNITY): Payer: Self-pay

## 2022-02-04 ENCOUNTER — Other Ambulatory Visit (HOSPITAL_BASED_OUTPATIENT_CLINIC_OR_DEPARTMENT_OTHER): Payer: Self-pay

## 2022-02-04 ENCOUNTER — Other Ambulatory Visit: Payer: Self-pay

## 2022-02-04 ENCOUNTER — Emergency Department (HOSPITAL_COMMUNITY): Payer: Medicaid Other

## 2022-02-04 ENCOUNTER — Emergency Department (HOSPITAL_COMMUNITY)
Admission: EM | Admit: 2022-02-04 | Discharge: 2022-02-04 | Disposition: A | Discharge status: Home / Self Care | Payer: Medicaid Other | Attending: Emergency Medicine | Admitting: Emergency Medicine

## 2022-02-04 DIAGNOSIS — C20 Malignant neoplasm of rectum: Secondary | ICD-10-CM | POA: Diagnosis not present

## 2022-02-04 DIAGNOSIS — K9419 Other complications of enterostomy: Secondary | ICD-10-CM | POA: Diagnosis not present

## 2022-02-04 DIAGNOSIS — G8918 Other acute postprocedural pain: Secondary | ICD-10-CM

## 2022-02-04 LAB — I-STAT CHEM 8, ED
BUN: 9 mg/dL (ref 6–20)
Calcium, Ion: 1.17 mmol/L (ref 1.15–1.40)
Chloride: 103 mmol/L (ref 98–111)
Creatinine, Ser: 1 mg/dL (ref 0.61–1.24)
Glucose, Bld: 80 mg/dL (ref 70–99)
HCT: 38 % — ABNORMAL LOW (ref 39.0–52.0)
Hemoglobin: 12.9 g/dL — ABNORMAL LOW (ref 13.0–17.0)
Potassium: 3.9 mmol/L (ref 3.5–5.1)
Sodium: 137 mmol/L (ref 135–145)
TCO2: 22 mmol/L (ref 22–32)

## 2022-02-04 LAB — COMPREHENSIVE METABOLIC PANEL
ALT: 12 U/L (ref 0–44)
AST: 18 U/L (ref 15–41)
Albumin: 4.1 g/dL (ref 3.5–5.0)
Alkaline Phosphatase: 78 U/L (ref 38–126)
Anion gap: 10 (ref 5–15)
BUN: 10 mg/dL (ref 6–20)
CO2: 22 mmol/L (ref 22–32)
Calcium: 8.9 mg/dL (ref 8.9–10.3)
Chloride: 101 mmol/L (ref 98–111)
Creatinine, Ser: 1.01 mg/dL (ref 0.61–1.24)
GFR, Estimated: >60 mL/min (ref >60)
Glucose, Bld: 81 mg/dL (ref 70–99)
Potassium: 3.7 mmol/L (ref 3.5–5.1)
Sodium: 133 mmol/L — ABNORMAL LOW (ref 135–145)
Total Bilirubin: 0.5 mg/dL (ref 0.3–1.2)
Total Protein: 7.5 g/dL (ref 6.5–8.1)

## 2022-02-04 LAB — CBC WITH DIFFERENTIAL/PLATELET
Abs Immature Granulocytes: 0 10*3/uL (ref 0.00–0.07)
Basophils Absolute: 0 10*3/uL (ref 0.0–0.1)
Basophils Relative: 1 %
Eosinophils Absolute: 0.1 10*3/uL (ref 0.0–0.5)
Eosinophils Relative: 1 %
HCT: 38 % — ABNORMAL LOW (ref 39.0–52.0)
Hemoglobin: 13 g/dL (ref 13.0–17.0)
Immature Granulocytes: 0 %
Lymphocytes Relative: 30 %
Lymphs Abs: 1.3 10*3/uL (ref 0.7–4.0)
MCH: 32.1 pg (ref 26.0–34.0)
MCHC: 34.2 g/dL (ref 30.0–36.0)
MCV: 93.8 fL (ref 80.0–100.0)
Monocytes Absolute: 0.3 10*3/uL (ref 0.1–1.0)
Monocytes Relative: 8 %
Neutro Abs: 2.5 10*3/uL (ref 1.7–7.7)
Neutrophils Relative %: 60 %
Platelets: 265 10*3/uL (ref 150–400)
RBC: 4.05 MIL/uL — ABNORMAL LOW (ref 4.22–5.81)
RDW: 11.7 % (ref 11.5–15.5)
WBC: 4.1 10*3/uL (ref 4.0–10.5)
nRBC: 0 % (ref 0.0–0.2)

## 2022-02-04 MED ORDER — FENTANYL CITRATE PF 50 MCG/ML IJ SOSY
50.0000 ug | PREFILLED_SYRINGE | Freq: Once | INTRAMUSCULAR | Status: AC
  Administered 2022-02-04: 50 ug via INTRAVENOUS
  Filled 2022-02-04: qty 1

## 2022-02-04 MED ORDER — KETOROLAC TROMETHAMINE 15 MG/ML IJ SOLN
15.0000 mg | Freq: Once | INTRAMUSCULAR | Status: DC
  Filled 2022-02-04: qty 1

## 2022-02-04 MED ORDER — MORPHINE SULFATE (PF) 4 MG/ML IV SOLN
4.0000 mg | Freq: Once | INTRAVENOUS | Status: AC
  Administered 2022-02-04: 4 mg via INTRAVENOUS
  Filled 2022-02-04: qty 1

## 2022-02-04 MED ORDER — SODIUM CHLORIDE (PF) 0.9 % IJ SOLN
INTRAMUSCULAR | Status: AC
  Administered 2022-02-04: 10 mL
  Filled 2022-02-04: qty 50

## 2022-02-04 MED ORDER — ONDANSETRON HCL 4 MG/2ML IJ SOLN: 4.0000 mg | Freq: Once | INTRAMUSCULAR | Status: AC

## 2022-02-04 MED ORDER — ONDANSETRON HCL 4 MG/2ML IJ SOLN
INTRAMUSCULAR | Status: AC
  Administered 2022-02-04: 4 mg via INTRAVENOUS
  Filled 2022-02-04: qty 2

## 2022-02-04 MED ORDER — IOHEXOL 300 MG/ML  SOLN
100.0000 mL | Freq: Once | INTRAMUSCULAR | Status: AC | PRN
  Administered 2022-02-04: 100 mL via INTRAVENOUS

## 2022-02-04 NOTE — Discharge Instructions (Signed)
Follow-up with ostomy care tomorrow.  Follow-up with surgeon as planned.  Return to ED for new or concerning symptoms.

## 2022-02-04 NOTE — Progress Notes (Signed)
Progress Note     Subjective: Pt seen in the office yesterday. Reports that stoma has been protruding out more since yesterday afternoon. Still having output. No nausea an vomiting. Pain around stoma and concerned about visible sutures.   Objective: Vital signs in last 24 hours: Temp:  [97.8 F (36.6 C)-98.5 F (36.9 C)] 97.8 F (36.6 C) (01/03 0959) Pulse Rate:  [60-87] 60 (01/03 1000) Resp:  [16-18] 17 (01/03 1000) BP: (104-126)/(65-108) 104/72 (01/03 1000) SpO2:  [96 %-100 %] 97 % (01/03 1000) Weight:  [74.8 kg] 74.8 kg (01/03 0119)    Intake/Output from previous day: No intake/output data recorded. Intake/Output this shift: No intake/output data recorded.  PE: General: pleasant, WD, thin male who is laying in bed in NAD Heart: regular, rate, and rhythm.   Lungs: Respiratory effort nonlabored Abd: soft, mild ttp around stoma, afferent limb with some edema but patent and some separation from skin edge superolaterally, efferent limb appears normal, incisions C/D/I, abdomen is non-distended Psych: A&Ox3 with an appropriate affect.    Lab Results:  Recent Labs    02/04/22 0145 02/04/22 0156  WBC 4.1  --   HGB 13.0 12.9*  HCT 38.0* 38.0*  PLT 265  --    BMET Recent Labs    02/04/22 0145 02/04/22 0156  NA 133* 137  K 3.7 3.9  CL 101 103  CO2 22  --   GLUCOSE 81 80  BUN 10 9  CREATININE 1.01 1.00  CALCIUM 8.9  --    PT/INR No results for input(s): "LABPROT", "INR" in the last 72 hours. CMP     Component Value Date/Time   NA 137 02/04/2022 0156   K 3.9 02/04/2022 0156   CL 103 02/04/2022 0156   CO2 22 02/04/2022 0145   GLUCOSE 80 02/04/2022 0156   BUN 9 02/04/2022 0156   CREATININE 1.00 02/04/2022 0156   CREATININE 1.02 12/05/2021 0810   CALCIUM 8.9 02/04/2022 0145   PROT 7.5 02/04/2022 0145   ALBUMIN 4.1 02/04/2022 0145   AST 18 02/04/2022 0145   AST 14 (L) 12/05/2021 0810   ALT 12 02/04/2022 0145   ALT 9 12/05/2021 0810   ALKPHOS 78  02/04/2022 0145   BILITOT 0.5 02/04/2022 0145   BILITOT 0.8 12/05/2021 0810   GFRNONAA >60 02/04/2022 0145   GFRNONAA >60 12/05/2021 0810   Lipase     Component Value Date/Time   LIPASE 30 01/24/2022 0210       Studies/Results: CT ABDOMEN PELVIS W CONTRAST  Result Date: 02/04/2022 CLINICAL DATA:  Status post ileostomy for rectal cancer. Burning and redness at stoma. EXAM: CT ABDOMEN AND PELVIS WITH CONTRAST TECHNIQUE: Multidetector CT imaging of the abdomen and pelvis was performed using the standard protocol following bolus administration of intravenous contrast. RADIATION DOSE REDUCTION: This exam was performed according to the departmental dose-optimization program which includes automated exposure control, adjustment of the mA and/or kV according to patient size and/or use of iterative reconstruction technique. CONTRAST:  128m OMNIPAQUE IOHEXOL 300 MG/ML  SOLN COMPARISON:  01/24/2022. FINDINGS: Lower chest: No acute abnormality. Hepatobiliary: There is evidence of prior hepatic wedge resections. No new liver lesion is seen. No biliary ductal dilatation. The gallbladder is without stones. Pancreas: Unremarkable. No pancreatic ductal dilatation or surrounding inflammatory changes. Spleen: Normal in size without focal abnormality. Adrenals/Urinary Tract: The adrenal glands are within normal limits. The kidneys enhance symmetrically. No renal calculus or hydronephrosis. The bladder is unremarkable. Stomach/Bowel: Stomach is within normal limits. Appendix  appears normal. No bowel obstruction, free air, or pneumatosis. An anastomotic site is present at the rectum. An ileostomy is noted in the right lower quadrant with questionable peristomal herniation of small bowel and mesentery. No subcutaneous fat stranding or abscess is seen at the ileostomy Vascular/Lymphatic: No significant vascular findings are present. No enlarged abdominal or pelvic lymph nodes. Reproductive: Prostate is unremarkable.  Other: A small amount of free fluid is noted in the perirectal space. Musculoskeletal: No acute or suspicious osseous abnormality. IMPRESSION: 1. Right lower quadrant ileostomy. There is questionable herniation of small bowel and mesentery at the ostomy site. No significant peristomal fat stranding or abscess. 2. Small amount of free fluid in the perirectal space, which may be postsurgical. Electronically Signed   By: Brett Fairy M.D.   On: 02/04/2022 03:35    Anti-infectives: Anti-infectives (From admission, onward)    None        Assessment/Plan  S/P  robotic assisted LAR and diverting loop ileostomy 01/15/22 Dr. Marcello Moores for metastatic rectal cancer - presented to ED with concern for stoma changes - mild prolapse on exam but stoma is functioning and patent. Ok to discharge home from a surgical standpoint. Patient has [place] RN coming out tomorrow and sees ostomy clinic Friday. Advised on smoking cessation as well.   LOS: 0 days     Norm Parcel, [facility] Surgery 02/04/2022, 11:06 AM Please see Amion for pager number during day hours 7:00am-4:30pm

## 2022-02-04 NOTE — ED Provider Notes (Signed)
Patient case assumed from previous provider, see their note for complete history and physical exam.  Patient essentially came to the ED after postop complication and possible bowel herniation to the ostomy.  Pending evaluation from in person surgical consult Dr. Marcello Moores in the morning.  Disposition is pending surgical consultation.  8:07 AM -I consulted with Dr. Leighton Ruff regarding this patient.  She states she does not feel she needs to evaluate the patient in person as she is able to see the picture on epic and personally reviewed the CT of the abdomen pelvis.  She states that there is mesentery in the ostomy site but there is no small bowel herniation and states there is no reason for surgical evaluation indication at this time.  She would recommend increasing his protein intake, outpatient follow-up with the ostomy nurse and to start smoking cessation.  From a surgical standpoint patient is cleared and should already have an appoint with the ostomy clinic per Dr. Marcello Moores.  8:25 AM -I evaluated the patient.  His pain is improved.  He is concerned about the current fit of the ostomy supplies stating there is not a good seal due to the herniation/skin adhesive area.  I will consult ostomy nurse given he does not have an appointment until tomorrow with the ostomy team.  8:39 AM -I consulted ostomy, see their note for consult note but they are declining to see the patient and requesting surgery evaluate the patient due to abnormal wound.  9:42 AM -spoke with PA Claiborne Billings on-call with surgical team.  She is recommending we thorough search [place] over the wound and they will come evaluate the patient and try to reduce further.  Appreciate her help in consult.  8:52 AM -sugar applied over the ostomy, I captured another photo of current presentation. Similar to overnight time PE.    9:31 AM - patient reports burning pain. Toradol ordered.   11:10 AM -surgery evaluated patient.  Possibly better position.   Stable for discharge with outpatient follow-up with Dr. Marcello Moores at this time.         Sherrill Raring, PA-C 02/04/22 [place], [place] A, DO 02/06/22 [phone]

## 2022-02-04 NOTE — ED Triage Notes (Signed)
Pt had a robotic assisted lower anterior resection diverting ileostomy for rectal cancer on 12/14. Today he heard a pop when he was changing his bag and states there are sutures that have come out and states it is burning. He reports the surrounding area of stoma appears red as well. Denies fevers.

## 2022-02-04 NOTE — ED Provider Notes (Signed)
[facility] DEPT Provider Note   CSN: [id number] Arrival date & time: 02/04/22  0107     History  Chief Complaint  Patient presents with   Post-op Problem    Jeremiah Allen is a 37 y.o. male with metastatic rectal cancer (metastases to the liver) who underwent robotic assisted low anterior resection and diverting ileostomy on 01/15/2022 with Dr. Joyice Faster.  He states that this evening he stood up from a chair and went to change his ostomy bag felt a popping sensation followed by immediate burning sensation beneath his ostomy.  States that he can visualize sutures that he could not previously see and endorses that the area surrounding his ostomy is much more erythematous than normal.  Denies any fevers or chills.  States the ostomy is functioning well, no fevers or chills recently.  Review patient is documented as having "complete clinical response" to neoadjuvant chemotherapy and surgical intervention by oncologist Dr. Benay Spice. Additional hx of bipolar 1 disorder, prior nicotine dependence.   HPI     Home Medications Prior to Admission medications   Medication Sig Start Date End Date Taking? Authorizing Provider  acetaminophen (TYLENOL) 500 MG tablet Take 500-1,000 mg by mouth every 6 (six) hours as needed for mild pain or moderate pain.    [provider]  albuterol (VENTOLIN HFA) 108 (90 Base) MCG/ACT inhaler Inhale 1-2 puffs into the lungs every 6 (six) hours as needed for wheezing or shortness of breath.    [provider]  famotidine (PEPCID) 20 MG tablet Take 20 mg by mouth 2 (two) times daily as needed for heartburn or indigestion.    [provider]  gabapentin (NEURONTIN) 600 MG tablet Take 1 tablet (600 mg total) by mouth 3 (three) times daily. Patient not taking: Reported on 01/27/2022 08/21/21   Ladell Pier, MD  hydrOXYzine (ATARAX) 25 MG tablet Take 1 tablet (25 mg total) by mouth 3 (three) times daily as  needed. Patient taking differently: Take 25 mg by mouth 3 (three) times daily. 10/31/21   Owens Shark, NP  loperamide (IMODIUM A-D) 2 MG tablet Take 1 tablet (2 mg total) by mouth 4 (four) times daily as needed (take for increased ostomy outputs). Patient not taking: Reported on 01/27/2022 03/88/82   Leighton Ruff, MD  methocarbamol (ROBAXIN) 500 MG tablet Take 2 tablets (1,000 mg total) by mouth every 6 (six) hours as needed for muscle spasms. 08/21/21   Ladell Pier, MD  methocarbamol (ROBAXIN) 500 MG tablet Take 1 tablet (500 mg total) by mouth 3 (three) times daily as needed for up to 10 days 01/20/22     ondansetron (ZOFRAN) 8 MG tablet Take 1 tablet (8 mg total) by mouth every 8 (eight) hours as needed for nausea or vomiting (May start using after day 3 for nausea and vomiting). 09/10/21   Ladell Pier, MD  oxyCODONE (OXY IR/ROXICODONE) 5 MG immediate release tablet Take 1 - 2 tablets (5 - 10 mg total) by mouth every 6 (six) hours as needed for moderate pain. 80/03/49   Leighton Ruff, MD  prochlorperazine (COMPAZINE) 10 MG tablet Take 1 tablet (10 mg total) by mouth every 6 (six) hours as needed for nausea or vomiting. 04/25/21   Ladell Pier, MD      Allergies    Crab extract allergy skin test and Dilaudid [hydromorphone]    Review of Systems   Review of Systems  Constitutional:        Post-op  problem    Physical Exam Updated Vital Signs BP 113/78   Pulse 65   Temp 98.5 F (36.9 C) (Oral)   Resp 18   Ht '5\' 9"'$  (1.753 m)   Wt 74.8 kg   SpO2 100%   BMI 24.37 kg/m  Physical Exam Vitals and nursing note reviewed.  Constitutional:      Appearance: He is not ill-appearing or toxic-appearing.  HENT:     Head: Normocephalic and atraumatic.     Mouth/Throat:     Mouth: Mucous membranes are moist.     Pharynx: No oropharyngeal exudate or posterior oropharyngeal erythema.  Eyes:     General:        Right eye: No discharge.        Left eye: No discharge.      Extraocular Movements: Extraocular movements intact.     Conjunctiva/sclera: Conjunctivae normal.     Pupils: Pupils are equal, round, and reactive to light.  Cardiovascular:     Rate and Rhythm: Normal rate and regular rhythm.     Pulses: Normal pulses.     Heart sounds: Normal heart sounds. No murmur heard. Pulmonary:     Effort: Pulmonary effort is normal. No respiratory distress.     Breath sounds: Normal breath sounds. No wheezing or rales.  Abdominal:     General: A surgical scar is present. Bowel sounds are normal. There is no distension.     Palpations: Abdomen is soft.     Tenderness: There is abdominal tenderness (Tender surrounding ostomy site.).       Comments: Mild erythema of skin where adhesive from ostomy bag was removed, otherwise no erythema or induration surrounding ostomy.    Musculoskeletal:        General: No deformity.     Cervical back: Neck supple.  Skin:    General: Skin is warm and dry.  Neurological:     Mental Status: He is alert. Mental status is at baseline.  Psychiatric:        Mood and Affect: Mood normal.     ED Results / Procedures / Treatments   Labs (all labs ordered are listed, but only abnormal results are displayed) Labs Reviewed  CBC WITH DIFFERENTIAL/PLATELET - Abnormal; Notable for the following components:      Result Value   RBC 4.05 (*)    HCT 38.0 (*)    All other components within normal limits  COMPREHENSIVE METABOLIC PANEL - Abnormal; Notable for the following components:   Sodium 133 (*)    All other components within normal limits  I-STAT CHEM 8, ED - Abnormal; Notable for the following components:   Hemoglobin 12.9 (*)    HCT 38.0 (*)    All other components within normal limits    EKG None  Radiology CT ABDOMEN PELVIS W CONTRAST  Result Date: 02/04/2022 CLINICAL DATA:  Status post ileostomy for rectal cancer. Burning and redness at stoma. EXAM: CT ABDOMEN AND PELVIS WITH CONTRAST TECHNIQUE: Multidetector CT  imaging of the abdomen and pelvis was performed using the standard protocol following bolus administration of intravenous contrast. RADIATION DOSE REDUCTION: This exam was performed according to the departmental dose-optimization program which includes automated exposure control, adjustment of the mA and/or kV according to patient size and/or use of iterative reconstruction technique. CONTRAST:  121m OMNIPAQUE IOHEXOL 300 MG/ML  SOLN COMPARISON:  01/24/2022. FINDINGS: Lower chest: No acute abnormality. Hepatobiliary: There is evidence of prior hepatic wedge resections. No new liver lesion is seen.  No biliary ductal dilatation. The gallbladder is without stones. Pancreas: Unremarkable. No pancreatic ductal dilatation or surrounding inflammatory changes. Spleen: Normal in size without focal abnormality. Adrenals/Urinary Tract: The adrenal glands are within normal limits. The kidneys enhance symmetrically. No renal calculus or hydronephrosis. The bladder is unremarkable. Stomach/Bowel: Stomach is within normal limits. Appendix appears normal. No bowel obstruction, free air, or pneumatosis. An anastomotic site is present at the rectum. An ileostomy is noted in the right lower quadrant with questionable peristomal herniation of small bowel and mesentery. No subcutaneous fat stranding or abscess is seen at the ileostomy Vascular/Lymphatic: No significant vascular findings are present. No enlarged abdominal or pelvic lymph nodes. Reproductive: Prostate is unremarkable. Other: A small amount of free fluid is noted in the perirectal space. Musculoskeletal: No acute or suspicious osseous abnormality. IMPRESSION: 1. Right lower quadrant ileostomy. There is questionable herniation of small bowel and mesentery at the ostomy site. No significant peristomal fat stranding or abscess. 2. Small amount of free fluid in the perirectal space, which may be postsurgical. Electronically Signed   By: Brett Fairy M.D.   On: 02/04/2022  03:35    Procedures Procedures    Medications Ordered in ED Medications  iohexol (OMNIPAQUE) 300 MG/ML solution 100 mL (100 mLs Intravenous Contrast Given 02/04/22 0243)  sodium chloride (PF) 0.9 % injection (10 mLs  Given 02/04/22 0341)  morphine (PF) 4 MG/ML injection 4 mg (4 mg Intravenous Given 02/04/22 0086)    ED Course/ Medical Decision Making/ A&P Clinical Course as of 02/04/22 0602  Wed Feb 04, 2022  0402 Consult to Dr. Ninfa Linden, general surgery, who is agreeable to consulting on the patient. States he will exam the patient. I appreciate his collaboration in the care of this patient.  [RS]  0430 Consult call back from Dr. Ninfa Linden, general surgeon who states that CT scan and photograph could be normal prolapse of ostomy.  Recommends inpatient evaluation of patient by his primary surgeon Dr. Marcello Moores later this morning when she presents to [facility] for surgical cases later this morning.  I appreciate his collaboration in the care of this patient.  Patient with significant improvement in his pain after administration of morphine though still does have significant pain with movement.  Patient agreeable to waiting in the emergency department for in person eval by Dr. Marcello Moores. [RS]    Clinical Course User Index [RS] Isamar Wellbrock, Gypsy Balsam, PA-C                           Medical Decision Making 37 year old male presents with concern for postoperative problem inside of his ostomy started today after he changed position and felt a pulling sensation.  Normal hemodynamics, patient very uncomfortable appearing.  Cardiopulmonary exam unremarkable, abdominal exam as above, see photos in physical exam section  Differential diagnosis includes but is limited to herniation of bowel with her ostomy site, infection, wound dehiscence.  Amount and/or Complexity of Data Reviewed Labs: ordered.    Details: CBC without leukocytosis or anemia, CMP unremarkable.   Radiology: ordered.    Details: CT of the  abdomen pelvis right lower quadrant ileostomy with concern for herniation of the small bowel mesentery to the ostomy site without periosteum prostrating or abscess.  Likely postsurgical scant free fluid in the perirectal space.    Risk Prescription drug management.   Patient pending completion of in person evaluation by surgeon Dr. Marcello Moores later this morning.  Remains significantly improved after IV analgesia  in the ED.  Care of this patient signed out to oncoming ED provider Sherrill Raring, PA-C at time of shift change.  All pertinent HPI physical exam and laboratory findings were discussed with her prior to my departure.  Disposition ultimately pending completion of evaluation by the general surgeon and clinical decision making by oncoming ED team.  Mikeal Hawthorne and his partner voiced understanding of his medical evaluation and treatment plan. Each of their questions answered to their expressed satisfaction.   This chart was dictated using voice recognition software, Dragon. Despite the best efforts of this provider to proofread and correct errors, errors may still occur which can change documentation meaning.'  Final Clinical Impression(s) / ED Diagnoses Final diagnoses:  None    Rx / DC Orders ED Discharge Orders     None         Aura Dials 02/04/22 0602    Quintella Reichert, MD 02/14/22 [phone]

## 2022-02-04 NOTE — Consult Note (Addendum)
[place] Nurse ostomy consult note Stoma type/location: Consult requested for ostomy assistance.   Consult performed remotely after review of progress notes and photos in the EMR.  Pt is familiar to [facility] team from recent ileostomy surgery 12/14.  Stoma is red and viable, appears to have some prolapse and mucutaneous separation to the edges.   Secure chat message sent to the ER team as follows, "There is only one ostomy nurse working today for the entire system and she is tied up at [facility].  I am working from home with Covid and trying to handle things remotely.  I have reviewed the photo and think this patient needs to be evaluated by the surgical team before discharge.  I will put in orders for supplies and your secretary can call materials and have them tubed up for use.  Thank-you for your understanding"  Supply numbers provided for patient and staff nurse use.  Pt should followup with the surgeon after discharge and could benefit from referral to the outpatient ostomy clinic.  Please re-consult if further assistance is needed.  Thank-you,  Julien Girt MSN, [place], [place], [place], [place]

## 2022-02-05 ENCOUNTER — Other Ambulatory Visit (HOSPITAL_BASED_OUTPATIENT_CLINIC_OR_DEPARTMENT_OTHER): Payer: Self-pay

## 2022-02-05 ENCOUNTER — Other Ambulatory Visit: Payer: Self-pay | Admitting: Nurse Practitioner

## 2022-02-05 DIAGNOSIS — C20 Malignant neoplasm of rectum: Secondary | ICD-10-CM

## 2022-02-05 MED ORDER — HYDROXYZINE HCL 25 MG PO TABS
25.0000 mg | ORAL_TABLET | Freq: Three times a day (TID) | ORAL | 0 refills | Status: DC | PRN
  Filled 2022-02-05: qty 60, 20d supply, fill #0

## 2022-02-05 MED ORDER — OXYCODONE HCL 5 MG PO TABS
5.0000 mg | ORAL_TABLET | Freq: Four times a day (QID) | ORAL | 0 refills | Status: DC | PRN
  Filled 2022-02-05: qty 30, 8d supply, fill #0

## 2022-02-06 ENCOUNTER — Other Ambulatory Visit (HOSPITAL_BASED_OUTPATIENT_CLINIC_OR_DEPARTMENT_OTHER): Payer: Self-pay

## 2022-02-06 ENCOUNTER — Ambulatory Visit (HOSPITAL_COMMUNITY)
Admission: RE | Admit: 2022-02-06 | Discharge: 2022-02-06 | Disposition: A | Discharge status: Home / Self Care | Payer: Medicaid Other | Source: Ambulatory Visit | Attending: Nurse Practitioner | Admitting: Nurse Practitioner

## 2022-02-06 DIAGNOSIS — R5383 Other fatigue: Secondary | ICD-10-CM | POA: Diagnosis not present

## 2022-02-06 DIAGNOSIS — K9419 Other complications of enterostomy: Secondary | ICD-10-CM | POA: Diagnosis not present

## 2022-02-06 DIAGNOSIS — L24B3 Irritant contact dermatitis related to fecal or urinary stoma or fistula: Secondary | ICD-10-CM | POA: Insufficient documentation

## 2022-02-06 DIAGNOSIS — Z432 Encounter for attention to ileostomy: Secondary | ICD-10-CM | POA: Insufficient documentation

## 2022-02-06 MED ORDER — METHOCARBAMOL 500 MG PO TABS
ORAL_TABLET | ORAL | 0 refills | Status: DC
  Filled 2022-02-06: qty 30, 10d supply, fill #0

## 2022-02-06 NOTE — Progress Notes (Signed)
[facility]   Reason for visit:  RLQ ileostomy HPI:   Past Medical History:  Diagnosis Date   Anxiety    Bipolar disorder ([place])    Cancer (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      albuterol (VENTOLIN HFA) 108 (90 Base) MCG/ACT inhaler Inhale 1-2 puffs into the lungs every 6 (six) hours as needed for wheezing or shortness of breath.      famotidine (PEPCID) 20 MG tablet Take 20 mg by mouth 2 (two) times daily as needed for heartburn or indigestion.      gabapentin (NEURONTIN) 600 MG tablet Take 1 tablet (600 mg total) by mouth 3 (three) times daily. (Patient not taking: Reported on 01/27/2022) 90 tablet 0    hydrOXYzine (ATARAX) 25 MG tablet Take 1 tablet (25 mg total) by mouth 3 (three) times daily as needed for anxiety. 60 tablet 0    loperamide (IMODIUM A-D) 2 MG tablet Take 1 tablet (2 mg total) by mouth 4 (four) times daily as needed (take for increased ostomy outputs). (Patient not taking: Reported on 01/27/2022) 30 tablet 3    methocarbamol (ROBAXIN) 500 MG tablet Take 2 tablets (1,000 mg total) by mouth every 6 (six) hours as needed for muscle spasms. 60 tablet 0    methocarbamol (ROBAXIN) 500 MG tablet Take 1 tablet (500 mg total) by mouth 3 (three) times daily as needed for up to 10 days 30  tablet 0    ondansetron (ZOFRAN) 8 MG tablet Take 1 tablet (8 mg total) by mouth every 8 (eight) hours as needed for nausea or vomiting (May start using after day 3 for nausea and vomiting). 30 tablet 3    oxyCODONE (OXY IR/ROXICODONE) 5 MG immediate release tablet Take 1 tablet (5 mg total) by mouth every 6 (six) hours as needed for pain 30 tablet 0    prochlorperazine (COMPAZINE) 10 MG tablet Take 1 tablet (10 mg total) by mouth every 6 (six) hours as needed for nausea or vomiting. 30 tablet 2    No current facility-administered medications for this encounter.   ROS  Review of Systems  Constitutional:  Positive for fatigue.  Gastrointestinal:        RLQ ileostomy  Skin:  Positive for rash.       Peristomal irritation  All other systems reviewed and are negative.  Vital signs:  BP 121/69 (BP Location: Right Arm)   Pulse 81   Temp 97.9 F (36.6 C) (Oral)   Resp 18   SpO2 99%  Exam:  Physical Exam Vitals reviewed.  Constitutional:      Appearance: Normal appearance.  Abdominal:     General: Abdomen is flat.  Palpations: Abdomen is soft.     Comments: RLQ ileostomy Midline abdominal surgical incision  Skin:    General: Skin is warm and dry.  Neurological:     General: No focal deficit present.     Mental Status: He is alert and oriented to person, place, and time.  Psychiatric:        Mood and Affect: Mood normal.        Behavior: Behavior normal.     Stoma type/location:  2 " budded pink and moist RLQ  Stomal assessment/size:  2"  Peristomal assessment:  erythema and some irritation from leakage  Treatment options for stomal/peristomal skin: barrier ring  continue 2 piece pouch Output: soft brown stool liquid at times Ostomy pouching: 2pc.  Education provided:  No reports dry mouth, dizziness, concentrated urine.  Feels good  is consuming liquids as tolerate to avoid dehydration.     Impression/dx  ileostomy Discussion  Continue in 2 piece  would like to add  ostomy belt.  Will need medium.  Wife with patient  Performs most of his care.  They have been enrolled with secure start.  Will enroll with [facility] for ongoing supplies.  Plan  See back in 2 weeks    Visit time: 40 minutes.   Jeremiah Moras FNP-BC

## 2022-02-06 NOTE — Discharge Instructions (Signed)
COntinue 2 piece pouch with barrier ring Stoma powder and skin prep I will order a medium belt from [facility].

## 2022-02-10 DIAGNOSIS — Z932 Ileostomy status: Secondary | ICD-10-CM | POA: Insufficient documentation

## 2022-02-10 DIAGNOSIS — Z432 Encounter for attention to ileostomy: Secondary | ICD-10-CM | POA: Insufficient documentation

## 2022-02-12 ENCOUNTER — Encounter: Payer: Self-pay | Admitting: *Deleted

## 2022-02-12 NOTE — Progress Notes (Signed)
FMLA for Jeremiah Allen sent to [facility] @ [phone].

## 2022-02-13 ENCOUNTER — Ambulatory Visit (HOSPITAL_COMMUNITY)
Admission: RE | Admit: 2022-02-13 | Discharge: 2022-02-13 | Disposition: A | Discharge status: Home / Self Care | Payer: Medicaid Other | Source: Ambulatory Visit | Attending: Family Medicine | Admitting: Family Medicine

## 2022-02-13 DIAGNOSIS — L24B3 Irritant contact dermatitis related to fecal or urinary stoma or fistula: Secondary | ICD-10-CM

## 2022-02-13 DIAGNOSIS — Z432 Encounter for attention to ileostomy: Secondary | ICD-10-CM | POA: Diagnosis not present

## 2022-02-13 DIAGNOSIS — K9419 Other complications of enterostomy: Secondary | ICD-10-CM | POA: Diagnosis not present

## 2022-02-13 NOTE — Progress Notes (Signed)
[place]   Reason for visit:  RLQ ileostomy, prolapse and some stomal irritation, sloughing.  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   Family History  Problem Relation Age of Onset   Heart disease Mother    52 / Stillbirths Sister    Asthma Sister    Stroke Sister        at birth   Developmental delay Brother    Allergies  Allergen Reactions   Crab Extract Allergy Skin Test Anaphylaxis and Swelling    Throat swelling (CRAB ALLERGY ONLY)   Dilaudid [Hydromorphone] Itching and Other (See Comments)    Skin tingling sensations    Current Outpatient Medications  Medication Sig Dispense Refill Last Dose   acetaminophen (TYLENOL) 500 MG tablet Take 500-1,000 mg by mouth every 6 (six) hours as needed for mild pain or moderate pain.      albuterol (VENTOLIN HFA) 108 (90 Base) MCG/ACT inhaler Inhale 1-2 puffs into the lungs every 6 (six) hours as needed for wheezing or shortness of breath.      famotidine (PEPCID) 20 MG tablet Take 20 mg by mouth 2 (two) times daily as needed for heartburn or indigestion.      gabapentin (NEURONTIN) 600 MG tablet Take 1 tablet (600 mg total) by mouth 3 (three) times daily. (Patient not taking: Reported on 01/27/2022) 90 tablet 0    hydrOXYzine (ATARAX) 25 MG tablet Take 1 tablet (25 mg total) by mouth 3 (three) times daily as needed for anxiety. 60 tablet 0    loperamide (IMODIUM A-D) 2 MG tablet Take 1 tablet (2 mg total) by mouth 4 (four) times daily as needed (take for increased ostomy outputs). (Patient not taking: Reported on 01/27/2022) 30 tablet 3    methocarbamol (ROBAXIN) 500 MG tablet Take 2 tablets (1,000 mg total) by mouth every 6 (six) hours as needed for muscle spasms. 60 tablet 0    methocarbamol (ROBAXIN) 500 MG tablet Take 1  tablet (500 mg total) by mouth 3 (three) times daily as needed for up to 10 days 30 tablet 0    ondansetron (ZOFRAN) 8 MG tablet Take 1 tablet (8 mg total) by mouth every 8 (eight) hours as needed for nausea or vomiting (May start using after day 3 for nausea and vomiting). 30 tablet 3    oxyCODONE (OXY IR/ROXICODONE) 5 MG immediate release tablet Take 1 tablet (5 mg total) by mouth every 6 (six) hours as needed for pain 30 tablet 0    prochlorperazine (COMPAZINE) 10 MG tablet Take 1 tablet (10 mg total) by mouth every 6 (six) hours as needed for nausea or vomiting. 30 tablet 2    No current facility-administered medications for this encounter.   ROS  Review of Systems  Gastrointestinal:        RLQ ileostomy, prolapsed  Genitourinary: Negative.   Skin:  Positive for rash.       Peristomal tenderness  All other systems reviewed and are negative.  Vital signs:  BP 125/75   Pulse 75   Temp 98 F (36.7 C) (Oral)   Resp 18   Ht '5\' 9"'$  (1.753 m)   Wt 72.6 kg   SpO2 99%   BMI 23.63 kg/m  Exam:  Physical Exam Vitals reviewed.  Constitutional:      Appearance: Normal appearance.  HENT:     Mouth/Throat:     Mouth: Mucous membranes are moist.  Abdominal:     Palpations: Abdomen is soft.  Skin:    General: Skin is warm and dry.     Findings: Rash present.  Neurological:     Mental Status: He is alert and oriented to person, place, and time.  Psychiatric:        Mood and Affect: Mood normal.        Behavior: Behavior normal.     Stoma type/location:  RLQ loop ileostomy, prolapsed with some telescoping noted.  Mucus fistula (efferent limb)  site directly below afferent limb.  Bleeding along this bottom edge and tenderness.   Stomal assessment/size:  1 1 /4" pink and moist, telescoping and extends 4 cm today.  There is a 2.5 cm patch of sloughing mucosa from 11 to 12 o'clock.  I explain to patient and wife that this could be a result of stomal trauma as the stoma moves in and out,  and barrier opening should be cut larger.  Barrier ring will be utilized to protect intact skin.  This area has improved since last visit.  Peristomal assessment:  Tenderness on bottom half of peristomal area.  Must ensure that barrier ring is not placed over the mucus fistula.  Patient has difficulty visualizing this area.  Wife assists most of the time.  Patient is about to travel alone (to [place], [place]) and will perform pouch change Treatment options for stomal/peristomal skin: barrier ring, stoma powder and skin prep.  Uses barrier extenders beneath the barrier, over the barrier ring and feels this seals the system better.  Output: liquid brown stool Ostomy pouching: 2pc. 2 3/4" pouch with barrier ring, barrier extenders (with ceraplus ceramides to protect skin).  Uses stoma powder, skin prep and adhesive remover.  Is already set up with [facility].  Education provided:  See above- cut opening to 1 3/4" to promote stomal telescoping without trauma    Impression/dx  Peristomal tenderness ileostomy Discussion  See above With upcoming travel, discussed adequate hydration while on the road.  Do not avoid eating/drinking to avoid pouch emptying.  Risk of dehydration discussed.  Sips gatorade, water.  Has only one cup coffee daily.  Reports clear yellow urine Plan  See back in 2 weeks.     Visit time: 45 minutes.   Domenic Moras FNP-BC

## 2022-02-14 NOTE — Discharge Instructions (Signed)
Cut opening larger Continue to drink adequate fluids Chew food well to avoid blockage.  Report abdominal pain with no output.

## 2022-02-16 ENCOUNTER — Other Ambulatory Visit: Payer: Self-pay | Admitting: Oncology

## 2022-02-16 ENCOUNTER — Telehealth: Payer: Self-pay | Admitting: *Deleted

## 2022-02-16 ENCOUNTER — Encounter (HOSPITAL_BASED_OUTPATIENT_CLINIC_OR_DEPARTMENT_OTHER): Payer: Self-pay | Admitting: Pharmacist

## 2022-02-16 ENCOUNTER — Other Ambulatory Visit (HOSPITAL_BASED_OUTPATIENT_CLINIC_OR_DEPARTMENT_OTHER): Payer: Self-pay

## 2022-02-16 DIAGNOSIS — C20 Malignant neoplasm of rectum: Secondary | ICD-10-CM

## 2022-02-16 NOTE — Telephone Encounter (Signed)
Received refill request for hydroxyzine: last filled #60 on 02/05/22 (confirmed w/pharmacy) which should be 20 day supply. Spoke with his fiance', Renee Ramus and made her aware it is too early to fill this. She admits he may have taken more than prescribed a couple times. He does still have some in hand. Requested she remind him that he is only to take as prescribed and no more. Refill denied.

## 2022-02-17 ENCOUNTER — Other Ambulatory Visit: Payer: Self-pay | Admitting: General Surgery

## 2022-02-17 DIAGNOSIS — K913 Postprocedural intestinal obstruction, unspecified as to partial versus complete: Secondary | ICD-10-CM

## 2022-02-18 ENCOUNTER — Other Ambulatory Visit (HOSPITAL_BASED_OUTPATIENT_CLINIC_OR_DEPARTMENT_OTHER): Payer: Self-pay

## 2022-02-20 ENCOUNTER — Ambulatory Visit (HOSPITAL_COMMUNITY)
Admission: RE | Admit: 2022-02-20 | Discharge: 2022-02-20 | Disposition: A | Discharge status: Home / Self Care | Payer: Medicaid Other | Source: Ambulatory Visit | Attending: Family Medicine | Admitting: Family Medicine

## 2022-02-20 DIAGNOSIS — L24B3 Irritant contact dermatitis related to fecal or urinary stoma or fistula: Secondary | ICD-10-CM | POA: Diagnosis not present

## 2022-02-20 DIAGNOSIS — Z432 Encounter for attention to ileostomy: Secondary | ICD-10-CM | POA: Insufficient documentation

## 2022-02-20 NOTE — Progress Notes (Signed)
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   Family History  Problem Relation Age of Onset   Heart disease Mother    54 / Stillbirths Sister    Asthma Sister    Stroke Sister        at birth   Developmental delay Brother    Allergies  Allergen Reactions   Crab Extract Allergy Skin Test Anaphylaxis and Swelling    Throat swelling (CRAB ALLERGY ONLY)   Dilaudid [Hydromorphone] Itching and Other (See Comments)    Skin tingling sensations    Current Outpatient Medications  Medication Sig Dispense Refill Last Dose   acetaminophen (TYLENOL) 500 MG tablet Take 500-1,000 mg by mouth every 6 (six) hours as needed for mild pain or moderate pain.      albuterol (VENTOLIN HFA) 108 (90 Base) MCG/ACT inhaler Inhale 1-2 puffs into the lungs every 6 (six) hours as needed for wheezing or shortness of breath.      famotidine (PEPCID) 20 MG tablet Take 20 mg by mouth 2 (two) times daily as needed for heartburn or indigestion.      gabapentin (NEURONTIN) 600 MG tablet Take 1 tablet (600 mg total) by mouth 3 (three) times daily. (Patient not taking: Reported on 01/27/2022) 90 tablet 0    hydrOXYzine (ATARAX) 25 MG tablet Take 1 tablet (25 mg total) by mouth 3 (three) times daily as needed for anxiety. 60 tablet 0    loperamide (IMODIUM A-D) 2 MG tablet Take 1 tablet (2 mg total) by mouth 4 (four) times daily as needed (take for increased ostomy outputs). (Patient not taking: Reported on 01/27/2022) 30 tablet 3    methocarbamol (ROBAXIN) 500 MG tablet Take 2 tablets (1,000 mg total) by mouth every 6 (six) hours as needed for muscle spasms. 60 tablet 0    methocarbamol (ROBAXIN) 500 MG tablet Take 1 tablet (500 mg total) by mouth 3 (three) times daily as needed for up to 10 days 30  tablet 0    ondansetron (ZOFRAN) 8 MG tablet Take 1 tablet (8 mg total) by mouth every 8 (eight) hours as needed for nausea or vomiting (May start using after day 3 for nausea and vomiting). 30 tablet 3    oxyCODONE (OXY IR/ROXICODONE) 5 MG immediate release tablet Take 1 tablet (5 mg total) by mouth every 6 (six) hours as needed for pain 30 tablet 0    prochlorperazine (COMPAZINE) 10 MG tablet Take 1 tablet (10 mg total) by mouth every 6 (six) hours as needed for nausea or vomiting. 30 tablet 2    No current facility-administered medications for this encounter.   ROS  Review of Systems  Gastrointestinal:        RLQ ileostomy  Skin:  Positive for rash and wound.  Psychiatric/Behavioral: Negative.    All other systems reviewed and are negative. Vital signs:  BP 115/66 (BP Location: Right Arm)   Pulse 74   Temp 98 F (36.7 C) (Oral)   Resp 18   SpO2 97%  Exam:  Physical Exam Vitals reviewed.  Constitutional:      Appearance: Normal appearance.  HENT:     Mouth/Throat:     Mouth: Mucous membranes are moist.  Abdominal:  Palpations: Abdomen is soft.  Skin:    General: Skin is warm and dry.     Findings: Erythema and rash present.  Neurological:     Mental Status: He is alert and oriented to person, place, and time.  Psychiatric:        Mood and Affect: Mood normal.        Behavior: Behavior normal.    Stoma type/location:  RLQ ileostomy Stomal assessment/size:  1 3/8" pink moist and budded, flush at 3 o'clock.  Mucus fistula is distal and sits beneath the afferent limb Peristomal assessment:  peristomal skin breakdown from 4 to 7 o'clcock Treatment options for stomal/peristomal skin: stoma powder, skin prep , barrier ring (barrier extenders beneath pouch due to proximity to umbilicus.  1 piece convex pou Output: liquid brown stool Ostomy pouching: 1pc. Convex  traveled to [place] and back for work with no issues.  Education provided:  continue same pouch with belt     Impression/dx  Contact dermatitis Flush ileostomy Discussion  See back in one week due to skin breakdown Plan  Set up with [facility] for supplies.      Visit time: 40 minutes.   Domenic Moras FNP-BC

## 2022-02-23 ENCOUNTER — Other Ambulatory Visit (HOSPITAL_BASED_OUTPATIENT_CLINIC_OR_DEPARTMENT_OTHER): Payer: Self-pay

## 2022-02-25 ENCOUNTER — Other Ambulatory Visit (HOSPITAL_BASED_OUTPATIENT_CLINIC_OR_DEPARTMENT_OTHER): Payer: Self-pay

## 2022-02-25 ENCOUNTER — Encounter (HOSPITAL_BASED_OUTPATIENT_CLINIC_OR_DEPARTMENT_OTHER): Payer: Self-pay | Admitting: Pharmacist

## 2022-02-27 ENCOUNTER — Ambulatory Visit (HOSPITAL_COMMUNITY)
Admission: RE | Admit: 2022-02-27 | Discharge: 2022-02-27 | Disposition: A | Discharge status: Home / Self Care | Payer: Medicaid Other | Source: Ambulatory Visit | Attending: Nurse Practitioner | Admitting: Nurse Practitioner

## 2022-02-27 ENCOUNTER — Ambulatory Visit
Admission: RE | Admit: 2022-02-27 | Discharge: 2022-02-27 | Disposition: A | Discharge status: Home / Self Care | Payer: Medicaid Other | Source: Ambulatory Visit | Attending: General Surgery | Admitting: General Surgery

## 2022-02-27 DIAGNOSIS — K913 Postprocedural intestinal obstruction, unspecified as to partial versus complete: Secondary | ICD-10-CM

## 2022-02-27 DIAGNOSIS — Z432 Encounter for attention to ileostomy: Secondary | ICD-10-CM | POA: Diagnosis present

## 2022-02-27 DIAGNOSIS — L24B3 Irritant contact dermatitis related to fecal or urinary stoma or fistula: Secondary | ICD-10-CM

## 2022-02-27 DIAGNOSIS — Z85048 Personal history of other malignant neoplasm of rectum, rectosigmoid junction, and anus: Secondary | ICD-10-CM | POA: Insufficient documentation

## 2022-02-27 DIAGNOSIS — Z932 Ileostomy status: Secondary | ICD-10-CM | POA: Insufficient documentation

## 2022-02-27 NOTE — Progress Notes (Signed)
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   Family History  Problem Relation Age of Onset   Heart disease Mother    66 / Stillbirths Sister    Asthma Sister    Stroke Sister        at birth   Developmental delay Brother    Allergies  Allergen Reactions   Crab Extract Allergy Skin Test Anaphylaxis and Swelling    Throat swelling (CRAB ALLERGY ONLY)   Dilaudid [Hydromorphone] Itching and Other (See Comments)    Skin tingling sensations    Current Outpatient Medications  Medication Sig Dispense Refill Last Dose   acetaminophen (TYLENOL) 500 MG tablet Take 500-1,000 mg by mouth every 6 (six) hours as needed for mild pain or moderate pain.      albuterol (VENTOLIN HFA) 108 (90 Base) MCG/ACT inhaler Inhale 1-2 puffs into the lungs every 6 (six) hours as needed for wheezing or shortness of breath.      famotidine (PEPCID) 20 MG tablet Take 20 mg by mouth 2 (two) times daily as needed for heartburn or indigestion.      gabapentin (NEURONTIN) 600 MG tablet Take 1 tablet (600 mg total) by mouth 3 (three) times daily. (Patient not taking: Reported on 01/27/2022) 90 tablet 0    hydrOXYzine (ATARAX) 25 MG tablet Take 1 tablet (25 mg total) by mouth 3 (three) times daily as needed for anxiety. 60 tablet 0    loperamide (IMODIUM A-D) 2 MG tablet Take 1 tablet (2 mg total) by mouth 4 (four) times daily as needed (take for increased ostomy outputs). (Patient not taking: Reported on 01/27/2022) 30 tablet 3    methocarbamol (ROBAXIN) 500 MG tablet Take 2 tablets (1,000 mg total) by mouth every 6 (six) hours as needed for muscle spasms. 60 tablet 0    methocarbamol (ROBAXIN) 500 MG tablet Take 1 tablet (500 mg total) by mouth 3 (three) times daily as needed for up to 10  days 30 tablet 0    ondansetron (ZOFRAN) 8 MG tablet Take 1 tablet (8 mg total) by mouth every 8 (eight) hours as needed for nausea or vomiting (May start using after day 3 for nausea and vomiting). 30 tablet 3    oxyCODONE (OXY IR/ROXICODONE) 5 MG immediate release tablet Take 1 tablet (5 mg total) by mouth every 6 (six) hours as needed for pain 30 tablet 0    prochlorperazine (COMPAZINE) 10 MG tablet Take 1 tablet (10 mg total) by mouth every 6 (six) hours as needed for nausea or vomiting. 30 tablet 2    No current facility-administered medications for this encounter.   ROS  Review of Systems  Gastrointestinal:        RLQ ileostomy with mucus fistula below stoma, close proximity to umbilicus  Musculoskeletal:        Gaining strength  Psychiatric/Behavioral: Negative.    All other systems reviewed and are negative.  Vital signs:  BP 125/73 (BP Location: Right Arm)   Pulse 65   Temp 98.2 F (36.8 C) (Oral)   Resp 18   SpO2 100%  Exam:  Physical Exam Vitals reviewed.  Constitutional:      Appearance: Normal appearance.  Abdominal:  Palpations: Abdomen is soft.     Comments: RLQ ileostomy  Skin:    General: Skin is warm and dry.     Findings: Erythema present.     Comments: Peristomal breakdown, improving    Neurological:     Mental Status: He is alert and oriented to person, place, and time.  Psychiatric:        Mood and Affect: Mood normal.        Behavior: Behavior normal.     Stoma type/location:  RLQ ileostomy Stomal assessment/size:  Due to oval shape, mucus fistula location, barrier opening cut larger, barrier ring around stom Peristomal assessment:  denuded skin at mucus fistula  stoma powder and skin prep  barrier ring Treatment options for stomal/peristomal skin: 2 piece pouch barrier ring, places barrier extenders on skin beneath pouch Output: liquid brown stool Ostomy pouching: 2pc.  Education provided:  wife performs pouch care at home.  Does an excellent  job. Patient able to change if needed.  We cut opening larger today and pattern sent home with patient Had enema today (no barium) anxious for ostomy reversal.     Impression/dx  Contact dermatitis ileostomy Discussion  See above   added barrier strip piece to umbilicus Plan  See back in 1-2 weeks    Visit time: 40 minutes.   Domenic Moras FNP-BC

## 2022-02-28 NOTE — Discharge Instructions (Signed)
Continue same pouch   Added barrier strip, small piece placed in defect in umbilicus to promote seal Samples given to patient

## 2022-03-03 ENCOUNTER — Ambulatory Visit: Payer: Self-pay | Admitting: General Surgery

## 2022-03-04 ENCOUNTER — Ambulatory Visit: Payer: Self-pay | Admitting: General Surgery

## 2022-03-05 DIAGNOSIS — Z932 Ileostomy status: Secondary | ICD-10-CM | POA: Diagnosis not present

## 2022-03-06 ENCOUNTER — Ambulatory Visit (HOSPITAL_COMMUNITY): Payer: Medicaid Other | Admitting: Nurse Practitioner

## 2022-03-06 ENCOUNTER — Other Ambulatory Visit (HOSPITAL_BASED_OUTPATIENT_CLINIC_OR_DEPARTMENT_OTHER): Payer: Self-pay

## 2022-03-07 ENCOUNTER — Observation Stay (HOSPITAL_COMMUNITY)
Admission: EM | Admit: 2022-03-07 | Discharge: 2022-03-08 | Disposition: A | Discharge status: Home / Self Care | Payer: Medicaid Other | Attending: Student in an Organized Health Care Education/Training Program | Admitting: Student in an Organized Health Care Education/Training Program

## 2022-03-07 ENCOUNTER — Encounter (HOSPITAL_COMMUNITY): Payer: Self-pay

## 2022-03-07 ENCOUNTER — Other Ambulatory Visit: Payer: Self-pay

## 2022-03-07 ENCOUNTER — Emergency Department (HOSPITAL_COMMUNITY): Payer: Medicaid Other

## 2022-03-07 DIAGNOSIS — K56609 Unspecified intestinal obstruction, unspecified as to partial versus complete obstruction: Secondary | ICD-10-CM | POA: Diagnosis not present

## 2022-03-07 DIAGNOSIS — C787 Secondary malignant neoplasm of liver and intrahepatic bile duct: Secondary | ICD-10-CM | POA: Diagnosis not present

## 2022-03-07 DIAGNOSIS — K9413 Enterostomy malfunction: Secondary | ICD-10-CM | POA: Diagnosis present

## 2022-03-07 DIAGNOSIS — K3189 Other diseases of stomach and duodenum: Secondary | ICD-10-CM | POA: Diagnosis not present

## 2022-03-07 DIAGNOSIS — F17210 Nicotine dependence, cigarettes, uncomplicated: Secondary | ICD-10-CM | POA: Insufficient documentation

## 2022-03-07 DIAGNOSIS — Z8719 Personal history of other diseases of the digestive system: Secondary | ICD-10-CM | POA: Diagnosis not present

## 2022-03-07 DIAGNOSIS — R1013 Epigastric pain: Secondary | ICD-10-CM | POA: Diagnosis present

## 2022-03-07 DIAGNOSIS — C20 Malignant neoplasm of rectum: Secondary | ICD-10-CM | POA: Insufficient documentation

## 2022-03-07 DIAGNOSIS — Z79899 Other long term (current) drug therapy: Secondary | ICD-10-CM | POA: Diagnosis not present

## 2022-03-07 DIAGNOSIS — K9419 Other complications of enterostomy: Secondary | ICD-10-CM | POA: Diagnosis present

## 2022-03-07 LAB — CBC WITH DIFFERENTIAL/PLATELET
Abs Immature Granulocytes: 0.02 10*3/uL (ref 0.00–0.07)
Basophils Absolute: 0 10*3/uL (ref 0.0–0.1)
Basophils Relative: 0 %
Eosinophils Absolute: 0.1 10*3/uL (ref 0.0–0.5)
Eosinophils Relative: 1 %
HCT: 40.6 % (ref 39.0–52.0)
Hemoglobin: 13.9 g/dL (ref 13.0–17.0)
Immature Granulocytes: 0 %
Lymphocytes Relative: 10 %
Lymphs Abs: 0.7 10*3/uL (ref 0.7–4.0)
MCH: 31.2 pg (ref 26.0–34.0)
MCHC: 34.2 g/dL (ref 30.0–36.0)
MCV: 91 fL (ref 80.0–100.0)
Monocytes Absolute: 0.4 10*3/uL (ref 0.1–1.0)
Monocytes Relative: 6 %
Neutro Abs: 5.3 10*3/uL (ref 1.7–7.7)
Neutrophils Relative %: 83 %
Platelets: 200 10*3/uL (ref 150–400)
RBC: 4.46 MIL/uL (ref 4.22–5.81)
RDW: 11.7 % (ref 11.5–15.5)
WBC: 6.4 10*3/uL (ref 4.0–10.5)
nRBC: 0 % (ref 0.0–0.2)

## 2022-03-07 LAB — COMPREHENSIVE METABOLIC PANEL
ALT: 11 U/L (ref 0–44)
AST: 25 U/L (ref 15–41)
Albumin: 4.2 g/dL (ref 3.5–5.0)
Alkaline Phosphatase: 76 U/L (ref 38–126)
Anion gap: 11 (ref 5–15)
BUN: <5 mg/dL — ABNORMAL LOW (ref 6–20)
CO2: 21 mmol/L — ABNORMAL LOW (ref 22–32)
Calcium: 9.1 mg/dL (ref 8.9–10.3)
Chloride: 101 mmol/L (ref 98–111)
Creatinine, Ser: 0.92 mg/dL (ref 0.61–1.24)
GFR, Estimated: >60 mL/min (ref >60)
Glucose, Bld: 129 mg/dL — ABNORMAL HIGH (ref 70–99)
Potassium: 3.4 mmol/L — ABNORMAL LOW (ref 3.5–5.1)
Sodium: 133 mmol/L — ABNORMAL LOW (ref 135–145)
Total Bilirubin: 0.5 mg/dL (ref 0.3–1.2)
Total Protein: 7.2 g/dL (ref 6.5–8.1)

## 2022-03-07 LAB — LIPASE, BLOOD: Lipase: 29 U/L (ref 11–51)

## 2022-03-07 LAB — MAGNESIUM: Magnesium: 1.8 mg/dL (ref 1.7–2.4)

## 2022-03-07 MED ORDER — MORPHINE SULFATE (PF) 4 MG/ML IV SOLN
4.0000 mg | Freq: Once | INTRAVENOUS | Status: AC
  Administered 2022-03-07: 4 mg via INTRAVENOUS
  Filled 2022-03-07: qty 1

## 2022-03-07 MED ORDER — CHLORHEXIDINE GLUCONATE CLOTH 2 % EX PADS
6.0000 | MEDICATED_PAD | Freq: Every day | CUTANEOUS | Status: DC
  Administered 2022-03-08: 6 via TOPICAL

## 2022-03-07 MED ORDER — ONDANSETRON HCL 4 MG/2ML IJ SOLN
4.0000 mg | Freq: Four times a day (QID) | INTRAMUSCULAR | Status: DC | PRN
  Administered 2022-03-07: 4 mg via INTRAVENOUS
  Filled 2022-03-07: qty 2

## 2022-03-07 MED ORDER — ONDANSETRON HCL 4 MG PO TABS: 4.0000 mg | ORAL_TABLET | Freq: Four times a day (QID) | ORAL | Status: DC | PRN

## 2022-03-07 MED ORDER — LACTATED RINGERS IV SOLN: INTRAVENOUS | Status: AC

## 2022-03-07 MED ORDER — PROCHLORPERAZINE EDISYLATE 10 MG/2ML IJ SOLN
10.0000 mg | Freq: Four times a day (QID) | INTRAMUSCULAR | Status: DC | PRN
  Administered 2022-03-07: 10 mg via INTRAVENOUS
  Filled 2022-03-07: qty 2

## 2022-03-07 MED ORDER — IOHEXOL 350 MG/ML SOLN
75.0000 mL | Freq: Once | INTRAVENOUS | Status: AC | PRN
  Administered 2022-03-07: 75 mL via INTRAVENOUS

## 2022-03-07 MED ORDER — POTASSIUM CHLORIDE 10 MEQ/100ML IV SOLN
10.0000 meq | INTRAVENOUS | Status: AC
  Administered 2022-03-07 (×4): 10 meq via INTRAVENOUS
  Filled 2022-03-07 (×4): qty 100

## 2022-03-07 MED ORDER — ONDANSETRON HCL 4 MG/2ML IJ SOLN
4.0000 mg | Freq: Once | INTRAMUSCULAR | Status: AC
  Administered 2022-03-07: 4 mg via INTRAVENOUS
  Filled 2022-03-07: qty 2

## 2022-03-07 MED ORDER — MORPHINE SULFATE (PF) 2 MG/ML IV SOLN
2.0000 mg | INTRAVENOUS | Status: DC | PRN
  Administered 2022-03-07: 2 mg via INTRAVENOUS
  Filled 2022-03-07: qty 1

## 2022-03-07 MED ORDER — MORPHINE SULFATE (PF) 2 MG/ML IV SOLN: 2.0000 mg | INTRAVENOUS | Status: DC | PRN

## 2022-03-07 MED ORDER — BISACODYL 10 MG RE SUPP
10.0000 mg | Freq: Once | RECTAL | Status: AC
  Administered 2022-03-07: 10 mg via RECTAL
  Filled 2022-03-07: qty 1

## 2022-03-07 NOTE — ED Provider Notes (Signed)
[facility] Provider Note   CSN: [id number] Arrival date & time: 03/07/22  0243     History  Chief Complaint  Patient presents with   Abdominal Pain    Masoud Nyce is a 37 y.o. male.  The history is provided by the patient and medical records. No language interpreter was used.  Abdominal Pain    38 year old male with significant history of rectal cancer metastasized to liver, status post ileostomy presenting to the ED with complaint of abdominal pain.  Patient reports acute onset of pain to his mid abdomen that started an hour ago while he was sitting and watching TV.  Pain is described as sharp, crampy, with associated nausea and vomiting.  States "I feel like I am having a blockage".  States he now has an SBO in the past.  Denies any fever or chills no chest pain or shortness of breath no urinary symptoms.  He is able to pass flatus, last bowel movement was a few hours ago.  Home Medications Prior to Admission medications   Medication Sig Start Date End Date Taking? Authorizing Provider  acetaminophen (TYLENOL) 500 MG tablet Take 500-1,000 mg by mouth every 6 (six) hours as needed for mild pain or moderate pain.    [provider]  albuterol (VENTOLIN HFA) 108 (90 Base) MCG/ACT inhaler Inhale 1-2 puffs into the lungs every 6 (six) hours as needed for wheezing or shortness of breath.    [provider]  famotidine (PEPCID) 20 MG tablet Take 20 mg by mouth 2 (two) times daily as needed for heartburn or indigestion.    [provider]  gabapentin (NEURONTIN) 600 MG tablet Take 1 tablet (600 mg total) by mouth 3 (three) times daily. Patient not taking: Reported on 01/27/2022 08/21/21   Ladell Pier, MD  hydrOXYzine (ATARAX) 25 MG tablet Take 1 tablet (25 mg total) by mouth 3 (three) times daily as needed for anxiety. 02/05/22   Ladell Pier, MD  loperamide (IMODIUM A-D) 2 MG tablet Take 1 tablet (2 mg total)  by mouth 4 (four) times daily as needed (take for increased ostomy outputs). Patient not taking: Reported on 01/27/2022 08/67/61   Leighton Ruff, MD  methocarbamol (ROBAXIN) 500 MG tablet Take 2 tablets (1,000 mg total) by mouth every 6 (six) hours as needed for muscle spasms. 08/21/21   Ladell Pier, MD  methocarbamol (ROBAXIN) 500 MG tablet Take 1 tablet (500 mg total) by mouth 3 (three) times daily as needed for up to 10 days 02/06/22     ondansetron (ZOFRAN) 8 MG tablet Take 1 tablet (8 mg total) by mouth every 8 (eight) hours as needed for nausea or vomiting (May start using after day 3 for nausea and vomiting). 09/10/21   Ladell Pier, MD  oxyCODONE (OXY IR/ROXICODONE) 5 MG immediate release tablet Take 1 tablet (5 mg total) by mouth every 6 (six) hours as needed for pain 02/05/22     prochlorperazine (COMPAZINE) 10 MG tablet Take 1 tablet (10 mg total) by mouth every 6 (six) hours as needed for nausea or vomiting. 04/25/21   Ladell Pier, MD      Allergies    Crab extract allergy skin test and Dilaudid [hydromorphone]    Review of Systems   Review of Systems  Gastrointestinal:  Positive for abdominal pain.  All other systems reviewed and are negative.   Physical Exam Updated Vital Signs BP 123/76   Pulse 74  Temp 97.9 F (36.6 C) (Oral)   Resp 19   Ht '5\' 9"'$  (1.753 m)   Wt 68 kg   SpO2 100%   BMI 22.15 kg/m  Physical Exam Vitals and nursing note reviewed.  Constitutional:      Appearance: He is well-developed.     Comments: Appears uncomfortable, holding the emesis bag  HENT:     Head: Atraumatic.  Eyes:     Conjunctiva/sclera: Conjunctivae normal.  Cardiovascular:     Rate and Rhythm: Normal rate and regular rhythm.     Heart sounds: Normal heart sounds.  Pulmonary:     Effort: Pulmonary effort is normal.     Breath sounds: Normal breath sounds.  Abdominal:     General: Bowel sounds are decreased.     Palpations: Abdomen is rigid.     Tenderness: There is  generalized abdominal tenderness. There is guarding and rebound.  Musculoskeletal:     Cervical back: Neck supple.  Skin:    Findings: No rash.  Neurological:     Mental Status: He is alert.     ED Results / Procedures / Treatments   Labs (all labs ordered are listed, but only abnormal results are displayed) Labs Reviewed  CBC WITH DIFFERENTIAL/PLATELET  COMPREHENSIVE METABOLIC PANEL  LIPASE, BLOOD  URINALYSIS, ROUTINE W REFLEX MICROSCOPIC    EKG None  Radiology No results found.  Procedures Procedures    Medications Ordered in ED Medications - No data to display  ED Course/ Medical Decision Making/ A&P                             Medical Decision Making Amount and/or Complexity of Data Reviewed Labs: ordered. Radiology: ordered.  Risk Prescription drug management.   BP 123/76   Pulse 74   Temp 97.9 F (36.6 C) (Oral)   Resp 19   Ht '5\' 9"'$  (1.753 m)   Wt 68 kg   SpO2 100%   BMI 22.15 kg/m   63:21 AM 37 year old male with significant history of rectal cancer metastasized to liver, status post ileostomy presenting to the ED with complaint of abdominal pain.  Patient reports acute onset of pain to his mid abdomen that started an hour ago while he was sitting and watching TV.  Pain is described as sharp, crampy, with associated nausea and vomiting.  States "I feel like I am having a blockage".  States he now has an SBO in the past.  Denies any fever or chills no chest pain or shortness of breath no urinary symptoms.  He is able to pass flatus, last bowel movement was a few hours ago.  On exam, patient is laying in the bed holding an emesis bag appears uncomfortable.  Heart with normal rate and rhythm, lungs are clear to auscultation bilaterally abdomen is tense, diffusely tender with guarding and rebound tenderness.  Ostomy bag in place normal stoma.  EMR reviewed, patient previously had rectal bleeding and abdominal pain with CT scan that shows rectal wall  thickening and indeterminate liver lesion.  Patient had a flexible sigmoidoscopy and a mass was noted approximately 4 cm from the anal verge and circumferential.  Patient subsequently had an MRI of his pelvis on 3/16  which shows cancer with liver lesion consistent with metastatic disease. Patient was seen by his surgeon on 1/31 for follow-up after underwent robotic LAR and DLI on 01/15/22 for rectal cancer.  He is scheduled to have a  takedown of his diverting loop ileostomy  Lower anterior resection and diverting ileostomy  -Labs ordered, independently viewed and interpreted by me.  Labs remarkable for normal WBC, normal H&H, electrolytes are overall reassuring -The patient was maintained on a cardiac monitor.  I personally viewed and interpreted the cardiac monitored which showed an underlying rhythm of: NSR -Imaging independently viewed and interpreted by me and I agree with radiologist's interpretation.  Result remarkable for abd/pelvis CT showing  -This patient presents to the ED for concern of abd pain, this involves an extensive number of treatment options, and is a complaint that carries with it a high risk of complications and morbidity.  The differential diagnosis includes SBO, bowel perforation, appendicitis, colitis, diverticular disease, cholecystitis, CA pain -Co morbidities that complicate the patient evaluation includes rectal cancer -Treatment includes morphine, zofran -Reevaluation of the patient after these medicines showed that the patient improved -PCP office notes or outside notes reviewed -Discussion with oncoming provider who will consult surgery for management of abd pain and signs of stricture.  Anticipate hospital admission         Final Clinical Impression(s) / ED Diagnoses Final diagnoses:  None    Rx / DC Orders ED Discharge Orders     None         Domenic Moras, PA-C 03/07/22 [id number]    Palumbo, April, MD 03/07/22 2585

## 2022-03-07 NOTE — Progress Notes (Signed)
Subjective: Patient known to [facility], Dr. Marcello Moores, and the general surgery service due to a recent admission at [facility] several weeks ago.  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 Zenia Resides 08/04/21) as well as robotic LAR and diverting loop ileostomy by Dr. Marcello Moores on 01/15/22.  He returned to the ED in early January due to concerns for issues with his stoma.  It was noted to be slightly prolapsed and was reduced.  It was noted to be patent at this time.  He has not been having issues since that time until last night after eating dinner he began having abdominal pain, nausea, vomiting.  He has put very little out of his bag since then.  Due to persistent symptoms, he presented to [facility] for evaluation.  His labs are relatively stable, and his CT scan reveals mildly distended small bowel loops immediately proximal to his ileostomy with fecalization of contents. Features are concerning for stricture at the level of his ileostomy.  We have been asked to see him for further recommendations.  ROS: See above, otherwise other systems negative  Objective: Vital signs in last 24 hours: Temp:  [97.8 F (36.6 C)-97.9 F (36.6 C)] 97.9 F (36.6 C) (02/03 0255) Pulse Rate:  [66-83] 67 (02/03 0845) Resp:  [14-29] 16 (02/03 0845) BP: (113-130)/(66-83) 126/83 (02/03 0845) SpO2:  [97 %-100 %] 100 % (02/03 0845) Weight:  [68 kg] 68 kg (02/03 0256)    Intake/Output from previous day: No intake/output data recorded. Intake/Output this shift: No intake/output data recorded.  PE: Gen: appears to not feel well and nauseated Heart: regular, palpable radial pulses Lungs: respiratory effort nonlabored Abd: soft, pouch removed.  Stoma is injected and hyperemic.  Fluid noted in pouch is thin, bloody fluid.  Afferent limb is most pronounced with a small prolapse, but not significant.  He has significant narrowing.  I can very slowly progress  my 5th digit about 2cm in.  I am able to feel the abdominal wall defect I believe although the whole thing is very tight.  I can feel at the tip of my finger hard stool present. Psych: A&Ox3  Lab Results:  Recent Labs    03/07/22 0320  WBC 6.4  HGB 13.9  HCT 40.6  PLT 200   BMET Recent Labs    03/07/22 0320  NA 133*  K 3.4*  CL 101  CO2 21*  GLUCOSE 129*  BUN <5*  CREATININE 0.92  CALCIUM 9.1   PT/INR No results for input(s): "LABPROT", "INR" in the last 72 hours. CMP     Component Value Date/Time   NA 133 (L) 03/07/2022 0320   K 3.4 (L) 03/07/2022 0320   CL 101 03/07/2022 0320   CO2 21 (L) 03/07/2022 0320   GLUCOSE 129 (H) 03/07/2022 0320   BUN <5 (L) 03/07/2022 0320   CREATININE 0.92 03/07/2022 0320   CREATININE 1.02 12/05/2021 0810   CALCIUM 9.1 03/07/2022 0320   PROT 7.2 03/07/2022 0320   ALBUMIN 4.2 03/07/2022 0320   AST 25 03/07/2022 0320   AST 14 (L) 12/05/2021 0810   ALT 11 03/07/2022 0320   ALT 9 12/05/2021 0810   ALKPHOS 76 03/07/2022 0320   BILITOT 0.5 03/07/2022 0320   BILITOT 0.8 12/05/2021 0810   GFRNONAA >60 03/07/2022 0320   GFRNONAA >60 12/05/2021 0810   Lipase     Component Value Date/Time   LIPASE  29 03/07/2022 0320       Studies/Results: CT ABDOMEN PELVIS W CONTRAST  Result Date: 03/07/2022 CLINICAL DATA:  Abdominal pain. History of rectal cancer. * Tracking Code: BO * EXAM: CT ABDOMEN AND PELVIS WITH CONTRAST TECHNIQUE: Multidetector CT imaging of the abdomen and pelvis was performed using the standard protocol following bolus administration of intravenous contrast. RADIATION DOSE REDUCTION: This exam was performed according to the departmental dose-optimization program which includes automated exposure control, adjustment of the mA and/or kV according to patient size and/or use of iterative reconstruction technique. CONTRAST:  57m OMNIPAQUE IOHEXOL 350 MG/ML SOLN COMPARISON:  02/04/2022 FINDINGS: Lower chest: Unremarkable.  Hepatobiliary: 3 discrete areas of surgical change in the liver compatible with prior wedge resection. No new suspicious liver lesion on today's study. There is no evidence for gallstones, gallbladder wall thickening, or pericholecystic fluid. No intrahepatic or extrahepatic biliary dilation. Pancreas: No focal mass lesion. No dilatation of the main duct. No intraparenchymal cyst. No peripancreatic edema. Spleen: No splenomegaly. No focal mass lesion. Adrenals/Urinary Tract: No adrenal nodule or mass. Kidneys unremarkable. No evidence for hydroureter. The urinary bladder appears normal for the degree of distention. Stomach/Bowel: Stomach is moderately distended with fluid. Duodenum is normally positioned as is the ligament of Treitz. There are some mildly distended small bowel loops in the mid lower abdomen and upper pelvis measuring up to 2.5 cm diameter. This represents small bowel immediately proximal to the patient's ileostomy and fecalization of contents in the small bowel loops is consistent with decreased small bowel transit time. There is subtle ill definition in the wall of these bowel loops with subtle associated perienteric edema. Features may be related to an apparent stricture at the level of the ileostomy as it passes through the rectus sheath (see image 44 of series 3). Terminal ileum and appendix are unremarkable. Colon is opacified compatible with retained contrast material from barium enema of 02/27/2022. Postsurgical changes in the pelvis compatible with previous low anterior resection. Stable appearance of presacral and perirectal edema. Vascular/Lymphatic: No abdominal aortic aneurysm. No abdominal aortic atherosclerotic calcification. There is no gastrohepatic or hepatoduodenal ligament lymphadenopathy. No retroperitoneal or mesenteric lymphadenopathy. No pelvic sidewall lymphadenopathy. Reproductive: The prostate gland and seminal vesicles are unremarkable. Other: Trace intraperitoneal free  fluid noted in the pelvis. Musculoskeletal: No worrisome lytic or sclerotic osseous abnormality. IMPRESSION: 1. Mildly distended small bowel loops in the mid lower abdomen and upper pelvis measuring up to 2.5 cm diameter. This represents small bowel immediately proximal to the patient's ileostomy and fecalization of contents in these small bowel loops is consistent with decreased small bowel transit time. There is subtle ill definition in the wall of these bowel loops with subtle associated perienteric edema. Features may be related to an apparent stricture at the level of the ileostomy as it passes through the rectus sheath. 2. Trace intraperitoneal free fluid in the pelvis. 3. Postsurgical changes in the pelvis compatible with previous low anterior resection. Electronically Signed   By: EMisty StanleyM.D.   On: 03/07/2022 06:11   DG ABD ACUTE 2+V W 1V CHEST  Result Date: 03/07/2022 CLINICAL DATA:  Small-bowel obstruction EXAM: DG ABDOMEN ACUTE WITH 1 VIEW CHEST COMPARISON:  05/02/2021, CT 02/04/2022 FINDINGS: Lungs are clear. No pneumothorax or pleural effusion. Cardiac size within normal limits. Right internal jugular chest port tip seen at the superior cavoatrial junction. Pulmonary vascularity is normal. Barium contrast noted within a decompressed colon. Normal abdominal gas pattern. Ostomy appliance noted within the right lower quadrant.  No free intraperitoneal gas. Surgical clips are seen in the right upper quadrant. No organomegaly. No acute bone abnormality. IMPRESSION: 1. Nonobstructive bowel gas pattern. Barium contrast noted within a decompressed colon. Electronically Signed   By: Fidela Salisbury M.D.   On: 03/07/2022 03:37    Anti-infectives: Anti-infectives (From admission, onward)    None        Assessment/Plan Stage IV rectal adenocarcinoma s/p LAR and diverting loop ileostomy by Dr. 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   -d/w Dr. Dema [name] about how to proceed with possible dilatations.  This is pretty tight and unclear how dilated we may be able to get this. -in the process of getting a takedown set up with Dr. Marcello Moores in the near future. -place NGT for obstructive relief. -cont to follow patient   FEN - NPO/NGT/IVFs VTE - ok for chemical prophylaxis from our standpoint ID - none currently needed  I reviewed ED provider notes, hospitalist notes, last 24 h vitals and pain scores, last 48 h intake and output, last 24 h labs and trends, and last 24 h imaging results.   LOS: 0 days    Henreitta Cea , [facility] 03/07/2022, 9:29 AM Please see Amion for pager number during day hours 7:00am-4:30pm or 7:00am -11:30am on weekends

## 2022-03-07 NOTE — ED Notes (Signed)
Attempted NG tube - unsuccessful. Pt stated he'd rather be nauseous than having that thing (NG tube ). MD made aware via secure chat.

## 2022-03-07 NOTE — ED Provider Notes (Signed)
I received this patient in handoff from previous PA, Domenic Moras.  Please see his note for original workup and history of this plan.  In short patient is a 37 year old male with a past medical history of stage IV rectal cancer who presented today with acute onset abdominal pain.  Of note, patient had a diverting ileostomy on January 15, 2022.  He lost cell General surgery 1/3 for mild stoma prolapse.  He was cleared by general surgery and followed up with wound care.  He last saw wound care a week ago and they had no concerns about his ostomy site.  Patient CT scan is showing a stricture around the patient's ostomy site.   MPRESSION: 1. Mildly distended small bowel loops in the mid lower abdomen and upper pelvis measuring up to 2.5 cm diameter. This represents small bowel immediately proximal to the patient's ileostomy and fecalization of contents in these small bowel loops is consistent with decreased small bowel transit time. There is subtle ill definition in the wall of these bowel loops with subtle associated perienteric edema. Features may be related to an apparent stricture at the level of the ileostomy as it passes through the rectus sheath.    At this time overnight provider has a call out to general surgery.  Plan is for me to take this call and ultimately admit the patient to the hospital for his abdominal pain and surgical assessment. Physical Exam  BP 120/72   Pulse 69   Temp 97.9 F (36.6 C) (Oral)   Resp 14   Ht '5\' 9"'$  (1.753 m)   Wt 68 kg   SpO2 100%   BMI 22.15 kg/m   Physical Exam  Procedures  Procedures  ED Course / MDM    Medical Decision Making Amount and/or Complexity of Data Reviewed Labs: ordered. Radiology: ordered.  Risk Prescription drug management. Decision regarding hospitalization.   I spoke with the PA with general surgery, Claiborne Billings, who says that general surgery will see the patient.  Admit to internal medicine teaching service.     Rhae Hammock, PA-C 03/07/22 0347    Pattricia Boss, MD 03/08/22 [phone]

## 2022-03-07 NOTE — ED Notes (Signed)
ED TO INPATIENT HANDOFF REPORT  ED Nurse Name and Phone #: Luetta Nutting [phone]  S Name/Age/Gender Jeremiah Allen 37 y.o. male Room/Bed: 003C/003C  Code Status   Code Status: Full Code  Home/SNF/Other Home Patient oriented to: self, place, time, and situation Is this baseline? Yes   Triage Complete: Triage complete  Chief Complaint Ileostomy stenosis ([place]) [K94.13]  Triage Note Pt reports N/V. "It feels like I have a blockage"    Allergies Allergies  Allergen Reactions   Crab Extract Allergy Skin Test Anaphylaxis and Swelling    Throat swelling (CRAB ALLERGY ONLY)   Dilaudid [Hydromorphone] Itching and Other (See Comments)    Skin tingling sensations     Level of Care/Admitting Diagnosis ED Disposition     ED Disposition  Admit   Condition  --   [facility]: [place] [[id number]]  Level of Care: Med-Surg [16]  May place patient in observation at [facility] or [place] if equivalent level of care is available:: No  Covid Evaluation: Asymptomatic - no recent exposure (last 10 days) testing not required  Diagnosis: Ileostomy stenosis [facility]) [[id number]]  Admitting Physician: Axel Filler [[id number]]  Attending Physician: Axel Filler [phone]          B Medical/Surgery History Past Medical History:  Diagnosis Date   Anxiety    Bipolar disorder ([place])    Cancer ([place])    Rectal   GERD (gastroesophageal reflux disease)    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   Past Surgical History:  Procedure Laterality Date   DIVERTING ILEOSTOMY N/A 01/15/2022   Procedure: DIVERTING ILEOSTOMY;  Surgeon: Leighton Ruff, MD;  Location: [facility];  Service: General;  Laterality: N/A;   FLEXIBLE SIGMOIDOSCOPY N/A 04/16/2021   Procedure: FLEXIBLE SIGMOIDOSCOPY;  Surgeon: Toledo, Benay Pike, MD;  Location: [facility] ENDOSCOPY;  Service: Gastroenterology;  Laterality: N/A;   IR IMAGING GUIDED PORT INSERTION  04/28/2021   LAPAROSCOPY N/A  08/04/2021   Procedure: STAGING LAPAROSCOPY WITH INTRAOPERATIVE ULTRASOUND;  Surgeon: Dwan Bolt, MD;  Location: [place];  Service: General;  Laterality: N/A;   OPEN PARTIAL HEPATECTOMY  N/A 08/04/2021   Procedure: OPEN PARTIAL HEPATECTOMY;  Surgeon: Dwan Bolt, MD;  Location: [place];  Service: General;  Laterality: N/A;   WISDOM TOOTH EXTRACTION     XI ROBOTIC ASSISTED LOWER ANTERIOR RESECTION N/A 01/15/2022   Procedure: XI ROBOTIC ASSISTED LOWER ANTERIOR RESECTION;  Surgeon: Leighton Ruff, MD;  Location: [facility];  Service: General;  Laterality: N/A;     A IV Location/Drains/Wounds Patient Lines/Drains/Airways Status     Active Line/Drains/Airways     Name Placement date Placement time Site Days   Implanted Port 04/28/21 Right Chest 04/28/21  0956  Chest  313   Ileostomy RLQ 01/15/22  1000  RLQ  51   Incision - 4 Ports Abdomen 1: Upper;Left 2: Upper;Mid 3: Right;Upper 4: Right;Lower 01/15/22  --  -- 51            Intake/Output Last 24 hours No intake or output data in the 24 hours ending 03/07/22 1206  Labs/Imaging Results for orders placed or performed during the hospital encounter of 03/07/22 (from the past 48 hour(s))  CBC with Differential     Status: None   Collection Time: 03/07/22  3:20 AM  Result Value Ref Range   WBC 6.4 4.0 - 10.5 K/uL   RBC 4.46 4.22 - 5.81 MIL/uL   Hemoglobin 13.9 13.0 - 17.0 g/dL  HCT 40.6 39.0 - 52.0 %   MCV 91.0 80.0 - 100.0 fL   MCH 31.2 26.0 - 34.0 pg   MCHC 34.2 30.0 - 36.0 g/dL   RDW 11.7 11.5 - 15.5 %   Platelets 200 150 - 400 K/uL   nRBC 0.0 0.0 - 0.2 %   Neutrophils Relative % 83 %   Neutro Abs 5.3 1.7 - 7.7 K/uL   Lymphocytes Relative 10 %   Lymphs Abs 0.7 0.7 - 4.0 K/uL   Monocytes Relative 6 %   Monocytes Absolute 0.4 0.1 - 1.0 K/uL   Eosinophils Relative 1 %   Eosinophils Absolute 0.1 0.0 - 0.5 K/uL   Basophils Relative 0 %   Basophils Absolute 0.0 0.0 - 0.1 K/uL   Immature Granulocytes 0 %   Abs Immature  Granulocytes 0.02 0.00 - 0.07 K/uL    Comment: Performed at [facility] [address] [place], [place] [address]  Comprehensive metabolic panel     Status: Abnormal   Collection Time: 03/07/22  3:20 AM  Result Value Ref Range   Sodium 133 (L) 135 - 145 mmol/L   Potassium 3.4 (L) 3.5 - 5.1 mmol/L   Chloride 101 98 - 111 mmol/L   CO2 21 (L) 22 - 32 mmol/L   Glucose, Bld 129 (H) 70 - 99 mg/dL    Comment: Glucose reference range applies only to samples taken after fasting for at least 8 hours.   BUN <5 (L) 6 - 20 mg/dL   Creatinine, Ser 0.92 0.61 - 1.24 mg/dL   Calcium 9.1 8.9 - 10.3 mg/dL   Total Protein 7.2 6.5 - 8.1 g/dL   Albumin 4.2 3.5 - 5.0 g/dL   AST 25 15 - 41 U/L   ALT 11 0 - 44 U/L   Alkaline Phosphatase 76 38 - 126 U/L   Total Bilirubin 0.5 0.3 - 1.2 mg/dL   GFR, Estimated >60 >60 mL/min    Comment: (NOTE) Calculated using the CKD-EPI Creatinine Equation (2021)    Anion gap 11 5 - 15    Comment: Performed at [facility] [address], [place], [place] [address]  Lipase, blood     Status: None   Collection Time: 03/07/22  3:20 AM  Result Value Ref Range   Lipase 29 11 - 51 U/L    Comment: Performed at [facility] [address] [place], [place] [address]   CT ABDOMEN PELVIS W CONTRAST  Result Date: 03/07/2022 CLINICAL DATA:  Abdominal pain. History of rectal cancer. * Tracking Code: BO * EXAM: CT ABDOMEN AND PELVIS WITH CONTRAST TECHNIQUE: Multidetector CT imaging of the abdomen and pelvis was performed using the standard protocol following bolus administration of intravenous contrast. RADIATION DOSE REDUCTION: This exam was performed according to the departmental dose-optimization program which includes automated exposure control, adjustment of the mA and/or kV according to patient size and/or use of iterative reconstruction technique. CONTRAST:  76m OMNIPAQUE IOHEXOL 350 MG/ML SOLN COMPARISON:  02/04/2022 FINDINGS: Lower chest: Unremarkable.  Hepatobiliary: 3 discrete areas of surgical change in the liver compatible with prior wedge resection. No new suspicious liver lesion on today's study. There is no evidence for gallstones, gallbladder wall thickening, or pericholecystic fluid. No intrahepatic or extrahepatic biliary dilation. Pancreas: No focal mass lesion. No dilatation of the main duct. No intraparenchymal cyst. No peripancreatic edema. Spleen: No splenomegaly. No focal mass lesion. Adrenals/Urinary Tract: No adrenal nodule or mass. Kidneys unremarkable. No evidence for  hydroureter. The urinary bladder appears normal for the degree of distention. Stomach/Bowel: Stomach is moderately distended with fluid. Duodenum is normally positioned as is the ligament of Treitz. There are some mildly distended small bowel loops in the mid lower abdomen and upper pelvis measuring up to 2.5 cm diameter. This represents small bowel immediately proximal to the patient's ileostomy and fecalization of contents in the small bowel loops is consistent with decreased small bowel transit time. There is subtle ill definition in the wall of these bowel loops with subtle associated perienteric edema. Features may be related to an apparent stricture at the level of the ileostomy as it passes through the rectus sheath (see image 44 of series 3). Terminal ileum and appendix are unremarkable. Colon is opacified compatible with retained contrast material from barium enema of 02/27/2022. Postsurgical changes in the pelvis compatible with previous low anterior resection. Stable appearance of presacral and perirectal edema. Vascular/Lymphatic: No abdominal aortic aneurysm. No abdominal aortic atherosclerotic calcification. There is no gastrohepatic or hepatoduodenal ligament lymphadenopathy. No retroperitoneal or mesenteric lymphadenopathy. No pelvic sidewall lymphadenopathy. Reproductive: The prostate gland and seminal vesicles are unremarkable. Other: Trace intraperitoneal free  fluid noted in the pelvis. Musculoskeletal: No worrisome lytic or sclerotic osseous abnormality. IMPRESSION: 1. Mildly distended small bowel loops in the mid lower abdomen and upper pelvis measuring up to 2.5 cm diameter. This represents small bowel immediately proximal to the patient's ileostomy and fecalization of contents in these small bowel loops is consistent with decreased small bowel transit time. There is subtle ill definition in the wall of these bowel loops with subtle associated perienteric edema. Features may be related to an apparent stricture at the level of the ileostomy as it passes through the rectus sheath. 2. Trace intraperitoneal free fluid in the pelvis. 3. Postsurgical changes in the pelvis compatible with previous low anterior resection. Electronically Signed   By: Misty Stanley M.D.   On: 03/07/2022 06:11   DG ABD ACUTE 2+V W 1V CHEST  Result Date: 03/07/2022 CLINICAL DATA:  Small-bowel obstruction EXAM: DG ABDOMEN ACUTE WITH 1 VIEW CHEST COMPARISON:  05/02/2021, CT 02/04/2022 FINDINGS: Lungs are clear. No pneumothorax or pleural effusion. Cardiac size within normal limits. Right internal jugular chest port tip seen at the superior cavoatrial junction. Pulmonary vascularity is normal. Barium contrast noted within a decompressed colon. Normal abdominal gas pattern. Ostomy appliance noted within the right lower quadrant. No free intraperitoneal gas. Surgical clips are seen in the right upper quadrant. No organomegaly. No acute bone abnormality. IMPRESSION: 1. Nonobstructive bowel gas pattern. Barium contrast noted within a decompressed colon. Electronically Signed   By: Fidela Salisbury M.D.   On: 03/07/2022 03:37    Pending Labs Unresulted Labs (From admission, onward)     Start     Ordered   03/07/22 0818  Magnesium  Add-on,   AD        03/07/22 0817            Vitals/Pain Today's Vitals   03/07/22 1045 03/07/22 1100 03/07/22 1150 03/07/22 1150  BP: 119/72 114/64     Pulse: 97 83    Resp: 12 14    Temp:    98.1 F (36.7 C)  TempSrc:      SpO2: 92% 99%    Weight:      Height:      PainSc:   2      Isolation Precautions No active isolations  Medications Medications  ondansetron (ZOFRAN) tablet 4 mg ( Oral  See Alternative 03/07/22 0851)    Or  ondansetron [facility]) injection 4 mg (4 mg Intravenous Given 03/07/22 0851)  potassium chloride 10 mEq in 100 mL IVPB (10 mEq Intravenous New Bag/Given 03/07/22 1147)  morphine (PF) 2 MG/ML injection 2-4 mg (has no administration in time range)  prochlorperazine (COMPAZINE) injection 10 mg (10 mg Intravenous Given 03/07/22 1000)  lactated ringers infusion ( Intravenous New Bag/Given 03/07/22 1149)  morphine (PF) 4 MG/ML injection 4 mg (4 mg Intravenous Given 03/07/22 0318)  ondansetron (ZOFRAN) injection 4 mg (4 mg Intravenous Given 03/07/22 0317)  iohexol (OMNIPAQUE) 350 MG/ML injection 75 mL (75 mLs Intravenous Contrast Given 03/07/22 0546)  morphine (PF) 4 MG/ML injection 4 mg (4 mg Intravenous Given 03/07/22 0557)  bisacodyl (DULCOLAX) suppository 10 mg (10 mg Rectal Given 03/07/22 4862)    Mobility walks     Focused Assessments    R Recommendations: See Admitting Provider Note  Report given to:   Additional Notes:

## 2022-03-07 NOTE — ED Notes (Signed)
Pt refused NG insertion again. MD made aware.

## 2022-03-07 NOTE — H&P (Addendum)
Date: 03/07/2022               Patient Name:  Jeremiah Allen MRN: [id number]  DOB: 07-Aug-1985 Age / Sex: 38 y.o., male   PCP: Hayden Rasmussen, MD         Medical Service: Internal Medicine Teaching Service         Attending Physician: Dr. Evette Doffing, Mallie Mussel, *     First Contact: Dr. Angelique Blonder, DO Pager [phone]    Second Contact: Dr. 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 He was feeling well yesterday until 1.5 hours after dinner he began having sharp abdominal pain around his ileostomy site. He began vomiting up whole food, this would partially relieve the pain and it would immediatly return. At this time the pain is more of a pressure and is a 7/10. Denies radiation of the the pain. Since this started he has had no stool output into his ileostomy, but has passed gas. He has not no prior complications of his ileostomy since having it placed. They note ileostomy reversal is planned for the coming months.  Denies recent illness, fevers, shortness of breath. Has not had anything by mouth since this started.   ED Course: CT abdomen and pelvis with contrast revealed distended small bowel loops proximal to the ileostomy with concern for stricture. General surgery consulted and awaiting evaluation by them. Recommended medical admission until then.   Meds:  Hydroxyzine TID PRN  Past Medical History Stage IV adenocarcinoma of the rectum Encephalitis of unknown etiology   Past Surgical  History:  Procedure Laterality Date   DIVERTING ILEOSTOMY N/A 01/15/2022   Procedure: DIVERTING ILEOSTOMY;  Surgeon: Leighton Ruff, MD;  Location: [facility];  Service: General;  Laterality: N/A;   FLEXIBLE SIGMOIDOSCOPY N/A 04/16/2021   Procedure: FLEXIBLE SIGMOIDOSCOPY;  Surgeon: Toledo, Benay Pike, MD;  Location: [facility];  Service: Gastroenterology;  Laterality: N/A;   IR IMAGING GUIDED PORT INSERTION  04/28/2021   LAPAROSCOPY N/A 08/04/2021   Procedure: STAGING LAPAROSCOPY WITH INTRAOPERATIVE ULTRASOUND;  Surgeon: Dwan Bolt, MD;  Location: [facility];  Service: General;  Laterality: N/A;   OPEN PARTIAL HEPATECTOMY  N/A 08/04/2021   Procedure: OPEN PARTIAL HEPATECTOMY;  Surgeon: Dwan Bolt, MD;  Location: [facility];  Service: General;  Laterality: N/A;   WISDOM TOOTH EXTRACTION     XI ROBOTIC ASSISTED LOWER ANTERIOR RESECTION N/A 01/15/2022   Procedure: XI ROBOTIC ASSISTED LOWER ANTERIOR RESECTION;  Surgeon: Leighton Ruff, MD;  Location: [facility];  Service: General;  Laterality: N/A;   Social:  Lives With:Spouse Occupation: Not currently employed Support: Family in the area Level of Function: Able to perform ADL/IADL PCP: Dr. Darron Doom Substances: 5 pack year smoking history, smokes 1/2 PPD of cigarettes. Denies alcohol use. Occasional marijuana use  Family History  Problem Relation Age of Onset   Heart disease Mother    Miscarriages / Stillbirths Sister    Asthma Sister    Stroke Sister  at birth   Developmental delay Brother    Allergies: Allergies as of 03/07/2022 - Review Complete 03/07/2022  Allergen Reaction Noted   Crab extract allergy skin test Anaphylaxis and Swelling 07/30/2021   Dilaudid [hydromorphone] Itching and Other (See Comments) 04/23/2021   Review of Systems: A complete ROS was negative except as per HPI.   OBJECTIVE:   Physical Exam: Blood pressure 116/72, pulse 70, temperature 97.9 F (36.6 C), temperature source Oral, resp. rate 17, height  '5\' 9"'$  (1.753 m), weight 68 kg, SpO2 98 %.  Constitutional: mild distress HENT: normocephalic atraumatic Eyes: conjunctiva non-erythematous Neck: supple Cardiovascular: regular rate and rhythm, no m/r/g Pulmonary/Chest: normal work of breathing on room air, lungs clear to auscultation bilaterally Abdominal: soft, non-distended. Ileostomy in place. Diffuse lower abdominal tenderness, worse medial aspect of ileostomy. Leaking around ileostomy. No erythema around this site.  MSK: normal bulk and tone Neurological: alert & oriented x 3 Skin: warm and dry. Right IJ port-a-cath without erythema or tenderness. Well healed abdominal surgical scars Psych: normal mood  Labs: CBC    Component Value Date/Time   WBC 6.4 03/07/2022 0320   RBC 4.46 03/07/2022 0320   HGB 13.9 03/07/2022 0320   HGB 13.9 12/29/2021 1319   HCT 40.6 03/07/2022 0320   PLT 200 03/07/2022 0320   PLT 171 12/29/2021 1319   MCV 91.0 03/07/2022 0320   MCH 31.2 03/07/2022 0320   MCHC 34.2 03/07/2022 0320   RDW 11.7 03/07/2022 0320   LYMPHSABS 0.7 03/07/2022 0320   MONOABS 0.4 03/07/2022 0320   EOSABS 0.1 03/07/2022 0320   BASOSABS 0.0 03/07/2022 0320     CMP     Component Value Date/Time   NA 133 (L) 03/07/2022 0320   K 3.4 (L) 03/07/2022 0320   CL 101 03/07/2022 0320   CO2 21 (L) 03/07/2022 0320   GLUCOSE 129 (H) 03/07/2022 0320   BUN <5 (L) 03/07/2022 0320   CREATININE 0.92 03/07/2022 0320   CREATININE 1.02 12/05/2021 0810   CALCIUM 9.1 03/07/2022 0320   PROT 7.2 03/07/2022 0320   ALBUMIN 4.2 03/07/2022 0320   AST 25 03/07/2022 0320   AST 14 (L) 12/05/2021 0810   ALT 11 03/07/2022 0320   ALT 9 12/05/2021 0810   ALKPHOS 76 03/07/2022 0320   BILITOT 0.5 03/07/2022 0320   BILITOT 0.8 12/05/2021 0810   GFRNONAA >60 03/07/2022 0320   GFRNONAA >60 12/05/2021 0810    Imaging: CT abdomen and pelvis w/ contrast 1. Mildly distended small bowel loops in the mid lower abdomen and upper pelvis measuring up to  2.5 cm diameter. This represents small bowel immediately proximal to the patient's ileostomy and fecalization of contents in these small bowel loops is consistent with decreased small bowel transit time. There is subtle ill definition in the wall of these bowel loops with subtle associated perienteric edema. Features may be related to an apparent stricture at the level of the ileostomy as it passes through the rectus sheath. 2. Trace intraperitoneal free fluid in the pelvis. 3. Postsurgical changes in the pelvis compatible with previous low anterior resection.  ASSESSMENT & PLAN:   Assessment & Plan by Problem: Principal Problem:   Ileostomy stenosis (HCC)  Anas Reister is a 37 y.o. 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 Currently awaiting surgery consultation.   Abdominal pain concern for ileostomy stricture Presented with abdominal pain, nausea, and vomiting that onset yesterday evening. Has had no output in his ostomy and significant abdominal discomfort. Does have significant abdominal pain on my exam with leaking around ileostomy site. Unable to assess stoma with current ostomy pouch. On my review of his imaging does have narrowing near ileostomy site with small bowel dilation. Awaiting surgical evaluation.  -appreciate surgery assistance, pending recommendations -pain control with morphine '2mg'$  q4h PRN -zofran PRN for nausea -replete electrolytes  Stage IV adenocarcinoma of the rectum Diagnosed with rectal cancer with metastatic disease to the liver and lymphovascular invasion 04/2021. He follows closely with Dr. Benay Spice of oncology. Has received 8 cycles of  FOLFOX  chemotherapy and radiation. Also s/p hepatic wedge resection and diverting loop ileostomy 01/2022. Per patient plan is for ileostomy reversal in the coming weeks and repeat CEA next month. Port-a-cath to remain in place for now per patient. -follow up outpatient with oncology  Tobacco use disorder Encouraged cessation, especially in lieu of upcoming surgical procedures. Has tried nicotine supplementation in the past without improvement.  -follow up and discuss medical interventions for tobacco cessation  Diet: NPO VTE: SCDs IVF: None,None Code: Full  Prior to Admission Living Arrangement: Home, living spouse Anticipated Discharge Location: Home Barriers to Discharge: surgical consultation and possible intervention  Dispo: Admit patient to Observation with expected length of stay less than 2 midnights.  Signed: Riesa Pope, MD Internal Medicine Resident PGY-3  03/07/2022, 8:47 AM

## 2022-03-07 NOTE — Plan of Care (Signed)
  Problem: Bowel/Gastric: Goal: Gastrointestinal status for postoperative course will improve Outcome: Progressing   

## 2022-03-07 NOTE — ED Triage Notes (Signed)
Pt reports N/V. "It feels like I have a blockage"

## 2022-03-08 DIAGNOSIS — C787 Secondary malignant neoplasm of liver and intrahepatic bile duct: Secondary | ICD-10-CM

## 2022-03-08 LAB — BASIC METABOLIC PANEL
Anion gap: 6 (ref 5–15)
BUN: 7 mg/dL (ref 6–20)
CO2: 26 mmol/L (ref 22–32)
Calcium: 8.6 mg/dL — ABNORMAL LOW (ref 8.9–10.3)
Chloride: 103 mmol/L (ref 98–111)
Creatinine, Ser: 0.83 mg/dL (ref 0.61–1.24)
GFR, Estimated: >60 mL/min (ref >60)
Glucose, Bld: 98 mg/dL (ref 70–99)
Potassium: 3.5 mmol/L (ref 3.5–5.1)
Sodium: 135 mmol/L (ref 135–145)

## 2022-03-08 LAB — MAGNESIUM: Magnesium: 2 mg/dL (ref 1.7–2.4)

## 2022-03-08 MED ORDER — HEPARIN SOD (PORK) LOCK FLUSH 100 UNIT/ML IV SOLN: 500.0000 [IU] | INTRAVENOUS | Status: DC | PRN

## 2022-03-08 MED ORDER — HEPARIN SOD (PORK) LOCK FLUSH 100 UNIT/ML IV SOLN
500.0000 [IU] | Freq: Once | INTRAVENOUS | Status: AC
  Administered 2022-03-08: 500 [IU] via INTRAVENOUS
  Filled 2022-03-08: qty 5

## 2022-03-08 MED ORDER — SODIUM CHLORIDE 0.9% FLUSH: 10.0000 mL | INTRAVENOUS | Status: DC | PRN

## 2022-03-08 NOTE — Progress Notes (Signed)
Subjective: Jeremiah Allen known to [facility], Dr. Marcello Moores, and the general surgery service due to a recent admission at [facility] several weeks ago.  The Jeremiah Allen has stage IV rectal cancer with mets to his liver that required chemo/radiation and hepatic wedge resection (Dr. Zenia Resides 08/04/21) as well as robotic LAR and diverting loop ileostomy by Dr. Marcello Moores on 01/15/22.  He returned to the ED in early January due to concerns for issues with his stoma.  It was noted to be slightly prolapsed and was reduced.  It was noted to be patent at this time.  He has not been having issues since that time until last night after eating dinner he began having abdominal pain, nausea, vomiting.  He has put very little out of his bag since then.  Due to persistent symptoms, he presented to [facility] for evaluation.  His labs are relatively stable, and his CT scan reveals mildly distended small bowel loops immediately proximal to his ileostomy with fecalization of contents. Features are concerning for stricture at the level of his ileostomy.  We have been asked to see him for further recommendations.  Jeremiah Allen feeling well this morning and has had copious ostomy output since digitization and suppository yesterday. Tolerating liquids, trying soft diet now.   ROS: See above, otherwise other systems negative  Objective: Vital signs in last 24 hours: Temp:  [98 F (36.7 C)-98.5 F (36.9 C)] 98 F (36.7 C) (02/04 0412) Pulse Rate:  [64-97] 67 (02/04 0412) Resp:  [12-19] 18 (02/04 0412) BP: (103-135)/(52-72) 103/62 (02/04 0412) SpO2:  [92 %-100 %] 98 % (02/04 0412) Weight:  [73 kg] 73 kg (02/04 0412) Last BM Date : 03/07/22  Intake/Output from previous day: 02/03 0701 - 02/04 0700 In: 747.2 [I.V.:747.2] Out: 30 [Stool:30] Intake/Output this shift: No intake/output data recorded.  PE: Gen: appears to not feel well and nauseated Unlabored Abd s/nt  Lab Results:  Recent Labs    03/07/22 0320  WBC 6.4  HGB 13.9  HCT 40.6  PLT  200    BMET Recent Labs    03/07/22 0320  NA 133*  K 3.4*  CL 101  CO2 21*  GLUCOSE 129*  BUN <5*  CREATININE 0.92  CALCIUM 9.1    PT/INR No results for input(s): "LABPROT", "INR" in the last 72 hours. CMP     Component Value Date/Time   NA 133 (L) 03/07/2022 0320   K 3.4 (L) 03/07/2022 0320   CL 101 03/07/2022 0320   CO2 21 (L) 03/07/2022 0320   GLUCOSE 129 (H) 03/07/2022 0320   BUN <5 (L) 03/07/2022 0320   CREATININE 0.92 03/07/2022 0320   CREATININE 1.02 12/05/2021 0810   CALCIUM 9.1 03/07/2022 0320   PROT 7.2 03/07/2022 0320   ALBUMIN 4.2 03/07/2022 0320   AST 25 03/07/2022 0320   AST 14 (L) 12/05/2021 0810   ALT 11 03/07/2022 0320   ALT 9 12/05/2021 0810   ALKPHOS 76 03/07/2022 0320   BILITOT 0.5 03/07/2022 0320   BILITOT 0.8 12/05/2021 0810   GFRNONAA >60 03/07/2022 0320   GFRNONAA >60 12/05/2021 0810   Lipase     Component Value Date/Time   LIPASE 29 03/07/2022 0320       Studies/Results: CT ABDOMEN PELVIS W CONTRAST  Result Date: 03/07/2022 CLINICAL DATA:  Abdominal pain. History of rectal cancer. * Tracking Code: BO * EXAM: CT ABDOMEN AND PELVIS WITH CONTRAST TECHNIQUE: Multidetector CT imaging of the abdomen and pelvis was performed using the  standard protocol following bolus administration of intravenous contrast. RADIATION DOSE REDUCTION: This exam was performed according to the departmental dose-optimization program which includes automated exposure control, adjustment of the mA and/or kV according to Jeremiah Allen size and/or use of iterative reconstruction technique. CONTRAST:  65m OMNIPAQUE IOHEXOL 350 MG/ML SOLN COMPARISON:  02/04/2022 FINDINGS: Lower chest: Unremarkable. Hepatobiliary: 3 discrete areas of surgical change in the liver compatible with prior wedge resection. No new suspicious liver lesion on today's study. There is no evidence for gallstones, gallbladder wall thickening, or pericholecystic fluid. No intrahepatic or extrahepatic biliary  dilation. Pancreas: No focal mass lesion. No dilatation of the main duct. No intraparenchymal cyst. No peripancreatic edema. Spleen: No splenomegaly. No focal mass lesion. Adrenals/Urinary Tract: No adrenal nodule or mass. Kidneys unremarkable. No evidence for hydroureter. The urinary bladder appears normal for the degree of distention. Stomach/Bowel: Stomach is moderately distended with fluid. Duodenum is normally positioned as is the ligament of Treitz. There are some mildly distended small bowel loops in the mid lower abdomen and upper pelvis measuring up to 2.5 cm diameter. This represents small bowel immediately proximal to the Jeremiah Allen's ileostomy and fecalization of contents in the small bowel loops is consistent with decreased small bowel transit time. There is subtle ill definition in the wall of these bowel loops with subtle associated perienteric edema. Features may be related to an apparent stricture at the level of the ileostomy as it passes through the rectus sheath (see image 44 of series 3). Terminal ileum and appendix are unremarkable. Colon is opacified compatible with retained contrast material from barium enema of 02/27/2022. Postsurgical changes in the pelvis compatible with previous low anterior resection. Stable appearance of presacral and perirectal edema. Vascular/Lymphatic: No abdominal aortic aneurysm. No abdominal aortic atherosclerotic calcification. There is no gastrohepatic or hepatoduodenal ligament lymphadenopathy. No retroperitoneal or mesenteric lymphadenopathy. No pelvic sidewall lymphadenopathy. Reproductive: The prostate gland and seminal vesicles are unremarkable. Other: Trace intraperitoneal free fluid noted in the pelvis. Musculoskeletal: No worrisome lytic or sclerotic osseous abnormality. IMPRESSION: 1. Mildly distended small bowel loops in the mid lower abdomen and upper pelvis measuring up to 2.5 cm diameter. This represents small bowel immediately proximal to the  Jeremiah Allen's ileostomy and fecalization of contents in these small bowel loops is consistent with decreased small bowel transit time. There is subtle ill definition in the wall of these bowel loops with subtle associated perienteric edema. Features may be related to an apparent stricture at the level of the ileostomy as it passes through the rectus sheath. 2. Trace intraperitoneal free fluid in the pelvis. 3. Postsurgical changes in the pelvis compatible with previous low anterior resection. Electronically Signed   By: EMisty StanleyM.D.   On: 03/07/2022 06:11   DG ABD ACUTE 2+V W 1V CHEST  Result Date: 03/07/2022 CLINICAL DATA:  Small-bowel obstruction EXAM: DG ABDOMEN ACUTE WITH 1 VIEW CHEST COMPARISON:  05/02/2021, CT 02/04/2022 FINDINGS: Lungs are clear. No pneumothorax or pleural effusion. Cardiac size within normal limits. Right internal jugular chest port tip seen at the superior cavoatrial junction. Pulmonary vascularity is normal. Barium contrast noted within a decompressed colon. Normal abdominal gas pattern. Ostomy appliance noted within the right lower quadrant. No free intraperitoneal gas. Surgical clips are seen in the right upper quadrant. No organomegaly. No acute bone abnormality. IMPRESSION: 1. Nonobstructive bowel gas pattern. Barium contrast noted within a decompressed colon. Electronically Signed   By: AFidela SalisburyM.D.   On: 03/07/2022 03:37    Anti-infectives: Anti-infectives (From admission,  onward)    None        Assessment/Plan Stage IV rectal adenocarcinoma s/p LAR and diverting loop ileostomy by Dr. Marcello Moores in 12/23 with stenosis of ileostomy and mechanical bowel obstruction -ok for discharge if tolerating soft diet today and continued ostomy output -in the process of getting a takedown set up with Dr. Marcello Moores in the near future.  FEN - soft VTE - ok for chemical prophylaxis from our standpoint ID - none currently needed  I reviewed ED provider notes, hospitalist  notes, last 24 h vitals and pain scores, last 48 h intake and output, last 24 h labs and trends, and last 24 h imaging results.   LOS: 0 days    Clovis Riley , MD [facility] Surgery 03/08/2022, 9:51 AM Please see Amion for pager number during day hours 7:00am-4:30pm or 7:00am -11:30am on weekends

## 2022-03-08 NOTE — Discharge Summary (Signed)
Name: Jeremiah Allen MRN: [id number] DOB: [birth year] 37 y.o. PCP: Hayden Rasmussen, MD  Date of Admission: 03/07/2022  2:46 AM Date of Discharge: 03/08/2022 Attending Physician: Axel Filler, *  Discharge Diagnosis: 1. Principal Problem:   Small bowel obstruction (HCC) Active Problems:   Rectal cancer metastasized to liver (HCC)   Ileostomy prolapse (HCC)   Ileostomy stenosis (HCC)   Discharge Medications: Allergies as of 03/08/2022       Reactions   Crab Extract Allergy Skin Test Anaphylaxis, Swelling   Throat swelling (CRAB ALLERGY ONLY)   Dilaudid [hydromorphone] Itching, Other (See Comments)   Skin tingling sensations         Medication List     STOP taking these medications    hydrOXYzine 25 MG tablet Commonly known as: ATARAX   loperamide 2 MG tablet Commonly known as: Imodium A-D   methocarbamol 500 MG tablet Commonly known as: ROBAXIN   ondansetron 8 MG tablet Commonly known as: ZOFRAN   oxyCODONE 5 MG immediate release tablet Commonly known as: Oxy IR/ROXICODONE   prochlorperazine 10 MG tablet Commonly known as: COMPAZINE       TAKE these medications    acetaminophen 500 MG tablet Commonly known as: TYLENOL Take 500-1,000 mg by mouth every 6 (six) hours as needed for mild pain or moderate pain.   albuterol 108 (90 Base) MCG/ACT inhaler Commonly known as: VENTOLIN HFA Inhale 1-2 puffs into the lungs every 6 (six) hours as needed for wheezing or shortness of breath.   famotidine 20 MG tablet Commonly known as: PEPCID Take 20 mg by mouth 2 (two) times daily as needed for heartburn or indigestion.   gabapentin 600 MG tablet Commonly known as: NEURONTIN Take 1 tablet (600 mg total) by mouth 3 (three) times daily.        Disposition and follow-up:   Mr.Jeremiah Allen was discharged from [facility] in Good condition.  At the hospital follow up visit please address:  1.  Ileostomy Stenosis Patient's ileostomy  is patent and passing stool on day of discharge, although ostomy was noted to be narrowed on exam. Recommend follow-up with Gastroenterology.   Smoking Cessation Consider follow-up of smoking cessation.   2.  Labs / imaging needed at time of follow-up: None  3.  Pending labs/ test needing follow-up: None  Follow-up Appointments:  None scheduled  Hospital Course by problem list:  Ileostomy Stenosis Patient presented with one day of abdominal pain, nausea, and vomiting after eating a meal, CT showed evidence of dilated small bowel loops proximal to ileostomy concerning for ileostomy stenosis. Patient was admitted for further management. He declined NG tube due to difficulty breathing post-insertion. Patient improved with supportive care, electrolyte repletion, and suppository and began passing stool. General Surgery will follow up outpatient for ileostomy reversal, recommended diet low in raw/uncooked fruits and vegetables in the interim.   Discharge Subjective: Patient states he feels much better this morning and is feeling hungry, he also reports copious stool output overnight. He denies abdominal pain, nausea, or vomiting. He states that he declined NG tube due to respiratory difficulty after insertion. Patient says he is eager to go home today.   Discharge Exam:   BP 103/62 (BP Location: Left Arm)   Pulse 67   Temp 98 F (36.7 C) (Oral)   Resp 18   Ht '5\' 9"'$  (1.753 m)   Wt 73 kg   SpO2 98%   BMI 23.77 kg/m   General: Alert, laying  comfortably in bed in no acute distress.  CV: RRR, no murmurs, rubs, or gallops Pulm: Normal respiratory effort on room air Abd: Normoactive bowel sounds. Soft, non-distended. Non-tender to palpation. No rigidity or guarding. Small amount of effluent in bandage around ileostomy, site otherwise appears clean.  Skin: Warm and dry Neuro: Alert and oriented Psych: Pleasant, appropriate affect   Pertinent Labs, Studies, and Procedures:     Latest Ref  Rng & Units 03/07/2022    3:20 AM 02/04/2022    1:56 AM 02/04/2022    1:45 AM  CBC  WBC 4.0 - 10.5 K/uL 6.4   4.1   Hemoglobin 13.0 - 17.0 g/dL 13.9  12.9  13.0   Hematocrit 39.0 - 52.0 % 40.6  38.0  38.0   Platelets 150 - 400 K/uL 200   265        Latest Ref Rng & Units 03/08/2022    9:38 AM 03/07/2022    3:20 AM 02/04/2022    1:56 AM  BMP  Glucose 70 - 99 mg/dL 98  129  80   BUN 6 - 20 mg/dL 7  <5  9   Creatinine 0.61 - 1.24 mg/dL 0.83  0.92  1.00   Sodium 135 - 145 mmol/L 135  133  137   Potassium 3.5 - 5.1 mmol/L 3.5  3.4  3.9   Chloride 98 - 111 mmol/L 103  101  103   CO2 22 - 32 mmol/L 26  21    Calcium 8.9 - 10.3 mg/dL 8.6  9.1       CT ABDOMEN PELVIS W CONTRAST 03/07/2022 IMPRESSION: 1. Mildly distended small bowel loops in the mid lower abdomen and upper pelvis measuring up to 2.5 cm diameter. This represents small bowel immediately proximal to the patient's ileostomy and fecalization of contents in these small bowel loops is consistent with decreased small bowel transit time. There is subtle ill definition in the wall of these bowel loops with subtle associated perienteric edema. Features may be related to an apparent stricture at the level of the ileostomy as it passes through the rectus sheath. 2. Trace intraperitoneal free fluid in the pelvis. 3. Postsurgical changes in the pelvis compatible with previous low anterior resection.    Discharge Instructions: Discharge Instructions     Call MD for:  persistant nausea and vomiting   Complete by: As directed    Call MD for:  severe uncontrolled pain   Complete by: As directed    Diet - low sodium heart healthy   Complete by: As directed    Increase activity slowly   Complete by: As directed       Signed: Nani Gasser, MD 03/08/2022, 2:20 PM   Pager: [phone]

## 2022-03-08 NOTE — Discharge Instructions (Addendum)
Mr. Jeremiah Allen You were admitted to the hospital for management of narrowing in your ileostomy. We are glad you are feeling better and you are being discharged home. Thank you for allowing [place] to participate in your care.   We recommend that you: Follow up with General Surgery after discharge and follow their low-fiber diet recommendation to help prevent further complications. Continue to follow-up with your Oncologist. Quitting smoking is the best thing that you can do for your health. Talk to your doctors about strategies for quitting.  We recommend stopping Imodium as this can increase the risk of bowel obstruction. Nausea medicine like Zofran and Compazine can also increase the risk of bowel obstruction from constipation.

## 2022-03-10 ENCOUNTER — Inpatient Hospital Stay: Payer: Medicaid Other

## 2022-03-10 ENCOUNTER — Encounter: Payer: Self-pay | Admitting: *Deleted

## 2022-03-10 ENCOUNTER — Other Ambulatory Visit: Payer: Medicaid Other

## 2022-03-10 ENCOUNTER — Inpatient Hospital Stay: Payer: Medicaid Other | Admitting: Oncology

## 2022-03-10 ENCOUNTER — Telehealth: Payer: Self-pay | Admitting: Oncology

## 2022-03-10 NOTE — Telephone Encounter (Signed)
Attempted to contact patient in regards to no show visits on today's date. No answer so voicemail was left to call back

## 2022-03-10 NOTE — Progress Notes (Signed)
Message was left that he thought his appointment was at 1145. Per Dr. Benay Spice, reschedule to after his surgery.

## 2022-03-11 ENCOUNTER — Other Ambulatory Visit (HOSPITAL_BASED_OUTPATIENT_CLINIC_OR_DEPARTMENT_OTHER): Payer: Self-pay

## 2022-03-13 NOTE — Patient Instructions (Signed)
SURGICAL WAITING ROOM VISITATION Patients having surgery or a procedure may have no more than 2 support people in the waiting area - these visitors may rotate.    If the patient needs to stay at the hospital during part of their recovery, the visitor guidelines for inpatient rooms apply. Pre-op nurse will coordinate an appropriate time for 1 support person to accompany patient in pre-op.  This support person may not rotate.    Please refer to the [facility] website for the visitor guidelines for Inpatients (after your surgery is over and you are in a regular room).   Due to an increase in RSV and influenza rates and associated hospitalizations, children ages 32 and under may not visit patients in [place].     Your procedure is scheduled on: 03-18-22   Report to [facility] Main Entrance    Report to admitting at  12:15 PM   Call this number if you have problems the morning of surgery [phone]   Follow a clear liquid diet the day before surgery   Do not eat food :After Midnight.   After Midnight you may have the following liquids until 11:30 AM DAY OF SURGERY  Water Non-Citrus Juices (without pulp, NO RED) Carbonated Beverages Black Coffee (NO MILK/CREAM OR CREAMERS, sugar ok)  Clear Tea (NO MILK/CREAM OR CREAMERS, sugar ok) regular and decaf                             Plain Jell-O (NO RED)                                           Fruit ices (not with fruit pulp, NO RED)                                     Popsicles (NO RED)                                                               Sports drinks like Gatorade (NO RED)  Drink 2 Pre-surgery clear Ensure the evening before surgery (complete by 10 PM)                   The day of surgery:  Drink ONE (1) Pre-Surgery Clear Ensure  at 11:30 AM the morning of surgery. Drink in one sitting. Do not sip.  This drink was given to you during your hospital  pre-op appointment visit. Nothing else to drink  after completing the Pre-Surgery Clear Ensure.          If you have questions, please contact your surgeon's office.   FOLLOW  ANY ADDITIONAL PRE OP INSTRUCTIONS YOU RECEIVED FROM YOUR SURGEON'S OFFICE!!!     Oral Hygiene is also important to reduce your risk of infection.                                    Remember - BRUSH YOUR TEETH THE MORNING OF SURGERY  WITH YOUR REGULAR TOOTHPASTE   Do NOT smoke after Midnight   Take these medicines the morning of surgery with A SIP OF WATER:   Famotidine  Tylenol if needed  Hydroxyzine  Okay to use inhalers                              You may not have any metal on your body including  jewelry, and body piercing             Do not wear lotions, powders, cologne, or deodorant              Men may shave face and neck.   Do not bring valuables to the hospital. [place].   Contacts, dentures or bridgework may not be worn into surgery.   Bring small overnight bag day of surgery.   DO NOT [place]. PHARMACY WILL DISPENSE MEDICATIONS LISTED ON YOUR MEDICATION LIST TO YOU DURING YOUR ADMISSION [place]!               Please read over the following fact sheets you were given: IF [place]  If you received a COVID test during your pre-op visit  it is requested that you wear a mask when out in public, stay away from anyone that may not be feeling well and notify your surgeon if you develop symptoms. If you test positive for Covid or have been in contact with anyone that has tested positive in the last 10 days please notify you surgeon.  [place] - Preparing for Surgery Before surgery, you can play an important role.  Because skin is not sterile, your skin needs to be as free of germs as possible.  You can reduce the number of germs on your skin by washing with CHG (chlorahexidine gluconate) soap  before surgery.  CHG is an antiseptic cleaner which kills germs and bonds with the skin to continue killing germs even after washing. Please DO NOT use if you have an allergy to CHG or antibacterial soaps.  If your skin becomes reddened/irritated stop using the CHG and inform your nurse when you arrive at Short Stay. Do not shave (including legs and underarms) for at least 48 hours prior to the first CHG shower.  You may shave your face/neck.  Please follow these instructions carefully:  1.  Shower with CHG Soap the night before surgery and the  morning of surgery.  2.  If you choose to wash your hair, wash your hair first as usual with your normal  shampoo.  3.  After you shampoo, rinse your hair and body thoroughly to remove the shampoo.                             4.  Use CHG as you would any other liquid soap.  You can apply chg directly to the skin and wash.  Gently with a scrungie or clean washcloth.  5.  Apply the CHG Soap to your body ONLY FROM THE NECK DOWN.   Do   not use on face/ open                           Wound or open sores. Avoid contact  with eyes, ears mouth and   genitals (private parts).                       Wash face,  Genitals (private parts) with your normal soap.             6.  Wash thoroughly, paying special attention to the area where your    surgery  will be performed.  7.  Thoroughly rinse your body with warm water from the neck down.  8.  DO NOT shower/wash with your normal soap after using and rinsing off the CHG Soap.                9.  Pat yourself dry with a clean towel.            10.  Wear clean pajamas.            11.  Place clean sheets on your bed the night of your first shower and do not  sleep with pets. Day of Surgery : Do not apply any lotions/deodorants the morning of surgery.  Please wear clean clothes to the hospital/surgery center.  FAILURE TO FOLLOW THESE INSTRUCTIONS MAY RESULT IN THE CANCELLATION OF YOUR SURGERY  PATIENT  SIGNATURE_________________________________  NURSE SIGNATURE__________________________________  ________________________________________________________________________    Jeremiah Allen  An incentive spirometer is a tool that can help keep your lungs clear and active. This tool measures how well you are filling your lungs with each breath. Taking long deep breaths may help reverse or decrease the chance of developing breathing (pulmonary) problems (especially infection) following: A long period of time when you are unable to move or be active. BEFORE THE PROCEDURE  If the spirometer includes an indicator to show your best effort, your nurse or respiratory therapist will set it to a desired goal. If possible, sit up straight or lean slightly forward. Try not to slouch. Hold the incentive spirometer in an upright position. INSTRUCTIONS FOR USE  Sit on the edge of your bed if possible, or sit up as far as you can in bed or on a chair. Hold the incentive spirometer in an upright position. Breathe out normally. Place the mouthpiece in your mouth and seal your lips tightly around it. Breathe in slowly and as deeply as possible, raising the piston or the ball toward the top of the column. Hold your breath for 3-5 seconds or for as long as possible. Allow the piston or ball to fall to the bottom of the column. Remove the mouthpiece from your mouth and breathe out normally. Rest for a few seconds and repeat Steps 1 through 7 at least 10 times every 1-2 hours when you are awake. Take your time and take a few normal breaths between deep breaths. The spirometer may include an indicator to show your best effort. Use the indicator as a goal to work toward during each repetition. After each set of 10 deep breaths, practice coughing to be sure your lungs are clear. If you have an incision (the cut made at the time of surgery), support your incision when coughing by placing a pillow or rolled up towels  firmly against it. Once you are able to get out of bed, walk around indoors and cough well. You may stop using the incentive spirometer when instructed by your caregiver.  RISKS AND COMPLICATIONS Take your time so you do not get dizzy or light-headed. If you are in pain, you may need to take or  ask for pain medication before doing incentive spirometry. It is harder to take a deep breath if you are having pain. AFTER USE Rest and breathe slowly and easily. It can be helpful to keep track of a log of your progress. Your caregiver can provide you with a simple table to help with this. If you are using the spirometer at home, follow these instructions: [place] IF:  You are having difficultly using the spirometer. You have trouble using the spirometer as often as instructed. Your pain medication is not giving enough relief while using the spirometer. You develop fever of 100.5 F (38.1 C) or higher. SEEK IMMEDIATE MEDICAL CARE IF:  You cough up bloody sputum that had not been present before. You develop fever of 102 F (38.9 C) or greater. You develop worsening pain at or near the incision site. MAKE SURE YOU:  Understand these instructions. Will watch your condition. Will get help right away if you are not doing well or get worse. Document Released: 06/01/2006 Document Revised: 04/13/2011 Document Reviewed: 08/02/2006 [facility] Patient Information 2014 [place], [place].   ________________________________________________________________________

## 2022-03-14 ENCOUNTER — Other Ambulatory Visit (HOSPITAL_BASED_OUTPATIENT_CLINIC_OR_DEPARTMENT_OTHER): Payer: Self-pay

## 2022-03-16 ENCOUNTER — Encounter (HOSPITAL_COMMUNITY): Payer: Self-pay

## 2022-03-16 ENCOUNTER — Other Ambulatory Visit (HOSPITAL_BASED_OUTPATIENT_CLINIC_OR_DEPARTMENT_OTHER): Payer: Self-pay

## 2022-03-16 ENCOUNTER — Encounter (HOSPITAL_COMMUNITY)
Admission: RE | Admit: 2022-03-16 | Discharge: 2022-03-16 | Disposition: A | Discharge status: Home / Self Care | Payer: Medicaid Other | Source: Ambulatory Visit | Attending: General Surgery | Admitting: General Surgery

## 2022-03-16 ENCOUNTER — Other Ambulatory Visit: Payer: Self-pay

## 2022-03-16 DIAGNOSIS — Z01818 Encounter for other preprocedural examination: Secondary | ICD-10-CM | POA: Diagnosis not present

## 2022-03-16 MED ORDER — METHOCARBAMOL 500 MG PO TABS
500.0000 mg | ORAL_TABLET | Freq: Three times a day (TID) | ORAL | 0 refills | Status: DC
  Filled 2022-03-16: qty 30, 10d supply, fill #0

## 2022-03-16 NOTE — Progress Notes (Addendum)
COVID Vaccine Completed:  Yes  Date of COVID positive in last 90 days:  No  PCP - Horald Pollen, MD Cardiologist - N/A  Chest x-ray - CT chest 01-05-22 Epic EKG - - N/A Stress Test - - N/A ECHO - - N/A Cardiac Cath - - N/A Pacemaker/ICD device last checked: Spinal Cord Stimulator: - N/A  Bowel Prep - No per patient  Sleep Study - N/A CPAP -   Fasting Blood Sugar - N/A Checks Blood Sugar _____ times a day  Last dose of GLP1 agonist-  N/A GLP1 instructions:  N/A   Last dose of SGLT-2 inhibitors-  N/A SGLT-2 instructions: N/A  Blood Thinner Instructions: Aspirin Instructions: Last Dose:  Activity level:  Can go up a flight of stairs and perform activities of daily living without stopping and without symptoms of chest pain or shortness of breath.  Anesthesia review: N/A  Patient denies shortness of breath, fever, cough and chest pain at PAT appointment  Patient verbalized understanding of instructions that were given to them at the PAT appointment. Patient was also instructed that they will need to review over the PAT instructions again at home before surgery.

## 2022-03-18 ENCOUNTER — Other Ambulatory Visit: Payer: Self-pay

## 2022-03-18 ENCOUNTER — Inpatient Hospital Stay (HOSPITAL_COMMUNITY): Payer: Medicaid Other | Admitting: Anesthesiology

## 2022-03-18 ENCOUNTER — Inpatient Hospital Stay (HOSPITAL_COMMUNITY)
Admission: RE | Admit: 2022-03-18 | Discharge: 2022-03-21 | DRG: 330 | Disposition: A | Discharge status: Home / Self Care | Payer: Medicaid Other | Attending: General Surgery | Admitting: General Surgery

## 2022-03-18 ENCOUNTER — Encounter (HOSPITAL_COMMUNITY): Payer: Self-pay | Admitting: General Surgery

## 2022-03-18 ENCOUNTER — Encounter (HOSPITAL_COMMUNITY)
Admission: RE | Disposition: A | Discharge status: Home / Self Care | Payer: Self-pay | Source: Home / Self Care | Attending: General Surgery

## 2022-03-18 DIAGNOSIS — K219 Gastro-esophageal reflux disease without esophagitis: Secondary | ICD-10-CM | POA: Diagnosis present

## 2022-03-18 DIAGNOSIS — Z9049 Acquired absence of other specified parts of digestive tract: Secondary | ICD-10-CM

## 2022-03-18 DIAGNOSIS — K529 Noninfective gastroenteritis and colitis, unspecified: Secondary | ICD-10-CM | POA: Diagnosis not present

## 2022-03-18 DIAGNOSIS — Z885 Allergy status to narcotic agent status: Secondary | ICD-10-CM

## 2022-03-18 DIAGNOSIS — G629 Polyneuropathy, unspecified: Secondary | ICD-10-CM | POA: Diagnosis present

## 2022-03-18 DIAGNOSIS — F419 Anxiety disorder, unspecified: Secondary | ICD-10-CM | POA: Diagnosis present

## 2022-03-18 DIAGNOSIS — K9413 Enterostomy malfunction: Secondary | ICD-10-CM | POA: Diagnosis not present

## 2022-03-18 DIAGNOSIS — F17210 Nicotine dependence, cigarettes, uncomplicated: Secondary | ICD-10-CM | POA: Diagnosis present

## 2022-03-18 DIAGNOSIS — C20 Malignant neoplasm of rectum: Secondary | ICD-10-CM

## 2022-03-18 DIAGNOSIS — Z8249 Family history of ischemic heart disease and other diseases of the circulatory system: Secondary | ICD-10-CM

## 2022-03-18 DIAGNOSIS — C787 Secondary malignant neoplasm of liver and intrahepatic bile duct: Secondary | ICD-10-CM | POA: Diagnosis not present

## 2022-03-18 DIAGNOSIS — F319 Bipolar disorder, unspecified: Secondary | ICD-10-CM | POA: Diagnosis present

## 2022-03-18 DIAGNOSIS — Z85048 Personal history of other malignant neoplasm of rectum, rectosigmoid junction, and anus: Secondary | ICD-10-CM

## 2022-03-18 DIAGNOSIS — Z91013 Allergy to seafood: Secondary | ICD-10-CM | POA: Diagnosis not present

## 2022-03-18 DIAGNOSIS — R110 Nausea: Secondary | ICD-10-CM | POA: Diagnosis not present

## 2022-03-18 DIAGNOSIS — T451X5A Adverse effect of antineoplastic and immunosuppressive drugs, initial encounter: Secondary | ICD-10-CM | POA: Diagnosis not present

## 2022-03-18 DIAGNOSIS — L089 Local infection of the skin and subcutaneous tissue, unspecified: Secondary | ICD-10-CM | POA: Diagnosis not present

## 2022-03-18 DIAGNOSIS — Z432 Encounter for attention to ileostomy: Secondary | ICD-10-CM | POA: Diagnosis not present

## 2022-03-18 DIAGNOSIS — K6389 Other specified diseases of intestine: Secondary | ICD-10-CM | POA: Diagnosis not present

## 2022-03-18 HISTORY — PX: ILEOSTOMY CLOSURE: SHX1784

## 2022-03-18 SURGERY — CLOSURE, ILEOSTOMY
Anesthesia: General

## 2022-03-18 MED ORDER — SODIUM CHLORIDE 0.9 % IV SOLN
2.0000 g | Freq: Two times a day (BID) | INTRAVENOUS | Status: AC
  Administered 2022-03-19: 2 g via INTRAVENOUS
  Filled 2022-03-18: qty 2

## 2022-03-18 MED ORDER — SODIUM CHLORIDE 0.9 % IV SOLN
12.5000 mg | Freq: Four times a day (QID) | INTRAVENOUS | Status: DC | PRN
  Administered 2022-03-18 – 2022-03-19 (×2): 12.5 mg via INTRAVENOUS
  Filled 2022-03-18 (×2): qty 12.5

## 2022-03-18 MED ORDER — SACCHAROMYCES BOULARDII 250 MG PO CAPS
250.0000 mg | ORAL_CAPSULE | Freq: Two times a day (BID) | ORAL | Status: DC
  Administered 2022-03-18 – 2022-03-20 (×5): 250 mg via ORAL
  Filled 2022-03-18 (×5): qty 1

## 2022-03-18 MED ORDER — ORAL CARE MOUTH RINSE: 15.0000 mL | Freq: Once | OROMUCOSAL | Status: AC

## 2022-03-18 MED ORDER — FENTANYL CITRATE (PF) 100 MCG/2ML IJ SOLN
INTRAMUSCULAR | Status: DC | PRN
  Administered 2022-03-18: 100 ug via INTRAVENOUS

## 2022-03-18 MED ORDER — MORPHINE SULFATE (PF) 2 MG/ML IV SOLN
2.0000 mg | INTRAVENOUS | Status: DC | PRN
  Administered 2022-03-18 – 2022-03-20 (×9): 2 mg via INTRAVENOUS
  Filled 2022-03-18 (×11): qty 1

## 2022-03-18 MED ORDER — ENSURE SURGERY PO LIQD
237.0000 mL | Freq: Two times a day (BID) | ORAL | Status: DC
  Administered 2022-03-20 (×2): 237 mL via ORAL

## 2022-03-18 MED ORDER — HYDROMORPHONE HCL 1 MG/ML IJ SOLN
0.2500 mg | INTRAMUSCULAR | Status: DC | PRN
  Administered 2022-03-18 (×2): 0.5 mg via INTRAVENOUS

## 2022-03-18 MED ORDER — PROMETHAZINE HCL 25 MG/ML IJ SOLN
INTRAMUSCULAR | Status: AC
  Administered 2022-03-18: 12.5 mg via INTRAVENOUS
  Filled 2022-03-18: qty 1

## 2022-03-18 MED ORDER — BUPIVACAINE HCL (PF) 0.25 % IJ SOLN
INTRAMUSCULAR | Status: AC
  Filled 2022-03-18: qty 30

## 2022-03-18 MED ORDER — LACTATED RINGERS IV SOLN: INTRAVENOUS | Status: DC

## 2022-03-18 MED ORDER — ACETAMINOPHEN 500 MG PO TABS
1000.0000 mg | ORAL_TABLET | Freq: Four times a day (QID) | ORAL | Status: DC
  Administered 2022-03-18 – 2022-03-21 (×9): 1000 mg via ORAL
  Filled 2022-03-18 (×10): qty 2

## 2022-03-18 MED ORDER — HYDROMORPHONE HCL 1 MG/ML IJ SOLN
INTRAMUSCULAR | Status: AC
  Administered 2022-03-18: 0.5 mg via INTRAVENOUS
  Filled 2022-03-18: qty 2

## 2022-03-18 MED ORDER — KCL IN DEXTROSE-NACL 20-5-0.45 MEQ/L-%-% IV SOLN
INTRAVENOUS | Status: AC
  Filled 2022-03-18: qty 1000

## 2022-03-18 MED ORDER — STERILE WATER FOR IRRIGATION IR SOLN
Status: DC | PRN
  Administered 2022-03-18: 1000 mL

## 2022-03-18 MED ORDER — MIDAZOLAM HCL 2 MG/2ML IJ SOLN
INTRAMUSCULAR | Status: DC | PRN
  Administered 2022-03-18: 2 mg via INTRAVENOUS

## 2022-03-18 MED ORDER — ENOXAPARIN SODIUM 40 MG/0.4ML IJ SOSY
40.0000 mg | PREFILLED_SYRINGE | INTRAMUSCULAR | Status: DC
  Administered 2022-03-19 – 2022-03-21 (×3): 40 mg via SUBCUTANEOUS
  Filled 2022-03-18 (×3): qty 0.4

## 2022-03-18 MED ORDER — FAMOTIDINE 20 MG PO TABS
20.0000 mg | ORAL_TABLET | Freq: Two times a day (BID) | ORAL | Status: DC | PRN
  Administered 2022-03-20: 20 mg via ORAL
  Filled 2022-03-18: qty 1

## 2022-03-18 MED ORDER — KETOROLAC TROMETHAMINE 30 MG/ML IJ SOLN
INTRAMUSCULAR | Status: DC | PRN
  Administered 2022-03-18: 30 mg via INTRAVENOUS

## 2022-03-18 MED ORDER — ENSURE PRE-SURGERY PO LIQD: 296.0000 mL | Freq: Once | ORAL | Status: DC

## 2022-03-18 MED ORDER — AMISULPRIDE (ANTIEMETIC) 5 MG/2ML IV SOLN
INTRAVENOUS | Status: AC
  Filled 2022-03-18: qty 4

## 2022-03-18 MED ORDER — ONDANSETRON HCL 4 MG/2ML IJ SOLN
INTRAMUSCULAR | Status: DC | PRN
  Administered 2022-03-18: 4 mg via INTRAVENOUS

## 2022-03-18 MED ORDER — PROPOFOL 10 MG/ML IV BOLUS
INTRAVENOUS | Status: DC | PRN
  Administered 2022-03-18: 50 mg via INTRAVENOUS
  Administered 2022-03-18: 150 mg via INTRAVENOUS

## 2022-03-18 MED ORDER — ROCURONIUM BROMIDE 10 MG/ML (PF) SYRINGE
PREFILLED_SYRINGE | INTRAVENOUS | Status: DC | PRN
  Administered 2022-03-18: 60 mg via INTRAVENOUS

## 2022-03-18 MED ORDER — ALBUTEROL SULFATE (2.5 MG/3ML) 0.083% IN NEBU: 3.0000 mL | INHALATION_SOLUTION | Freq: Four times a day (QID) | RESPIRATORY_TRACT | Status: DC | PRN

## 2022-03-18 MED ORDER — ACETAMINOPHEN 500 MG PO TABS: 1000.0000 mg | ORAL_TABLET | ORAL | Status: DC

## 2022-03-18 MED ORDER — DIPHENHYDRAMINE HCL 50 MG/ML IJ SOLN: 12.5000 mg | Freq: Four times a day (QID) | INTRAMUSCULAR | Status: DC | PRN

## 2022-03-18 MED ORDER — HYDROXYZINE HCL 25 MG PO TABS
25.0000 mg | ORAL_TABLET | Freq: Three times a day (TID) | ORAL | Status: DC
  Administered 2022-03-18 – 2022-03-20 (×7): 25 mg via ORAL
  Filled 2022-03-18 (×7): qty 1

## 2022-03-18 MED ORDER — GABAPENTIN 100 MG PO CAPS
300.0000 mg | ORAL_CAPSULE | Freq: Two times a day (BID) | ORAL | Status: DC
  Administered 2022-03-18 – 2022-03-20 (×5): 300 mg via ORAL
  Filled 2022-03-18 (×5): qty 3

## 2022-03-18 MED ORDER — KETOROLAC TROMETHAMINE 30 MG/ML IJ SOLN
INTRAMUSCULAR | Status: AC
  Filled 2022-03-18: qty 1

## 2022-03-18 MED ORDER — DEXAMETHASONE SODIUM PHOSPHATE 10 MG/ML IJ SOLN
INTRAMUSCULAR | Status: DC | PRN
  Administered 2022-03-18: 4 mg via INTRAVENOUS

## 2022-03-18 MED ORDER — HYDROMORPHONE HCL 1 MG/ML IJ SOLN
INTRAMUSCULAR | Status: AC
  Administered 2022-03-18: 0.5 mg via INTRAVENOUS
  Filled 2022-03-18: qty 1

## 2022-03-18 MED ORDER — BUPIVACAINE LIPOSOME 1.3 % IJ SUSP: 20.0000 mL | Freq: Once | INTRAMUSCULAR | Status: DC

## 2022-03-18 MED ORDER — ALUM & MAG HYDROXIDE-SIMETH 200-200-20 MG/5ML PO SUSP
30.0000 mL | Freq: Four times a day (QID) | ORAL | Status: DC | PRN
  Administered 2022-03-20: 30 mL via ORAL
  Filled 2022-03-18: qty 30

## 2022-03-18 MED ORDER — BUPIVACAINE HCL (PF) 0.25 % IJ SOLN
INTRAMUSCULAR | Status: DC | PRN
  Administered 2022-03-18: 30 mL

## 2022-03-18 MED ORDER — DEXAMETHASONE SODIUM PHOSPHATE 10 MG/ML IJ SOLN
INTRAMUSCULAR | Status: AC
  Filled 2022-03-18: qty 1

## 2022-03-18 MED ORDER — SUGAMMADEX SODIUM 200 MG/2ML IV SOLN
INTRAVENOUS | Status: DC | PRN
  Administered 2022-03-18: 150 mg via INTRAVENOUS

## 2022-03-18 MED ORDER — DIPHENHYDRAMINE HCL 12.5 MG/5ML PO ELIX: 12.5000 mg | ORAL_SOLUTION | Freq: Four times a day (QID) | ORAL | Status: DC | PRN

## 2022-03-18 MED ORDER — 0.9 % SODIUM CHLORIDE (POUR BTL) OPTIME
TOPICAL | Status: DC | PRN
  Administered 2022-03-18: 1000 mL

## 2022-03-18 MED ORDER — FENTANYL CITRATE (PF) 100 MCG/2ML IJ SOLN
INTRAMUSCULAR | Status: AC
  Filled 2022-03-18: qty 2

## 2022-03-18 MED ORDER — ONDANSETRON HCL 4 MG/2ML IJ SOLN
4.0000 mg | Freq: Four times a day (QID) | INTRAMUSCULAR | Status: DC | PRN
  Administered 2022-03-18 – 2022-03-19 (×2): 4 mg via INTRAVENOUS
  Filled 2022-03-18 (×3): qty 2

## 2022-03-18 MED ORDER — SIMETHICONE 80 MG PO CHEW
40.0000 mg | CHEWABLE_TABLET | Freq: Four times a day (QID) | ORAL | Status: DC | PRN
  Administered 2022-03-20: 40 mg via ORAL
  Filled 2022-03-18: qty 1

## 2022-03-18 MED ORDER — ENSURE PRE-SURGERY PO LIQD: 592.0000 mL | Freq: Once | ORAL | Status: DC

## 2022-03-18 MED ORDER — ALVIMOPAN 12 MG PO CAPS
12.0000 mg | ORAL_CAPSULE | Freq: Two times a day (BID) | ORAL | Status: DC
  Administered 2022-03-19 (×2): 12 mg via ORAL
  Filled 2022-03-18 (×2): qty 1

## 2022-03-18 MED ORDER — ONDANSETRON HCL 4 MG PO TABS: 4.0000 mg | ORAL_TABLET | Freq: Four times a day (QID) | ORAL | Status: DC | PRN

## 2022-03-18 MED ORDER — ALVIMOPAN 12 MG PO CAPS
12.0000 mg | ORAL_CAPSULE | ORAL | Status: AC
  Administered 2022-03-18: 12 mg via ORAL
  Filled 2022-03-18: qty 1

## 2022-03-18 MED ORDER — AMISULPRIDE (ANTIEMETIC) 5 MG/2ML IV SOLN
10.0000 mg | Freq: Once | INTRAVENOUS | Status: AC
  Administered 2022-03-18: 10 mg via INTRAVENOUS

## 2022-03-18 MED ORDER — SODIUM CHLORIDE 0.9 % IV SOLN
2.0000 g | INTRAVENOUS | Status: AC
  Administered 2022-03-18: 2 g via INTRAVENOUS
  Filled 2022-03-18: qty 2

## 2022-03-18 MED ORDER — LIDOCAINE HCL (PF) 2 % IJ SOLN
INTRAMUSCULAR | Status: AC
  Filled 2022-03-18: qty 5

## 2022-03-18 MED ORDER — CHLORHEXIDINE GLUCONATE 0.12 % MT SOLN
15.0000 mL | Freq: Once | OROMUCOSAL | Status: AC
  Administered 2022-03-18: 15 mL via OROMUCOSAL

## 2022-03-18 MED ORDER — MIDAZOLAM HCL 2 MG/2ML IJ SOLN
INTRAMUSCULAR | Status: AC
  Filled 2022-03-18: qty 2

## 2022-03-18 MED ORDER — KETAMINE HCL 10 MG/ML IJ SOLN
INTRAMUSCULAR | Status: DC | PRN
  Administered 2022-03-18 (×2): 15 mg via INTRAVENOUS

## 2022-03-18 MED ORDER — ONDANSETRON HCL 4 MG/2ML IJ SOLN
INTRAMUSCULAR | Status: AC
  Filled 2022-03-18: qty 2

## 2022-03-18 MED ORDER — BUPIVACAINE LIPOSOME 1.3 % IJ SUSP
INTRAMUSCULAR | Status: DC | PRN
  Administered 2022-03-18: 20 mL

## 2022-03-18 MED ORDER — METHOCARBAMOL 500 MG PO TABS
500.0000 mg | ORAL_TABLET | Freq: Three times a day (TID) | ORAL | Status: DC
  Administered 2022-03-18: 500 mg via ORAL
  Filled 2022-03-18: qty 1

## 2022-03-18 MED ORDER — ACETAMINOPHEN 500 MG PO TABS
1000.0000 mg | ORAL_TABLET | ORAL | Status: AC
  Administered 2022-03-18: 1000 mg via ORAL
  Filled 2022-03-18: qty 2

## 2022-03-18 MED ORDER — PROMETHAZINE HCL 25 MG/ML IJ SOLN: 12.5000 mg | Freq: Once | INTRAMUSCULAR | Status: AC

## 2022-03-18 MED ORDER — ROCURONIUM BROMIDE 10 MG/ML (PF) SYRINGE
PREFILLED_SYRINGE | INTRAVENOUS | Status: AC
  Filled 2022-03-18: qty 10

## 2022-03-18 MED ORDER — PROPOFOL 10 MG/ML IV BOLUS
INTRAVENOUS | Status: AC
  Filled 2022-03-18: qty 20

## 2022-03-18 MED ORDER — LIDOCAINE 2% (20 MG/ML) 5 ML SYRINGE
INTRAMUSCULAR | Status: DC | PRN
  Administered 2022-03-18: 60 mg via INTRAVENOUS

## 2022-03-18 MED ORDER — BUPIVACAINE LIPOSOME 1.3 % IJ SUSP
INTRAMUSCULAR | Status: AC
  Filled 2022-03-18: qty 20

## 2022-03-18 SURGICAL SUPPLY — 45 items
BAG COUNTER SPONGE SURGICOUNT (BAG) IMPLANT
BLADE HEX COATED 2.75 (ELECTRODE) ×1 IMPLANT
CHLORAPREP W/TINT 26 (MISCELLANEOUS) ×1 IMPLANT
COVER MAYO STAND STRL (DRAPES) ×1 IMPLANT
DRAPE LAPAROSCOPIC ABDOMINAL (DRAPES) ×1 IMPLANT
DRAPE UTILITY XL STRL (DRAPES) IMPLANT
DRAPE WARM FLUID 44X44 (DRAPES) ×1 IMPLANT
DRSG OPSITE POSTOP 4X10 (GAUZE/BANDAGES/DRESSINGS) IMPLANT
DRSG OPSITE POSTOP 4X6 (GAUZE/BANDAGES/DRESSINGS) IMPLANT
DRSG OPSITE POSTOP 4X8 (GAUZE/BANDAGES/DRESSINGS) IMPLANT
DRSG TELFA 3X8 NADH STRL (GAUZE/BANDAGES/DRESSINGS) IMPLANT
ELECT REM PT RETURN 15FT ADLT (MISCELLANEOUS) ×1 IMPLANT
GAUZE SPONGE 4X4 12PLY STRL (GAUZE/BANDAGES/DRESSINGS) ×1 IMPLANT
GLOVE BIO SURGEON STRL SZ 6.5 (GLOVE) ×2 IMPLANT
GLOVE BIOGEL PI IND STRL 7.0 (GLOVE) ×1 IMPLANT
GLOVE INDICATOR 6.5 STRL GRN (GLOVE) ×1 IMPLANT
GOWN STRL REUS W/ TWL XL LVL3 (GOWN DISPOSABLE) ×2 IMPLANT
GOWN STRL REUS W/TWL XL LVL3 (GOWN DISPOSABLE) ×2
HANDLE SUCTION POOLE (INSTRUMENTS) ×1 IMPLANT
HOLDER FOLEY CATH W/STRAP (MISCELLANEOUS) IMPLANT
KIT BASIN OR (CUSTOM PROCEDURE TRAY) ×1 IMPLANT
KIT TURNOVER KIT A (KITS) IMPLANT
MANIFOLD NEPTUNE II (INSTRUMENTS) ×1 IMPLANT
PACK GENERAL/GYN (CUSTOM PROCEDURE TRAY) ×1 IMPLANT
RELOAD PROXIMATE 75MM BLUE (ENDOMECHANICALS) ×1 IMPLANT
RELOAD STAPLE 75 3.8 BLU REG (ENDOMECHANICALS) IMPLANT
STAPLER GUN LINEAR PROX 60 (STAPLE) IMPLANT
STAPLER PROXIMATE 75MM BLUE (STAPLE) IMPLANT
SUCTION POOLE HANDLE (INSTRUMENTS) ×1
SUT NOVA NAB DX-16 0-1 5-0 T12 (SUTURE) ×1 IMPLANT
SUT NOVA NAB GS-21 0 18 T12 DT (SUTURE) IMPLANT
SUT PROLENE 2 0 BLUE (SUTURE) IMPLANT
SUT SILK 2 0 (SUTURE) ×1
SUT SILK 2 0 SH CR/8 (SUTURE) ×1 IMPLANT
SUT SILK 2-0 18XBRD TIE 12 (SUTURE) ×1 IMPLANT
SUT SILK 3 0 (SUTURE)
SUT SILK 3 0 SH CR/8 (SUTURE) ×1 IMPLANT
SUT SILK 3-0 18XBRD TIE 12 (SUTURE) ×1 IMPLANT
SUT VIC AB 2-0 SH 18 (SUTURE) IMPLANT
SUT VIC AB 2-0 SH 27 (SUTURE) ×1
SUT VIC AB 2-0 SH 27X BRD (SUTURE) ×2 IMPLANT
SUT VIC AB 4-0 PS2 18 (SUTURE) ×1 IMPLANT
TOWEL OR 17X26 10 PK STRL BLUE (TOWEL DISPOSABLE) ×2 IMPLANT
TOWEL OR NON WOVEN STRL DISP B (DISPOSABLE) ×2 IMPLANT
YANKAUER SUCT BULB TIP NO VENT (SUCTIONS) ×1 IMPLANT

## 2022-03-18 NOTE — Transfer of Care (Signed)
Immediate Anesthesia Transfer of Care Note  Patient: Jeremiah Allen  Procedure(s) Performed: LOOP ILEOSTOMY REVERSAL  Patient Location: PACU  Anesthesia Type:General  Level of Consciousness: drowsy  Airway & Oxygen Therapy: Patient Spontanous Breathing and Patient connected to face mask oxygen  Post-op Assessment: Report given to RN, Post -op Vital signs reviewed and stable, and Patient moving all extremities X 4  Post vital signs: Reviewed and stable  Last Vitals:  Vitals Value Taken Time  BP 132/77   Temp    Pulse 116 03/18/22 1640  Resp 23 03/18/22 1640  SpO2 100 % 03/18/22 1640  Vitals shown include unvalidated device data.  Last Pain:  Vitals:   03/18/22 1223  PainSc: 0-No pain         Complications: No notable events documented.

## 2022-03-18 NOTE — Anesthesia Preprocedure Evaluation (Addendum)
Anesthesia Evaluation  Patient identified by MRN, date of birth, ID band Patient awake    Reviewed: Allergy & Precautions, H&P , NPO status , Patient's Chart, lab work & pertinent test results  Airway Mallampati: II  TM Distance: >3 FB Neck ROM: Full    Dental no notable dental hx. (+) Teeth Intact, Dental Advisory Given   Pulmonary Current SmokerPatient did not abstain from smoking.   Pulmonary exam normal breath sounds clear to auscultation       Cardiovascular negative cardio ROS  Rhythm:Regular Rate:Normal     Neuro/Psych   Anxiety  Bipolar Disorder   negative neurological ROS     GI/Hepatic Neg liver ROS,GERD  Medicated,,  Endo/Other  negative endocrine ROS    Renal/GU negative Renal ROS  negative genitourinary   Musculoskeletal   Abdominal   Peds  Hematology negative hematology ROS (+)   Anesthesia Other Findings   Reproductive/Obstetrics negative OB ROS                             Anesthesia Physical Anesthesia Plan  ASA: 2  Anesthesia Plan: General   Post-op Pain Management: Tylenol PO (pre-op)* and Toradol IV (intra-op)*   Induction: Intravenous  PONV Risk Score and Plan: 2 and Ondansetron, Dexamethasone and Midazolam  Airway Management Planned: Oral ETT  Additional Equipment:   Intra-op Plan:   Post-operative Plan: Extubation in OR  Informed Consent: I have reviewed the patients History and Physical, chart, labs and discussed the procedure including the risks, benefits and alternatives for the proposed anesthesia with the patient or authorized representative who has indicated his/her understanding and acceptance.     Dental advisory given  Plan Discussed with: CRNA  Anesthesia Plan Comments:        Anesthesia Quick Evaluation

## 2022-03-18 NOTE — Anesthesia Postprocedure Evaluation (Signed)
Anesthesia Post Note  Patient: Jeremiah Allen  Procedure(s) Performed: LOOP ILEOSTOMY REVERSAL     Patient location during evaluation: PACU Anesthesia Type: General Level of consciousness: awake and alert Pain management: pain level controlled Vital Signs Assessment: post-procedure vital signs reviewed and stable Respiratory status: spontaneous breathing, nonlabored ventilation and respiratory function stable Cardiovascular status: blood pressure returned to baseline and stable Postop Assessment: no apparent nausea or vomiting Anesthetic complications: no  No notable events documented.  Last Vitals:  Vitals:   03/18/22 1745 03/18/22 1800  BP: 136/82 (!) 141/80  Pulse: (!) 52 (!) 54  Resp: 14 14  Temp:    SpO2: 97% 97%    Last Pain:  Vitals:   03/18/22 1800  PainSc: Asleep                 Laura Caldas,W. EDMOND

## 2022-03-18 NOTE — Op Note (Signed)
03/18/2022  4:31 PM  PATIENT:  Jeremiah Allen  37 y.o. male  Patient Care Team: Hayden Rasmussen, MD as PCP - General (Family Medicine) Clent Jacks, RN as Oncology Nurse Navigator [place], Mindi Slicker, RN as Oncology Nurse Navigator Leighton Ruff, MD as Consulting Physician (Colon and Rectal Surgery)  PRE-OPERATIVE DIAGNOSIS:  RECTAL CANCER, ILEOSTOMY STRICTURE  POST-OPERATIVE DIAGNOSIS:  RECTAL CANCER, ILEOSTOMY STRICTURE  PROCEDURE:  LOOP ILEOSTOMY REVERSAL   Surgeon(s): Leighton Ruff, MD  ASSISTANT: Ileana Roup, MD   ANESTHESIA:   general  EBL:105m  Total I/O In: 100 [IV Piggyback:100] Out: -   DRAINS: none   SPECIMEN:  Source of Specimen:  ostomy  DISPOSITION OF SPECIMEN:  PATHOLOGY  COUNTS:  YES  PLAN OF CARE: Admit to inpatient   PATIENT DISPOSITION:  PACU - hemodynamically stable.  INDICATION: 37y.o. M with rectal cancer status post low anterior resection with colorectal anastomosis and diverting ileostomy.  He has since healed his anastomosis without leak and he is having trouble with ileostomy dysfunction.  I recommended ileostomy reversal.   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  General anesthesia was induced without difficulty. SCDs were also noted to be in place prior to the initiation of anesthesia.  The patient was then prepped and draped in the usual sterile fashion.   A surgical timeout was performed indicating the correct patient, procedure, positioning and need for preoperative antibiotics.   I began by making an incision in the mucocutaneous junction using Bovie electrocautery.  Dissection was carried down through subcutaneous tissues to the level of the fascia.  The fascia was carefully separated from the loop  ileostomy using blunt dissection and sharp dissection.  We were able to enter into the peritoneum and the superior edge of the ostomy.  I was unable to take down the remaining attachments circumferentially.  I brought out the ileostomy and evaluated this.  There appeared to be a stricture at the level of the fascia causing some level of ischemia distally and partial obstruction.  The ostomy limbs were stapled using a 75 mm blue load GIA stapler.  Enterotomies were placed on the proximal and distal limbs and a 75 mm GIA stapler was used to create a new anastomosis.  The common enterotomy channel was closed using a 60 mm TA blue load stapler.  The edges of the staple line were imbricated using 3-0 silk sutures.  In and tied tension 3-0 silk suture was also placed at the crotch of the anastomosis.  The mesenteric defect was closed using a running 3-0 silk suture.  Hemostasis was good.  This was then placed back into the abdomen and the omentum was brought down over top of it.  The fascial edges were mobilized to allow for closure.  The fascia was then closed using interrupted #1 Novafil sutures.  Subcutaneous tissue was reapproximated using a 2-0 pursestring suture.  Dermal layer was partially closed using a 2-0 Vicryl pursestring suture.  A Telfa wick was placed in the middle of the wound.  S dressing was applied over this.  The patient was then awakened from anesthesia and sent to the postanesthesia care unit in stable condition.  All counts were correct per operating room staff.  ARosario Adie MD  Colorectal and  [place] [facility]

## 2022-03-18 NOTE — Anesthesia Procedure Notes (Signed)
Procedure Name: Intubation Date/Time: 03/18/2022 3:28 PM  Performed by: Niel Hummer, CRNAPre-anesthesia Checklist: Patient identified, Emergency Drugs available, Suction available and Patient being monitored Patient Re-evaluated:Patient Re-evaluated prior to induction Oxygen Delivery Method: Circle system utilized Preoxygenation: Pre-oxygenation with 100% oxygen Induction Type: IV induction Ventilation: Mask ventilation without difficulty Laryngoscope Size: Mac and 4 Grade View: Grade I Tube type: Oral Tube size: 7.5 mm Number of attempts: 1 Airway Equipment and Method: Stylet Placement Confirmation: ETT inserted through vocal cords under direct vision, positive ETCO2 and breath sounds checked- equal and bilateral Secured at: 22 cm Tube secured with: Tape Dental Injury: Teeth and Oropharynx as per pre-operative assessment

## 2022-03-18 NOTE — H&P (Signed)
HPI: Jeremiah Allen is an 37 y.o. 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    Past Surgical History:  Procedure Laterality Date   DIVERTING ILEOSTOMY N/A 01/15/2022   Procedure: DIVERTING ILEOSTOMY;  Surgeon: Leighton Ruff, MD;  Location: [facility];  Service: General;  Laterality: N/A;   FLEXIBLE SIGMOIDOSCOPY N/A 04/16/2021   Procedure: FLEXIBLE SIGMOIDOSCOPY;  Surgeon: Toledo, Benay Pike, MD;  Location: [facility] ENDOSCOPY;  Service: Gastroenterology;  Laterality: N/A;   IR IMAGING GUIDED PORT INSERTION  04/28/2021   LAPAROSCOPY N/A 08/04/2021   Procedure: STAGING LAPAROSCOPY WITH INTRAOPERATIVE ULTRASOUND;  Surgeon: Dwan Bolt, MD;  Location: [place];  Service: General;  Laterality: N/A;   OPEN PARTIAL HEPATECTOMY  N/A 08/04/2021   Procedure: OPEN PARTIAL HEPATECTOMY;  Surgeon: Dwan Bolt, MD;  Location: [place];  Service: General;  Laterality: N/A;   WISDOM TOOTH EXTRACTION     XI ROBOTIC ASSISTED LOWER ANTERIOR RESECTION N/A 01/15/2022   Procedure: XI ROBOTIC ASSISTED LOWER ANTERIOR RESECTION;  Surgeon: Leighton Ruff, MD;  Location: [facility];  Service: General;  Laterality: N/A;    Family History  Problem Relation Age of Onset   Heart disease Mother    Miscarriages / Stillbirths Sister    Asthma Sister    Stroke Sister        at birth   Developmental delay Brother     Social:  reports that he has been smoking cigarettes. He has a 2.50 pack-year smoking history. He has never used smokeless tobacco. He reports current drug use. Frequency: 3.00 times per week. Drug: Marijuana. He reports that he does not drink alcohol.  Allergies:  Allergies  Allergen Reactions   Crab Extract Allergy Skin Test Anaphylaxis and Swelling    Throat swelling (CRAB ALLERGY ONLY)   Dilaudid  [Hydromorphone] Itching and Other (See Comments)    Skin tingling sensations     Medications: I have reviewed the patient's current medications.  No results found for this or any previous visit (from the past 48 hour(s)).  No results found.  ROS - all of the below systems have been reviewed with the patient and positives are indicated with bold text General: chills, fever or night sweats Eyes: blurry vision or double vision ENT: epistaxis or sore throat Hematologic/Lymphatic: bleeding problems, blood clots or swollen lymph nodes Endocrine: temperature intolerance or unexpected weight changes Breast: new or changing breast lumps or nipple discharge Resp: cough, shortness of breath, or wheezing CV: chest pain or dyspnea on exertion GI: as per HPI GU: dysuria, trouble voiding, or hematuria Neuro: TIA or stroke symptoms    PE  Constitutional: NAD; conversant; no deformities Eyes: Moist conjunctiva; no lid lag; anicteric; PERRL Neck: Trachea midline; no thyromegaly Lungs: Normal respiratory effort CV: RRR GI: Abd soft MSK: Normal range of motion of extremities; no clubbing/cyanosis Psychiatric: Appropriate affect; alert and oriented x3     A/P: Jeremiah Allen is an 37 y.o. male with DLI s/p Colorectal anastomosis.  This has healed and he is ready for ostomy reversal.  Risks include bleeding, damage to adjacent structures, hernia and infection.  Pt agrees to proceed.      Rosario Adie, MD  Colorectal and [place] [facility]

## 2022-03-19 ENCOUNTER — Encounter (HOSPITAL_COMMUNITY): Payer: Self-pay | Admitting: General Surgery

## 2022-03-19 ENCOUNTER — Other Ambulatory Visit: Payer: Self-pay

## 2022-03-19 LAB — BASIC METABOLIC PANEL
Anion gap: 7 (ref 5–15)
BUN: 11 mg/dL (ref 6–20)
CO2: 20 mmol/L — ABNORMAL LOW (ref 22–32)
Calcium: 8 mg/dL — ABNORMAL LOW (ref 8.9–10.3)
Chloride: 105 mmol/L (ref 98–111)
Creatinine, Ser: 1.06 mg/dL (ref 0.61–1.24)
GFR, Estimated: >60 mL/min (ref >60)
Glucose, Bld: 129 mg/dL — ABNORMAL HIGH (ref 70–99)
Potassium: 4.2 mmol/L (ref 3.5–5.1)
Sodium: 132 mmol/L — ABNORMAL LOW (ref 135–145)

## 2022-03-19 LAB — CBC
HCT: 31.2 % — ABNORMAL LOW (ref 39.0–52.0)
Hemoglobin: 10.4 g/dL — ABNORMAL LOW (ref 13.0–17.0)
MCH: 31.2 pg (ref 26.0–34.0)
MCHC: 33.3 g/dL (ref 30.0–36.0)
MCV: 93.7 fL (ref 80.0–100.0)
Platelets: 238 10*3/uL (ref 150–400)
RBC: 3.33 MIL/uL — ABNORMAL LOW (ref 4.22–5.81)
RDW: 11.9 % (ref 11.5–15.5)
WBC: 10.1 10*3/uL (ref 4.0–10.5)
nRBC: 0 % (ref 0.0–0.2)

## 2022-03-19 MED ORDER — KETOROLAC TROMETHAMINE 30 MG/ML IJ SOLN
30.0000 mg | Freq: Four times a day (QID) | INTRAMUSCULAR | Status: AC
  Administered 2022-03-19 – 2022-03-20 (×8): 30 mg via INTRAVENOUS
  Filled 2022-03-19 (×8): qty 1

## 2022-03-19 MED ORDER — KCL IN DEXTROSE-NACL 20-5-0.45 MEQ/L-%-% IV SOLN
INTRAVENOUS | Status: DC
  Filled 2022-03-19 (×2): qty 1000

## 2022-03-19 MED ORDER — METHOCARBAMOL 1000 MG/10ML IJ SOLN
500.0000 mg | Freq: Three times a day (TID) | INTRAVENOUS | Status: DC
  Administered 2022-03-19 – 2022-03-20 (×4): 500 mg via INTRAVENOUS
  Filled 2022-03-19 (×4): qty 500

## 2022-03-19 NOTE — TOC Progression Note (Signed)
Transition of Care [facility]) - Progression Note    Patient Details  Name: Jeremiah Allen MRN: [id number] Date of Birth: [birth year]  Transition of Care [facility]) CM/SW [place], RN Phone Number:[phone]  03/19/2022, 3:13 PM  Clinical Narrative:     Transition of Care [facility]) Screening Note   Patient Details  Name: Jeremiah Allen Date of Birth: [birth year]   Transition of Care [facility]) CM/SW Contact:    Angelita Ingles, RN Phone Number: 03/19/2022, 3:13 PM    Transition of Care Department [facility]) has reviewed patient and no TOC needs have been identified at this time. We will continue to monitor patient advancement through interdisciplinary progression rounds. If new patient transition needs arise, please place a TOC consult.          Expected Discharge Plan and Services                                               Social Determinants of Health (SDOH) Interventions SDOH Screenings   Food Insecurity: No Food Insecurity (03/19/2022)  Housing: Low Risk  (03/19/2022)  Transportation Needs: No Transportation Needs (03/19/2022)  Utilities: Not At Risk (03/19/2022)  Depression (PHQ2-9): Medium Risk (04/29/2021)  Financial Resource Strain: Medium Risk (04/29/2021)  Physical Activity: Inactive (04/29/2021)  Social Connections: Moderately Isolated (04/29/2021)  Stress: Stress Concern Present (04/29/2021)  Tobacco Use: High Risk (03/19/2022)    Readmission Risk Interventions     No data to display

## 2022-03-19 NOTE — Progress Notes (Signed)
1 Day Post-Op  Subjective: Having a lot of nausea and pain  Objective: Vital signs in last 24 hours: Temp:  [97.4 F (36.3 C)-98.7 F (37.1 C)] 98.5 F (36.9 C) (02/15 0536) Pulse Rate:  [52-88] 67 (02/15 0536) Resp:  [10-23] 17 (02/15 0536) BP: (104-145)/(70-86) 125/72 (02/15 0536) SpO2:  [96 %-100 %] 100 % (02/15 0536) Weight:  [75 kg] 75 kg (02/15 0500)   Intake/Output from previous day: 02/14 0701 - 02/15 0700 In: 1405 [P.O.:30; I.V.:1150; IV Piggyback:225] Out: 355 [Urine:350; Blood:5] Intake/Output this shift: No intake/output data recorded.   General appearance: alert and cooperative GI: nondistended  Incision: no significant drainage  Lab Results:  Recent Labs    03/19/22 0426  WBC 10.1  HGB 10.4*  HCT 31.2*  PLT 238   BMET Recent Labs    03/19/22 0426  NA 132*  K 4.2  CL 105  CO2 20*  GLUCOSE 129*  BUN 11  CREATININE 1.06  CALCIUM 8.0*   PT/INR No results for input(s): "LABPROT", "INR" in the last 72 hours. ABG No results for input(s): "PHART", "HCO3" in the last 72 hours.  Invalid input(s): "PCO2", "PO2"  MEDS, Scheduled  acetaminophen  1,000 mg Oral Q6H   alvimopan  12 mg Oral BID   enoxaparin (LOVENOX) injection  40 mg Subcutaneous Q24H   feeding supplement  237 mL Oral BID BM   gabapentin  300 mg Oral BID   hydrOXYzine  25 mg Oral TID   ketorolac  30 mg Intravenous Q6H   saccharomyces boulardii  250 mg Oral BID    Studies/Results: No results found.  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

## 2022-03-20 MED ORDER — OXYCODONE HCL 5 MG PO TABS
5.0000 mg | ORAL_TABLET | Freq: Four times a day (QID) | ORAL | Status: DC | PRN
  Administered 2022-03-20 – 2022-03-21 (×2): 10 mg via ORAL
  Filled 2022-03-20 (×2): qty 2

## 2022-03-20 MED ORDER — METHOCARBAMOL 500 MG PO TABS
500.0000 mg | ORAL_TABLET | Freq: Three times a day (TID) | ORAL | Status: DC | PRN
  Administered 2022-03-20: 500 mg via ORAL
  Filled 2022-03-20: qty 1

## 2022-03-20 NOTE — Progress Notes (Signed)
Pharmacy Brief Note - Alvimopan (Entereg)  The standing order set for alvimopan (Entereg) now includes an automatic order to discontinue the drug after the patient has had a bowel movement. The change was approved by the [place] and the Medical Executive Committee.  This patient has had a bowel movement documented by nursing. Therefore, alvimopan has been discontinued. If there are questions, please contact the pharmacy at [phone].  Thank you  Reuel Boom, PharmD, BCPS [phone] 03/20/2022, 12:59 PM

## 2022-03-20 NOTE — Progress Notes (Signed)
2 Days Post-Op  Subjective: Having much less nausea and pain  Objective: Vital signs in last 24 hours: Temp:  [97.7 F (36.5 C)-98.1 F (36.7 C)] 97.8 F (36.6 C) (02/16 0548) Pulse Rate:  [66-97] 71 (02/16 0548) Resp:  [16-17] 17 (02/16 0548) BP: (105-120)/(59-64) 111/60 (02/16 0548) SpO2:  [96 %-100 %] 100 % (02/16 0548) Weight:  [73.5 kg] 73.5 kg (02/16 0500)   Intake/Output from previous day: 02/15 0701 - 02/16 0700 In: 1674.9 [P.O.:60; I.V.:1400; IV Piggyback:215] Out: -  Intake/Output this shift: No intake/output data recorded.   General appearance: alert and cooperative GI: nondistended  Incision: no significant drainage, wick removed  Lab Results:  Recent Labs    03/19/22 0426  WBC 10.1  HGB 10.4*  HCT 31.2*  PLT 238    BMET Recent Labs    03/19/22 0426  NA 132*  K 4.2  CL 105  CO2 20*  GLUCOSE 129*  BUN 11  CREATININE 1.06  CALCIUM 8.0*    PT/INR No results for input(s): "LABPROT", "INR" in the last 72 hours. ABG No results for input(s): "PHART", "HCO3" in the last 72 hours.  Invalid input(s): "PCO2", "PO2"  MEDS, Scheduled  acetaminophen  1,000 mg Oral Q6H   alvimopan  12 mg Oral BID   enoxaparin (LOVENOX) injection  40 mg Subcutaneous Q24H   feeding supplement  237 mL Oral BID BM   gabapentin  300 mg Oral BID   hydrOXYzine  25 mg Oral TID   ketorolac  30 mg Intravenous Q6H   saccharomyces boulardii  250 mg Oral BID    Studies/Results: No results found.  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

## 2022-03-20 NOTE — Discharge Instructions (Signed)
ABDOMINAL SURGERY: POST OP INSTRUCTIONS   Diet: Since many people have frequent bowel movements right after the operation, following a diet can help reduce the number of stools.  Incorporate new foods into your diet one at a time to see how they affect your stool output. You may find that some foods give you trouble initially, but that you can tolerate them better later. Do not skip meals. This tends to make the stools more irritating and loose. Balancing starches with foods that tend to give diarrhea is also helpful in controlling the frequency of bowel movements. Once your colon has been removed, you will need more salt until your body adjusts to not having a colon. Pretzels and corn chips are good snacks. Hot and spicy foods will probably burn on the way out and should be avoided if your anal area feels irritated. Seeds and nuts also can be irritating. Within three to nine months after surgery, your body will have started to adjust to having it's new configuration. At this point, you should try to eat all types of foods and see how they affect you. Some patients have tried a low glycemic index diet, which includes foods that are high in fiber and cause a slow rise in blood sugar, to help control their bowel movements. Resources on the Internet and at your [employer] are available for more information on this type of diet.  Fluids are important to prevent dehydration. Drink enough fluid so your urine is light yellow in color. Avoid fruit juices, carbonated beverages, drinks with caffeine and straws (swallowed air increases gas). Rather than drinking fluids with your meal, try drinking fluids at the end of your meal, which may help slow down your stool.  Many people ask about vitamin supplements after their operation when they are limiting fruits and vegetables. It is fine to take vitamins, but they should be chewable or in liquid form, otherwise they may not be fully absorbed.   Take your usually  prescribed home medications unless otherwise directed. PAIN CONTROL: Pain is best controlled by a usual combination of three different methods TOGETHER: Ice/Heat Over the counter pain medication Prescription pain medication Most patients will experience some swelling and bruising around the incisions.  Ice packs or heating pads (30-60 minutes up to 6 times a day) will help. Use ice for the first few days to help decrease swelling and bruising, then switch to heat to help relax tight/sore spots and speed recovery.  Some people prefer to use ice alone, heat alone, alternating between ice & heat.  Experiment to what works for you.  Swelling and bruising can take several weeks to resolve.   It is helpful to take an over-the-counter pain medication regularly for the first few weeks.  Choose one of the following that works best for you: Naproxen (Aleve, etc)  Two 278m tabs twice a day Ibuprofen (Advil, etc) Three 2090mtabs four times a day (every meal & bedtime) Acetaminophen (Tylenol, etc) 500-65082mour times a day (every meal & bedtime) A  prescription for pain medication (such as oxycodone, hydrocodone, etc) should be given to you upon discharge.  Take your pain medication as prescribed.  If you are having problems/concerns with the prescription medicine (does not control pain, nausea, vomiting, rash, itching, etc), please call us [phone] see if we need to switch you to a different pain medicine that will work better for you and/or control your side effect better. If you need a refill on your  pain medication, please contact your pharmacy.  They will contact our office to request authorization. Prescriptions will not be filled after 5 pm or on week-ends. Avoid getting constipated.  Between the surgery and the pain medications, it is common to experience some constipation.  Increasing fluid intake and taking a fiber supplement (such as Metamucil, Citrucel, FiberCon, MiraLax, etc) 1-2 times a  day regularly will usually help prevent this problem from occurring.  A mild laxative (prune juice, Milk of Magnesia, MiraLax, etc) should be taken according to package directions if there are no bowel movements after 48 hours.   Watch out for diarrhea.  If you have many loose bowel movements, simplify your diet to bland foods & liquids for a few days.  Stop any stool softeners and decrease your fiber supplement.  Switching to mild anti-diarrheal medications (Kayopectate, Pepto Bismol) can help.  If this worsens or does not improve, please call us.  Stool Frequency  As the new reservoir adapts and stretches to its normal capacity, the number of stools you have per day should decrease. You will probably have many stools while you are in the hospital, but these should lessen by the time you go home. Often the first stools are without your control while in the hospital, though this is resolved before you go home. The biggest increase in stools usually occurs after you begin eating. You can expect your number of bowel movements to decrease gradually. The stools will range from watery to a paste-like consistency. By watching your diet, you can avoid foods that tend to produce loose stools. The more frequent your bowel movements, the more itching and burning you will have around your anus. A combined approach of diet and medications, such as Imodium and Lomotil, may help decrease your number of bowel movements. Most people need to take these medications more frequently right after having their ileoanal reservoir procedure, but only use them occasionally as their reservoir function improves. For some people, adding a fiber supplement, such as Metamucil, Benefiber, Konsyl, Citracel or a generic equivalent, and taking them with half the recommended amount of water, can help thicken their stool and reduce the number of bowel movements.  Nighttime Stool Frequency: Stool frequency at night is a common problem and can  be very disruptive of a good night's sleep. More than half of patients surveyed -- about 63 percent -- get up once or twice per night to pass stool, and about 24 percent of patients awaken three times or more during the night to have a bowel movement. This can be related to eating late, overeating or eating foods known to cause problems. Tips for decreasing the number of stools at night include: - Don't eat late. Wait at least three to four hours after your last meal before going to bed.  - Take an anti-diarrheal medication before going to bed.  - Eat binding foods at dinner and avoid those foods that tend to cause diarrhea.  - Don't overeat at dinner. Diarrhea: Diarrhea occurs when your stool is very watery and more frequent than usual.  Diarrhea can be caused by eating certain types of foods, eating too much of any food, pouchitis and other illnesses like the flu. If you develop the flu early in your recovery and have diarrhea, you may become dehydrated and need to be hospitalized.  Sometimes people have diarrhea because they don't have the enzyme called lactase needed to digest the sugar in milk products. If you feel bloated and have cramps  and gas after drinking milk, try not drinking it and see if symptoms go away. You can also try fermented milk products, such as yogurt, hard cheese and buttermilk, as well as soy or goat's milk. Adding fiber supplements, such as Metamucil, Benefiber, Konsyl, Citracel or the generic equivalent, can help thicken your stool and reduce diarrhea. Gatorade or other sport drinks can also be helpful in treating diarrhea by keeping you hydrated.  Skin Care Until your body adjusts to your new internal pouch, you may experience some leakage of stool, especially at night. Liquid stool is very irritating to the skin around the anus, so it is important to keep this area clean and dry. Use soft, white, non-perfumed toilet tissue to blot gently after each bowel movement, drying the  skin completely. Do not scratch, rub or wipe the skin. It may help to spray water from a squirt bottle. Some people find drying the skin with a blow dryer helps.  For irritated skin around the anus, warm baths are very soothing. Do not apply anything to the skin that burns or irritates. Moist cotton balls may be better for wiping if your skin is very sore. Some people use baby wipes to cleanse themselves. Use a brand without alcohol or scents. Some people tuck a small piece of toilet tissue or a dry cotton ball near the anal opening to protect their skin from small amounts of leakage. Scented soaps and tissues should not be used since these may cause irritation. A protective ointment may be used after each bowel movement to keep the stool off the skin. You may want to wear a protective pad to keep your clothing clean. Anal itching and burning are often not visible on the outside because the irritated area is actually on the inside. You can look with a mirror to see if your irritation is on the outside skin. Some of the itching and burning is a normal part of the internal healing and will go away in time. Some people have found Pepto Bismol taken by mouth is helpful for the burning. Sometimes people develop a yeast infection around the anal area. It happens more commonly when people are taking antibiotics, or in women around the time of their period. If you get an itchy red rash, you may need an anti-fungal cream or ointment. This is available over the counter. Occasionally people are allergic to the preparations they are using around their anus. This is not very common. Some products contain lanolin -- if you are allergic to this, check the ingredients of ointments before using.  Incontinence: Mild incontinence or leakage of stool is a common problem that improves with time as your stool thickens, your pouch stretches and your sphincters become stronger. The incontinence is usually worse at night when your  sphincters relax. The looser the stool, the more likely it is to leak. Some people accidentally pass stool when they are passing gas. In time, most people develop an awareness of whether they are passing stool or gas, but initially this can be a problem. It is also important to note that if you develop incontinence later on, you may have pouchitis. Medications, diet and careful skin care are all important during this period of adaptation. A small pad helps absorb leakage. Some people find sleeping on a small towel or pad also is helpful. Before your operation, it is a good idea to buy more cotton underwear.   Wash / shower every day.  You may shower over the  incision / wound.  Avoid baths until the skin is fully healed.  Continue to shower over incision(s) after the dressing is off. Remove your dressing daily and clean wound in shower.  You should replace the dressing or use a Band-Aid to cover the incision while it heals. ACTIVITIES as tolerated:   You may resume regular (light) daily activities beginning the next day--such as daily self-care, walking, climbing stairs--gradually increasing activities as tolerated.  If you can walk 30 minutes without difficulty, it is safe to try more intense activity such as jogging, treadmill, bicycling, low-impact aerobics, swimming, etc. Save the most intensive and strenuous activity for last such as sit-ups, heavy lifting, contact sports, etc  Refrain from any heavy lifting or straining until you are off narcotics for pain control.   DO NOT PUSH THROUGH PAIN.  Let pain be your guide: If it hurts to do something, don't do it.  Pain is your body warning you to avoid that activity for another week until the pain goes down. You may drive when you are no longer taking prescription pain medication, you can comfortably wear a seatbelt, and you can safely maneuver your car and apply brakes. You may have sexual intercourse when it is comfortable.  FOLLOW UP in our  office Please call [facility] at [phone] to set up an appointment to see your surgeon in the office for a follow-up appointment approximately 1-2 weeks after your surgery. Make sure that you call for this appointment the day you arrive home to insure a convenient appointment time. 10. IF YOU HAVE DISABILITY OR FAMILY LEAVE FORMS, BRING THEM TO THE OFFICE FOR PROCESSING.  DO NOT GIVE THEM TO YOUR DOCTOR.   WHEN TO CALL us [phone]: Poor pain control Reactions / problems with new medications (rash/itching, nausea, etc)  Fever over 101.5 F (38.5 C) Inability to urinate Nausea and/or vomiting Worsening swelling or bruising Continued bleeding from incision. Increased pain, redness, or drainage from the incision  The clinic staff is available to answer your questions during regular business hours (8:30am-5pm).  Please don't hesitate to call and ask to speak to one of our nurses for clinical concerns.   A surgeon from [facility] is always on call at the hospitals   If you have a medical emergency, go to the nearest emergency room or call 911.    [facility], [place], [place], [place], [place]  [address] ? MAIN: [phone] ? TOLL FREE: [phone] ? FAX [phone] [email or web address]

## 2022-03-21 ENCOUNTER — Other Ambulatory Visit (HOSPITAL_BASED_OUTPATIENT_CLINIC_OR_DEPARTMENT_OTHER): Payer: Self-pay

## 2022-03-21 MED ORDER — OXYCODONE HCL 5 MG PO TABS
5.0000 mg | ORAL_TABLET | Freq: Four times a day (QID) | ORAL | 0 refills | Status: DC | PRN
  Filled 2022-03-21: qty 10, 3d supply, fill #0

## 2022-03-21 NOTE — Discharge Summary (Signed)
Physician Discharge Summary  Patient ID: Bosten Fagot MRN: [id number] DOB/AGE: 1985/03/26 37 y.o.  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 Having bowel function, pain controlled, ready for dc  Consults: None  Significant Diagnostic Studies: none  Treatments: surgery: ileostomy reversal  Discharge Exam: Blood pressure 118/67, pulse 67, temperature 97.7 F (36.5 C), temperature source Oral, resp. rate 18, weight 73.5 kg, SpO2 99 %. Ab soft approp tender  Disposition: Discharge disposition: 01-Home or Self Care        Allergies as of 03/21/2022       Reactions   Crab Extract Allergy Skin Test Anaphylaxis, Swelling   Throat swelling (CRAB ALLERGY ONLY)   Dilaudid [hydromorphone] Itching, Other (See Comments)   Skin tingling sensations         Medication List     TAKE these medications    acetaminophen 500 MG tablet Commonly known as: TYLENOL Take 500-1,000 mg by mouth every 6 (six) hours as needed for mild pain or moderate pain.   albuterol 108 (90 Base) MCG/ACT inhaler Commonly known as: VENTOLIN HFA Inhale 1-2 puffs into the lungs every 6 (six) hours as needed for wheezing or shortness of breath.   famotidine 20 MG tablet Commonly known as: PEPCID Take 20 mg by mouth 2 (two) times daily as needed for heartburn or indigestion.   hydrOXYzine 25 MG tablet Commonly known as: ATARAX Take 25 mg by mouth 3 (three) times daily.   methocarbamol 500 MG tablet Commonly known as: ROBAXIN Take 1 tablet (500 mg total) by mouth 3 (three) times daily as needed for up to 10 days   oxyCODONE 5 MG immediate release tablet Commonly known as: Oxy IR/ROXICODONE Take 1 tablet (5 mg total) by mouth every 6 (six) hours as needed for moderate pain, severe pain or breakthrough  pain.        Follow-up Information     Leighton Ruff, MD. Schedule an appointment as soon as possible for a visit in 2 week(s).   Specialties: General Surgery, Colon and Rectal Surgery Contact information: [address] [place] [id number] [phone]                 Signed: Rolm Bookbinder 03/21/2022, 8:25 AM

## 2022-03-21 NOTE — Plan of Care (Signed)

## 2022-03-23 LAB — SURGICAL PATHOLOGY

## 2022-03-24 ENCOUNTER — Other Ambulatory Visit (HOSPITAL_BASED_OUTPATIENT_CLINIC_OR_DEPARTMENT_OTHER): Payer: Self-pay

## 2022-03-24 MED ORDER — OXYCODONE HCL 5 MG PO TABS
5.0000 mg | ORAL_TABLET | Freq: Four times a day (QID) | ORAL | 0 refills | Status: DC | PRN
  Filled 2022-03-24: qty 30, 8d supply, fill #0

## 2022-03-25 ENCOUNTER — Other Ambulatory Visit (HOSPITAL_BASED_OUTPATIENT_CLINIC_OR_DEPARTMENT_OTHER): Payer: Self-pay

## 2022-03-25 ENCOUNTER — Inpatient Hospital Stay: Payer: Medicaid Other

## 2022-03-25 ENCOUNTER — Inpatient Hospital Stay (HOSPITAL_BASED_OUTPATIENT_CLINIC_OR_DEPARTMENT_OTHER): Payer: Medicaid Other | Admitting: Nurse Practitioner

## 2022-03-25 ENCOUNTER — Inpatient Hospital Stay: Payer: Medicaid Other | Attending: Oncology

## 2022-03-25 ENCOUNTER — Encounter: Payer: Self-pay | Admitting: Nurse Practitioner

## 2022-03-25 VITALS — BP 115/73 | HR 82 | Temp 98.1°F | Resp 20 | Ht 69.0 in | Wt 161.0 lb

## 2022-03-25 DIAGNOSIS — C787 Secondary malignant neoplasm of liver and intrahepatic bile duct: Secondary | ICD-10-CM

## 2022-03-25 DIAGNOSIS — I808 Phlebitis and thrombophlebitis of other sites: Secondary | ICD-10-CM | POA: Insufficient documentation

## 2022-03-25 DIAGNOSIS — C20 Malignant neoplasm of rectum: Secondary | ICD-10-CM | POA: Diagnosis not present

## 2022-03-25 DIAGNOSIS — D649 Anemia, unspecified: Secondary | ICD-10-CM | POA: Diagnosis not present

## 2022-03-25 DIAGNOSIS — R110 Nausea: Secondary | ICD-10-CM | POA: Diagnosis not present

## 2022-03-25 DIAGNOSIS — Z95828 Presence of other vascular implants and grafts: Secondary | ICD-10-CM

## 2022-03-25 LAB — CBC WITH DIFFERENTIAL (CANCER CENTER ONLY)
Abs Immature Granulocytes: 0 10*3/uL (ref 0.00–0.07)
Basophils Absolute: 0 10*3/uL (ref 0.0–0.1)
Basophils Relative: 0 %
Eosinophils Absolute: 0 10*3/uL (ref 0.0–0.5)
Eosinophils Relative: 1 %
HCT: 25.3 % — ABNORMAL LOW (ref 39.0–52.0)
Hemoglobin: 8.5 g/dL — ABNORMAL LOW (ref 13.0–17.0)
Immature Granulocytes: 0 %
Lymphocytes Relative: 24 %
Lymphs Abs: 0.9 10*3/uL (ref 0.7–4.0)
MCH: 31.5 pg (ref 26.0–34.0)
MCHC: 33.6 g/dL (ref 30.0–36.0)
MCV: 93.7 fL (ref 80.0–100.0)
Monocytes Absolute: 0.4 10*3/uL (ref 0.1–1.0)
Monocytes Relative: 11 %
Neutro Abs: 2.2 10*3/uL (ref 1.7–7.7)
Neutrophils Relative %: 64 %
Platelet Count: 252 10*3/uL (ref 150–400)
RBC: 2.7 MIL/uL — ABNORMAL LOW (ref 4.22–5.81)
RDW: 12.8 % (ref 11.5–15.5)
WBC Count: 3.5 10*3/uL — ABNORMAL LOW (ref 4.0–10.5)
nRBC: 0 % (ref 0.0–0.2)

## 2022-03-25 LAB — CMP (CANCER CENTER ONLY)
ALT: 6 U/L (ref 0–44)
AST: 15 U/L (ref 15–41)
Albumin: 4 g/dL (ref 3.5–5.0)
Alkaline Phosphatase: 72 U/L (ref 38–126)
Anion gap: 8 (ref 5–15)
BUN: 6 mg/dL (ref 6–20)
CO2: 28 mmol/L (ref 22–32)
Calcium: 9.2 mg/dL (ref 8.9–10.3)
Chloride: 103 mmol/L (ref 98–111)
Creatinine: 0.79 mg/dL (ref 0.61–1.24)
GFR, Estimated: >60 mL/min (ref >60)
Glucose, Bld: 92 mg/dL (ref 70–99)
Potassium: 3.9 mmol/L (ref 3.5–5.1)
Sodium: 139 mmol/L (ref 135–145)
Total Bilirubin: 0.6 mg/dL (ref 0.3–1.2)
Total Protein: 6.8 g/dL (ref 6.5–8.1)

## 2022-03-25 LAB — CEA (ACCESS): CEA (CHCC): 1.78 ng/mL (ref 0.00–5.00)

## 2022-03-25 LAB — MAGNESIUM: Magnesium: 2 mg/dL (ref 1.7–2.4)

## 2022-03-25 MED ORDER — HEPARIN SOD (PORK) LOCK FLUSH 100 UNIT/ML IV SOLN
500.0000 [IU] | Freq: Once | INTRAVENOUS | Status: AC
  Administered 2022-03-25: 500 [IU] via INTRAVENOUS

## 2022-03-25 MED ORDER — PROMETHAZINE HCL 12.5 MG PO TABS
12.5000 mg | ORAL_TABLET | Freq: Three times a day (TID) | ORAL | 0 refills | Status: DC | PRN
  Filled 2022-03-25: qty 10, 3d supply, fill #0
  Filled 2022-03-25: qty 10, 4d supply, fill #0

## 2022-03-25 MED ORDER — SODIUM CHLORIDE 0.9% FLUSH
10.0000 mL | INTRAVENOUS | Status: DC | PRN
  Administered 2022-03-25: 10 mL via INTRAVENOUS

## 2022-03-25 NOTE — Patient Instructions (Signed)

## 2022-03-25 NOTE — Progress Notes (Unsigned)
[name] OFFICE PROGRESS NOTE   Diagnosis: Rectal cancer  INTERVAL HISTORY:   Jeremiah Allen returns as scheduled.  He underwent loop ileostomy reversal 03/18/2022.  He is having abdominal pain which he relates to the surgery.  He is taking oxycodone every 4-6 hours as needed.  He reports nausea since the surgery.  Compazine and Zofran are not effective.  In the hospital he had Phenergan which did help.  He reports a painful superficial vein on the right arm at the site of an IV/lab draw while he was in the hospital.  Objective:  Vital signs in last 24 hours:  Blood pressure 115/73, pulse 82, temperature 98.1 F (36.7 C), temperature source Oral, resp. rate 20, height 5' 9"$  (1.753 m), weight 161 lb (73 kg), SpO2 100 %.    Resp: Lungs clear bilaterally. Cardio: Regular rate and rhythm. GI: Abdomen is soft.  Marked tenderness at the right mid to upper abdomen.  Ostomy reversal site with an area of superficial opening at the midpoint of the incision.  No surrounding erythema. Vascular: No arm or leg edema.  Superficial phlebitis right forearm. Port-A-Cath without erythema.  Lab Results:  Lab Results  Component Value Date   WBC 3.5 (L) 03/25/2022   HGB 8.5 (L) 03/25/2022   HCT 25.3 (L) 03/25/2022   MCV 93.7 03/25/2022   PLT 252 03/25/2022   NEUTROABS 2.2 03/25/2022    Imaging:  No results found.  Medications: I have reviewed the patient's current medications.  Assessment/Plan: Rectal cancer-clinical stage IV(T4aN2, M1) Sigmoidoscopy 04/16/2021-mass at 4 cm from the anal verge, invasive moderately differentiated adenocarcinoma with lymphovascular invasion OmniSeq-MSS, tumor mutation burden 3.9, PD-L1 less than 1%, K-ras G12 D CT angio abdomen/pelvis-bulky irregular rectal wall thickening with perirectal lymphadenopathy and 2 indeterminate liver lesions MRI liver 04/17/2021-3 discrete right liver metastases MRI pelvis 04/17/2021-T4aN2 rectal tumor-10 cm from the  anal verge, involvement of left anterior peritoneal reflection, numerous mesorectal lymph nodes within the pelvis, no evidence of extra mesorectal adenopathy greater than 10 nodes in the mesorectum CT chest 04/18/2021-no evidence of metastatic disease Elevated CEA (14.5 on 04/17/2021) Cycle 1 FOLFOX 04/30/2021 Cycle 2 FOLFOX 05/14/2021 Cycle 3 FOLFOX 05/28/2021, Udenyca Cycle 4 FOLFOX 06/11/2021, prophylactic dexamethasone added for delayed nausea, Udenyca Cycle 5 FOLFOX 06/25/2021, Udenyca Cycle 6 FOLFOX 07/08/2021, Udenyca CTs 07/17/2021-decrease in size of hepatic lesions, decreased rectal wall thickening, decrease size of perirectal lymph nodes, lingular consolidation consistent with infectious/inflammatory process 08/04/2021-right liver metastectomy procedures, segment 6, 7, 8 lesions removed.  The pathology from the segment 6 and segment 8 lesions revealed no residual carcinoma, the segment 7 lesion revealed pools of mucin and associated calcifications with rare atypical cells-partial to complete response, extensive thermal/mechanical artifact with focal ink on mucin, margin interpreted as negative, though a positive margin could not be excluded Cycle 7 FOLFOX 08/27/2021 Cycle 8 FOLFOX 09/10/2021 Radiation/Xeloda 09/30/2021-11/07/2021 CTs 01/05/2022-multiple areas of hepatic wedge resection, no new or progressive disease, unchanged mid to high rectal thickening Robotic low anterior resection/ileostomy 01/15/2022-no residual carcinoma consistent with complete response, fibrosis consistent with treatment effect, negative margins, 0/15 nodes Loop ileostomy reversal 03/18/2021 Rectal bleeding and pain secondary to #1 yT0, ypN0 Chronic left sciatica Bipolar disorder Right testicular fullness noted on exam 04/21/2021-epididymal cyst? 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  He is recovering from the  ileostomy reversal surgery completed a week ago.  Plan for restaging CT scans in 3 months.  We reviewed the CBC from today.  He is anemic, likely related to recent surgery.  He will return for follow-up CBC in 2 weeks.  He will contact the office in the interim with signs/symptoms suggestive of progressive anemia.  He has an area of superficial phlebitis at the site of a recent IV at the right forearm.  He will apply warm compresses.  He will contact the office if the arm becomes edematous.  For nausea he would like to try Phenergan.  Prescription sent to his pharmacy for Phenergan 12.5 mg every 8 hours as needed.  We discussed the sedation associated with Phenergan.  He will return for a port flush in 6 weeks.  Labs and CT scans in 12 weeks.  He will be seen in follow-up a few days after CT scans.  Patient seen with Dr. Benay Spice.   Ned Card ANP/GNP-BC   03/25/2022  2:05 PM This was a shared visit with Ned Card.  Jeremiah Allen was interviewed and examined.  He has gone ileostomy reversal surgery.  He is in remission from rectal cancer.  He has an area of superficial phlebitis at right arm IV site.  He will return for valence imaging and an office visit in 3 months.  I was present for greater than 50% of today's visit.  I performed medical decision making.  Julieanne Manson, MD

## 2022-04-06 ENCOUNTER — Other Ambulatory Visit (HOSPITAL_BASED_OUTPATIENT_CLINIC_OR_DEPARTMENT_OTHER): Payer: Self-pay

## 2022-04-06 MED ORDER — METHOCARBAMOL 500 MG PO TABS
500.0000 mg | ORAL_TABLET | Freq: Three times a day (TID) | ORAL | 0 refills | Status: DC
  Filled 2022-04-06: qty 30, 10d supply, fill #0

## 2022-04-08 ENCOUNTER — Telehealth: Payer: Self-pay

## 2022-04-08 ENCOUNTER — Inpatient Hospital Stay: Payer: Medicaid Other | Attending: Nurse Practitioner

## 2022-04-08 DIAGNOSIS — C20 Malignant neoplasm of rectum: Secondary | ICD-10-CM | POA: Insufficient documentation

## 2022-04-08 DIAGNOSIS — C787 Secondary malignant neoplasm of liver and intrahepatic bile duct: Secondary | ICD-10-CM | POA: Diagnosis not present

## 2022-04-08 LAB — CBC WITH DIFFERENTIAL (CANCER CENTER ONLY)
Abs Immature Granulocytes: 0.01 10*3/uL (ref 0.00–0.07)
Basophils Absolute: 0 10*3/uL (ref 0.0–0.1)
Basophils Relative: 1 %
Eosinophils Absolute: 0.1 10*3/uL (ref 0.0–0.5)
Eosinophils Relative: 2 %
HCT: 36.1 % — ABNORMAL LOW (ref 39.0–52.0)
Hemoglobin: 11.8 g/dL — ABNORMAL LOW (ref 13.0–17.0)
Immature Granulocytes: 0 %
Lymphocytes Relative: 31 %
Lymphs Abs: 1.2 10*3/uL (ref 0.7–4.0)
MCH: 30.3 pg (ref 26.0–34.0)
MCHC: 32.7 g/dL (ref 30.0–36.0)
MCV: 92.8 fL (ref 80.0–100.0)
Monocytes Absolute: 0.3 10*3/uL (ref 0.1–1.0)
Monocytes Relative: 8 %
Neutro Abs: 2.2 10*3/uL (ref 1.7–7.7)
Neutrophils Relative %: 58 %
Platelet Count: 319 10*3/uL (ref 150–400)
RBC: 3.89 MIL/uL — ABNORMAL LOW (ref 4.22–5.81)
RDW: 12.9 % (ref 11.5–15.5)
WBC Count: 3.8 10*3/uL — ABNORMAL LOW (ref 4.0–10.5)
nRBC: 0 % (ref 0.0–0.2)

## 2022-04-08 NOTE — Telephone Encounter (Signed)
-----   Message from Owens Shark, NP sent at 04/08/2022  2:00 PM EST ----- Please let him know hemoglobin is better, 11.8.  Follow-up as scheduled.

## 2022-04-08 NOTE — Telephone Encounter (Signed)
Notify the patient that his hemoglobin is better is 11.8. and F/U as scheduled

## 2022-05-06 ENCOUNTER — Inpatient Hospital Stay: Payer: Medicaid Other | Attending: Nurse Practitioner

## 2022-05-06 VITALS — BP 106/72 | HR 92 | Temp 98.0°F | Resp 18

## 2022-05-06 DIAGNOSIS — C20 Malignant neoplasm of rectum: Secondary | ICD-10-CM | POA: Insufficient documentation

## 2022-05-06 DIAGNOSIS — Z452 Encounter for adjustment and management of vascular access device: Secondary | ICD-10-CM | POA: Insufficient documentation

## 2022-05-06 DIAGNOSIS — C787 Secondary malignant neoplasm of liver and intrahepatic bile duct: Secondary | ICD-10-CM | POA: Diagnosis not present

## 2022-05-06 DIAGNOSIS — Z95828 Presence of other vascular implants and grafts: Secondary | ICD-10-CM

## 2022-05-06 MED ORDER — SODIUM CHLORIDE 0.9% FLUSH
10.0000 mL | Freq: Once | INTRAVENOUS | Status: AC
  Administered 2022-05-06: 10 mL via INTRAVENOUS

## 2022-05-06 MED ORDER — HEPARIN SOD (PORK) LOCK FLUSH 100 UNIT/ML IV SOLN
500.0000 [IU] | Freq: Once | INTRAVENOUS | Status: AC
  Administered 2022-05-06: 500 [IU] via INTRAVENOUS

## 2022-05-06 NOTE — Patient Instructions (Signed)

## 2022-05-08 ENCOUNTER — Emergency Department (HOSPITAL_COMMUNITY)
Admission: EM | Admit: 2022-05-08 | Discharge: 2022-05-09 | Disposition: A | Discharge status: Home / Self Care | Payer: Medicaid Other | Attending: Emergency Medicine | Admitting: Emergency Medicine

## 2022-05-08 DIAGNOSIS — R202 Paresthesia of skin: Secondary | ICD-10-CM | POA: Diagnosis not present

## 2022-05-08 DIAGNOSIS — Z85048 Personal history of other malignant neoplasm of rectum, rectosigmoid junction, and anus: Secondary | ICD-10-CM | POA: Insufficient documentation

## 2022-05-08 NOTE — ED Triage Notes (Signed)
Bilateral hand numbness since lastnight after injecting meth, has a significant Hx of anxiety. No neurological symptoms other than the tingling/numbness in both hands.   Medic vitals  122/90 84hr 100% RA 20 RR

## 2022-05-09 ENCOUNTER — Encounter (HOSPITAL_COMMUNITY): Payer: Self-pay

## 2022-05-09 LAB — CBC
HCT: 38.3 % — ABNORMAL LOW (ref 39.0–52.0)
Hemoglobin: 12.5 g/dL — ABNORMAL LOW (ref 13.0–17.0)
MCH: 30.1 pg (ref 26.0–34.0)
MCHC: 32.6 g/dL (ref 30.0–36.0)
MCV: 92.3 fL (ref 80.0–100.0)
Platelets: 219 10*3/uL (ref 150–400)
RBC: 4.15 MIL/uL — ABNORMAL LOW (ref 4.22–5.81)
RDW: 13.5 % (ref 11.5–15.5)
WBC: 5.6 10*3/uL (ref 4.0–10.5)
nRBC: 0 % (ref 0.0–0.2)

## 2022-05-09 LAB — BASIC METABOLIC PANEL
Anion gap: 12 (ref 5–15)
BUN: 11 mg/dL (ref 6–20)
CO2: 21 mmol/L — ABNORMAL LOW (ref 22–32)
Calcium: 9.3 mg/dL (ref 8.9–10.3)
Chloride: 102 mmol/L (ref 98–111)
Creatinine, Ser: 1 mg/dL (ref 0.61–1.24)
GFR, Estimated: >60 mL/min (ref >60)
Glucose, Bld: 100 mg/dL — ABNORMAL HIGH (ref 70–99)
Potassium: 3.5 mmol/L (ref 3.5–5.1)
Sodium: 135 mmol/L (ref 135–145)

## 2022-05-09 NOTE — ED Notes (Signed)
Pt states his hands feel like they are burning and his skin on his hands is "lifting". This RN did not notice any peeling of skin on hands.

## 2022-05-09 NOTE — ED Provider Notes (Signed)
[facility] Note   CSN: [id number] Arrival date & time: 05/08/22  2355     History  Chief Complaint  Patient presents with   Numbness    Jeremiah Allen is a 37 y.o. male.  37 y/o male with hx of metastatic rectal cancer s/p LAR with DLI and hepatectomy, GERD, and bipolar disorder presents to the ED for the evaluation of paresthesias.  He reports developing paresthesias in his bilateral hands tonight around 2100.  Describes his paresthesias as a "tingling" sensation in his hands and fingers.  States that his paresthesias have now transitioned to a burning type pain.  Feels as though he is unable to move his fingers or close his hands to make a fist due to worsening burning sensation/pain.  He has not attempted any interventions prior to arrival for symptom control.  Denies trauma, fevers, new or worsening neck or back pain, bowel or bladder incontinence, extremity weakness, lower extremity numbness/paresthesias. Patient endorses use of IV Methamphetamines at 2000 Thursday evening; injected in 5 different locations of b/l AC fossa. He is not presently undergoing any chemotherapy infusions.  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  Not presently on any other immunomodulators.        Home Medications Prior to Admission medications   Medication Sig Start Date End Date Taking? Authorizing Provider  acetaminophen (TYLENOL) 500 MG tablet Take 500-1,000 mg by mouth every 6 (six) hours as needed for mild pain or moderate pain.    [provider]  albuterol (VENTOLIN HFA) 108 (90 Base) MCG/ACT inhaler Inhale 1-2 puffs into the lungs every 6 (six) hours as needed for wheezing or shortness of breath.    [provider]  famotidine (PEPCID) 20 MG tablet Take 20 mg by mouth 2 (two) times daily as needed for heartburn or indigestion.    [provider]   hydrOXYzine (ATARAX) 25 MG tablet Take 25 mg by mouth 3 (three) times daily.    [provider]  methocarbamol (ROBAXIN) 500 MG tablet Take 1 tablet (500 mg total) by mouth 3 (three) times daily. 04/06/22     oxyCODONE (OXY IR/ROXICODONE) 5 MG immediate release tablet Take 1 tablet (5 mg total) by mouth every 6 (six) hours as needed for Pain 03/24/22     promethazine (PHENERGAN) 12.5 MG tablet Take 1 tablet (12.5 mg total) by mouth every 8 (eight) hours as needed for nausea or vomiting. 03/25/22   Rana Snarehomas, Lisa K, NP      Allergies    Crab extract and Dilaudid [hydromorphone]    Review of Systems   Review of Systems Ten systems reviewed and are negative for acute change, except as noted in the HPI.    Physical Exam Updated Vital Signs BP 109/77   Pulse 83   Temp 98.2 F (36.8 C)   Resp 16   SpO2 95%   Physical Exam Vitals and nursing note reviewed.  Constitutional:      General: He is not in acute distress.    Appearance: He is well-developed. He is not diaphoretic.     Comments: Slightly anxious appearing  HENT:     Head: Normocephalic and atraumatic.  Eyes:     General: No scleral icterus.    Conjunctiva/sclera: Conjunctivae normal.  Cardiovascular:     Rate and Rhythm: Normal rate and regular rhythm.     Pulses: Normal pulses.  Comments: No murmur noted.  Capillary refill brisk in all digits of bilateral hands. Pulmonary:     Effort: Pulmonary effort is normal. No respiratory distress.  Musculoskeletal:        General: Normal range of motion.     Cervical back: Normal range of motion.     Comments: Very faint mottling to palmar aspect of bilateral hands.  Extremities remain warm, well-perfused.  Skin:    General: Skin is warm and dry.     Coloration: Skin is not pale.     Findings: No erythema or rash.     Comments: No janeway lesions or osler nodes  Neurological:     Mental Status: He is alert and oriented to person, place, and time.     Coordination:  Coordination normal.     Comments: Grip strength intact and equal bilaterally.  Normal abduction and adduction of all digits against resistance.  Finger to thumb opposition intact bilaterally.  Psychiatric:        Behavior: Behavior normal.     ED Results / Procedures / Treatments   Labs (all labs ordered are listed, but only abnormal results are displayed) Labs Reviewed  CBC - Abnormal; Notable for the following components:      Result Value   RBC 4.15 (*)    Hemoglobin 12.5 (*)    HCT 38.3 (*)    All other components within normal limits  BASIC METABOLIC PANEL - Abnormal; Notable for the following components:   CO2 21 (*)    Glucose, Bld 100 (*)    All other components within normal limits    EKG None  Radiology No results found.  Procedures Procedures    Medications Ordered in ED Medications - No data to display  ED Course/ Medical Decision Making/ A&P                             Medical Decision Making Amount and/or Complexity of Data Reviewed Labs: ordered.   This patient presents to the ED for concern of paresthesias, this involves an extensive number of treatment options, and is a complaint that carries with it a high risk of complications and morbidity.  The differential diagnosis includes anxiety vs electrolyte derangement vs CNS process vs sepsis   Co morbidities that complicate the patient evaluation  Metastatic rectal cancer IVDU   Additional history obtained:  External records from outside source obtained and reviewed including operative note from ileostomy reversal   Lab Tests:  I Ordered, and personally interpreted labs.  The pertinent results include:  CBC and BMP at baseline   Cardiac Monitoring:  The patient was maintained on a cardiac monitor.  I personally viewed and interpreted the cardiac monitored which showed an underlying rhythm of: NSR   Medicines ordered and prescription drug management:  I have reviewed the patients  home medicines and have made adjustments as needed   Test Considered:  Folate, B12 - however, these are non-emergent tests which can be completed as an outpatient   Problem List / ED Course:  Patient presenting for paresthesias described as a tingling which became more of a burning pain like sensation in his bilateral fingers and hands.  Patient neurovascularly intact on exam.  His extremities are warm, well-perfused.  Capillary refill brisk in all digits bilaterally. Patient concerned about recent IV drug use.  He has no clinical exam findings to suggest cellulitis or abscess in either extremity.  Have  also considered more central cause of symptoms; however, given nonfocal neurologic examination without fever, new/worsening neck or back pain this is felt to be unlikely. No present concern for bacteriemia. Does not meet criteria for SIRS/sepsis. Considered folate, B12 deficiency - can be explored further as an outpatient. Patient does have a hx of anxiety which may be contributing to today's complaint.   Reevaluation:  After the interventions noted above, I reevaluated the patient and found that they have :stayed the same   Social Determinants of Health:  IVDU   Dispostion:  After consideration of the diagnostic results and the patients response to treatment, I feel that the patent would benefit from outpatient PCP follow up. Return precautions discussed and provided. Patient discharged in stable condition with no unaddressed concerns.         Final Clinical Impression(s) / ED Diagnoses Final diagnoses:  Paresthesia of both hands    Rx / DC Orders ED Discharge Orders     None         Antony Madura, PA-C 05/09/22 0432    Tilden Fossa, MD 05/09/22 [phone]

## 2022-05-09 NOTE — Discharge Instructions (Addendum)
Your workup in the ED was reassuring.  We recommend ongoing evaluation by your primary care doctor if symptoms persist.  You may return for new or concerning symptoms.  Discontinue use of IV drugs.

## 2022-06-16 ENCOUNTER — Inpatient Hospital Stay: Payer: Medicaid Other | Attending: Nurse Practitioner | Admitting: Licensed Clinical Social Worker

## 2022-06-16 ENCOUNTER — Other Ambulatory Visit: Payer: Self-pay | Admitting: *Deleted

## 2022-06-16 DIAGNOSIS — C787 Secondary malignant neoplasm of liver and intrahepatic bile duct: Secondary | ICD-10-CM

## 2022-06-16 DIAGNOSIS — R194 Change in bowel habit: Secondary | ICD-10-CM | POA: Insufficient documentation

## 2022-06-16 DIAGNOSIS — C20 Malignant neoplasm of rectum: Secondary | ICD-10-CM | POA: Insufficient documentation

## 2022-06-16 NOTE — Progress Notes (Signed)
[facility] CSW Progress Note  Clinical Child psychotherapist contacted patient by phone per Nurse Navigator referral to assess needs.  Patient stated he is adjusting to "being normal".  Discussed his previous treatments and surgeries.  Explored possible counseling to address his anxiety.  Provided contact information for therapists.  No other needs expressed.    Dorothey Baseman, LCSW Clinical Social Worker [facility]    Patient is participating in a Managed Medicaid Plan:  Yes

## 2022-06-23 ENCOUNTER — Encounter (HOSPITAL_BASED_OUTPATIENT_CLINIC_OR_DEPARTMENT_OTHER): Payer: Self-pay

## 2022-06-23 ENCOUNTER — Inpatient Hospital Stay: Payer: Medicaid Other

## 2022-06-23 ENCOUNTER — Ambulatory Visit (HOSPITAL_BASED_OUTPATIENT_CLINIC_OR_DEPARTMENT_OTHER)
Admission: RE | Admit: 2022-06-23 | Discharge: 2022-06-23 | Disposition: A | Discharge status: Home / Self Care | Payer: Medicaid Other | Source: Ambulatory Visit | Attending: Nurse Practitioner | Admitting: Nurse Practitioner

## 2022-06-23 DIAGNOSIS — C785 Secondary malignant neoplasm of large intestine and rectum: Secondary | ICD-10-CM | POA: Diagnosis not present

## 2022-06-23 DIAGNOSIS — C787 Secondary malignant neoplasm of liver and intrahepatic bile duct: Secondary | ICD-10-CM | POA: Diagnosis not present

## 2022-06-23 DIAGNOSIS — K7689 Other specified diseases of liver: Secondary | ICD-10-CM | POA: Diagnosis not present

## 2022-06-23 DIAGNOSIS — C20 Malignant neoplasm of rectum: Secondary | ICD-10-CM

## 2022-06-23 DIAGNOSIS — R194 Change in bowel habit: Secondary | ICD-10-CM | POA: Diagnosis not present

## 2022-06-23 DIAGNOSIS — C801 Malignant (primary) neoplasm, unspecified: Secondary | ICD-10-CM | POA: Diagnosis not present

## 2022-06-23 LAB — CBC WITH DIFFERENTIAL (CANCER CENTER ONLY)
Abs Immature Granulocytes: 0 10*3/uL (ref 0.00–0.07)
Basophils Absolute: 0 10*3/uL (ref 0.0–0.1)
Basophils Relative: 1 %
Eosinophils Absolute: 0.1 10*3/uL (ref 0.0–0.5)
Eosinophils Relative: 3 %
HCT: 38.2 % — ABNORMAL LOW (ref 39.0–52.0)
Hemoglobin: 12.7 g/dL — ABNORMAL LOW (ref 13.0–17.0)
Immature Granulocytes: 0 %
Lymphocytes Relative: 26 %
Lymphs Abs: 1 10*3/uL (ref 0.7–4.0)
MCH: 30.2 pg (ref 26.0–34.0)
MCHC: 33.2 g/dL (ref 30.0–36.0)
MCV: 90.7 fL (ref 80.0–100.0)
Monocytes Absolute: 0.3 10*3/uL (ref 0.1–1.0)
Monocytes Relative: 9 %
Neutro Abs: 2.2 10*3/uL (ref 1.7–7.7)
Neutrophils Relative %: 61 %
Platelet Count: 173 10*3/uL (ref 150–400)
RBC: 4.21 MIL/uL — ABNORMAL LOW (ref 4.22–5.81)
RDW: 13.3 % (ref 11.5–15.5)
WBC Count: 3.6 10*3/uL — ABNORMAL LOW (ref 4.0–10.5)
nRBC: 0 % (ref 0.0–0.2)

## 2022-06-23 LAB — CMP (CANCER CENTER ONLY)
ALT: 6 U/L (ref 0–44)
AST: 13 U/L — ABNORMAL LOW (ref 15–41)
Albumin: 4.4 g/dL (ref 3.5–5.0)
Alkaline Phosphatase: 59 U/L (ref 38–126)
Anion gap: 10 (ref 5–15)
BUN: 10 mg/dL (ref 6–20)
CO2: 25 mmol/L (ref 22–32)
Calcium: 9.1 mg/dL (ref 8.9–10.3)
Chloride: 103 mmol/L (ref 98–111)
Creatinine: 0.95 mg/dL (ref 0.61–1.24)
GFR, Estimated: >60 mL/min (ref >60)
Glucose, Bld: 113 mg/dL — ABNORMAL HIGH (ref 70–99)
Potassium: 3.5 mmol/L (ref 3.5–5.1)
Sodium: 138 mmol/L (ref 135–145)
Total Bilirubin: 0.5 mg/dL (ref 0.3–1.2)
Total Protein: 6.8 g/dL (ref 6.5–8.1)

## 2022-06-23 LAB — POCT I-STAT CREATININE: Creatinine, Ser: 1.1 mg/dL (ref 0.61–1.24)

## 2022-06-23 LAB — CEA (ACCESS): CEA (CHCC): 2.51 ng/mL (ref 0.00–5.00)

## 2022-06-23 MED ORDER — IOHEXOL 300 MG/ML  SOLN
100.0000 mL | Freq: Once | INTRAMUSCULAR | Status: AC | PRN
  Administered 2022-06-23: 85 mL via INTRAVENOUS

## 2022-06-23 NOTE — Progress Notes (Signed)
Patient seen for [facility] w/Lab draw after his CT Scan appointment today. He complained of pain that radiates from his colon to his anus/rectum where he had his surgery on 01/15/22, ileostomy resection was on 03/18/22. He reported that pain is aggravated with bowel movement and continues for an hour or more after, making it difficult to sit or ride in a car especially with speed bumps. He described the pain as intense itching with burning. He stated that Witch-hazel cleansing wipes helps a bit to relieve the pain. He reported not taking any medication for pain because he has none. Pain is a 5/10 during this visit. Per patient "it feels like my colon is swelling'. Misty Stanley, NP was informed and she inquired how long patient has had this and if his surgeon has been notified. Patient shared that it "gradually built since surgery. Super intense since the last 2 weeks. Has not spoken with surgeon about it." Misty Stanley, NP then advised that patient call surgeon about this. Patient was informed and he was in agreement. No further concerns expressed at this time.

## 2022-06-23 NOTE — Patient Instructions (Signed)

## 2022-06-24 ENCOUNTER — Other Ambulatory Visit (HOSPITAL_BASED_OUTPATIENT_CLINIC_OR_DEPARTMENT_OTHER): Payer: Self-pay

## 2022-06-24 MED ORDER — HYDROCORTISONE (PERIANAL) 2.5 % EX CREA
TOPICAL_CREAM | CUTANEOUS | 1 refills | Status: DC
  Filled 2022-06-24: qty 30, 10d supply, fill #0

## 2022-06-26 ENCOUNTER — Inpatient Hospital Stay (HOSPITAL_BASED_OUTPATIENT_CLINIC_OR_DEPARTMENT_OTHER): Payer: Medicaid Other | Admitting: Oncology

## 2022-06-26 ENCOUNTER — Telehealth: Payer: Self-pay | Admitting: Oncology

## 2022-06-26 VITALS — BP 122/77 | HR 67 | Temp 98.2°F | Resp 18 | Ht 69.0 in | Wt 161.5 lb

## 2022-06-26 DIAGNOSIS — C20 Malignant neoplasm of rectum: Secondary | ICD-10-CM | POA: Diagnosis not present

## 2022-06-26 DIAGNOSIS — C787 Secondary malignant neoplasm of liver and intrahepatic bile duct: Secondary | ICD-10-CM | POA: Diagnosis not present

## 2022-06-26 NOTE — Progress Notes (Signed)
[facility] OFFICE PROGRESS NOTE   Diagnosis: Rectal cancer  INTERVAL HISTORY:   Jeremiah Allen returns as scheduled.  He generally feels well.  He has irregular bowel habits.  He reports rectal urgency after eating.  He has multiple bowel movements each time he eats.  He has difficulty passing gas.  Objective:  Vital signs in last 24 hours:  Blood pressure 122/77, pulse 67, temperature 98.2 F (36.8 C), temperature source Oral, resp. rate 18, height 5\' 9"  (1.753 m), weight 161 lb 8 oz (73.3 kg), SpO2 98 %.    Lymphatics: No cervical, supraclavicular, axillary, or inguinal nodes Resp: Clear bilaterally Cardio: Regular rate and rhythm GI: No hepatosplenomegaly, nontender, no mass Vascular: No leg edema   Portacath/PICC-without erythema  Lab Results:  Lab Results  Component Value Date   WBC 3.6 (L) 06/23/2022   HGB 12.7 (L) 06/23/2022   HCT 38.2 (L) 06/23/2022   MCV 90.7 06/23/2022   PLT 173 06/23/2022   NEUTROABS 2.2 06/23/2022    CMP  Lab Results  Component Value Date   NA 138 06/23/2022   K 3.5 06/23/2022   CL 103 06/23/2022   CO2 25 06/23/2022   GLUCOSE 113 (H) 06/23/2022   BUN 10 06/23/2022   CREATININE 0.95 06/23/2022   CALCIUM 9.1 06/23/2022   PROT 6.8 06/23/2022   ALBUMIN 4.4 06/23/2022   AST 13 (L) 06/23/2022   ALT 6 06/23/2022   ALKPHOS 59 06/23/2022   BILITOT 0.5 06/23/2022   GFRNONAA >60 06/23/2022    Lab Results  Component Value Date   CEA1 14.5 (H) 04/17/2021   CEA 2.51 06/23/2022      Imaging:  CT CHEST ABDOMEN PELVIS W CONTRAST  Result Date: 06/24/2022 CLINICAL DATA:  Metastatic rectal cancer restaging * Tracking Code: BO * EXAM: CT CHEST, ABDOMEN, AND PELVIS WITH CONTRAST TECHNIQUE: Multidetector CT imaging of the chest, abdomen and pelvis was performed following the standard protocol during bolus administration of intravenous contrast. RADIATION DOSE REDUCTION: This exam was performed according to the departmental  dose-optimization program which includes automated exposure control, adjustment of the mA and/or kV according to patient size and/or use of iterative reconstruction technique. CONTRAST:  85mL OMNIPAQUE IOHEXOL 300 MG/ML SOLN additional oral enteric contrast COMPARISON:  CT abdomen pelvis, 03/07/2022 CT chest abdomen pelvis, 01/24/2022 FINDINGS: CT CHEST FINDINGS Cardiovascular: Right chest port catheter. Normal heart size. No pericardial effusion. Mediastinum/Nodes: No enlarged mediastinal, hilar, or axillary lymph nodes. Thymic remnant anterior mediastinum. Thyroid gland, trachea, and esophagus demonstrate no significant findings. Lungs/Pleura: Lungs are clear. No pleural effusion or pneumothorax. Musculoskeletal: No chest wall abnormality. No acute osseous findings. CT ABDOMEN PELVIS FINDINGS Hepatobiliary: Unchanged treated/resected liver lesions in anterior hepatic segment VIII (series 6, image 48) posterior hepatic segment VII (series 6, image 50), and peripheral hepatic segment VI (series 6, image 65). No new liver lesions. No gallstones, gallbladder wall thickening, or biliary dilatation. Pancreas: Unremarkable. No pancreatic ductal dilatation or surrounding inflammatory changes. Spleen: Normal in size without significant abnormality. Adrenals/Urinary Tract: Adrenal glands are unremarkable. Kidneys are normal, without renal calculi, solid lesion, or hydronephrosis. Unchanged mild diffuse bladder wall thickening (series 4, image 63). Stomach/Bowel: Stomach is within normal limits. Appendix appears normal. Interval diverting ileostomy reversal with rectal resection and reanastomosis (series 6, image 101, 115). No evidence of bowel wall thickening, distention, or inflammatory changes. Vascular/Lymphatic: No significant vascular findings are present. No enlarged abdominal or pelvic lymph nodes. Reproductive: No mass or other abnormality. Other: No abdominal wall  hernia. Prior right lower quadrant ileostomy site  (series 6, image 80). Unchanged small volume, loculated appearing fluid in the low pelvis (series 6, image 100). Musculoskeletal: No acute osseous findings. IMPRESSION: 1. Unchanged treated/resected liver lesions. No new liver lesions. 2. No evidence of new metastatic disease in the chest, abdomen, or pelvis. 3. Interval diverting ileostomy reversal with rectal resection and reanastomosis. 4. Unchanged small volume, loculated appearing fluid in the low pelvis. 5. Unchanged mild diffuse bladder wall thickening, possibly related to pelvic radiation therapy. Correlate with urinalysis. Electronically Signed   By: Jearld Lesch M.D.   On: 06/24/2022 17:45    Medications: I have reviewed the patient's current medications.   Assessment/Plan:  Rectal cancer-clinical stage IV(T4aN2, M1) Sigmoidoscopy 04/16/2021-mass at 4 cm from the anal verge, invasive moderately differentiated adenocarcinoma with lymphovascular invasion OmniSeq-MSS, tumor mutation burden 3.9, PD-L1 less than 1%, K-ras G12 D CT angio abdomen/pelvis-bulky irregular rectal wall thickening with perirectal lymphadenopathy and 2 indeterminate liver lesions MRI liver 04/17/2021-3 discrete right liver metastases MRI pelvis 04/17/2021-T4aN2 rectal tumor-10 cm from the anal verge, involvement of left anterior peritoneal reflection, numerous mesorectal lymph nodes within the pelvis, no evidence of extra mesorectal adenopathy greater than 10 nodes in the mesorectum CT chest 04/18/2021-no evidence of metastatic disease Elevated CEA (14.5 on 04/17/2021) Cycle 1 FOLFOX 04/30/2021 Cycle 2 FOLFOX 05/14/2021 Cycle 3 FOLFOX 05/28/2021, Udenyca Cycle 4 FOLFOX 06/11/2021, prophylactic dexamethasone added for delayed nausea, Udenyca Cycle 5 FOLFOX 06/25/2021, Udenyca Cycle 6 FOLFOX 07/08/2021, Udenyca CTs 07/17/2021-decrease in size of hepatic lesions, decreased rectal wall thickening, decrease size of perirectal lymph nodes, lingular consolidation consistent with  infectious/inflammatory process 08/04/2021-right liver metastectomy procedures, segment 6, 7, 8 lesions removed.  The pathology from the segment 6 and segment 8 lesions revealed no residual carcinoma, the segment 7 lesion revealed pools of mucin and associated calcifications with rare atypical cells-partial to complete response, extensive thermal/mechanical artifact with focal ink on mucin, margin interpreted as negative, though a positive margin could not be excluded Cycle 7 FOLFOX 08/27/2021 Cycle 8 FOLFOX 09/10/2021 Radiation/Xeloda 09/30/2021-11/07/2021 CTs 01/05/2022-multiple areas of hepatic wedge resection, no new or progressive disease, unchanged mid to high rectal thickening Robotic low anterior resection/ileostomy 01/15/2022-no residual carcinoma consistent with complete response, fibrosis consistent with treatment effect, negative margins, 0/15 nodes Loop ileostomy reversal 03/18/2021 CTs 06/23/2022-no evidence of metastatic disease, unchanged treated/resected liver lesions, unchanged small volume loculated fluid in the low pelvis Rectal bleeding and pain secondary to #1 yT0, ypN0 Chronic left sciatica Bipolar disorder Right testicular fullness noted on exam 04/21/2021-epididymal cyst? 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  He would like to keep the Port-A-Cath and flush.  He will will return for a Port-A-Cath flush in 6 weeks and 12 weeks.  We will check the CEA when he is here in 12 weeks.  He will be scheduled for surveillance imaging and an office visit in 6 months.  We will refer him to the pelvic physical therapy clinic.  He is scheduled to see Dr. Maisie Fus in approximately 2 weeks for management of the irregular bowel habits.  Thornton Papas, MD  06/26/2022  12:36 PM

## 2022-06-26 NOTE — Telephone Encounter (Signed)
Spoke with patient confirming upcoming appointment  

## 2022-07-06 DIAGNOSIS — K6289 Other specified diseases of anus and rectum: Secondary | ICD-10-CM | POA: Diagnosis not present

## 2022-07-06 DIAGNOSIS — C20 Malignant neoplasm of rectum: Secondary | ICD-10-CM | POA: Diagnosis not present

## 2022-07-06 DIAGNOSIS — C787 Secondary malignant neoplasm of liver and intrahepatic bile duct: Secondary | ICD-10-CM | POA: Diagnosis not present

## 2022-07-15 ENCOUNTER — Ambulatory Visit: Payer: Medicaid Other

## 2022-08-05 ENCOUNTER — Encounter: Payer: Self-pay | Admitting: Oncology

## 2022-08-07 ENCOUNTER — Telehealth: Payer: Self-pay | Admitting: Oncology

## 2022-08-07 ENCOUNTER — Encounter: Payer: Self-pay | Admitting: *Deleted

## 2022-08-07 ENCOUNTER — Inpatient Hospital Stay: Payer: Medicaid Other | Attending: Nurse Practitioner

## 2022-08-07 DIAGNOSIS — C20 Malignant neoplasm of rectum: Secondary | ICD-10-CM | POA: Insufficient documentation

## 2022-08-07 DIAGNOSIS — Z452 Encounter for adjustment and management of vascular access device: Secondary | ICD-10-CM | POA: Insufficient documentation

## 2022-08-07 DIAGNOSIS — C787 Secondary malignant neoplasm of liver and intrahepatic bile duct: Secondary | ICD-10-CM | POA: Insufficient documentation

## 2022-08-07 NOTE — Telephone Encounter (Signed)
Scheduled appointment per scheduling message. Left voicemail.  

## 2022-08-07 NOTE — Progress Notes (Signed)
"  No show for port flush today. Scheduling message sent to reschedule.

## 2022-08-13 ENCOUNTER — Inpatient Hospital Stay: Payer: Medicaid Other

## 2022-08-17 ENCOUNTER — Inpatient Hospital Stay: Payer: Medicaid Other

## 2022-08-17 DIAGNOSIS — C787 Secondary malignant neoplasm of liver and intrahepatic bile duct: Secondary | ICD-10-CM | POA: Diagnosis not present

## 2022-08-17 DIAGNOSIS — C20 Malignant neoplasm of rectum: Secondary | ICD-10-CM | POA: Diagnosis not present

## 2022-08-17 DIAGNOSIS — Z452 Encounter for adjustment and management of vascular access device: Secondary | ICD-10-CM | POA: Diagnosis not present

## 2022-08-17 DIAGNOSIS — Z95828 Presence of other vascular implants and grafts: Secondary | ICD-10-CM

## 2022-08-17 MED ORDER — HEPARIN SOD (PORK) LOCK FLUSH 100 UNIT/ML IV SOLN
500.0000 [IU] | Freq: Once | INTRAVENOUS | Status: AC
  Administered 2022-08-17: 500 [IU] via INTRAVENOUS

## 2022-08-17 MED ORDER — SODIUM CHLORIDE 0.9% FLUSH
10.0000 mL | INTRAVENOUS | Status: DC | PRN
  Administered 2022-08-17: 10 mL via INTRAVENOUS

## 2022-08-17 NOTE — Patient Instructions (Signed)

## 2022-09-18 ENCOUNTER — Inpatient Hospital Stay: Payer: Medicaid Other

## 2022-09-18 ENCOUNTER — Inpatient Hospital Stay: Payer: Medicaid Other | Attending: Nurse Practitioner

## 2022-09-18 DIAGNOSIS — Z0000 Encounter for general adult medical examination without abnormal findings: Secondary | ICD-10-CM | POA: Diagnosis not present

## 2022-09-18 DIAGNOSIS — C787 Secondary malignant neoplasm of liver and intrahepatic bile duct: Secondary | ICD-10-CM | POA: Insufficient documentation

## 2022-09-18 DIAGNOSIS — Z6821 Body mass index (BMI) 21.0-21.9, adult: Secondary | ICD-10-CM | POA: Diagnosis not present

## 2022-09-18 DIAGNOSIS — C20 Malignant neoplasm of rectum: Secondary | ICD-10-CM | POA: Insufficient documentation

## 2022-09-21 ENCOUNTER — Inpatient Hospital Stay: Payer: Medicaid Other

## 2022-09-24 ENCOUNTER — Inpatient Hospital Stay: Payer: Medicaid Other

## 2022-09-24 VITALS — BP 108/71 | HR 59 | Resp 18

## 2022-09-24 DIAGNOSIS — Z95828 Presence of other vascular implants and grafts: Secondary | ICD-10-CM

## 2022-09-24 DIAGNOSIS — C20 Malignant neoplasm of rectum: Secondary | ICD-10-CM | POA: Diagnosis not present

## 2022-09-24 DIAGNOSIS — C787 Secondary malignant neoplasm of liver and intrahepatic bile duct: Secondary | ICD-10-CM | POA: Diagnosis not present

## 2022-09-24 LAB — CEA (ACCESS): CEA (CHCC): 2.38 ng/mL (ref 0.00–5.00)

## 2022-09-24 MED ORDER — SODIUM CHLORIDE 0.9% FLUSH
10.0000 mL | Freq: Once | INTRAVENOUS | Status: AC
  Administered 2022-09-24: 10 mL via INTRAVENOUS

## 2022-09-24 MED ORDER — HEPARIN SOD (PORK) LOCK FLUSH 100 UNIT/ML IV SOLN
500.0000 [IU] | Freq: Once | INTRAVENOUS | Status: AC
  Administered 2022-09-24: 500 [IU] via INTRAVENOUS

## 2022-09-24 NOTE — Patient Instructions (Signed)

## 2022-10-29 ENCOUNTER — Encounter (HOSPITAL_COMMUNITY): Payer: Self-pay | Admitting: Licensed Clinical Social Worker

## 2022-10-29 ENCOUNTER — Ambulatory Visit (HOSPITAL_COMMUNITY): Payer: Medicaid Other | Admitting: Licensed Clinical Social Worker

## 2022-10-29 DIAGNOSIS — F1210 Cannabis abuse, uncomplicated: Secondary | ICD-10-CM | POA: Insufficient documentation

## 2022-10-29 DIAGNOSIS — F4325 Adjustment disorder with mixed disturbance of emotions and conduct: Secondary | ICD-10-CM | POA: Insufficient documentation
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
## 2022-11-16 NOTE — Telephone Encounter (Signed)
error 

## 2022-11-19 ENCOUNTER — Inpatient Hospital Stay: Payer: Medicaid Other | Attending: Nurse Practitioner

## 2022-11-19 DIAGNOSIS — Z452 Encounter for adjustment and management of vascular access device: Secondary | ICD-10-CM | POA: Insufficient documentation

## 2022-11-19 DIAGNOSIS — C20 Malignant neoplasm of rectum: Secondary | ICD-10-CM | POA: Insufficient documentation

## 2022-11-19 DIAGNOSIS — C787 Secondary malignant neoplasm of liver and intrahepatic bile duct: Secondary | ICD-10-CM | POA: Insufficient documentation

## 2022-11-20 ENCOUNTER — Inpatient Hospital Stay: Payer: Medicaid Other

## 2022-11-20 VITALS — BP 123/78 | HR 67 | Temp 98.1°F | Resp 18

## 2022-11-20 DIAGNOSIS — Z452 Encounter for adjustment and management of vascular access device: Secondary | ICD-10-CM | POA: Diagnosis present

## 2022-11-20 DIAGNOSIS — C20 Malignant neoplasm of rectum: Secondary | ICD-10-CM | POA: Diagnosis present

## 2022-11-20 DIAGNOSIS — Z95828 Presence of other vascular implants and grafts: Secondary | ICD-10-CM

## 2022-11-20 DIAGNOSIS — C787 Secondary malignant neoplasm of liver and intrahepatic bile duct: Secondary | ICD-10-CM | POA: Diagnosis present

## 2022-11-20 MED ORDER — HEPARIN SOD (PORK) LOCK FLUSH 100 UNIT/ML IV SOLN
500.0000 [IU] | Freq: Once | INTRAVENOUS | Status: AC
  Administered 2022-11-20: 500 [IU] via INTRAVENOUS

## 2022-11-20 MED ORDER — SODIUM CHLORIDE 0.9% FLUSH
10.0000 mL | Freq: Once | INTRAVENOUS | Status: AC
  Administered 2022-11-20: 10 mL via INTRAVENOUS

## 2022-11-20 NOTE — Patient Instructions (Signed)

## 2022-12-03 ENCOUNTER — Ambulatory Visit (HOSPITAL_COMMUNITY): Payer: Medicaid Other | Admitting: Licensed Clinical Social Worker

## 2022-12-03 DIAGNOSIS — F4325 Adjustment disorder with mixed disturbance of emotions and conduct: Secondary | ICD-10-CM | POA: Diagnosis not present
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
## 2022-12-10 ENCOUNTER — Ambulatory Visit (HOSPITAL_COMMUNITY): Payer: Medicaid Other | Admitting: Student

## 2022-12-21 ENCOUNTER — Ambulatory Visit (INDEPENDENT_AMBULATORY_CARE_PROVIDER_SITE_OTHER): Payer: Medicaid Other | Admitting: Licensed Clinical Social Worker

## 2022-12-21 DIAGNOSIS — F4325 Adjustment disorder with mixed disturbance of emotions and conduct: Secondary | ICD-10-CM | POA: Diagnosis not present

## 2022-12-21 DIAGNOSIS — F1210 Cannabis abuse, uncomplicated: Secondary | ICD-10-CM

## 2022-12-21 NOTE — Progress Notes (Signed)
THERAPIST PROGRESS NOTE  Virtual Visit via Video Note  I connected with Brunetta Jeans Gurry on 12/21/22 at  2:00 PM EST by a video enabled telemedicine application and verified that I am speaking with the correct person using two identifiers.  Location: Patient: [facility]  Provider: Providers Home    I discussed the limitations of evaluation and management by telemedicine and the availability of in person appointments. The patient expressed understanding and agreed to proceed.   I discussed the assessment and treatment plan with the patient. The patient was provided an opportunity to ask questions and all were answered. The patient agreed with the plan and demonstrated an understanding of the instructions.   The patient was advised to call back or seek an in-person evaluation if the symptoms worsen or if the condition fails to improve as anticipated.  I provided 50 minutes of non-face-to-face time during this encounter.   [name], LCSW   Participation Level: Active  Behavioral Response: CasualAlertAnxious and Depressed  Type of Therapy: Individual Therapy  Treatment Goals addressed:  Active     Anxiety     LTG: Javel will score less than 5 on the Generalized Anxiety Disorder 7 Scale (GAD-7)      Start:  10/29/22    Expected End:  04/02/23         STG: [name] will participate in at least 80% of scheduled individual psychotherapy sessions  (Progressing)     Start:  10/29/22    Expected End:  04/02/23         STG: [name] will complete at least 80% of assigned homework  (Progressing)     Start:  10/29/22    Expected End:  04/02/23         STG: [name] will practice problem solving skills 3 times per week for the next 4 weeks.  (Progressing)     Start:  10/29/22    Expected End:  04/02/23         identify 3 triggers for anxiety  (Progressing)     Start:  10/29/22    Expected End:  04/02/23         Discuss risks and benefits of medication  treatment options for this problem and prescribe as indicated     Start:  10/29/22         Encourage Randal to take psychotropic medication(s) as prescribed     Start:  10/29/22         Review results of GAD-7 with Birch to track progress     Start:  10/29/22         Work with [name] to track symptoms, triggers, and/or skill use through a mood chart, diary card, or journal     Start:  10/29/22         Perform psychoeducation regarding anxiety disorders     Start:  10/29/22         Provide [name] with educational information and reading material on anxiety, its causes, and symptoms.      Start:  10/29/22         Work with patient individually to identify the major components of a recent episode of anxiety: physical symptoms, major thoughts and images, and major behaviors they experienced     Start:  10/29/22         Work with [name] to identify 3 personal goals for managing their anxiety to work on during current treatment.      Start:  10/29/22  Work with [name] to identify a minimum of 3 consequences of avoidance.      Start:  10/29/22         Work with [name] to identify a minimum of 3 alternative coping behaviors to avoidance.      Start:  10/29/22         Create a weekly activity schedule     Start:  10/29/22         Perform motivational interviewing regarding engagement and attendance with therapy     Start:  10/29/22         Perform motivational interviewing regarding completion of homework assignments     Start:  10/29/22         Perform motivational interviewing regarding use of tools     Start:  10/29/22         Perform motivational interviewing regarding physical activity     Start:  10/29/22         Perform motivational interviewing regarding medication adherence     Start:  10/29/22           OP Depression     LTG: Jamarea will score less than 5 on the Patient Health Questionnaire (PHQ-9)      Start:  10/29/22     Expected End:  04/02/23         identify 3 trigger for depression  (Progressing)     Start:  10/29/22    Expected End:  04/02/23         identify 3 coping skills for depression  (Progressing)     Start:  10/29/22    Expected End:  04/02/23         Work with [name] to track symptoms, triggers, and/or skill use through a mood chart, diary card, or journal     Start:  10/29/22         Encourage Bonham to participate in recovery peer support activities weekly      Start:  10/29/22         Therapist will administer the PHQ-9 at weekly intervals for the next 8 weeks     Start:  10/29/22         Provide [name] educational information and reading material on dissociation, its causes, and symptoms     Start:  10/29/22         Work with [name] to identify the major components of a recent episode of depression: physical symptoms, major thoughts and images, and major behaviors they experienced     Start:  10/29/22            ProgressTowards Goals: Progressing  Interventions: CBT, Motivational Interviewing, and Supportive    Suicidal/Homicidal: Nowithout intent/plan  Therapist Response:  Patient was alert and oriented x 5.  He was pleasant, cooperative, maintained good eye contact.  Engaged well in therapy session was dressed casually.  [name] presented today with anxious mood\affect.  Patient reports primary stressors in today's session was communication.  Patient reports that he struggles with communicating with others about his needs and wants.  Patient states that he usually internalizes his emotions and then externalizes them inappropriately when they boil over.  Patient reports example of when the trash is full his significant other will not always take it out and then he has to look at it in the morning.  Patient also reports that she will not fill up to cure and I will be empty in the morning causing him to have  to fill it back up even though he was not the last 1 to use  it.  Terrell reports frustration, tension, and irritability.  Intervention\plan: LCSW provided patient worksheets for grounding techniques and coping skills for anxiety.  LCSW also provided patient worksheets for waking up refreshed.  This included examples of prioritizing good sleep hygiene, not pressing snooze, eating a good breakfast, and providing adequate time in the mornings to wake up.   Plan: Return again in 3 weeks.  Diagnosis: Adjustment disorder with mixed disturbance of emotions and conduct  Cannabis abuse  Collaboration of Care: Other None today   Patient/Guardian was advised Release of Information must be obtained prior to any record release in order to collaborate their care with an outside provider. Patient/Guardian was advised if they have not already done so to contact the registration department to sign all necessary forms in order for [place] to release information regarding their care.   Consent: Patient/Guardian gives verbal consent for treatment and assignment of benefits for services provided during this visit. Patient/Guardian expressed understanding and agreed to proceed.   [name], LCSW 12/21/2022

## 2022-12-24 ENCOUNTER — Inpatient Hospital Stay: Payer: Medicaid Other | Attending: Nurse Practitioner

## 2022-12-24 ENCOUNTER — Inpatient Hospital Stay: Payer: Medicaid Other

## 2022-12-24 ENCOUNTER — Ambulatory Visit (HOSPITAL_BASED_OUTPATIENT_CLINIC_OR_DEPARTMENT_OTHER)
Admission: RE | Admit: 2022-12-24 | Discharge: 2022-12-24 | Disposition: A | Discharge status: Home / Self Care | Payer: Medicaid Other | Source: Ambulatory Visit | Attending: Oncology | Admitting: Oncology

## 2022-12-24 DIAGNOSIS — C20 Malignant neoplasm of rectum: Secondary | ICD-10-CM | POA: Insufficient documentation

## 2022-12-24 DIAGNOSIS — N3289 Other specified disorders of bladder: Secondary | ICD-10-CM | POA: Diagnosis not present

## 2022-12-24 DIAGNOSIS — K7689 Other specified diseases of liver: Secondary | ICD-10-CM | POA: Diagnosis not present

## 2022-12-24 DIAGNOSIS — C787 Secondary malignant neoplasm of liver and intrahepatic bile duct: Secondary | ICD-10-CM | POA: Insufficient documentation

## 2022-12-24 DIAGNOSIS — Z23 Encounter for immunization: Secondary | ICD-10-CM | POA: Insufficient documentation

## 2022-12-24 LAB — CBC WITH DIFFERENTIAL (CANCER CENTER ONLY)
Abs Immature Granulocytes: 0.01 10*3/uL (ref 0.00–0.07)
Basophils Absolute: 0 10*3/uL (ref 0.0–0.1)
Basophils Relative: 1 %
Eosinophils Absolute: 0.1 10*3/uL (ref 0.0–0.5)
Eosinophils Relative: 3 %
HCT: 38.3 % — ABNORMAL LOW (ref 39.0–52.0)
Hemoglobin: 12.8 g/dL — ABNORMAL LOW (ref 13.0–17.0)
Immature Granulocytes: 0 %
Lymphocytes Relative: 41 %
Lymphs Abs: 1.4 10*3/uL (ref 0.7–4.0)
MCH: 30.7 pg (ref 26.0–34.0)
MCHC: 33.4 g/dL (ref 30.0–36.0)
MCV: 91.8 fL (ref 80.0–100.0)
Monocytes Absolute: 0.3 10*3/uL (ref 0.1–1.0)
Monocytes Relative: 10 %
Neutro Abs: 1.5 10*3/uL — ABNORMAL LOW (ref 1.7–7.7)
Neutrophils Relative %: 45 %
Platelet Count: 197 10*3/uL (ref 150–400)
RBC: 4.17 MIL/uL — ABNORMAL LOW (ref 4.22–5.81)
RDW: 12.4 % (ref 11.5–15.5)
WBC Count: 3.4 10*3/uL — ABNORMAL LOW (ref 4.0–10.5)
nRBC: 0 % (ref 0.0–0.2)

## 2022-12-24 LAB — CMP (CANCER CENTER ONLY)
ALT: 7 U/L (ref 0–44)
AST: 14 U/L — ABNORMAL LOW (ref 15–41)
Albumin: 4.7 g/dL (ref 3.5–5.0)
Alkaline Phosphatase: 58 U/L (ref 38–126)
Anion gap: 9 (ref 5–15)
BUN: 9 mg/dL (ref 6–20)
CO2: 27 mmol/L (ref 22–32)
Calcium: 9 mg/dL (ref 8.9–10.3)
Chloride: 100 mmol/L (ref 98–111)
Creatinine: 0.83 mg/dL (ref 0.61–1.24)
GFR, Estimated: >60 mL/min (ref >60)
Glucose, Bld: 97 mg/dL (ref 70–99)
Potassium: 3.7 mmol/L (ref 3.5–5.1)
Sodium: 136 mmol/L (ref 135–145)
Total Bilirubin: 0.5 mg/dL (ref <1.2)
Total Protein: 7.3 g/dL (ref 6.5–8.1)

## 2022-12-24 LAB — CEA (ACCESS): CEA (CHCC): 2.51 ng/mL (ref 0.00–5.00)

## 2022-12-24 MED ORDER — HEPARIN SOD (PORK) LOCK FLUSH 100 UNIT/ML IV SOLN
500.0000 [IU] | Freq: Once | INTRAVENOUS | Status: AC
  Administered 2022-12-24: 500 [IU] via INTRAVENOUS

## 2022-12-24 MED ORDER — IOHEXOL 300 MG/ML  SOLN
100.0000 mL | Freq: Once | INTRAMUSCULAR | Status: AC | PRN
  Administered 2022-12-24: 80 mL via INTRAVENOUS

## 2022-12-29 ENCOUNTER — Inpatient Hospital Stay: Payer: Medicaid Other

## 2022-12-29 ENCOUNTER — Encounter: Payer: Self-pay | Admitting: *Deleted

## 2022-12-29 ENCOUNTER — Inpatient Hospital Stay (HOSPITAL_BASED_OUTPATIENT_CLINIC_OR_DEPARTMENT_OTHER): Payer: Medicaid Other | Admitting: Oncology

## 2022-12-29 VITALS — BP 108/73 | HR 76 | Temp 98.1°F | Resp 18 | Ht 69.0 in | Wt 144.4 lb

## 2022-12-29 DIAGNOSIS — C787 Secondary malignant neoplasm of liver and intrahepatic bile duct: Secondary | ICD-10-CM

## 2022-12-29 DIAGNOSIS — C20 Malignant neoplasm of rectum: Secondary | ICD-10-CM | POA: Diagnosis not present

## 2022-12-29 DIAGNOSIS — Z23 Encounter for immunization: Secondary | ICD-10-CM

## 2022-12-29 MED ORDER — INFLUENZA VIRUS VACC SPLIT PF (FLUZONE) 0.5 ML IM SUSY
0.5000 mL | PREFILLED_SYRINGE | Freq: Once | INTRAMUSCULAR | Status: AC
  Administered 2022-12-29: 0.5 mL via INTRAMUSCULAR
  Filled 2022-12-29: qty 0.5

## 2022-12-29 NOTE — Progress Notes (Signed)
[facility] OFFICE PROGRESS NOTE   Diagnosis: Rectal cancer  INTERVAL HISTORY:   Jeremiah Allen returns as scheduled.  He generally feels well.  He is working part-time.  No neuropathy symptoms.  He has "burning "discomfort in the abdomen after eating certain foods.  He is following a protein only diet.  This helps.  He relates weight loss to the change in his diet.  He has a good appetite.  He has burning at the perineum following bowel movements.  Objective:  Vital signs in last 24 hours:  Blood pressure 108/73, pulse 76, temperature 98.1 F (36.7 C), temperature source Temporal, resp. rate 18, height 5\' 9"  (1.753 m), weight 144 lb 6.4 oz (65.5 kg), SpO2 100%.    Lymphatics: No cervical or supraclavicular nodes.  "Shotty "left axillary node.  No inguinal nodes. Resp: Lungs clear bilaterally Cardio: Regular rate and rhythm GI: No mass, no hepatosplenomegaly, nontender Vascular: No leg edema  Skin: Examination of the anal margin and perineum is unremarkable  Portacath/PICC-without erythema  Lab Results:  Lab Results  Component Value Date   WBC 3.4 (L) 12/24/2022   HGB 12.8 (L) 12/24/2022   HCT 38.3 (L) 12/24/2022   MCV 91.8 12/24/2022   PLT 197 12/24/2022   NEUTROABS 1.5 (L) 12/24/2022    CMP  Lab Results  Component Value Date   NA 136 12/24/2022   K 3.7 12/24/2022   CL 100 12/24/2022   CO2 27 12/24/2022   GLUCOSE 97 12/24/2022   BUN 9 12/24/2022   CREATININE 0.83 12/24/2022   CALCIUM 9.0 12/24/2022   PROT 7.3 12/24/2022   ALBUMIN 4.7 12/24/2022   AST 14 (L) 12/24/2022   ALT 7 12/24/2022   ALKPHOS 58 12/24/2022   BILITOT 0.5 12/24/2022   GFRNONAA >60 12/24/2022    Lab Results  Component Value Date   CEA1 14.5 (H) 04/17/2021   CEA 2.51 12/24/2022      Medications: I have reviewed the patient's current medications.   Assessment/Plan: Rectal cancer-clinical stage IV(T4aN2, M1) Sigmoidoscopy 04/16/2021-mass at 4 cm from the anal verge,  invasive moderately differentiated adenocarcinoma with lymphovascular invasion OmniSeq-MSS, tumor mutation burden 3.9, PD-L1 less than 1%, K-ras G12 D CT angio abdomen/pelvis-bulky irregular rectal wall thickening with perirectal lymphadenopathy and 2 indeterminate liver lesions MRI liver 04/17/2021-3 discrete right liver metastases MRI pelvis 04/17/2021-T4aN2 rectal tumor-10 cm from the anal verge, involvement of left anterior peritoneal reflection, numerous mesorectal lymph nodes within the pelvis, no evidence of extra mesorectal adenopathy greater than 10 nodes in the mesorectum CT chest 04/18/2021-no evidence of metastatic disease Elevated CEA (14.5 on 04/17/2021) Cycle 1 FOLFOX 04/30/2021 Cycle 2 FOLFOX 05/14/2021 Cycle 3 FOLFOX 05/28/2021, Udenyca Cycle 4 FOLFOX 06/11/2021, prophylactic dexamethasone added for delayed nausea, Udenyca Cycle 5 FOLFOX 06/25/2021, Udenyca Cycle 6 FOLFOX 07/08/2021, Udenyca CTs 07/17/2021-decrease in size of hepatic lesions, decreased rectal wall thickening, decrease size of perirectal lymph nodes, lingular consolidation consistent with infectious/inflammatory process 08/04/2021-right liver metastectomy procedures, segment 6, 7, 8 lesions removed.  The pathology from the segment 6 and segment 8 lesions revealed no residual carcinoma, the segment 7 lesion revealed pools of mucin and associated calcifications with rare atypical cells-partial to complete response, extensive thermal/mechanical artifact with focal ink on mucin, margin interpreted as negative, though a positive margin could not be excluded Cycle 7 FOLFOX 08/27/2021 Cycle 8 FOLFOX 09/10/2021 Radiation/Xeloda 09/30/2021-11/07/2021 CTs 01/05/2022-multiple areas of hepatic wedge resection, no new or progressive disease, unchanged mid to high rectal thickening Robotic low anterior resection/ileostomy  01/15/2022-no residual carcinoma consistent with complete response, fibrosis consistent with treatment effect, negative margins,  0/15 nodes Loop ileostomy reversal 03/18/2021 CTs 06/23/2022-no evidence of metastatic disease, unchanged treated/resected liver lesions, unchanged small volume loculated fluid in the low pelvis CTs 12/24/2022-unchanged treated/resected hepatic lesions, no new lesions, no evidence of recurrent disease, similar mesorectal fluid Rectal bleeding and pain secondary to #1 yT0, ypN0 Chronic left sciatica Bipolar disorder Right testicular fullness noted on exam 04/21/2021-epididymal cyst? 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  The irregular bowel habits and perineal discomfort is likely related to surgery and radiation.  I recommend he follow-up with Dr. Norma Fredrickson for a surveillance colonoscopy and to discuss antiacid therapy.  He will return for an office visit and surveillance CTs in 6 months.  He would like to have the Port-A-Cath removed.  We referred him to interventional radiology for Port-A-Cath removal.  Thornton Papas, MD  12/29/2022  10:18 AM

## 2022-12-29 NOTE — Progress Notes (Signed)
PATIENT NAVIGATOR PROGRESS NOTE  Name: Jeremiah Allen Date: 12/29/2022 MRN: [id number]  DOB: 15-Apr-1985   Reason for visit:  F/U and need for follow up Colonoscopy after completing treatment.   Comments:  Call placed to Dr Horace Porteous office for pt to have colonoscopy after completing treatment for rectal cancer.  And to also discuss anti acid therapy  The scheduler Victorino Dike will follow up with pt in getting scheduled     Time spent counseling/coordinating care: 30-45 minutes

## 2023-01-09 IMAGING — DX DG ABDOMEN 2V
2 series · 2 of 2 positions shown · non-contrast
Comparison: None.

CLINICAL DATA: Lower abdominal pain.

EXAM:
ABDOMEN - 2 VIEW

[abdomen erect]
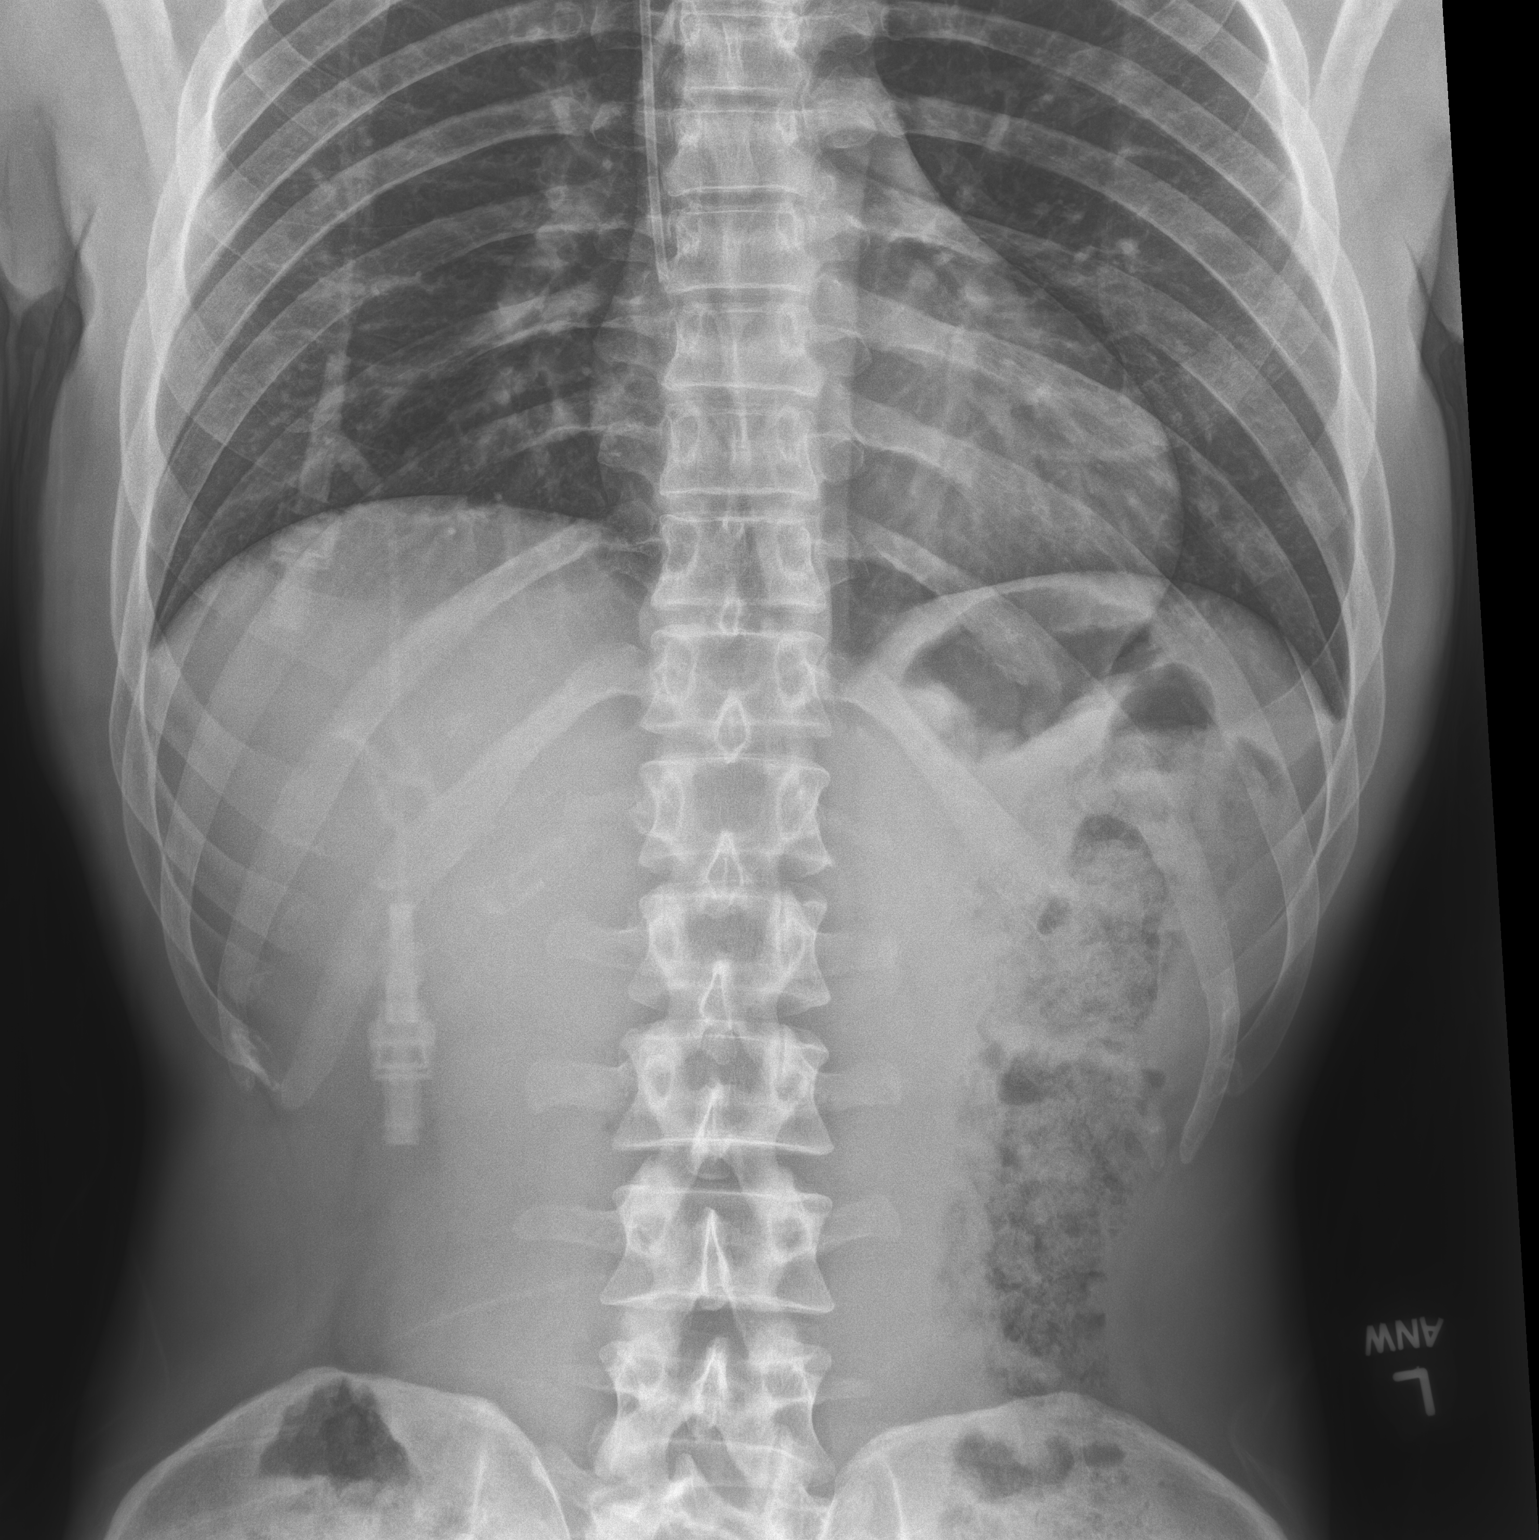

[abdomen supine]
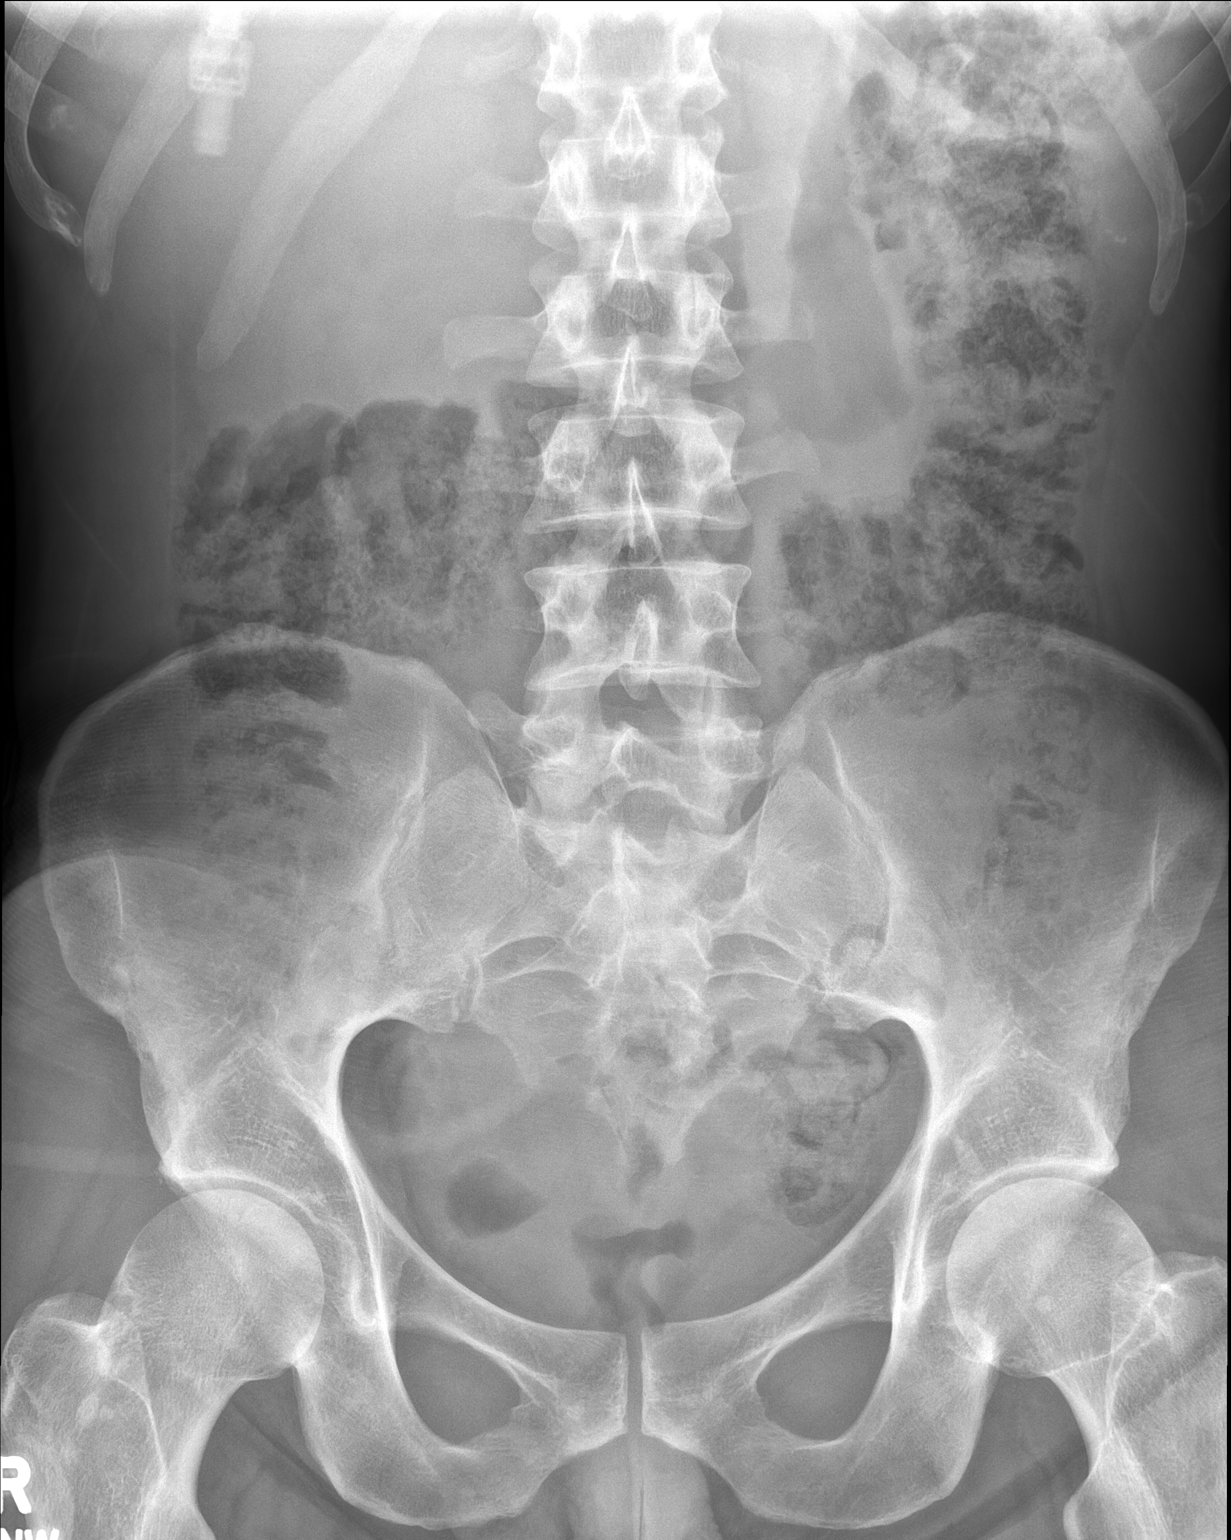

[2 of 2 positions shown; findings below may reference images not displayed]

FINDINGS: No abnormal bowel dilatation is noted. Moderate amount of stool seen
throughout the colon. There is no evidence of free air. No
radio-opaque calculi or other significant radiographic abnormality
is seen.
IMPRESSION: Moderate stool burden.  No abnormal bowel dilatation.

## 2023-01-13 ENCOUNTER — Ambulatory Visit (HOSPITAL_COMMUNITY)
Admission: RE | Admit: 2023-01-13 | Discharge: 2023-01-13 | Disposition: A | Discharge status: Home / Self Care | Payer: Medicaid Other | Source: Ambulatory Visit | Attending: Oncology | Admitting: Oncology

## 2023-01-13 ENCOUNTER — Ambulatory Visit (HOSPITAL_COMMUNITY): Payer: Medicaid Other | Admitting: Licensed Clinical Social Worker

## 2023-01-13 DIAGNOSIS — C785 Secondary malignant neoplasm of large intestine and rectum: Secondary | ICD-10-CM | POA: Diagnosis not present

## 2023-01-13 DIAGNOSIS — C20 Malignant neoplasm of rectum: Secondary | ICD-10-CM | POA: Insufficient documentation

## 2023-01-13 DIAGNOSIS — Z452 Encounter for adjustment and management of vascular access device: Secondary | ICD-10-CM | POA: Diagnosis not present

## 2023-01-13 DIAGNOSIS — F4325 Adjustment disorder with mixed disturbance of emotions and conduct: Secondary | ICD-10-CM

## 2023-01-13 DIAGNOSIS — C787 Secondary malignant neoplasm of liver and intrahepatic bile duct: Secondary | ICD-10-CM | POA: Diagnosis not present

## 2023-01-13 HISTORY — PX: IR REMOVAL TUN ACCESS W/ PORT W/O FL MOD SED: IMG2290

## 2023-01-13 MED ORDER — LIDOCAINE-EPINEPHRINE 1 %-1:100000 IJ SOLN
INTRAMUSCULAR | Status: AC
  Filled 2023-01-13: qty 1

## 2023-01-13 NOTE — Progress Notes (Signed)
THERAPIST PROGRESS NOTE  Virtual Visit via Video Note  I connected with Jeremiah Allen on 01/13/23 at  3:00 PM EST by a video enabled telemedicine application and verified that I am speaking with the correct person using two identifiers.  Location: Patient: [facility]  Provider: Provider office at [facility] health facility    I discussed the limitations of evaluation and management by telemedicine and the availability of in person appointments. The patient expressed understanding and agreed to proceed.     I discussed the assessment and treatment plan with the patient. The patient was provided an opportunity to ask questions and all were answered. The patient agreed with the plan and demonstrated an understanding of the instructions.   The patient was advised to call back or seek an in-person evaluation if the symptoms worsen or if the condition fails to improve as anticipated.  I provided 45 minutes of non-face-to-face time during this encounter.   [name], LCSW   Participation Level: Active  Behavioral Response: CasualAlertAnxious and Depressed  Type of Therapy: Individual Therapy  Treatment Goals addressed:  Active     Anxiety     LTG: Ronin will score less than 5 on the Generalized Anxiety Disorder 7 Scale (GAD-7)      Start:  10/29/22    Expected End:  04/02/23         STG: [name] will participate in at least 80% of scheduled individual psychotherapy sessions  (Progressing)     Start:  10/29/22    Expected End:  04/02/23         STG: [name] will complete at least 80% of assigned homework  (Progressing)     Start:  10/29/22    Expected End:  04/02/23         STG: [name] will practice problem solving skills 3 times per week for the next 4 weeks.  (Progressing)     Start:  10/29/22    Expected End:  04/02/23         identify 3 triggers for anxiety  (Progressing)     Start:  10/29/22    Expected End:  04/02/23       Goal Note      Illnesses for port for chemo and mental health  Relationship          Encourage Jaxs to take psychotropic medication(s) as prescribed     Start:  10/29/22         Review results of GAD-7 with Bryor to track progress     Start:  10/29/22         Perform psychoeducation regarding anxiety disorders     Start:  10/29/22       Intervention Note     Reviewed with patient during session.          Work with [name] to identify a minimum of 3 consequences of avoidance.      Start:  10/29/22         Work with [name] to identify a minimum of 3 alternative coping behaviors to avoidance.      Start:  10/29/22         Create a weekly activity schedule     Start:  10/29/22         Perform motivational interviewing regarding engagement and attendance with therapy     Start:  10/29/22         Perform motivational interviewing regarding completion of homework assignments  Start:  10/29/22         Perform motivational interviewing regarding use of tools     Start:  10/29/22         Perform motivational interviewing regarding physical activity     Start:  10/29/22         Perform motivational interviewing regarding medication adherence     Start:  10/29/22         Discuss risks and benefits of medication treatment options for this problem and prescribe as indicated (Completed)     Start:  10/29/22    End:  01/13/23      Work with [name] to track symptoms, triggers, and/or skill use through a mood chart, diary card, or journal (Completed)     Start:  10/29/22    End:  01/13/23      Provide [name] with educational information and reading material on anxiety, its causes, and symptoms.  (Completed)     Start:  10/29/22    End:  01/13/23      Work with patient individually to identify the major components of a recent episode of anxiety: physical symptoms, major thoughts and images, and major behaviors they experienced (Completed)     Start:  10/29/22     End:  01/13/23      Work with [name] to identify 3 personal goals for managing their anxiety to work on during current treatment.  (Completed)     Start:  10/29/22    End:  01/13/23        OP Depression     LTG: Goerge will score less than 5 on the Patient Health Questionnaire (PHQ-9)      Start:  10/29/22    Expected End:  04/02/23         identify 3 trigger for depression  (Progressing)     Start:  10/29/22    Expected End:  04/02/23       Goal Note     Financials          identify 3 coping skills for depression  (Progressing)     Start:  10/29/22    Expected End:  04/02/23         Encourage [name] to participate in recovery peer support activities weekly      Start:  10/29/22         Therapist will administer the PHQ-9 at weekly intervals for the next 8 weeks     Start:  10/29/22         Work with [name] to identify the major components of a recent episode of depression: physical symptoms, major thoughts and images, and major behaviors they experienced     Start:  10/29/22         Work with [name] to track symptoms, triggers, and/or skill use through a mood chart, diary card, or journal (Completed)     Start:  10/29/22    End:  01/13/23      Provide [name] educational information and reading material on dissociation, its causes, and symptoms (Completed)     Start:  10/29/22    End:  01/13/23         ProgressTowards Goals: Progressing  Interventions: CBT, Motivational Interviewing, Strength-based, Supportive, and Reframing  Summary: Shriyansh Podbielski is a 37 y.o. male who presents with depressed and anxious mood\affect.  Patient was pleasant, cooperative, maintained good eye contact.  He engaged well in therapy session was dressed casually.  Bryor was alert and  oriented x 5.  Patient comes in today with primary stressors as ex relationship.  Patient reports about 1 week after last session with this LCSW he broke up with his fiance of 6 years.   Patient reports that this has been going in that direction for some time and patient reports that he feels that they both need to work on themselves before they can work as a relationship.  Eril reports that he feels like he is putting in the work by engaging in therapy mental health services as well as trying to better himself in his career but is not see the same coming from his ex fianc.  Patient reports that there were incidents where she did not pay the electric bill or Internet bill for months at a time and then will call at the last minute to notify Zaahir that the services were going to be shut off in the next 24 hours.  Patient endorses symptoms for panic attack or tingling in his face, palpitations, and feeling like passing out.  Patient reports these panic attacks happen in short spurts for no more than 10 to 15 minutes at a time and will happen about twice a day.  Patient also reports lack of focus and restlessness throughout his day having trouble concentrating on things both at his job and at home.  Suicidal/Homicidal: Nowithout intent/plan  Therapist Response:    Intervention/Plan: LCSW supportive therapy for praise and encouragement.  LCSW used reframing for cognitive behavioral health therapy.  LCSW psycho analytic therapy for patient to express thoughts, feelings and emotions in session and nonjudgmental environment.  LCSW used person centered therapy for empowerment and encouraging patient to advocate for himself.  LCSW used strength-based therapy to discuss goals for the future such as finding individual housing and creating a documentary with a Interior and spatial designer about his cancer journey.  Plan: Return again in 4 weeks.  Diagnosis: No diagnosis found.  Collaboration of Care: Other None today   Patient/Guardian was advised Release of Information must be obtained prior to any record release in order to collaborate their care with an outside provider. Patient/Guardian was advised if they  have not already done so to contact the registration department to sign all necessary forms in order for [place] to release information regarding their care.   Consent: Patient/Guardian gives verbal consent for treatment and assignment of benefits for services provided during this visit. Patient/Guardian expressed understanding and agreed to proceed.   [name], LCSW 01/13/2023

## 2023-01-14 ENCOUNTER — Other Ambulatory Visit (HOSPITAL_BASED_OUTPATIENT_CLINIC_OR_DEPARTMENT_OTHER): Payer: Self-pay

## 2023-01-14 ENCOUNTER — Ambulatory Visit (INDEPENDENT_AMBULATORY_CARE_PROVIDER_SITE_OTHER): Payer: Medicaid Other | Admitting: Student

## 2023-01-14 DIAGNOSIS — F4325 Adjustment disorder with mixed disturbance of emotions and conduct: Secondary | ICD-10-CM | POA: Diagnosis not present

## 2023-01-14 DIAGNOSIS — F909 Attention-deficit hyperactivity disorder, unspecified type: Secondary | ICD-10-CM | POA: Diagnosis not present

## 2023-01-14 DIAGNOSIS — F1210 Cannabis abuse, uncomplicated: Secondary | ICD-10-CM | POA: Diagnosis not present

## 2023-01-14 MED ORDER — BUPROPION HCL ER (XL) 150 MG PO TB24
150.0000 mg | ORAL_TABLET | ORAL | 1 refills | Status: DC
  Filled 2023-01-14: qty 30, 30d supply, fill #0
  Filled 2023-03-02: qty 30, 30d supply, fill #1
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
## 2023-01-14 NOTE — Patient Instructions (Addendum)
ADHD Neuropsychiatric Testing for Adults:  [facility]  [phone] [address] [place], [place] [address] [phone]  [facility] [address] [place], [place] [address] Phone [phone]; Fax [phone] Contact January or February 2025 for Adult ADHD Testing  [facility]  [phone])  Several providers specializing in ADHD/Bipolar= Andrena Mews, PhD is expert at Adult ADHD, Verna Czech, PhD treats adults with both diagnoses.     [facility] [address],  [place], [place] [address] [phone]

## 2023-01-15 ENCOUNTER — Other Ambulatory Visit (HOSPITAL_BASED_OUTPATIENT_CLINIC_OR_DEPARTMENT_OTHER): Payer: Self-pay

## 2023-01-19 ENCOUNTER — Encounter (HOSPITAL_COMMUNITY): Payer: Self-pay | Admitting: Student

## 2023-01-21 ENCOUNTER — Other Ambulatory Visit (HOSPITAL_BASED_OUTPATIENT_CLINIC_OR_DEPARTMENT_OTHER): Payer: Self-pay

## 2023-01-21 DIAGNOSIS — C787 Secondary malignant neoplasm of liver and intrahepatic bile duct: Secondary | ICD-10-CM | POA: Diagnosis not present

## 2023-01-21 DIAGNOSIS — Z85048 Personal history of other malignant neoplasm of rectum, rectosigmoid junction, and anus: Secondary | ICD-10-CM | POA: Diagnosis not present

## 2023-01-21 DIAGNOSIS — C20 Malignant neoplasm of rectum: Secondary | ICD-10-CM | POA: Diagnosis not present

## 2023-01-21 MED ORDER — NA SULFATE-K SULFATE-MG SULF 17.5-3.13-1.6 GM/177ML PO SOLN
ORAL | 0 refills | Status: DC
  Filled 2023-01-21 – 2023-03-02 (×2): qty 354, 1d supply, fill #0

## 2023-01-24 NOTE — Addendum Note (Signed)
Addended by: Theodoro Kos A on: 01/24/2023 01:06 PM   Modules accepted: Level of Service

## 2023-02-09 ENCOUNTER — Other Ambulatory Visit (HOSPITAL_BASED_OUTPATIENT_CLINIC_OR_DEPARTMENT_OTHER): Payer: Self-pay

## 2023-02-12 ENCOUNTER — Ambulatory Visit (HOSPITAL_COMMUNITY): Payer: Medicaid Other | Admitting: Licensed Clinical Social Worker

## 2023-02-12 ENCOUNTER — Encounter (HOSPITAL_COMMUNITY): Payer: Self-pay

## 2023-02-25 ENCOUNTER — Encounter (HOSPITAL_COMMUNITY): Payer: Self-pay

## 2023-02-25 ENCOUNTER — Encounter (HOSPITAL_COMMUNITY): Payer: Medicaid Other | Admitting: Student

## 2023-03-01 ENCOUNTER — Ambulatory Visit (HOSPITAL_COMMUNITY): Payer: Medicaid Other | Admitting: Licensed Clinical Social Worker

## 2023-03-01 ENCOUNTER — Other Ambulatory Visit (HOSPITAL_BASED_OUTPATIENT_CLINIC_OR_DEPARTMENT_OTHER): Payer: Self-pay

## 2023-03-01 DIAGNOSIS — F4325 Adjustment disorder with mixed disturbance of emotions and conduct: Secondary | ICD-10-CM

## 2023-03-01 MED ORDER — COLESEVELAM HCL 625 MG PO TABS
1875.0000 mg | ORAL_TABLET | Freq: Two times a day (BID) | ORAL | 2 refills | Status: DC
  Filled 2023-03-01: qty 180, 30d supply, fill #0

## 2023-03-01 NOTE — Progress Notes (Signed)
THERAPIST PROGRESS NOTE  Virtual Visit via Video Note  I connected with Jeremiah Allen on 03/01/23 at  2:00 PM EST by a video enabled telemedicine application and verified that I am speaking with the correct person using two identifiers.  Location: Patient: Jeremiah Allen  Provider: Providers Home    I discussed the limitations of evaluation and management by telemedicine and the availability of in person appointments. The patient expressed understanding and agreed to proceed.     I discussed the assessment and treatment plan with the patient. The patient was provided an opportunity to ask questions and all were answered. The patient agreed with the plan and demonstrated an understanding of the instructions.   The patient was advised to call back or seek an in-person evaluation if the symptoms worsen or if the condition fails to improve as anticipated.  I provided 45 minutes of non-face-to-face time during this encounter.   [name], LCSW   Participation Level: Active  Behavioral Response: CasualAlertAnxious and Depressed  Type of Therapy: Individual Therapy  Treatment Goals addressed:  Active     Anxiety     LTG: Esco will score less than 5 on the Generalized Anxiety Disorder 7 Scale (GAD-7)      Start:  10/29/22    Expected End:  04/02/23         STG: Jeremiah Allen will participate in at least 80% of scheduled individual psychotherapy sessions  (Progressing)     Start:  10/29/22    Expected End:  04/02/23         STG: Jeremiah Allen will complete at least 80% of assigned homework  (Progressing)     Start:  10/29/22    Expected End:  04/02/23         STG: Jeremiah Allen will practice problem solving skills 3 times per week for the next 4 weeks.  (Progressing)     Start:  10/29/22    Expected End:  04/02/23         identify 3 triggers for anxiety  (Progressing)     Start:  10/29/22    Expected End:  04/02/23         Encourage Jeremiah Allen to take psychotropic  medication(s) as prescribed     Start:  10/29/22         Review results of GAD-7 with Jeremiah Allen to track progress     Start:  10/29/22         Perform psychoeducation regarding anxiety disorders     Start:  10/29/22       Intervention Note     Reviewed with patient during session.          Work with Jeremiah Allen to identify a minimum of 3 consequences of avoidance.      Start:  10/29/22         Work with Jeremiah Allen to identify a minimum of 3 alternative coping behaviors to avoidance.      Start:  10/29/22         Create a weekly activity schedule     Start:  10/29/22         Perform motivational interviewing regarding engagement and attendance with therapy     Start:  10/29/22         Perform motivational interviewing regarding completion of homework assignments     Start:  10/29/22         Perform motivational interviewing regarding use of tools     Start:  10/29/22  Perform motivational interviewing regarding physical activity     Start:  10/29/22         Perform motivational interviewing regarding medication adherence     Start:  10/29/22         Discuss risks and benefits of medication treatment options for this problem and prescribe as indicated (Completed)     Start:  10/29/22    End:  01/13/23      Work with Jeremiah Allen to track symptoms, triggers, and/or skill use through a mood chart, diary card, or journal (Completed)     Start:  10/29/22    End:  01/13/23      Provide Jeremiah Allen with educational information and reading material on anxiety, its causes, and symptoms.  (Completed)     Start:  10/29/22    End:  01/13/23      Work with patient individually to identify the major components of a recent episode of anxiety: physical symptoms, major thoughts and images, and major behaviors they experienced (Completed)     Start:  10/29/22    End:  01/13/23      Work with Jeremiah Allen to identify 3 personal goals for managing their anxiety to work on during  current treatment.  (Completed)     Start:  10/29/22    End:  01/13/23        OP Depression     LTG: [name] will score less than 5 on the Patient Health Questionnaire (PHQ-9)      Start:  10/29/22    Expected End:  04/02/23         identify 3 trigger for depression  (Progressing)     Start:  10/29/22    Expected End:  04/02/23         identify 3 coping skills for depression  (Progressing)     Start:  10/29/22    Expected End:  04/02/23         Encourage Jeremiah Allen to participate in recovery peer support activities weekly      Start:  10/29/22         Therapist will administer the PHQ-9 at weekly intervals for the next 8 weeks     Start:  10/29/22         Work with Jeremiah Allen to identify the major components of a recent episode of depression: physical symptoms, major thoughts and images, and major behaviors they experienced     Start:  10/29/22         Work with Jeremiah Allen to track symptoms, triggers, and/or skill use through a mood chart, diary card, or journal (Completed)     Start:  10/29/22    End:  01/13/23      Provide Jeremiah Allen educational information and reading material on dissociation, its causes, and symptoms (Completed)     Start:  10/29/22    End:  01/13/23         ProgressTowards Goals: Progressing  Interventions: CBT, Motivational Interviewing, and Supportive    Suicidal/Homicidal: Yeswithout intent/plan  Therapist Response:    Mithcell was alert and oriented x 5.  He presented today with irritable mood\affect.  He engaged well in therapy session was dressed casually.  Patient comes in today wanting LCSW that he was "pissed off".  Patient elevated his voice and went on a 10-minute rant about how he feels that the system his meant to fail him for being "too nice".  Patient utilized example of coming in for psychiatric medication evaluation at [facility].  He reports that multiple people that "just walked off the street" got in  before his scheduled appointment of 9 AM.   Patient endorses symptoms for irritability, frustration, mood swings, anger, resent fullness, and spite fullness.  He reports that he "needs something for his anxiety".  Patient reports that he has been dealing with his anxiety and ADHD for the last 35 years.  He states today that he was tolerating it as when he had cancer and it was not in remission he was preparing to not be here long-term.  Jaydin reports today that he is now in remission and knows that he will be here and needs to get his mental health under control.  Patient reports frustration with medication management team as they have requested a drug test from him.  Family reports that he has no problem taking the drug test but is frustrated that they made him wait a whole month in order to get it done.  He reports that he was not given an explanation of why that needed to be done and thinks with more clear communication his irritability would have gone down.  Intervention\plan:LCSW educated patient on how clear and concise communication can be useful to help decrease anxiety and depression.  LCSW educated patient on coming in with questions to all medical appointments and writing them down so that he is not overwhelmed forgetful of the questions he had prior to the appointments.  LCSW encouraged patient to advocate for himself utilizing MyChart if questions came up after appointments.  LCSW supportive therapy for praise and encouragement.  LCSW used cognitive restructuring and reframing for cognitive behavioral therapy.    Plan: Return again in 3 weeks.  Diagnosis: Adjustment disorder with mixed disturbance of emotions and conduct  Collaboration of Care: Other None today   Patient/Guardian was advised Release of Information must be obtained prior to any record release in order to collaborate their care with an outside provider. Patient/Guardian was advised if they have not already done so to contact  the registration department to sign all necessary forms in order for [place] to release information regarding their care.   Consent: Patient/Guardian gives verbal consent for treatment and assignment of benefits for services provided during this visit. Patient/Guardian expressed understanding and agreed to proceed.   [name], LCSW 03/01/2023

## 2023-03-02 ENCOUNTER — Other Ambulatory Visit (HOSPITAL_BASED_OUTPATIENT_CLINIC_OR_DEPARTMENT_OTHER): Payer: Self-pay

## 2023-03-02 ENCOUNTER — Other Ambulatory Visit: Payer: Self-pay

## 2023-03-03 ENCOUNTER — Other Ambulatory Visit (HOSPITAL_BASED_OUTPATIENT_CLINIC_OR_DEPARTMENT_OTHER): Payer: Self-pay

## 2023-03-03 ENCOUNTER — Ambulatory Visit (INDEPENDENT_AMBULATORY_CARE_PROVIDER_SITE_OTHER): Payer: Medicaid Other | Admitting: Student

## 2023-03-03 DIAGNOSIS — F4312 Post-traumatic stress disorder, chronic: Secondary | ICD-10-CM

## 2023-03-03 DIAGNOSIS — F1210 Cannabis abuse, uncomplicated: Secondary | ICD-10-CM

## 2023-03-03 DIAGNOSIS — F909 Attention-deficit hyperactivity disorder, unspecified type: Secondary | ICD-10-CM

## 2023-03-03 DIAGNOSIS — F902 Attention-deficit hyperactivity disorder, combined type: Secondary | ICD-10-CM | POA: Diagnosis not present

## 2023-03-03 MED ORDER — BUPROPION HCL ER (XL) 300 MG PO TB24
300.0000 mg | ORAL_TABLET | ORAL | 1 refills | Status: DC
Start: 2023-03-03 — End: 2023-04-22
  Filled 2023-03-03 – 2023-03-22 (×2): qty 30, 30d supply, fill #0
  Filled 2023-04-12 – 2023-04-17 (×3): qty 30, 30d supply, fill #1

## 2023-03-05 ENCOUNTER — Other Ambulatory Visit (HOSPITAL_BASED_OUTPATIENT_CLINIC_OR_DEPARTMENT_OTHER): Payer: Self-pay

## 2023-03-05 ENCOUNTER — Ambulatory Visit (INDEPENDENT_AMBULATORY_CARE_PROVIDER_SITE_OTHER): Payer: Medicaid Other

## 2023-03-05 DIAGNOSIS — Z85048 Personal history of other malignant neoplasm of rectum, rectosigmoid junction, and anus: Secondary | ICD-10-CM | POA: Diagnosis not present

## 2023-03-05 DIAGNOSIS — C787 Secondary malignant neoplasm of liver and intrahepatic bile duct: Secondary | ICD-10-CM | POA: Diagnosis not present

## 2023-03-05 DIAGNOSIS — Z09 Encounter for follow-up examination after completed treatment for conditions other than malignant neoplasm: Secondary | ICD-10-CM | POA: Diagnosis not present

## 2023-03-05 DIAGNOSIS — Z980 Intestinal bypass and anastomosis status: Secondary | ICD-10-CM | POA: Diagnosis not present

## 2023-03-05 DIAGNOSIS — Z860101 Personal history of adenomatous and serrated colon polyps: Secondary | ICD-10-CM | POA: Diagnosis not present
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
## 2023-03-07 ENCOUNTER — Other Ambulatory Visit (HOSPITAL_BASED_OUTPATIENT_CLINIC_OR_DEPARTMENT_OTHER): Payer: Self-pay

## 2023-03-08 ENCOUNTER — Other Ambulatory Visit (HOSPITAL_BASED_OUTPATIENT_CLINIC_OR_DEPARTMENT_OTHER): Payer: Self-pay

## 2023-03-08 MED ORDER — COLESTIPOL HCL 1 G PO TABS
1.0000 g | ORAL_TABLET | Freq: Three times a day (TID) | ORAL | 3 refills | Status: AC
  Filled 2023-03-08: qty 90, 30d supply, fill #0
  Filled 2023-04-16 – 2023-04-17 (×2): qty 90, 30d supply, fill #1

## 2023-03-10 ENCOUNTER — Other Ambulatory Visit: Payer: Self-pay

## 2023-03-11 ENCOUNTER — Other Ambulatory Visit: Payer: Self-pay

## 2023-03-12 ENCOUNTER — Other Ambulatory Visit (HOSPITAL_BASED_OUTPATIENT_CLINIC_OR_DEPARTMENT_OTHER): Payer: Self-pay

## 2023-03-12 ENCOUNTER — Other Ambulatory Visit: Payer: Self-pay

## 2023-03-15 ENCOUNTER — Other Ambulatory Visit (HOSPITAL_BASED_OUTPATIENT_CLINIC_OR_DEPARTMENT_OTHER): Payer: Self-pay

## 2023-03-18 ENCOUNTER — Emergency Department (HOSPITAL_COMMUNITY)
Admission: EM | Admit: 2023-03-18 | Discharge: 2023-03-18 | Disposition: A | Discharge status: Other Acute Inpatient Hospital | Payer: Medicaid Other | Attending: Emergency Medicine | Admitting: Emergency Medicine

## 2023-03-18 ENCOUNTER — Other Ambulatory Visit (HOSPITAL_COMMUNITY)
Admission: EM | Admit: 2023-03-18 | Discharge: 2023-03-19 | Disposition: A | Discharge status: Home / Self Care | Payer: Medicaid Other | Source: Home / Self Care

## 2023-03-18 ENCOUNTER — Encounter (HOSPITAL_COMMUNITY): Payer: Self-pay | Admitting: Emergency Medicine

## 2023-03-18 ENCOUNTER — Other Ambulatory Visit: Payer: Self-pay

## 2023-03-18 ENCOUNTER — Emergency Department (HOSPITAL_COMMUNITY): Payer: Medicaid Other

## 2023-03-18 DIAGNOSIS — T50904A Poisoning by unspecified drugs, medicaments and biological substances, undetermined, initial encounter: Secondary | ICD-10-CM | POA: Diagnosis not present

## 2023-03-18 DIAGNOSIS — F1520 Other stimulant dependence, uncomplicated: Secondary | ICD-10-CM | POA: Diagnosis not present

## 2023-03-18 DIAGNOSIS — R001 Bradycardia, unspecified: Secondary | ICD-10-CM | POA: Diagnosis not present

## 2023-03-18 DIAGNOSIS — F902 Attention-deficit hyperactivity disorder, combined type: Secondary | ICD-10-CM | POA: Diagnosis present

## 2023-03-18 DIAGNOSIS — F69 Unspecified disorder of adult personality and behavior: Secondary | ICD-10-CM | POA: Insufficient documentation

## 2023-03-18 DIAGNOSIS — F1220 Cannabis dependence, uncomplicated: Secondary | ICD-10-CM | POA: Diagnosis present

## 2023-03-18 DIAGNOSIS — F4325 Adjustment disorder with mixed disturbance of emotions and conduct: Secondary | ICD-10-CM | POA: Diagnosis present

## 2023-03-18 DIAGNOSIS — F919 Conduct disorder, unspecified: Secondary | ICD-10-CM | POA: Diagnosis not present

## 2023-03-18 DIAGNOSIS — R442 Other hallucinations: Secondary | ICD-10-CM | POA: Diagnosis not present

## 2023-03-18 DIAGNOSIS — F1910 Other psychoactive substance abuse, uncomplicated: Secondary | ICD-10-CM | POA: Insufficient documentation

## 2023-03-18 DIAGNOSIS — Z9151 Personal history of suicidal behavior: Secondary | ICD-10-CM | POA: Diagnosis not present

## 2023-03-18 DIAGNOSIS — F1521 Other stimulant dependence, in remission: Secondary | ICD-10-CM | POA: Diagnosis present

## 2023-03-18 DIAGNOSIS — Z85048 Personal history of other malignant neoplasm of rectum, rectosigmoid junction, and anus: Secondary | ICD-10-CM | POA: Diagnosis not present

## 2023-03-18 DIAGNOSIS — F1510 Other stimulant abuse, uncomplicated: Secondary | ICD-10-CM

## 2023-03-18 DIAGNOSIS — R45851 Suicidal ideations: Secondary | ICD-10-CM | POA: Diagnosis not present

## 2023-03-18 DIAGNOSIS — R0789 Other chest pain: Secondary | ICD-10-CM | POA: Diagnosis not present

## 2023-03-18 DIAGNOSIS — R0602 Shortness of breath: Secondary | ICD-10-CM | POA: Diagnosis not present

## 2023-03-18 DIAGNOSIS — R079 Chest pain, unspecified: Secondary | ICD-10-CM

## 2023-03-18 DIAGNOSIS — F1310 Sedative, hypnotic or anxiolytic abuse, uncomplicated: Secondary | ICD-10-CM | POA: Diagnosis not present

## 2023-03-18 DIAGNOSIS — R202 Paresthesia of skin: Secondary | ICD-10-CM | POA: Insufficient documentation

## 2023-03-18 DIAGNOSIS — Z9221 Personal history of antineoplastic chemotherapy: Secondary | ICD-10-CM | POA: Insufficient documentation

## 2023-03-18 DIAGNOSIS — T887XXA Unspecified adverse effect of drug or medicament, initial encounter: Secondary | ICD-10-CM | POA: Diagnosis not present

## 2023-03-18 DIAGNOSIS — F4310 Post-traumatic stress disorder, unspecified: Secondary | ICD-10-CM | POA: Diagnosis not present

## 2023-03-18 LAB — COMPREHENSIVE METABOLIC PANEL
ALT: 23 U/L (ref 0–44)
AST: 29 U/L (ref 15–41)
Albumin: 4.4 g/dL (ref 3.5–5.0)
Alkaline Phosphatase: 66 U/L (ref 38–126)
Anion gap: 12 (ref 5–15)
BUN: 15 mg/dL (ref 6–20)
CO2: 21 mmol/L — ABNORMAL LOW (ref 22–32)
Calcium: 9.4 mg/dL (ref 8.9–10.3)
Chloride: 103 mmol/L (ref 98–111)
Creatinine, Ser: 1.04 mg/dL (ref 0.61–1.24)
GFR, Estimated: >60 mL/min (ref >60)
Glucose, Bld: 103 mg/dL — ABNORMAL HIGH (ref 70–99)
Potassium: 3.4 mmol/L — ABNORMAL LOW (ref 3.5–5.1)
Sodium: 136 mmol/L (ref 135–145)
Total Bilirubin: 0.9 mg/dL (ref 0.0–1.2)
Total Protein: 7.5 g/dL (ref 6.5–8.1)

## 2023-03-18 LAB — CBC
HCT: 40.4 % (ref 39.0–52.0)
Hemoglobin: 13.6 g/dL (ref 13.0–17.0)
MCH: 31.3 pg (ref 26.0–34.0)
MCHC: 33.7 g/dL (ref 30.0–36.0)
MCV: 93.1 fL (ref 80.0–100.0)
Platelets: 219 10*3/uL (ref 150–400)
RBC: 4.34 MIL/uL (ref 4.22–5.81)
RDW: 12.6 % (ref 11.5–15.5)
WBC: 5.8 10*3/uL (ref 4.0–10.5)
nRBC: 0 % (ref 0.0–0.2)

## 2023-03-18 LAB — RAPID URINE DRUG SCREEN, HOSP PERFORMED
Amphetamines: POSITIVE — AB
Barbiturates: NOT DETECTED
Benzodiazepines: NOT DETECTED
Cocaine: NOT DETECTED
Opiates: NOT DETECTED
Tetrahydrocannabinol: POSITIVE — AB

## 2023-03-18 LAB — SALICYLATE LEVEL: Salicylate Lvl: <7 mg/dL — ABNORMAL LOW (ref 7.0–30.0)

## 2023-03-18 LAB — ACETAMINOPHEN LEVEL: Acetaminophen (Tylenol), Serum: <10 ug/mL — ABNORMAL LOW (ref 10–30)

## 2023-03-18 LAB — TROPONIN I (HIGH SENSITIVITY)
Troponin I (High Sensitivity): 3 ng/L (ref <18)
Troponin I (High Sensitivity): 3 ng/L (ref <18)

## 2023-03-18 LAB — ETHANOL: Alcohol, Ethyl (B): <10 mg/dL (ref <10)

## 2023-03-18 LAB — SARS CORONAVIRUS 2 BY RT PCR: SARS Coronavirus 2 by RT PCR: NEGATIVE

## 2023-03-18 MED ORDER — BUPROPION HCL ER (XL) 150 MG PO TB24
300.0000 mg | ORAL_TABLET | Freq: Every day | ORAL | Status: DC
  Administered 2023-03-18: 300 mg via ORAL
  Filled 2023-03-18: qty 2

## 2023-03-18 MED ORDER — TRAZODONE HCL 100 MG PO TABS: 100.0000 mg | ORAL_TABLET | Freq: Every evening | ORAL | Status: DC | PRN

## 2023-03-18 NOTE — BH Assessment (Signed)
Social Work Disposition Note:  Per Arsenio Loader, NP, we recommend inpatient psychiatric hospitalization when medically cleared. Patient is under voluntary admission status at this time; please IVC if attempts to leave hospital. Patient referred to [facility] for consideration of bed placement. [facility] AC Richelle Ito, RN) reviewing patient.

## 2023-03-18 NOTE — ED Notes (Signed)
Pt wanded

## 2023-03-18 NOTE — ED Notes (Signed)
Belongings placed in locker number 2.

## 2023-03-18 NOTE — Consult Note (Cosign Needed Addendum)
[facility] Psychiatric Consult Initial  Patient Name: .Jeremiah Allen  MRN: [id number]  DOB: 04-Jan-1986  Consult Order details:  Orders (From admission, onward)     Start     Ordered   03/18/23 1417  CONSULT TO CALL ACT TEAM       Ordering Provider: Benjiman Core, MD  Provider:  (Not yet assigned)  Question:  Reason for Consult?  Answer:  Psych consult   03/18/23 1416             Mode of Visit: In person    Psychiatry Consult Evaluation  Service Date: March 18, 2023 LOS:  LOS: 0 days  Chief Complaint: "I lost my dream job so I just sort of lost it and replaced"  Primary Psychiatric Diagnoses  Adjustment disorder with mixed disturbance of emotions and conduct 2.  Cannabis use disorder, severe, dependence 3.  Methamphetamine use disorder, severe 4.  ADHD combined type  Assessment   Jeremiah Allen is a 38 y.o. Caucasian male with a past psychiatric history of bipolar depression, PTSD, ADHD combined type, polysubstance abuse (I.e., methamphetamine use disorder, cannabis use disorder), and adjustment disorder, with pertinent medical comorbidities/history that include stage IV colorectal cancer that is now in remission, who presented this encounter by way of EMS for the initial complaint of chest pain after utilizing methamphetamines, but upon EDP evaluation, endorsed suicidal ideations, thus psychiatry was consulted.  Patient is currently voluntary at this time and medically cleared per EDP team.  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 Evidence of this is appreciable from the patient's endorsements of recently losing a dream job of his that he has been working towards since the age of 35, which led to a relapse on methamphetamines, and an attempt at suicide by way  of attempted overdose, just prior to this encounter. There is also evidence to support the patient's current and historical diagnoses of cannabis/methamphetamine use disorder severe and ADHD combined type.  Given the patient's recent suicide attempt, not sleeping for the last 3 days, and endorsements of still having some difficulties with affective instability, given the recent dream job loss, discussed with patient that the recommendation will be for inpatient mental health hospitalization, for safety and stabilization of the patient.  At this time, will restart the patient's outpatient medication, but will also place as needed medications to help facilitate sleep.  Diagnoses:  Active Hospital problems: Principal Problem:   Adjustment disorder with mixed disturbance of emotions and conduct Active Problems:   Cannabis use disorder, severe, dependence (HCC)   Methamphetamine use disorder, severe (HCC)   ADHD (attention deficit hyperactivity disorder), combined type    Plan   # Adjustment this order with mixed disturbance of emotions and conduct # Cannabis use disorder severe dependence # Methamphetamine use disorder severe # ADHD combined type  ## Psychiatric Medication Recommendations:   -Continue Wellbutrin 300 mg p.o. daily -Trazodone 100 mg p.o. nightly as needed  ## Medical Decision Making Capacity: Not specifically addressed in this encounter  ## Further Work-up: UDS  ## Disposition:-- We recommend inpatient psychiatric hospitalization when medically cleared. Patient is under voluntary admission status at this time; please IVC if attempts to leave hospital.  ## Behavioral / Environmental: -Safety/agitation precautions    ## Safety and Observation Level:  - Based on my clinical evaluation, I estimate the patient  to be at low risk of self harm in the current setting. - At this time, we recommend  1:1 Observation. This decision is based on my review of the chart including  patient's history and current presentation, interview of the patient, mental status examination, and consideration of suicide risk including evaluating suicidal ideation, plan, intent, suicidal or self-harm behaviors, risk factors, and protective factors. This judgment is based on our ability to directly address suicide risk, implement suicide prevention strategies, and develop a safety plan while the patient is in the clinical setting. Please contact our team if there is a concern that risk level has changed.  CSSR Risk Category:C-SSRS RISK CATEGORY: Error: Q2 is Yes, you must answer 3, 4, and 5  Suicide Risk Assessment: Patient has following modifiable risk factors for suicide: recklessness and medication noncompliance, which we are addressing by treatment recommendations. Patient has following non-modifiable or demographic risk factors for suicide: male gender, separation or divorce, and history of suicide attempt Patient has the following protective factors against suicide: Access to outpatient mental health care, Supportive family, Supportive friends, Pets in the home, and no history of NSSIB  Thank you for this consult request. Recommendations have been communicated to the primary team.  We will continue to follow at this time.   Lenox Ponds, NP       History of Present Illness   Jeremiah Allen is a 38 y.o. Caucasian male with a past psychiatric history of bipolar depression, PTSD, ADHD combined type, polysubstance abuse (I.e., methamphetamine use disorder, cannabis use disorder), and adjustment disorder, with pertinent medical comorbidities/history that include stage IV colorectal cancer that is now in remission, who presented this encounter by way of EMS for the initial complaint of chest pain after utilizing methamphetamines, but upon EDP evaluation, endorsed suicidal ideations, thus psychiatry was consulted.  Patient is currently voluntary at this time and medically cleared per  EDP team.  Patient seen today at the [facility] emergency department for face-to-face psychiatric evaluation.  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    Patient endorses that historically he does not get chest pain from using methamphetamines, but states that this time he did, because after staying up since Saturday and using methamphetamines all day and night, states that just prior to coming in he attempted to take a lethal dose of methamphetamines in order to end his life.  Patient endorses that chronically he, "always feel suicidal, I felt suicidal continuously since I was about 38 years old", but that because of having to, "face" the thoughts of losing his dream job, states that in his intoxication and anger he just wanted to, "end it all".  Patient endorses that he has attempted suicide two others times, states that he attempted to stab himself in the chest in his 39s, and at the age of 32 when he was in the middle of chemo he attempted to overdose. Patient denies a history of self-injurious behavior in the form of cutting or other methods.  Patient endorses he has a long history of methamphetamine IV drug use, states that he started using around 2022 when he got cancer, but up until  the events that transpired which led to this encounter, states that he had been sober from methamphetamines for approximately 7 months.  Patient endorses in addition to methamphetamine use, has a long history of cannabis use, states that he has been smoking cannabis daily since he was about 38 years of age, states that it calms his mind and helps him with anxiety. Patient endorses  no other drug use, denies EtOH use, endorses tobacco smoking about 10 cigarettes a day since 2014.  UDS appreciably still pending; BAL unremarkable.  Patient endorses no other psychosocial stressors in his life, but does state that he lives on disability which is a fixed income for his colorectal cancer that can be stressful, and that he and his ex fianc separated in November but they still live together currently for financial reasons. Patient endorses no history of inpatient mental health hospitalizations, but endorses utilization of outpatient mental health services through the [facility] for medication management/therapy.  Patient endorses that his outpatient medication of Wellbutrin is normally very effective for him for his psychiatric conditions that he is treated for, but the recent events that transpired have, "just really messed me up". Patient endorses that outside of the recent events that transpired he normally is relatively stable and in mental health, outside of intermittently at times, "getting angry about being angry", when things happen that are unfortunate in his life. Patient orientation is intact, no concerns for fluctuations in consciousness.    Patient endorses admittingly that inpatient mental health hospitalization recommendation is appropriate at this time, states that he does still feel like he is struggling with affective instability, given that he has not slept since Saturday, and losing his dream job. Patient endorses currently he is not feeling "anything more than usual chronic suicidal thoughts".  Patient denies homicidal ideations, denies auditory or visual hallucinations, and objectively, does not appear to be presenting with psychotic features. Patient additionally denies paranoia and does not appear to be presenting with delusional themes.  Patient endorses that he normally eats fairly well, but since Saturday when he relapsed on methamphetamines, has not been able to eat hardly  at all.  Collateral, patient's ex fianc, Ms. Johnney Ou, spoken to at [phone]  Call placed and extensive conversation held with the patient's ex fianc, whom the patient lives with.  Patient's ex fianc reports that it is true, the patient told her that he was attempting to overdose on methamphetamines to end his life, states that she is concerned for his safety. Discussed with the patient's ex fianc that for safety the recommendation will be for inpatient mental health hospitalization, to which she agreed, and supports the patient getting the help that he needs.  Review of Systems  Musculoskeletal:  Positive for myalgias.  Psychiatric/Behavioral:  Positive for substance abuse (Meth, cannabis) and suicidal ideas (Chronically). Negative for depression and hallucinations. The patient is nervous/anxious and has insomnia (Reports hasn't slept in three days).   All other systems reviewed and are negative.   Psychiatric and Social History  Psychiatric History:   Information collected from chart review/patient  Prev Dx/Sx: Polysubstance abuse, PTSD, adjustment disorder, bipolar, ADHD combined type Current Psych Provider: [facility] Alf[facility] Meds (current): Wellbutrin 300 Previous Med Trials: Wellbutrin 300 Therapy: [facility]   Prior Psych Hospitalization: None endorsed Prior Self Harm: X 3 suicide attempts, most recent one just prior to this encounter by way of attempting to overdose on methamphetamines Prior Violence: None endorsed  Family Psych History: None endorsed Family Hx suicide:  None endorsed  Social History:  Developmental Hx: WDL Educational Hx: WDL Occupational Hx: On disability for colorectal cancer Legal Hx: None endorsed Living Situation: Lives with ex fianc on disability Spiritual Hx: None endorsed  Access to weapons/lethal means: None endorsed   Substance History Alcohol: None endorsed  Type of alcohol None endorsed Last Drink None endorsed Number of  drinks per day None endorsed History of alcohol withdrawal seizures None endorsed History of DT's None endorsed Tobacco: Daily, smokes about 10 cigarettes a day since 2014 Illicit drugs: Cannabis, methamphetamine use disorder Prescription drug abuse: None endorsed Rehab hx: History of NA for 2 years  Exam Findings  Physical Exam: As below Vital Signs:  Temp:  [97.9 F (36.6 C)-98.3 F (36.8 C)] 97.9 F (36.6 C) (02/13 1603) Pulse Rate:  [71-73] 71 (02/13 1603) Resp:  [16-18] 18 (02/13 1603) BP: (120-131)/(70-81) 120/81 (02/13 1603) SpO2:  [98 %-100 %] 98 % (02/13 1603) Weight:  [65.8 kg] 65.8 kg (02/13 1028) Blood pressure 120/81, pulse 71, temperature 97.9 F (36.6 C), temperature source Oral, resp. rate 18, height 5\' 9"  (1.753 m), weight 65.8 kg, SpO2 98%. Body mass index is 21.41 kg/m.  Physical Exam Vitals and nursing note reviewed.  Constitutional:      General: He is not in acute distress.    Appearance: He is not ill-appearing, toxic-appearing or diaphoretic.     Comments: Hyperactive and anxious interpersonal style with agitated edge  Pulmonary:     Effort: Pulmonary effort is normal.  Skin:    General: Skin is warm and dry.  Neurological:     Mental Status: He is oriented to person, place, and time.     Motor: No tremor or seizure activity.  Psychiatric:        Attention and Perception: Attention and perception normal. He does not perceive auditory or visual hallucinations.        Speech: Speech is rapid and pressured and tangential.        Behavior: Behavior is agitated and hyperactive. Behavior is not slowed, aggressive, withdrawn or combative. Behavior is cooperative.        Thought Content: Thought content is not paranoid or delusional. Thought content includes suicidal (Chronically) ideation. Thought content does not include homicidal ideation.        Cognition and Memory: Cognition and memory normal.        Judgment: Judgment is impulsive and inappropriate.     Mental Status Exam: General Appearance:  Anxious and hyperactive interpersonal style with agitated edge  Orientation:  Full (Time, Place, and Person)  Memory:   WDL  Concentration:  Concentration: Fair and Attention Span: Fair  Recall:  Fair  Attention  Fair  Eye Contact:  Good  Speech:  Clear and Coherent and Pressured  Language:  Good  Volume:  Normal  Mood: Agitated/frustrated/anxious  Affect:   Neutral with irritable edge  Thought Process:  Coherent; largely linear to circumstantial, intermittently tangential  Thought Content:  Logical  Suicidal Thoughts:  Yes, chronic  Homicidal Thoughts:  No  Judgement:  Intact  Insight:  Lacking, Present, and Shallow  Psychomotor Activity:  Increased  Akathisia:  No  Fund of Knowledge:  Fair      Assets:  Manufacturing systems engineer Desire for Improvement Financial Resources/Insurance Housing Leisure Time Physical Health Resilience Social Support Talents/Skills Transportation Vocational/Educational  Cognition:  WNL  ADL's:  Intact  AIMS (if indicated):   0     Other History   These have been pulled in  through the EMR, reviewed, and updated if appropriate.  Family History:  The patient's family history includes Asthma in his sister; Developmental delay in his brother; Heart disease in his mother; Miscarriages / Stillbirths in his sister; Stroke in his sister.  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    Surgical History: Past Surgical History:  Procedure Laterality Date   DIVERTING ILEOSTOMY N/A 01/15/2022   Procedure: DIVERTING ILEOSTOMY;  Surgeon: Romie Levee, MD;  Location: [facility];  Service: General;  Laterality: N/A;   FLEXIBLE SIGMOIDOSCOPY N/A 04/16/2021   Procedure: FLEXIBLE SIGMOIDOSCOPY;  Surgeon: Toledo,  Boykin Nearing, MD;  Location: [facility];  Service: Gastroenterology;  Laterality: N/A;   ILEOSTOMY CLOSURE N/A 03/18/2022   Procedure: LOOP ILEOSTOMY REVERSAL;  Surgeon: Romie Levee, MD;  Location: [facility];  Service: General;  Laterality: N/A;   IR IMAGING GUIDED PORT INSERTION  04/28/2021   IR REMOVAL TUN ACCESS W/ PORT W/O FL MOD SED  01/13/2023   LAPAROSCOPY N/A 08/04/2021   Procedure: STAGING LAPAROSCOPY WITH INTRAOPERATIVE ULTRASOUND;  Surgeon: Fritzi Mandes, MD;  Location: [facility];  Service: General;  Laterality: N/A;   OPEN PARTIAL HEPATECTOMY  N/A 08/04/2021   Procedure: OPEN PARTIAL HEPATECTOMY;  Surgeon: Fritzi Mandes, MD;  Location: [facility];  Service: General;  Laterality: N/A;   WISDOM TOOTH EXTRACTION     XI ROBOTIC ASSISTED LOWER ANTERIOR RESECTION N/A 01/15/2022   Procedure: XI ROBOTIC ASSISTED LOWER ANTERIOR RESECTION;  Surgeon: Romie Levee, MD;  Location: [facility];  Service: General;  Laterality: N/A;     Medications:  No current facility-administered medications for this encounter.  Current Outpatient Medications:    acetaminophen (TYLENOL) 500 MG tablet, Take 500 mg by mouth 2 (two) times daily as needed for moderate pain (pain score 4-6), headache or fever., Disp: , Rfl:    buPROPion (WELLBUTRIN XL) 150 MG 24 hr tablet, Take 300 mg by mouth daily., Disp: , Rfl:    colestipol (COLESTID) 1 g tablet, Take 1 tablet (1 g total) by mouth 3 (three) times daily as needed (diarrhea/fecal urgency). Do not take within 2 hours of other medications., Disp: 90 tablet, Rfl: 3   buPROPion (WELLBUTRIN XL) 300 MG 24 hr tablet, Take 1 tablet (300 mg total) by mouth every morning. (Patient not taking: Reported on 03/18/2023), Disp: 30 tablet, Rfl: 1   colesevelam (WELCHOL) 625 MG tablet, Take 3 tablets (1,875 mg total) by mouth 2 (two) times daily. Take with meals. (Patient not taking: Reported on 03/18/2023), Disp: 180 tablet, Rfl: 2  Allergies: Allergies  Allergen Reactions   Crab  Extract Anaphylaxis and Swelling    Throat swelling  Reaction only to crab, no other shellfish   Dilaudid [Hydromorphone] Itching and Other (See Comments)    Skin tingling sensations    Seroquel [Quetiapine] Other (See Comments)    Skin tingling/crawling sensation    Lenox Ponds, NP

## 2023-03-18 NOTE — BH Assessment (Addendum)
Social Work Disposition Note:   Per [facility] AC, Malva Limes, RN, we can accept this pt to [facility]. FACILITY BASED CRISIS UNIT. ADDRESS :[address]  [place]. [address]. RN TO RN REPORT TO [phone]. ATTENDING MD GOLI. pt must sign a voluntary consent and fax that to [phone] and send original with patient at transport if not using the ECONSENT. DX Adjustment D.o. with mixed disturbance , Methamphetamiine Use D.O. Cannabis use D.O.   Patient's care team Lovie Macadamia, RN and Arsenio Loader, NP)  provided disposition updates.

## 2023-03-18 NOTE — ED Triage Notes (Signed)
EMS stated, he has chest pain and took Meth this morning.

## 2023-03-18 NOTE — ED Provider Notes (Addendum)
[place] EMERGENCY DEPARTMENT AT [facility] Provider Note   CSN: [id number] Arrival date & time: 03/18/23  1016     History  Chief Complaint  Patient presents with   Chest Pain   Addiction Problem   Arm Pain   Suicidal    Jeremiah Allen is a 38 y.o. male.   Chest Pain Arm Pain Associated symptoms include chest pain.  Patient reportedly had recent meth use.  Has chest pain and numbness in the back of his hands.  Had numbness of the hands before.  Had been relatively meth free until recently to start using them.  Has been injecting over the last few days.  No headache.  Does have chest tightness. Brought in also reported suicidal thoughts.  States he is chronically suicidal but roommate called because they thought things were worse.    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 Authorizing Provider  acetaminophen (TYLENOL) 500 MG tablet Take 500-1,000 mg by mouth every 6 (six) hours as needed for mild pain (pain score 1-3) or moderate pain (pain score 4-6).    [provider]  buPROPion (WELLBUTRIN XL) 300 MG 24 hr tablet Take 1 tablet (300 mg total) by mouth every morning. 03/03/23 05/02/23  Elsie Lincoln, MD  colesevelam (WELCHOL) 625 MG tablet Take 3 tablets (1,875 mg total) by mouth 2 (two) times daily. Take with meals. 03/01/23     colestipol (COLESTID) 1 g tablet Take 1 tablet (1 g total) by mouth 3 (three) times daily as needed (diarrhea/fecal urgency). Do not take within 2 hours of other medications. 03/08/23     famotidine (PEPCID) 20 MG tablet Take 20 mg by mouth 2 (two) times daily as needed for heartburn or indigestion.    [provider]  loperamide  (IMODIUM) 2 MG capsule Take 4 mg by mouth as needed for diarrhea or loose stools. Takes #2 every morning    [provider]  Na Sulfate-K Sulfate-Mg Sulfate concentrate 17.5-3.13-1.6 GM/177ML SOLN Take both bottles at the times instructed by your provider. 01/21/23         Allergies    Crab extract and Dilaudid [hydromorphone]    Review of Systems   Review of Systems  Cardiovascular:  Positive for chest pain.    Physical Exam Updated Vital Signs BP 131/70 (BP Location: Right Arm)   Pulse 73   Temp 98.3 F (36.8 C) (Oral)   Resp 16   Ht 5\' 9"  (1.753 m)   Wt 65.8 kg   SpO2 100%   BMI 21.41 kg/m  Physical Exam Vitals and nursing note reviewed.  Cardiovascular:     Rate and Rhythm: Regular rhythm.  Pulmonary:     Breath sounds: No decreased breath sounds or wheezing.  Chest:     Chest wall: No tenderness.  Abdominal:     Tenderness: There is no abdominal tenderness.  Musculoskeletal:     Right lower leg: No edema.     Left lower leg: No edema.  Neurological:     Mental Status: He is alert.   May have mild paresthesias to dorsum of bilateral hands.  Strength intact.  Few small injection sites antecubital areas.  ED Results / Procedures / Treatments   Labs (all labs ordered are listed, but only abnormal results are displayed) Labs Reviewed  COMPREHENSIVE METABOLIC PANEL - Abnormal; Notable for the following components:      Result Value   Potassium 3.4 (*)    CO2 21 (*)    Glucose, Bld 103 (*)    All other components within normal limits  SALICYLATE LEVEL - Abnormal; Notable for the following components:   Salicylate Lvl <7.0 (*)    All other components within normal limits  ACETAMINOPHEN LEVEL - Abnormal; Notable for the following components:   Acetaminophen (Tylenol), Serum <10 (*)    All other components within normal limits  CULTURE, BLOOD (ROUTINE X 2)  CULTURE, BLOOD (ROUTINE X 2)  SARS CORONAVIRUS 2 BY RT PCR  ETHANOL  CBC  RAPID URINE DRUG  SCREEN, HOSP PERFORMED  TROPONIN I (HIGH SENSITIVITY)  TROPONIN I (HIGH SENSITIVITY)    EKG EKG Interpretation Date/Time:  Thursday March 18 2023 10:29:18 EST Ventricular Rate:  73 PR Interval:  166 QRS Duration:  98 QT Interval:  384 QTC Calculation: 423 R Axis:   -23  Text Interpretation: Normal sinus rhythm Incomplete right bundle branch block Borderline ECG When compared with ECG of 16-Aug-2020 22:41, No significant change since last tracing Confirmed by Benjiman Core [phone]) on 03/18/2023 2:05:48 PM  Radiology DG Chest 2 View Result Date: 03/18/2023 CLINICAL DATA:  Sudden onset chest pain and shortness of breath. EXAM: CHEST - 2 VIEW COMPARISON:  CT chest dated December 24, 2022. Chest x-ray dated March 07, 2022. FINDINGS: Interval removal of the right chest wall catheter. The heart size and mediastinal contours are within normal limits. Normal pulmonary vascularity. No focal consolidation, pleural effusion, or pneumothorax. No acute osseous abnormality. IMPRESSION: No active cardiopulmonary disease. Electronically Signed   By: Obie Dredge M.D.   On: 03/18/2023 12:17    Procedures Procedures    Medications Ordered in ED Medications - No data to display  ED Course/ Medical Decision Making/ A&P                                 Medical Decision Making Amount and/or Complexity of Data Reviewed Labs: ordered. Radiology: ordered.   Patient with chest pain after using methamphetamine.  EKG reassuring.  X-ray reassuring.  Blood work overall reassuring.  Patient is medically cleared.  Discussed with psychiatry, who will see patient.  Does have suicidal thoughts.  States they are chronic but also states that roommate called because she was more worried.  Did have previous history of cancer.  Had some neuropathy due to it.  Lab work reassuring.  Blood cultures had been done due to hand pain and IV drug use.  Doubt severe endocarditis however.  Initial troponin  negative.  Patient is medically cleared.        Final Clinical Impression(s) / ED Diagnoses Final diagnoses:  Methamphetamine use (HCC)  Suicidal ideation  Nonspecific chest pain  Paresthesia    Rx / DC Orders ED Discharge Orders     None         Benjiman Core, MD 03/18/23 1426    Benjiman Core, MD 03/18/23 1426    Benjiman Core, MD 03/18/23 [phone]

## 2023-03-18 NOTE — ED Notes (Addendum)
Report receive from [place], [place]. From [facility].

## 2023-03-18 NOTE — ED Notes (Signed)
Pt given scrubs to change into. Once blood work is completed ill move pt to 2B

## 2023-03-18 NOTE — ED Provider Triage Note (Signed)
Emergency Medicine Provider Triage Evaluation Note  Jeremiah Allen , a 38 y.o. male  was evaluated in triage.  Pt complains of chest pain and feeling bad.  Recent meth use.  Review of Systems  Positive: Chest pain numbness on backs of hands. Negative: Fevers  Physical Exam  BP 131/70 (BP Location: Right Arm)   Pulse 73   Temp 98.3 F (36.8 C) (Oral)   Resp 16   Ht 5\' 9"  (1.753 m)   Wt 65.8 kg   SpO2 100%   BMI 21.41 kg/m  Paresthesias to dorsum of bilateral hands.  Somewhat anxious.  Medical Decision Making  Medically screening exam initiated at 10:40 AM.  Appropriate orders placed.  Jeremiah Allen was informed that the remainder of the evaluation will be completed by another provider, this initial triage assessment does not replace that evaluation, and the importance of remaining in the ED until their evaluation is complete.  Patient with chest pain and feeling bad after meth use.  Reportedly has been injecting meth recently.  States he been doing well for 7 months prior to this.   Benjiman Core, MD 03/18/23 1041

## 2023-03-18 NOTE — ED Provider Notes (Signed)
10:32 PM Was informed that patient is been excepted for inpatient management for his psychiatric problem.  Accepting physician at facility is Enedina Finner, MD.   EMTALA documentation will be filled out and patient will be transferred.  Clinical Impression: 1. Methamphetamine use (HCC)   2. Suicidal ideation   3. Nonspecific chest pain   4. Paresthesia     Disposition: Transfer to psychiatric facility for inpatient psychiatric management.  This note was prepared with assistance of Conservation officer, historic buildings. Occasional wrong-word or sound-a-like substitutions may have occurred due to the inherent limitations of voice recognition software.     Jeremiah Allen, Canary Brim, MD 03/18/23 2233

## 2023-03-19 ENCOUNTER — Other Ambulatory Visit (HOSPITAL_COMMUNITY)
Admission: EM | Admit: 2023-03-19 | Discharge: 2023-03-21 | Disposition: A | Discharge status: Home / Self Care | Payer: Medicaid Other | Attending: Psychiatry | Admitting: Psychiatry

## 2023-03-19 DIAGNOSIS — F919 Conduct disorder, unspecified: Secondary | ICD-10-CM | POA: Insufficient documentation

## 2023-03-19 DIAGNOSIS — F4325 Adjustment disorder with mixed disturbance of emotions and conduct: Secondary | ICD-10-CM | POA: Diagnosis present

## 2023-03-19 DIAGNOSIS — F4310 Post-traumatic stress disorder, unspecified: Secondary | ICD-10-CM | POA: Insufficient documentation

## 2023-03-19 DIAGNOSIS — Z9151 Personal history of suicidal behavior: Secondary | ICD-10-CM | POA: Insufficient documentation

## 2023-03-19 DIAGNOSIS — F1220 Cannabis dependence, uncomplicated: Secondary | ICD-10-CM | POA: Insufficient documentation

## 2023-03-19 DIAGNOSIS — F1520 Other stimulant dependence, uncomplicated: Secondary | ICD-10-CM | POA: Insufficient documentation

## 2023-03-19 DIAGNOSIS — F1910 Other psychoactive substance abuse, uncomplicated: Secondary | ICD-10-CM

## 2023-03-19 DIAGNOSIS — R45851 Suicidal ideations: Secondary | ICD-10-CM

## 2023-03-19 DIAGNOSIS — F69 Unspecified disorder of adult personality and behavior: Secondary | ICD-10-CM | POA: Diagnosis not present

## 2023-03-19 MED ORDER — LORAZEPAM 1 MG PO TABS
2.0000 mg | ORAL_TABLET | Freq: Four times a day (QID) | ORAL | Status: DC | PRN
  Administered 2023-03-19 – 2023-03-20 (×2): 2 mg via ORAL
  Filled 2023-03-19 (×2): qty 2

## 2023-03-19 MED ORDER — DIPHENHYDRAMINE HCL 50 MG/ML IJ SOLN: 50.0000 mg | Freq: Four times a day (QID) | INTRAMUSCULAR | Status: DC | PRN

## 2023-03-19 MED ORDER — GUANFACINE HCL 1 MG PO TABS: 1.0000 mg | ORAL_TABLET | Freq: Every day | ORAL | Status: DC

## 2023-03-19 MED ORDER — COLESEVELAM HCL 625 MG PO TABS: 1875.0000 mg | ORAL_TABLET | Freq: Two times a day (BID) | ORAL | Status: DC

## 2023-03-19 MED ORDER — HYDROXYZINE HCL 25 MG PO TABS
25.0000 mg | ORAL_TABLET | Freq: Three times a day (TID) | ORAL | Status: DC | PRN
  Filled 2023-03-19: qty 1

## 2023-03-19 MED ORDER — HYDROXYZINE HCL 25 MG PO TABS
25.0000 mg | ORAL_TABLET | Freq: Four times a day (QID) | ORAL | Status: DC | PRN
  Administered 2023-03-20: 25 mg via ORAL
  Filled 2023-03-19: qty 1

## 2023-03-19 MED ORDER — TRAZODONE HCL 100 MG PO TABS
100.0000 mg | ORAL_TABLET | Freq: Every evening | ORAL | Status: DC | PRN
  Administered 2023-03-19 – 2023-03-20 (×3): 100 mg via ORAL
  Filled 2023-03-19 (×3): qty 1

## 2023-03-19 MED ORDER — ALUM & MAG HYDROXIDE-SIMETH 200-200-20 MG/5ML PO SUSP: 30.0000 mL | ORAL | Status: DC | PRN

## 2023-03-19 MED ORDER — COLESTIPOL HCL 1 G PO TABS
1.0000 g | ORAL_TABLET | Freq: Three times a day (TID) | ORAL | Status: DC
  Administered 2023-03-19 – 2023-03-21 (×5): 1 g via ORAL
  Filled 2023-03-19 (×13): qty 1

## 2023-03-19 MED ORDER — ACETAMINOPHEN 325 MG PO TABS: 650.0000 mg | ORAL_TABLET | Freq: Four times a day (QID) | ORAL | Status: DC | PRN

## 2023-03-19 MED ORDER — HALOPERIDOL 5 MG PO TABS: 5.0000 mg | ORAL_TABLET | Freq: Four times a day (QID) | ORAL | Status: DC | PRN

## 2023-03-19 MED ORDER — MAGNESIUM HYDROXIDE 400 MG/5ML PO SUSP: 30.0000 mL | Freq: Every day | ORAL | Status: DC | PRN

## 2023-03-19 MED ORDER — CLONIDINE HCL 0.1 MG PO TABS
0.1000 mg | ORAL_TABLET | Freq: Four times a day (QID) | ORAL | Status: AC
  Administered 2023-03-19 – 2023-03-20 (×6): 0.1 mg via ORAL
  Filled 2023-03-19 (×8): qty 1

## 2023-03-19 MED ORDER — HYDROXYZINE HCL 25 MG PO TABS: 25.0000 mg | ORAL_TABLET | Freq: Three times a day (TID) | ORAL | Status: DC | PRN

## 2023-03-19 MED ORDER — METHOCARBAMOL 500 MG PO TABS: 500.0000 mg | ORAL_TABLET | Freq: Three times a day (TID) | ORAL | Status: DC | PRN

## 2023-03-19 MED ORDER — LORAZEPAM 2 MG/ML IJ SOLN: 2.0000 mg | Freq: Four times a day (QID) | INTRAMUSCULAR | Status: DC | PRN

## 2023-03-19 MED ORDER — GUANFACINE HCL 1 MG PO TABS
2.0000 mg | ORAL_TABLET | Freq: Every day | ORAL | Status: DC
  Administered 2023-03-19 – 2023-03-20 (×2): 2 mg via ORAL
  Filled 2023-03-19 (×2): qty 2

## 2023-03-19 MED ORDER — BUPROPION HCL ER (XL) 300 MG PO TB24
300.0000 mg | ORAL_TABLET | Freq: Every day | ORAL | Status: DC
Start: 2023-03-19 — End: 2023-03-21
  Administered 2023-03-19 – 2023-03-21 (×3): 300 mg via ORAL
  Filled 2023-03-19 (×3): qty 1

## 2023-03-19 MED ORDER — DIPHENHYDRAMINE HCL 50 MG PO CAPS: 50.0000 mg | ORAL_CAPSULE | Freq: Four times a day (QID) | ORAL | Status: DC | PRN

## 2023-03-19 MED ORDER — ONDANSETRON 4 MG PO TBDP: 4.0000 mg | ORAL_TABLET | Freq: Four times a day (QID) | ORAL | Status: DC | PRN

## 2023-03-19 MED ORDER — HALOPERIDOL LACTATE 5 MG/ML IJ SOLN: 5.0000 mg | Freq: Four times a day (QID) | INTRAMUSCULAR | Status: DC | PRN

## 2023-03-19 MED ORDER — CLONIDINE HCL 0.1 MG PO TABS
0.1000 mg | ORAL_TABLET | ORAL | Status: DC
  Administered 2023-03-21: 0.1 mg via ORAL
  Filled 2023-03-19: qty 1

## 2023-03-19 MED ORDER — DICYCLOMINE HCL 20 MG PO TABS: 20.0000 mg | ORAL_TABLET | Freq: Four times a day (QID) | ORAL | Status: DC | PRN

## 2023-03-19 MED ORDER — LOPERAMIDE HCL 2 MG PO CAPS: 2.0000 mg | ORAL_CAPSULE | ORAL | Status: DC | PRN

## 2023-03-19 MED ORDER — GUANFACINE HCL 1 MG PO TABS
1.0000 mg | ORAL_TABLET | Freq: Every day | ORAL | Status: DC
  Administered 2023-03-19 – 2023-03-20 (×2): 1 mg via ORAL
  Filled 2023-03-19 (×2): qty 1

## 2023-03-19 MED ORDER — NAPROXEN 500 MG PO TABS
500.0000 mg | ORAL_TABLET | Freq: Two times a day (BID) | ORAL | Status: DC | PRN
  Administered 2023-03-19: 500 mg via ORAL
  Filled 2023-03-19: qty 1

## 2023-03-19 MED ORDER — CLONIDINE HCL 0.1 MG PO TABS: 0.1000 mg | ORAL_TABLET | Freq: Every day | ORAL | Status: DC

## 2023-03-19 NOTE — ED Notes (Signed)
Pt in the hallway, stating he wants to leave and that he can not sleep here,  because of the snoring of other pts.

## 2023-03-19 NOTE — ED Notes (Signed)
Patient sitting in hallway, calm and compsosed. No acute distress noted. No concerns voiced. Informed patient to notify staff with any needs or assistance. Patient verbalized understanding or agreement. Safety checks in place per facility policy.

## 2023-03-19 NOTE — Tx Team (Signed)
LCSW met with patient to assess current mood, affect, physical state, and inquire about needs/goals while here in [facility] and after discharge. Patient reports he presented due to being completely overwhelmed and having an increase in anxiety. Patient reports he was being seen by his therapist and psychologist who reported that he "needed to stop smoking marijuana in order to have his ADHD testing completed". Patient reports stopping however reports it caused him extreme anxiety which ultimately led to him self-medicating with meth with an attempt to overdose. Patient reports being an empath and reports "I just did not want to feel anything anymore". Patient reports he used meth for the first time in 2022, and reports on and off use since then. Patient denied SI/HI/AVH at the time of assessment. Patient reports he is seen by a therapist at [facility] and is seen by Dr[facility] Alfonse Flavors at the [facility] for medication. Patient reports he is seen every 6 to 7 weeks and reports his next appointment is April 09, 2023 for both services.  Patient reports he is not working and is on disability.  Patient reports he lives at home with a roommate here in [place] and reports his home as a safe environment. Patient reports no concerns with returning back home at discharge, as he reports his goal is to get stabilized on his medication in order to control his anxiety and ADHD. Patient denies legal charges or upcoming court dates. Patient does report an interest in outpatient resources stating he has been in and out of NA/AA meetings since the age of 10 due to his family's involvement. Patient made aware that outpatient resources will available for him at discharge. Patient also aware that MD will speak with him shortly regarding medications. No other needs were reported at this time by patient.   LCSW will continue to follow and provide support to patient while on [facility] unit.   Fernande Boyden, LCSW Clinical Social Worker [facility] Ph: [phone]

## 2023-03-19 NOTE — Group Note (Signed)
Group Topic: Recovery Basics  Group Date: 03/19/2023 Start Time: 1000 End Time: 1040 Facilitators: Londell Moh, NT  Department: [facility]  Number of Participants: 10  Group Focus: acceptance, check in, and clarity of thought Treatment Modality:  Psychoeducation Interventions utilized were exploration and patient education Purpose: express feelings, increase insight, regain self-worth, and reinforce self-care  Name: Jeremiah Allen Date of Birth: 09-02-1985  MR: [id number]    Level of Participation: Pt did not attend group. 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

## 2023-03-19 NOTE — ED Notes (Signed)
Patient alert & oriented x4. Denies intent to harm self or others when asked. Denies A/VH. Patient reported pain in multiple sites, PRN Naproxen administered. Patient also reported anxiety and stated that Atarax doesn't help as "its just an allergy med ya know". PRN Ativan given. Patient later reported dizziness, manual BP obtained within normal range (102/60) - provider made aware. Patient has since been resting in bed with eyes closed and respirations even and unlabored. Patient has not eaten - states they would like to but due to cancer history foods "go right though" them, states they are worried about eating and having an accident, states they wear protective undergarments when at home. Writer encouraged patient to speak with a provider to come up with a plan so that patient may eat, patient voiced understanding. Provider made aware. No acute distress noted. Support and encouragement provided. Routine safety checks conducted per facility protocol. Encouraged patient to notify staff if any thoughts of harm towards self or others arise. Patient verbalizes understanding and agreement.

## 2023-03-19 NOTE — ED Notes (Signed)
PRN Naproxen and PRN Ativan given due to patient reported cramping pain in abdomen (over liver) rating 6/10 and patient reported anxiety rating 8/10. Medications administered with no complications. Environment secured, safety checks in place per facility policy.

## 2023-03-19 NOTE — Discharge Instructions (Signed)
[facility] [address], [place], [address] [phone] phone  New Patient Assessment/Therapy Walk-Ins:  Monday and Wednesday: 8 am until slots are full. Every 1st and 2nd Fridays of the month: 1 pm - 5 pm.  NO ASSESSMENT/THERAPY WALK-INS ON TUESDAYS OR THURSDAYS  New Patient Assessment/Medication Management Walk-Ins:  Monday - Friday:  8 am - 11 am.  For all walk-ins, we ask that you arrive by 7:30 am because patients will be seen in the order of arrival.  Availability is limited; therefore, you may not be seen on the same day that you walk-in.  Our goal is to serve and meet the needs of our community to the best of our [place] ability.  SUBSTANCE USE TREATMENT for Medicaid and State Funded/IPRS  [facility] [address], [place], [address] [phone] phone NOTE: [facility] is no longer offering IOP services.  Serves those who are low-income or have no insurance.  [facility] [address] [place], [place], [address] [phone] phone [phone] fax NOTE: Does have Substance Abuse-Intensive Outpatient Program [facility]) as well as transitional housing if eligible.  [facility] [address] [place], [place], [address] [phone] phone [phone] fax  [facility] [phone] [place], [place], [address] [phone] phone [phone] fax  HALFWAY HOUSES:  [facility] [phone]  [employer].oxfordvacancies.com  12 STEP PROGRAMS:  Alcoholics Anonymous of [place] SoftwareChalet.be  Narcotics Anonymous of [place] [employer]Protect.dk  Al-Anon of [place], [place] www.[place]alanon.org/find-meetings.html  Nar-Anon https://nar-anon.org/find-a-meetin  List of Residential placements:   [facility] in [place]: [phone]  [facility]: [phone]  [place], [place]  [phone]: Male and male facility; 30-day program: (uninsured and Medicaid such as Laurena Bering, [place], [place], partners)  [facility]: [phone]; men and women's facility; 28 days; Can have Medicaid tailored plan Tour manager or Partners)  [facility]: [phone] Karoline Caldwell or Larita Fife; 28 day program; must be fully detox; tailored Medicaid or no insurance  [address] in [place], [place]; [phone]; 28 day all males program; no insurance accepted  [facility] Referral in [place]: Gabriel Rung [phone] (no insurance or Medicaid only); 90 days; outpatient services but provide housing in apartments [place]  [facility] Admission: [phone]: Patient must complete phone screening for placement: [place], [place]; 6 month program; uninsured, Medicaid, and [employer].   [facility]: no insurance required; [phone]  [facility]: [phone]; Intake: Molly Maduro; Must fill out application online; Alecia Lemming Delay [phone] x [address] [facility] in [place], [place]: [phone]; Admissions Coordinators Mr. Maurine Minister or Barron Alvine; 90 day program.  [facility]: [place], [place] [phone]; Co-Ed 9 month to a year program; Online application; Men entry fee is $500 (6-53months);  [employer]: [address] [place], [place] [address]; no fee or insurance required; minimum of 2 years; Highly structured; work based; Intake Coordinator is Thayer Ohm [phone]  [facility] in [place], [place]: [phone]; Fax number is [phone]; website: www.[facility].org; Requires 3-6 page autobiography; 2 year program (18 months and then 25month transitional housing); Admission fee is $300; no insurance needed; work Automotive engineer in [place], [place]: [employer] Desk Staff: Danise Edge [phone]: They have a Men's Regenerations Program 6-39months. Free program; There is an initial $300 fee however, they are willing to work  with patients regarding that. Application is online.  [facility]: Admissions [phone] Doran Heater ext [phone]; Any 7-90 day program is out of pocket; 12  month program is free of charge; there is a $275 entry fee; Patient is responsible for own transportation

## 2023-03-19 NOTE — ED Provider Notes (Addendum)
Behavioral Health Progress Note  Date and Time: 03/19/2023 4:04 PM Name: Jeremiah Allen MRN:  [id number]  Subjective:  "I have not been using last weeks and that caused my anxiety to increase and I started using more methamphetamine."   HPI:  Jeremiah Allen, 38 y/o male with a history of substance abuse (methamphetamines), bipolar disorder, PTSD, ADHD and adjustment disorder.  Presented to [facility] as a transfer from the ED.  Patient was previously admitted to [facility] for substance abuse.  Patient originally came into the ED for suicide attempt.   Patient continues to endorse suicide attempt by methamphetamine overdose.  He states that typically he marijuana at least 3 times a day starting as early as possible in the morning due to waking up with anxiety 4-5 AM and having racing thoughts around 6 AM.  He states that the marijuana does decrease his anxiety and his racing thoughts.  Patient is prescribed Wellbutrin XL 300 mg which he states controls his anger. Patient follows with psychiatry at [facility] behavioral health and has therapy in place.  His next medical appointment is 04/09/2023.  Patient gives permission to contact his mental health providers regarding discharge planning.  Patient states he is a stage IV colorectal cancer survivor, currently in remission.  He feels frustrated that he is unable to have his usual home supplies for behavioral incontinence while he is admitted.  He denies any other somatic complaints.  He endorses poor sleep and notes that he is leaves no more than a day.  He admits that this is not consolidated sleep.  Regularly feels like he has increased energy and racing thoughts.  He believes the symptoms are related to his new diagnosis of ADHD.  Patient endorses using amphetamines since 2022.  He endorses typically using methamphetamine Monday, Tuesday, and Wednesday even while prescribed psychotropic medications.   He states that he "overdosed on the  methamphetamine as a suicide attempt after not getting his job".   Face-to-face evaluation of patient, patient is alert and oriented x 4, speech is clear, maintain eye contact.  Patient answer questions appropriately.  Patient reported that when he came into the hospital he suicidal but stated he is not suicidal anymore.   Initially he reported that he used methamphetamines on a regular and now wanted rehab.  He, however declines CD IOP, noting he does not do well with group therapy since being in AA/NA since 38 years old with his parents.   He does not want substance use residential treatment. He is hopeful to continue in treatment with his outpatient psychiatrist and therapist.  Patient denies HI, AVH or paranoia.  Denied current alcohol use.  At this time patient does not seem to be a risk to himself or others does not show any sign of distress. Patient home medication were restarted.    Diagnosis:  Final diagnoses:  Substance abuse (HCC)  Suicidal ideation  Behavior concern in adult   Total Time Spent in Direct Patient Care:  I personally spent 80 minutes on the unit in direct patient care. The direct patient care time included face-to-face time with the patient, reviewing the patient's chart, communicating with other professionals, and coordinating care. Greater than 50% of this time was spent in counseling or coordinating care with the patient regarding goals of hospitalization, psycho-education, and discharge planning needs.   Past Psychiatric History:  substance abuse (methamphetamines), bipolar disorder, PTSD, ADHD and adjustment disorder.    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  Lives with a roommate who he says is supportive                    Sleep: Poor patient states that he either does not sleep or sleeps through the day.    Appetite:  Fair  Current Medications:  Current Facility-Administered Medications  Medication Dose Route Frequency Provider Last Rate Last Admin   acetaminophen (TYLENOL) tablet 650 mg  650 mg Oral Q6H PRN Motley-Mangrum, Jadeka A, PMHNP       alum & mag hydroxide-simeth (MAALOX/MYLANTA) 200-200-20 MG/5ML suspension 30 mL  30 mL Oral Q4H PRN Motley-Mangrum, Jadeka A, PMHNP       buPROPion (WELLBUTRIN XL) 24 hr tablet 300 mg  300 mg Oral Daily Motley-Mangrum, Jadeka A, PMHNP   300 mg at 03/19/23 0950   cloNIDine (CATAPRES) tablet 0.1 mg  0.1 mg Oral QID Sindy Guadeloupe, NP   0.1 mg at 03/19/23 1409   Followed by   Melene Muller ON 03/21/2023] cloNIDine (CATAPRES) tablet 0.1 mg  0.1 mg Oral Elgie Collard, NP       Followed by   Melene Muller ON 03/23/2023] cloNIDine (CATAPRES) tablet 0.1 mg  0.1 mg Oral QAC breakfast Sindy Guadeloupe, NP       dicyclomine (BENTYL) tablet 20 mg  20 mg Oral Q6H PRN Sindy Guadeloupe, NP       diphenhydrAMINE (BENADRYL) capsule 50 mg  50 mg Oral Q6H PRN Lenox Ponds, NP       Or   diphenhydrAMINE (BENADRYL) injection 50 mg  50 mg Intramuscular Q6H PRN Lenox Ponds, NP       guanFACINE (TENEX) tablet 1 mg  1 mg Oral Q breakfast Mariel Craft, MD       guanFACINE Colin Rhein) tablet 2 mg  2 mg Oral QHS Mariel Craft, MD       haloperidol (HALDOL) tablet 5 mg  5 mg Oral Q6H PRN Lenox Ponds, NP       Or   haloperidol lactate (HALDOL) injection 5 mg  5 mg Intramuscular Q6H PRN Lenox Ponds, NP       hydrOXYzine (ATARAX) tablet 25 mg  25  mg Oral Q6H PRN Sindy Guadeloupe, NP       Melene Muller ON 03/24/2023] hydrOXYzine (ATARAX) tablet 25 mg  25 mg Oral TID PRN Rex Kras, MD       loperamide (IMODIUM) capsule 2-4 mg  2-4 mg Oral PRN Sindy Guadeloupe, NP       LORazepam (ATIVAN) tablet 2 mg  2 mg Oral Q6H PRN Lenox Ponds, NP   2 mg at 03/19/23 1610   Or   LORazepam (ATIVAN) injection 2 mg  2 mg Intramuscular Q6H PRN Lenox Ponds, NP  magnesium hydroxide (MILK OF MAGNESIA) suspension 30 mL  30 mL Oral Daily PRN Motley-Mangrum, Geralynn Ochs A, PMHNP       methocarbamol (ROBAXIN) tablet 500 mg  500 mg Oral Q8H PRN Sindy Guadeloupe, NP       naproxen (NAPROSYN) tablet 500 mg  500 mg Oral BID PRN Sindy Guadeloupe, NP   500 mg at 03/19/23 0950   ondansetron (ZOFRAN-ODT) disintegrating tablet 4 mg  4 mg Oral Q6H PRN Sindy Guadeloupe, NP       traZODone (DESYREL) tablet 100 mg  100 mg Oral QHS PRN Motley-Mangrum, Ezra Sites, PMHNP   100 mg at 03/19/23 0153   Current Outpatient Medications  Medication Sig Dispense Refill   acetaminophen (TYLENOL) 500 MG tablet Take 500 mg by mouth 2 (two) times daily as needed for moderate pain (pain score 4-6), headache or fever.     buPROPion (WELLBUTRIN XL) 150 MG 24 hr tablet Take 300 mg by mouth daily.     buPROPion (WELLBUTRIN XL) 300 MG 24 hr tablet Take 1 tablet (300 mg total) by mouth every morning. (Patient not taking: Reported on 03/18/2023) 30 tablet 1   colesevelam (WELCHOL) 625 MG tablet Take 3 tablets (1,875 mg total) by mouth 2 (two) times daily. Take with meals. (Patient not taking: Reported on 03/18/2023) 180 tablet 2   colestipol (COLESTID) 1 g tablet Take 1 tablet (1 g total) by mouth 3 (three) times daily as needed (diarrhea/fecal urgency). Do not take within 2 hours of other medications. 90 tablet 3    Labs  Lab Results:  Admission on 03/18/2023, Discharged on 03/18/2023  Component Date Value Ref Range Status   Sodium 03/18/2023 136  135 - 145 mmol/L Final   Potassium 03/18/2023 3.4 (L)   3.5 - 5.1 mmol/L Final   Chloride 03/18/2023 103  98 - 111 mmol/L Final   CO2 03/18/2023 21 (L)  22 - 32 mmol/L Final   Glucose, Bld 03/18/2023 103 (H)  70 - 99 mg/dL Final   Glucose reference range applies only to samples taken after fasting for at least 8 hours.   BUN 03/18/2023 15  6 - 20 mg/dL Final   Creatinine, Ser 03/18/2023 1.04  0.61 - 1.24 mg/dL Final   Calcium 09/81/1914 9.4  8.9 - 10.3 mg/dL Final   Total Protein 78/29/5621 7.5  6.5 - 8.1 g/dL Final   Albumin 30/86/5784 4.4  3.5 - 5.0 g/dL Final   AST 69/62/9528 29  15 - 41 U/L Final   ALT 03/18/2023 23  0 - 44 U/L Final   Alkaline Phosphatase 03/18/2023 66  38 - 126 U/L Final   Total Bilirubin 03/18/2023 0.9  0.0 - 1.2 mg/dL Final   GFR, Estimated 03/18/2023 >60  >60 mL/min Final   Comment: (NOTE) Calculated using the CKD-EPI Creatinine Equation (2021)    Anion gap 03/18/2023 12  5 - 15 Final   Performed at [facility], [address], [place], [place] [address]   Alcohol, Ethyl (B) 03/18/2023 <10  <10 mg/dL Final   Comment: (NOTE) Lowest detectable limit for serum alcohol is 10 mg/dL.  For medical purposes only. Performed at [facility], [address], [place], [place] [address]    Salicylate Lvl 03/18/2023 <7.0 (L)  7.0 - 30.0 mg/dL Final   Performed at [facility], [address] [place], [place] [address]   Acetaminophen (Tylenol), Serum 03/18/2023 <10 (L)  10 - 30 ug/mL Final   Comment: (NOTE)  Therapeutic concentrations vary significantly. A range of 10-30 ug/mL  may be an effective concentration for many patients. However, some  are best treated at concentrations outside of this range. Acetaminophen concentrations >150 ug/mL at 4 hours after ingestion  and >50 ug/mL at 12 hours after ingestion are often associated with  toxic reactions.  Performed at [facility], [address], [place], [place] [address]    WBC 03/18/2023 5.8  4.0 - 10.5 K/uL Final   RBC 03/18/2023 4.34   4.22 - 5.81 MIL/uL Final   Hemoglobin 03/18/2023 13.6  13.0 - 17.0 g/dL Final   HCT 19/14/7829 40.4  39.0 - 52.0 % Final   MCV 03/18/2023 93.1  80.0 - 100.0 fL Final   MCH 03/18/2023 31.3  26.0 - 34.0 pg Final   MCHC 03/18/2023 33.7  30.0 - 36.0 g/dL Final   RDW 56/21/3086 12.6  11.5 - 15.5 % Final   Platelets 03/18/2023 219  150 - 400 K/uL Final   nRBC 03/18/2023 0.0  0.0 - 0.2 % Final   Performed at [facility] Lab, [address] [place], [place] [address]   Opiates 03/18/2023 NONE DETECTED  NONE DETECTED Final   Cocaine 03/18/2023 NONE DETECTED  NONE DETECTED Final   Benzodiazepines 03/18/2023 NONE DETECTED  NONE DETECTED Final   Amphetamines 03/18/2023 POSITIVE (A)  NONE DETECTED Final   Tetrahydrocannabinol 03/18/2023 POSITIVE (A)  NONE DETECTED Final   Barbiturates 03/18/2023 NONE DETECTED  NONE DETECTED Final   Comment: (NOTE) DRUG SCREEN FOR MEDICAL PURPOSES ONLY.  IF CONFIRMATION IS NEEDED FOR ANY PURPOSE, NOTIFY LAB WITHIN 5 DAYS.  LOWEST DETECTABLE LIMITS FOR URINE DRUG SCREEN Drug Class                     Cutoff (ng/mL) Amphetamine and metabolites    1000 Barbiturate and metabolites    200 Benzodiazepine                 200 Opiates and metabolites        300 Cocaine and metabolites        300 THC                            50 Performed at [facility], [address], [place], [place] [address]    Troponin I (High Sensitivity) 03/18/2023 3  <18 ng/L Final   Comment: (NOTE) Elevated high sensitivity troponin I (hsTnI) values and significant  changes across serial measurements may suggest ACS but many other  chronic and acute conditions are known to elevate hsTnI results.  Refer to the "Links" section for chest pain algorithms and additional  guidance. Performed at [facility], [address], [place], [place] [address]    Specimen Description 03/18/2023 BLOOD RIGHT ANTECUBITAL   Final   Special Requests 03/18/2023 BOTTLES DRAWN AEROBIC AND  ANAEROBIC Blood Culture adequate volume   Final   Culture 03/18/2023    Final                   Value:NO GROWTH < 24 HOURS Performed at [facility], [address], [place], [place] [address]    Report Status 03/18/2023 PENDING   Incomplete   Specimen Description 03/18/2023 BLOOD LEFT ANTECUBITAL   Final   Special Requests 03/18/2023 BOTTLES DRAWN AEROBIC ONLY Blood Culture adequate volume   Final   Culture 03/18/2023  Final                   Value:NO GROWTH < 24 HOURS Performed at [facility], [address], [place], [place] [address]    Report Status 03/18/2023 PENDING   Incomplete   Troponin I (High Sensitivity) 03/18/2023 3  <18 ng/L Final   Comment: (NOTE) Elevated high sensitivity troponin I (hsTnI) values and significant  changes across serial measurements may suggest ACS but many other  chronic and acute conditions are known to elevate hsTnI results.  Refer to the "Links" section for chest pain algorithms and additional  guidance. Performed at [facility], [address] [place], [place] [address]    SARS Coronavirus 2 by RT PCR 03/18/2023 NEGATIVE  NEGATIVE Final   Performed at [facility], [address] [place], [place] [address]  Appointment on 12/24/2022  Component Date Value Ref Range Status   Sodium 12/24/2022 136  135 - 145 mmol/L Final   Potassium 12/24/2022 3.7  3.5 - 5.1 mmol/L Final   Chloride 12/24/2022 100  98 - 111 mmol/L Final   CO2 12/24/2022 27  22 - 32 mmol/L Final   Glucose, Bld 12/24/2022 97  70 - 99 mg/dL Final   Glucose reference range applies only to samples taken after fasting for at least 8 hours.   BUN 12/24/2022 9  6 - 20 mg/dL Final   Creatinine 56/21/3086 0.83  0.61 - 1.24 mg/dL Final   Calcium 57/84/6962 9.0  8.9 - 10.3 mg/dL Final   Total Protein 95/28/4132 7.3  6.5 - 8.1 g/dL Final   Albumin 44/02/270 4.7  3.5 - 5.0 g/dL Final   AST 53/66/4403 14 (L)  15 - 41 U/L Final   ALT 12/24/2022 7  0 - 44 U/L  Final   Alkaline Phosphatase 12/24/2022 58  38 - 126 U/L Final   Total Bilirubin 12/24/2022 0.5  <1.2 mg/dL Final   GFR, Estimated 12/24/2022 >60  >60 mL/min Final   Comment: (NOTE) Calculated using the CKD-EPI Creatinine Equation (2021)    Anion gap 12/24/2022 9  5 - 15 Final   Performed at [facility], [address], [place], [place] [address]   WBC Count 12/24/2022 3.4 (L)  4.0 - 10.5 K/uL Final   RBC 12/24/2022 4.17 (L)  4.22 - 5.81 MIL/uL Final   Hemoglobin 12/24/2022 12.8 (L)  13.0 - 17.0 g/dL Final   HCT 95/63/8756 38.3 (L)  39.0 - 52.0 % Final   MCV 12/24/2022 91.8  80.0 - 100.0 fL Final   MCH 12/24/2022 30.7  26.0 - 34.0 pg Final   MCHC 12/24/2022 33.4  30.0 - 36.0 g/dL Final   RDW 43/32/9518 12.4  11.5 - 15.5 % Final   Platelet Count 12/24/2022 197  150 - 400 K/uL Final   nRBC 12/24/2022 0.0  0.0 - 0.2 % Final   Neutrophils Relative % 12/24/2022 45  % Final   Neutro Abs 12/24/2022 1.5 (L)  1.7 - 7.7 K/uL Final   Lymphocytes Relative 12/24/2022 41  % Final   Lymphs Abs 12/24/2022 1.4  0.7 - 4.0 K/uL Final   Monocytes Relative 12/24/2022 10  % Final   Monocytes Absolute 12/24/2022 0.3  0.1 - 1.0 K/uL Final   Eosinophils Relative 12/24/2022 3  % Final   Eosinophils Absolute 12/24/2022 0.1  0.0 - 0.5 K/uL Final   Basophils Relative 12/24/2022 1  % Final   Basophils Absolute 12/24/2022 0.0  0.0 - 0.1 K/uL Final   Immature Granulocytes 12/24/2022 0  % Final   Abs Immature Granulocytes 12/24/2022 0.01  0.00 - 0.07 K/uL Final   Performed at [facility], [address] [place], [place] [address]   CEA [facility]) 12/24/2022 2.51  0.00 - 5.00 ng/mL Final   Comment: (NOTE) This test was performed using Beckman Coulter's paramagnetic chemiluminescent immunoassay. Values obtained from different assay methods cannot be used interchangeably. Please note that up to 8% of patients who smoke may see values 5.1-10.0 ng/ml and 1% of patients who  smoke may see CEA levels >10.0 ng/ml. Performed at [facility], [address], [place], [place] [address]   Appointment on 09/24/2022  Component Date Value Ref Range Status   CEA ([facility]) 09/24/2022 2.38  0.00 - 5.00 ng/mL Final   Comment: (NOTE) This test was performed using Beckman Coulter's paramagnetic chemiluminescent immunoassay. Values obtained from different assay methods cannot be used interchangeably. Please note that up to 8% of patients who smoke may see values 5.1-10.0 ng/ml and 1% of patients who smoke may see CEA levels >10.0 ng/ml. Performed at [facility], [address], [place], [place] [address]     Blood Alcohol level:  Lab Results  Component Value Date   [facility] <10 03/18/2023    Metabolic Disorder Labs: No results found for: "HGBA1C", "MPG" No results found for: "PROLACTIN" No results found for: "CHOL", "TRIG", "HDL", "CHOLHDL", "VLDL", "LDLCALC"  Therapeutic Lab Levels: No results found for: "LITHIUM" No results found for: "VALPROATE" No results found for: "CBMZ"  Physical Findings   GAD-7    Flowsheet Row Counselor from 10/29/2022 in [facility]  Total GAD-7 Score 20      PHQ2-9    Flowsheet Row ED from 03/19/2023 in [facility] Counselor from 10/29/2022 in [facility] Social Work from 04/29/2021 in [facility]  PHQ-2 Total Score 4 4 2   PHQ-9 Total Score 9 21 6       Flowsheet Row ED from 03/19/2023 in [facility] ED from 03/18/2023 in [facility] Emergency Department at [facility] Counselor from 10/29/2022 in [facility]  C-SSRS RISK CATEGORY High Risk Error: Q2 is Yes, you must answer 3, 4, and 5 Low Risk        Musculoskeletal  Strength & Muscle Tone: within normal limits Gait & Station:  Feels  dizzy from medication Patient leans: N/A  Psychiatric Specialty Exam  Presentation  General Appearance:  Casual  Eye Contact: Good  Speech: Clear and Coherent  Speech Volume: Normal  Handedness: Right   Mood and Affect  Mood: Anxious  Affect: Appropriate   Thought Process  Thought Processes: Coherent  Descriptions of Associations:Intact  Orientation:Full (Time, Place and Person)  Thought Content:Logical  Diagnosis of Schizophrenia or Schizoaffective disorder in past: No    Hallucinations:Hallucinations: None  Ideas of Reference:None  Suicidal Thoughts:Suicidal Thoughts: No  Homicidal Thoughts:Homicidal Thoughts: No   Sensorium  Memory: Immediate Fair  Judgment: Fair  Insight: Fair   Art therapist  Concentration: Fair  Attention Span: Good  Recall: Good  Fund of Knowledge: Good  Language: Good   Psychomotor Activity  Psychomotor Activity: Psychomotor Activity: Normal   Assets  Assets: Desire for Improvement; Social Support; Resilience   Sleep  Sleep: Sleep: Fair Number of Hours of Sleep: 6   Nutritional Assessment (  For OBS and [facility] admissions only) Has the patient had a weight loss or gain of 10 pounds or more in the last 3 months?: No Has the patient had a decrease in food intake/or appetite?: No Does the patient have dental problems?: No Does the patient have eating habits or behaviors that may be indicators of an eating disorder including binging or inducing vomiting?: No Has the patient recently lost weight without trying?: 0 Has the patient been eating poorly because of a decreased appetite?: 0 Malnutrition Screening Tool Score: 0    Physical Exam  Physical Exam Constitutional:      Appearance: Normal appearance. He is normal weight.  HENT:     Head: Normocephalic and atraumatic.     Nose: Nose normal.  Eyes:     Conjunctiva/sclera: Conjunctivae normal.  Cardiovascular:     Rate and Rhythm:  Normal rate and regular rhythm.  Pulmonary:     Effort: Pulmonary effort is normal. No respiratory distress.  Musculoskeletal:        General: Normal range of motion.     Cervical back: Normal range of motion.  Neurological:     General: No focal deficit present.     Mental Status: He is alert and oriented to person, place, and time.     Cranial Nerves: No cranial nerve deficit.  Psychiatric:        Mood and Affect: Mood normal.    Review of Systems  Constitutional:  Positive for malaise/fatigue. Negative for chills and fever.  Respiratory:  Negative for shortness of breath.   Cardiovascular:  Negative for chest pain and palpitations.  Gastrointestinal:        Loose stools r/t Colon CA treatment  Musculoskeletal:  Negative for myalgias.  Neurological:  Positive for dizziness. Negative for weakness and headaches.  Psychiatric/Behavioral:  Positive for substance abuse. Negative for depression, hallucinations and suicidal ideas. The patient is nervous/anxious and has insomnia.    Blood pressure 98/60, pulse 77, temperature 98 F (36.7 C), temperature source Oral, resp. rate 18, SpO2 100%. There is no height or weight on file to calculate BMI.  Treatment Plan Summary: Daily contact with patient to assess and evaluate symptoms and progress in treatment and Medication management    ## Psychiatric Medication Recommendations:  -Start COWS protocol -Continue Wellbutrin 300 mg p.o. daily -Trazodone 100 mg p.o. nightly as needed -Start guanfacine 1 mg in the morning and 2 mg at bedtime for ADHD, noted side effect of sedation for sleep onset.  Reviewed with patient need to commit to at least 6 hours of consolidated sleep a day.  He will choose to sleep between 2 AM and 8 AM.  He is aware that he will need to be in his room earlier while admitted to [facility]  -Agitation protocol medications and sleep aids in place.   ## Medical Decision Making Capacity: Not specifically addressed in this  encounter   ## Disposition:--Will attempt to make sooner appointment with outpatient psychiatrist and therapist as patient declines substance use rehabilitation at this time.   ## Behavioral / Environmental: -Safety/agitation precautions  Mariel Craft, MD 03/19/2023 4:04 PM

## 2023-03-19 NOTE — ED Notes (Signed)
Patient reported dizziness. Manual BP obtained of 102/60. Patient stated they wanted to lay down. Writer encouraged patient to let staff know if they need anything or the dizziness worsened. Patient stable and ambulatory. Environment secured, safety checks in place per facility policy.

## 2023-03-19 NOTE — [facility] Group Notes (Signed)
SPIRITUALITY GROUP NOTE  Spirituality group facilitated by Wilkie Aye, MDiv, BCC.  Group Description: Group focused on topic of hope. Patients participated in facilitated discussion around topic, connecting with one another around experiences and definitions for hope. Group members engaged with visual explorer photos, reflecting on what hope looks like for them today. Group engaged in discussion around how their definitions of hope are present today in hospital.  Modalities: Psycho-social ed, Adlerian, Narrative, MI  Patient Progress: DID NOT ATTEND

## 2023-03-19 NOTE — ED Notes (Signed)
Patient resting with eyes closed in no apparent acute distress. Respirations even and unlabored. Environment secured. Safety checks in place according to facility policy.

## 2023-03-19 NOTE — ED Notes (Signed)
Pt transferred from [facility] after being medically cleared from overdosing on Meth,  During the admission process, pt states "I don't know why I am brought here, no one tells him anything". Writer explained to pt the service we provide here at [facility].  Pt say , when he was well, no one cared about him until he was sick, that was when people were all over him showing him love,now that  he is feeling better, and, no one care about him again and that, he is, better off death. Pt said he is going to be messing up our bed sheet because he sometimes have involuntary BM. Writer got pt brief for pt to wear and incontinence pad  on pt bed to help prevent pt being soiled when accident happens, but pt refused the brief. Pt  Admission process completed. Pt is oriented to the unit and provided with meal, but refused and said, he is not going to eat here until he leaves . Pt denies HI/AVH. Will continue to monitor for safety and provide support.

## 2023-03-19 NOTE — ED Provider Notes (Signed)
 Facility Based Crisis Admission H&P  Date: 03/19/23 Patient Name: Jeremiah Allen MRN: [id number] Chief Complaint: suicidal attemp  Diagnoses:  Final diagnoses:  Substance abuse [facility])  Suicidal ideation  Behavior concern in adult    HPI: Jeremiah Allen, 38 y/o male with a history of substance abuse (methamphetamines), bipolar disorder, PTSD, ADHD and adjustment disorder.  Presented to [facility] as a transfer from the ED.  Patient was previously admitted to [facility] for substance abuse.  Patient originally came into the ED for suicide attempt.   Face-to-face evaluation of patient, patient is alert and oriented x 4, speech is clear, maintain eye contact.  Patient answer questions appropriately.  Patient reported that when he came into the hospital he suicidal but stated he is not suicidal anymore, reported that he used methamphetamines on a regular and now wanted rehab.  Patient denies HI, AVH or paranoia.  Denied current alcohol use.  At this time patient does not seem to be a risk to himself or others does not show any sign of distress. Patient home medication be restarted. Patient will be admitted to [facility]   Please see ED note  PHQ 2-9:  Flowsheet Row ED from 03/19/2023 in [facility] Counsel[facility] from 10/29/2022 in [facility] Work from 04/29/2021 in [facility] [place]  Thoughts that you would be better off dead, or of hurting yourself in some way Several days Not at all Not at all  PHQ-9 Total Score 9 21 6        Flowsheet Row ED from 03/19/2023 in [facility] from 03/18/2023 in [facility] Emergency Department at [facility] Counselor from 10/29/2022 in [facility]  C-SSRS RISK CATEGORY High Risk Error: Q2 is Yes, you must answer 3, 4, and 5 Low Risk         Total Time spent with patient: 20  minutes  Musculoskeletal  Strength & Muscle Tone: within normal limits Gait & Station: normal Patient leans: N/A  Psychiatric Specialty Exam  Presentation General Appearance:  Casual  Eye Contact: Good  Speech: Clear and Coherent  Speech Volume: Normal  Handedness: Right   Mood and Affect  Mood: Anxious  Affect: Appropriate   Thought Process  Thought Processes: Coherent  Descriptions of Associations:Intact  Orientation:Full (Time, Place and Person)  Thought Content:Logical  Diagnosis of Schizophrenia or Schizoaffective disorder in past: No   Hallucinations:Hallucinations: None  Ideas of Reference:None  Suicidal Thoughts:Suicidal Thoughts: No  Homicidal Thoughts:Homicidal Thoughts: No   Sensorium  Memory: Immediate Fair  Judgment: Fair  Insight: Fair   Art therapist  Concentration: Fair  Attention Span: Good  Recall: Good  Fund of Knowledge: Good  Language: Good   Psychomotor Activity  Psychomotor Activity: Psychomotor Activity: Normal   Assets  Assets: Desire for Improvement; Social Support; Resilience   Sleep  Sleep: Sleep: Fair Number of Hours of Sleep: 6   Nutritional Assessment (For OBS and FBC admissions only) Has the patient had a weight loss or gain of 10 pounds or more in the last 3 months?: No Has the patient had a decrease in food intake/or appetite?: No Does the patient have dental problems?: No Does the patient have eating habits or behaviors that may be indicators of an eating disorder including binging or inducing vomiting?: No Has the patient recently lost weight without trying?: 0 Has the patient been eating poorly because  of a decreased appetite?: 0 Malnutrition Screening Tool Score: 0    Physical Exam HENT:     Head: Normocephalic.     Nose: Nose normal.  Eyes:     Pupils: Pupils are equal, round, and reactive to light.  Cardiovascular:     Rate and Rhythm: Normal rate.   Pulmonary:     Effort: Pulmonary effort is normal.  Musculoskeletal:        General: Normal range of motion.  Neurological:     General: No focal deficit present.     Mental Status: He is alert.  Psychiatric:        Mood and Affect: Mood normal.        Behavior: Behavior normal.        Thought Content: Thought content normal.        Judgment: Judgment normal.    Review of Systems  Constitutional: Negative.   HENT: Negative.    Eyes: Negative.   Respiratory: Negative.    Cardiovascular: Negative.   Gastrointestinal: Negative.   Genitourinary: Negative.   Musculoskeletal: Negative.   Skin: Negative.   Neurological: Negative.   Psychiatric/Behavioral:  Positive for depression, substance abuse and suicidal ideas. The patient is nervous/anxious.     Blood pressure 127/79, pulse 77, temperature 98 F (36.7 C), temperature source Oral, resp. rate 18, SpO2 100%. There is no height or weight on file to calculate BMI.  Past Psychiatric History: Substance abuse, ADHD, PTSD bipolar disorder with depression  Is the patient at risk to self? No  Has the patient been a risk to self in the past 6 months? Yes .    Has the patient been a risk to self within the distant past? Yes   Is the patient a risk to others? No   Has the patient been a risk to others in the past 6 months? No   Has the patient been a risk to others within the distant past? No   Past Medical History: see chart  Family History: unknown  Social History: Substance abuse  Last Labs:  Admission on 03/18/2023, Discharged on 03/18/2023  Component Date Value Ref Range Status   Sodium 03/18/2023 136  135 - 145 mmol/L Final   Potassium 03/18/2023 3.4 (L)  3.5 - 5.1 mmol/L Final   Chloride 03/18/2023 103  98 - 111 mmol/L Final   CO2 03/18/2023 21 (L)  22 - 32 mmol/L Final   Glucose, Bld 03/18/2023 103 (H)  70 - 99 mg/dL Final   Glucose reference range applies only to samples taken after fasting for at least 8 hours.   BUN  03/18/2023 15  6 - 20 mg/dL Final   Creatinine, Ser 03/18/2023 1.04  0.61 - 1.24 mg/dL Final   Calcium 16/11/9602 9.4  8.9 - 10.3 mg/dL Final   Total Protein 54/10/8117 7.5  6.5 - 8.1 g/dL Final   Albumin 14/78/2956 4.4  3.5 - 5.0 g/dL Final   AST 21/30/8657 29  15 - 41 U/L Final   ALT 03/18/2023 23  0 - 44 U/L Final   Alkaline Phosphatase 03/18/2023 66  38 - 126 U/L Final   Total Bilirubin 03/18/2023 0.9  0.0 - 1.2 mg/dL Final   GFR, Estimated 03/18/2023 >60  >60 mL/min Final   Comment: (NOTE) Calculated using the CKD-EPI Creatinine Equation (2021)    Anion gap 03/18/2023 12  5 - 15 Final   Performed at [facility], [address] [place], [place] [address]  Alcohol, Ethyl (B) 03/18/2023 <10  <10 mg/dL Final   Comment: (NOTE) Lowest detectable limit for serum alcohol is 10 mg/dL.  For medical purposes only. Performed at [facility], [address], [place], [place] [address]    Salicylate Lvl 03/18/2023 <7.0 (L)  7.0 - 30.0 mg/dL Final   Performed at [facility], [address] [place], [place] [address]   Acetaminophen (Tylenol), Serum 03/18/2023 <10 (L)  10 - 30 ug/mL Final   Comment: (NOTE) Therapeutic concentrations vary significantly. A range of 10-30 ug/mL  may be an effective concentration for many patients. However, some  are best treated at concentrations outside of this range. Acetaminophen concentrations >150 ug/mL at 4 hours after ingestion  and >50 ug/mL at 12 hours after ingestion are often associated with  toxic reactions.  Performed at [facility], [address], [place], [place] [address]    WBC 03/18/2023 5.8  4.0 - 10.5 K/uL Final   RBC 03/18/2023 4.34  4.22 - 5.81 MIL/uL Final   Hemoglobin 03/18/2023 13.6  13.0 - 17.0 g/dL Final   HCT 91/47/8295 40.4  39.0 - 52.0 % Final   MCV 03/18/2023 93.1  80.0 - 100.0 fL Final   MCH 03/18/2023 31.3  26.0 - 34.0 pg Final   MCHC 03/18/2023 33.7  30.0 - 36.0 g/dL Final   RDW 62/13/0865  12.6  11.5 - 15.5 % Final   Platelets 03/18/2023 219  150 - 400 K/uL Final   nRBC 03/18/2023 0.0  0.0 - 0.2 % Final   Performed at [facility], [address], [place], [place] [address]   Opiates 03/18/2023 NONE DETECTED  NONE DETECTED Final   Cocaine 03/18/2023 NONE DETECTED  NONE DETECTED Final   Benzodiazepines 03/18/2023 NONE DETECTED  NONE DETECTED Final   Amphetamines 03/18/2023 POSITIVE (A)  NONE DETECTED Final   Tetrahydrocannabinol 03/18/2023 POSITIVE (A)  NONE DETECTED Final   Barbiturates 03/18/2023 NONE DETECTED  NONE DETECTED Final   Comment: (NOTE) DRUG SCREEN FOR MEDICAL PURPOSES ONLY.  IF CONFIRMATION IS NEEDED FOR ANY PURPOSE, NOTIFY LAB WITHIN 5 DAYS.  LOWEST DETECTABLE LIMITS FOR URINE DRUG SCREEN Drug Class                     Cutoff (ng/mL) Amphetamine and metabolites    1000 Barbiturate and metabolites    200 Benzodiazepine                 200 Opiates and metabolites        300 Cocaine and metabolites        300 THC                            50 Performed at [facility], [address], [place], [place] [address]    Troponin I (High Sensitivity) 03/18/2023 3  <18 ng/L Final   Comment: (NOTE) Elevated high sensitivity troponin I (hsTnI) values and significant  changes across serial measurements may suggest ACS but many other  chronic and acute conditions are known to elevate hsTnI results.  Refer to the "Links" section for chest pain algorithms and additional  guidance. Performed at [facility], [address], [place], [place] [address]    Troponin I (High Sensitivity) 03/18/2023 3  <18 ng/L Final   Comment: (NOTE) Elevated high sensitivity troponin I (hsTnI) values and significant  changes across serial measurements  may suggest ACS but many other  chronic and acute conditions are known to elevate hsTnI results.  Refer to the "Links" section for chest pain algorithms and additional  guidance. Performed at [facility]  [facility], [address], [address] [place], [place] [address]    SARS Coronavirus 2 by RT PCR 03/18/2023 NEGATIVE  NEGATIVE Final   Performed at [facility], [address], [place], [place] [address]  Appointment on 12/24/2022  Component Date Value Ref Range Status   Sodium 12/24/2022 136  135 - 145 mmol/L Final   Potassium 12/24/2022 3.7  3.5 - 5.1 mmol/L Final   Chloride 12/24/2022 100  98 - 111 mmol/L Final   CO2 12/24/2022 27  22 - 32 mmol/L Final   Glucose, Bld 12/24/2022 97  70 - 99 mg/dL Final   Glucose reference range applies only to samples taken after fasting for at least 8 hours.   BUN 12/24/2022 9  6 - 20 mg/dL Final   Creatinine 56/21/3086 0.83  0.61 - 1.24 mg/dL Final   Calcium 57/84/6962 9.0  8.9 - 10.3 mg/dL Final   Total Protein 95/28/4132 7.3  6.5 - 8.1 g/dL Final   Albumin 44/02/270 4.7  3.5 - 5.0 g/dL Final   AST 53/66/4403 14 (L)  15 - 41 U/L Final   ALT 12/24/2022 7  0 - 44 U/L Final   Alkaline Phosphatase 12/24/2022 58  38 - 126 U/L Final   Total Bilirubin 12/24/2022 0.5  <1.2 mg/dL Final   GFR, Estimated 12/24/2022 >60  >60 mL/min Final   Comment: (NOTE) Calculated using the CKD-EPI Creatinine Equation (2021)    Anion gap 12/24/2022 9  5 - 15 Final   Performed at [facility], [address], [place], [place] [address]   WBC Count 12/24/2022 3.4 (L)  4.0 - 10.5 K/uL Final   RBC 12/24/2022 4.17 (L)  4.22 - 5.81 MIL/uL Final   Hemoglobin 12/24/2022 12.8 (L)  13.0 - 17.0 g/dL Final   HCT 95/63/8756 38.3 (L)  39.0 - 52.0 % Final   MCV 12/24/2022 91.8  80.0 - 100.0 fL Final   MCH 12/24/2022 30.7  26.0 - 34.0 pg Final   MCHC 12/24/2022 33.4  30.0 - 36.0 g/dL Final   RDW 43/32/9518 12.4  11.5 - 15.5 % Final   Platelet Count 12/24/2022 197  150 - 400 K/uL Final   nRBC 12/24/2022 0.0  0.0 - 0.2 % Final   Neutrophils Relative % 12/24/2022 45  % Final   Neutro Abs 12/24/2022 1.5 (L)  1.7 - 7.7 K/uL Final   Lymphocytes Relative 12/24/2022 41  % Final    Lymphs Abs 12/24/2022 1.4  0.7 - 4.0 K/uL Final   Monocytes Relative 12/24/2022 10  % Final   Monocytes Absolute 12/24/2022 0.3  0.1 - 1.0 K/uL Final   Eosinophils Relative 12/24/2022 3  % Final   Eosinophils Absolute 12/24/2022 0.1  0.0 - 0.5 K/uL Final   Basophils Relative 12/24/2022 1  % Final   Basophils Absolute 12/24/2022 0.0  0.0 - 0.1 K/uL Final   Immature Granulocytes 12/24/2022 0  % Final   Abs Immature Granulocytes 12/24/2022 0.01  0.00 - 0.07 K/uL Final   Performed at [facility], [address] [place], [place] [address]   CEA ([facility]) 12/24/2022 2.51  0.00 - 5.00 ng/mL Final   Comment: (NOTE) This test was performed using Beckman Coulter's paramagnetic chemiluminescent immunoassay. Values obtained from different assay methods cannot be  used interchangeably. Please note that up to 8% of patients who smoke may see values 5.1-10.0 ng/ml and 1% of patients who smoke may see CEA levels >10.0 ng/ml. Performed at [facility], [address], [place], [place] [address]   Appointment on 09/24/2022  Component Date Value Ref Range Status   CEA ([facility]) 09/24/2022 2.38  0.00 - 5.00 ng/mL Final   Comment: (NOTE) This test was performed using Beckman Coulter's paramagnetic chemiluminescent immunoassay. Values obtained from different assay methods cannot be used interchangeably. Please note that up to 8% of patients who smoke may see values 5.1-10.0 ng/ml and 1% of patients who smoke may see CEA levels >10.0 ng/ml. Performed at [facility], [address], [place], [place] [address]     Allergies: Crab extract, Dilaudid [hydromorphone], and Seroquel [quetiapine]  Medications:  Facility Ordered Medications  Medication   acetaminophen (TYLENOL) tablet 650 mg   alum & mag hydroxide-simeth (MAALOX/MYLANTA) 200-200-20 MG/5ML suspension 30 mL   magnesium hydroxide (MILK OF MAGNESIA) suspension 30 mL   hydrOXYzine (ATARAX)  tablet 25 mg   buPROPion (WELLBUTRIN XL) 24 hr tablet 300 mg   traZODone (DESYREL) tablet 100 mg   haloperidol (HALDOL) tablet 5 mg   Or   haloperidol lactate (HALDOL) injection 5 mg   LORazepam (ATIVAN) tablet 2 mg   Or   LORazepam (ATIVAN) injection 2 mg   diphenhydrAMINE (BENADRYL) capsule 50 mg   Or   diphenhydrAMINE (BENADRYL) injection 50 mg   dicyclomine (BENTYL) tablet 20 mg   hydrOXYzine (ATARAX) tablet 25 mg   loperamide (IMODIUM) capsule 2-4 mg   methocarbamol (ROBAXIN) tablet 500 mg   naproxen (NAPROSYN) tablet 500 mg   ondansetron (ZOFRAN-ODT) disintegrating tablet 4 mg   cloNIDine (CATAPRES) tablet 0.1 mg   Followed by   Melene Muller ON 03/21/2023] cloNIDine (CATAPRES) tablet 0.1 mg   Followed by   Melene Muller ON 03/23/2023] cloNIDine (CATAPRES) tablet 0.1 mg   PTA Medications  Medication Sig   acetaminophen (TYLENOL) 500 MG tablet Take 500 mg by mouth 2 (two) times daily as needed for moderate pain (pain score 4-6), headache or fever.   colesevelam (WELCHOL) 625 MG tablet Take 3 tablets (1,875 mg total) by mouth 2 (two) times daily. Take with meals. (Patient not taking: Reported on 03/18/2023)   buPROPion (WELLBUTRIN XL) 300 MG 24 hr tablet Take 1 tablet (300 mg total) by mouth every morning. (Patient not taking: Reported on 03/18/2023)   colestipol (COLESTID) 1 g tablet Take 1 tablet (1 g total) by mouth 3 (three) times daily as needed (diarrhea/fecal urgency). Do not take within 2 hours of other medications.   buPROPion (WELLBUTRIN XL) 150 MG 24 hr tablet Take 300 mg by mouth daily.    Long Term Goals: Improvement in symptoms so as ready for discharge  Short Term Goals: Patient will verbalize feelings in meetings with treatment team members., Patient will attend at least of 50% of the groups daily., Pt will complete the PHQ9 on admission, day 3 and discharge., Patient will participate in completing the [place] Suicide Severity Rating Scale, Patient will score a low risk of  violence for 24 hours prior to discharge, and Patient will take medications as prescribed daily.  Medical Decision Making  Inpatient FBC Meds ordered this encounter  Medications   acetaminophen (TYLENOL) tablet 650 mg   alum & mag hydroxide-simeth (MAALOX/MYLANTA) 200-200-20 MG/5ML suspension 30 mL   magnesium hydroxide (MILK OF MAGNESIA) suspension 30 mL   hydrOXYzine (ATARAX) tablet 25  mg   buPROPion (WELLBUTRIN XL) 24 hr tablet 300 mg   traZODone (DESYREL) tablet 100 mg   OR Linked Order Group    haloperidol (HALDOL) tablet 5 mg    haloperidol lactate (HALDOL) injection 5 mg   OR Linked Order Group    LORazepam (ATIVAN) tablet 2 mg    LORazepam (ATIVAN) injection 2 mg   OR Linked Order Group    diphenhydrAMINE (BENADRYL) capsule 50 mg    diphenhydrAMINE (BENADRYL) injection 50 mg   dicyclomine (BENTYL) tablet 20 mg   hydrOXYzine (ATARAX) tablet 25 mg   loperamide (IMODIUM) capsule 2-4 mg   methocarbamol (ROBAXIN) tablet 500 mg   naproxen (NAPROSYN) tablet 500 mg   ondansetron (ZOFRAN-ODT) disintegrating tablet 4 mg   FOLLOWED BY Linked Order Group    cloNIDine (CATAPRES) tablet 0.1 mg    cloNIDine (CATAPRES) tablet 0.1 mg    cloNIDine (CATAPRES) tablet 0.1 mg       Recommendations  Based on my evaluation the patient does not appear to have an emergency medical condition.  Sindy Guadeloupe, NP 03/19/23  5:22 AM    Isa Rankin, MD 04/06/23 [phone]

## 2023-03-20 ENCOUNTER — Encounter (HOSPITAL_COMMUNITY): Payer: Self-pay | Admitting: Psychiatry

## 2023-03-20 MED ORDER — NICOTINE 14 MG/24HR TD PT24
14.0000 mg | MEDICATED_PATCH | Freq: Every day | TRANSDERMAL | Status: DC
  Filled 2023-03-20 (×2): qty 1

## 2023-03-20 MED ORDER — DULOXETINE HCL 20 MG PO CPEP
20.0000 mg | ORAL_CAPSULE | Freq: Every day | ORAL | Status: DC
Start: 2023-03-21 — End: 2023-03-21
  Administered 2023-03-21: 20 mg via ORAL
  Filled 2023-03-20: qty 1

## 2023-03-20 MED ORDER — HYDROXYZINE HCL 25 MG PO TABS: 50.0000 mg | ORAL_TABLET | Freq: Every evening | ORAL | Status: DC | PRN

## 2023-03-20 NOTE — ED Notes (Signed)
 Patient resting with eyes closed in no apparent acute distress. Respirations even and unlabored. Environment secured. Safety checks in place according to facility policy.

## 2023-03-20 NOTE — ED Notes (Signed)
Patient on phone in hallway, patient calm and collected however voices frustrations regarding stay in many ways. Patient voices agitation and irritability, presents in no acute distress. Patient states they are "pissed off". Environment secured, safety checks in place per facility policy.

## 2023-03-20 NOTE — ED Provider Notes (Signed)
Behavioral Health Progress Note  Date and Time: 03/20/2023 7:16 AM Name: Jeremiah Allen MRN:  [id number]  Subjective:  "I have not been using last weeks and that caused my anxiety to increase and I started using more methamphetamine."   HPI:  Jeremiah Allen, 38 y/o male with a history of substance abuse (methamphetamines), bipolar disorder, PTSD, ADHD and adjustment disorder.  Presented to [facility] as a transfer from the ED.  Patient was previously admitted to [facility] for substance abuse.  Patient originally came into the ED for suicide attempt.   Patient continues to endorse suicide attempt by methamphetamine overdose.  He states that typically he marijuana at least 3 times a day starting as early as possible in the morning due to waking up with anxiety 4-5 AM and having racing thoughts around 6 AM.  He states that the marijuana does decrease his anxiety and his racing thoughts.  Patient is prescribed Wellbutrin XL 300 mg which he states controls his anger. Patient follows with psychiatry at [facility] behavioral health and has therapy in place.  His next medical appointment is 04/09/2023.  Patient gives permission to contact his mental health providers regarding discharge planning.  Patient states he is a stage IV colorectal cancer survivor, currently in remission.  He feels frustrated that he is unable to have his usual home supplies for behavioral incontinence while he is admitted.  He denies any other somatic complaints.  He endorses poor sleep and notes that he is leaves no more than a day.  He admits that this is not consolidated sleep.  Regularly feels like he has increased energy and racing thoughts.  He believes the symptoms are related to his new diagnosis of ADHD.  Patient endorses using amphetamines since 2022.  He endorses typically using methamphetamine Monday, Tuesday, and Wednesday even while prescribed psychotropic medications.   He states that he "overdosed on the  methamphetamine as a suicide attempt after not getting his job".   Face-to-face evaluation of patient, patient is alert and oriented x 4, speech is clear, maintain eye contact.  Patient answer questions appropriately.  Patient reported that when he came into the hospital he suicidal but stated he is not suicidal anymore.   Initially he reported that he used methamphetamines on a regular and now wanted rehab.  He, however declines CD IOP, noting he does not do well with group therapy since being in AA/NA since 38 years old with his parents.   He does not want substance use residential treatment. He is hopeful to continue in treatment with his outpatient psychiatrist and therapist.  Patient denies HI, AVH or paranoia.  Denied current alcohol use.  At this time patient does not seem to be a risk to himself or others does not show any sign of distress. Patient home medication were restarted.    Diagnosis:  Final diagnoses:  Substance abuse (HCC)  Suicidal ideation  Behavior concern in adult   Total Time Spent in Direct Patient Care:  I personally spent 80 minutes on the unit in direct patient care. The direct patient care time included face-to-face time with the patient, reviewing the patient's chart, communicating with other professionals, and coordinating care. Greater than 50% of this time was spent in counseling or coordinating care with the patient regarding goals of hospitalization, psycho-education, and discharge planning needs.   Past Psychiatric History:  substance abuse (methamphetamines), bipolar disorder, PTSD, ADHD and adjustment disorder.    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  Lives with a roommate who he says is supportive                    Sleep: Poor patient states that he either does not sleep or sleeps through the day.    Appetite:  Fair  Current Medications:  Current Facility-Administered Medications  Medication Dose Route Frequency Provider Last Rate Last Admin   acetaminophen (TYLENOL) tablet 650 mg  650 mg Oral Q6H PRN Motley-Mangrum, Jadeka A, PMHNP       alum & mag hydroxide-simeth (MAALOX/MYLANTA) 200-200-20 MG/5ML suspension 30 mL  30 mL Oral Q4H PRN Motley-Mangrum, Jadeka A, PMHNP       buPROPion (WELLBUTRIN XL) 24 hr tablet 300 mg  300 mg Oral Daily Motley-Mangrum, Jadeka A, PMHNP   300 mg at 03/19/23 0950   cloNIDine (CATAPRES) tablet 0.1 mg  0.1 mg Oral QID Sindy Guadeloupe, NP   0.1 mg at 03/19/23 1409   Followed by   Melene Muller ON 03/21/2023] cloNIDine (CATAPRES) tablet 0.1 mg  0.1 mg Oral Elgie Collard, NP       Followed by   Melene Muller ON 03/23/2023] cloNIDine (CATAPRES) tablet 0.1 mg  0.1 mg Oral QAC breakfast Sindy Guadeloupe, NP       colestipol (COLESTID) tablet 1 g  1 g Oral TID Mariel Craft, MD   1 g at 03/19/23 2208   dicyclomine (BENTYL) tablet 20 mg  20 mg Oral Q6H PRN Sindy Guadeloupe, NP       diphenhydrAMINE (BENADRYL) capsule 50 mg  50 mg Oral Q6H PRN Lenox Ponds, NP       Or   diphenhydrAMINE (BENADRYL) injection 50 mg  50 mg Intramuscular Q6H PRN Lenox Ponds, NP       guanFACINE (TENEX) tablet 1 mg  1 mg Oral Q breakfast Mariel Craft, MD   1 mg at 03/19/23 1813   guanFACINE (TENEX) tablet 2 mg  2 mg Oral QHS Mariel Craft, MD   2 mg at 03/19/23 2208   haloperidol (HALDOL) tablet 5 mg  5 mg Oral Q6H PRN Lenox Ponds, NP       Or    haloperidol lactate (HALDOL) injection 5 mg  5 mg Intramuscular Q6H PRN Lenox Ponds, NP       hydrOXYzine (ATARAX) tablet 25 mg  25 mg Oral Q6H PRN Sindy Guadeloupe, NP       Melene Muller ON 03/24/2023] hydrOXYzine (ATARAX) tablet 25 mg  25 mg Oral TID PRN Rex Kras, MD       loperamide (IMODIUM) capsule 2-4 mg  2-4 mg Oral PRN Sindy Guadeloupe, NP       LORazepam (ATIVAN) tablet 2 mg  2 mg Oral Q6H PRN Lenox Ponds, NP   2  mg at 03/19/23 0981   Or   LORazepam (ATIVAN) injection 2 mg  2 mg Intramuscular Q6H PRN Lenox Ponds, NP       magnesium hydroxide (MILK OF MAGNESIA) suspension 30 mL  30 mL Oral Daily PRN Motley-Mangrum, Geralynn Ochs A, PMHNP       methocarbamol (ROBAXIN) tablet 500 mg  500 mg Oral Q8H PRN Sindy Guadeloupe, NP       naproxen (NAPROSYN) tablet 500 mg  500 mg Oral BID PRN Sindy Guadeloupe, NP   500 mg at 03/19/23 0950   ondansetron (ZOFRAN-ODT) disintegrating tablet 4 mg  4 mg Oral Q6H PRN Sindy Guadeloupe, NP       traZODone (DESYREL) tablet 100 mg  100 mg Oral QHS PRN Motley-Mangrum, Ezra Sites, PMHNP   100 mg at 03/19/23 2208   Current Outpatient Medications  Medication Sig Dispense Refill   acetaminophen (TYLENOL) 500 MG tablet Take 500 mg by mouth 2 (two) times daily as needed for moderate pain (pain score 4-6), headache or fever.     buPROPion (WELLBUTRIN XL) 150 MG 24 hr tablet Take 300 mg by mouth daily.     buPROPion (WELLBUTRIN XL) 300 MG 24 hr tablet Take 1 tablet (300 mg total) by mouth every morning. (Patient not taking: Reported on 03/18/2023) 30 tablet 1   colesevelam (WELCHOL) 625 MG tablet Take 3 tablets (1,875 mg total) by mouth 2 (two) times daily. Take with meals. (Patient not taking: Reported on 03/18/2023) 180 tablet 2   colestipol (COLESTID) 1 g tablet Take 1 tablet (1 g total) by mouth 3 (three) times daily as needed (diarrhea/fecal urgency). Do not take within 2 hours of other medications. 90 tablet 3    Labs  Lab Results:  Admission on 03/18/2023, Discharged  on 03/18/2023  Component Date Value Ref Range Status   Sodium 03/18/2023 136  135 - 145 mmol/L Final   Potassium 03/18/2023 3.4 (L)  3.5 - 5.1 mmol/L Final   Chloride 03/18/2023 103  98 - 111 mmol/L Final   CO2 03/18/2023 21 (L)  22 - 32 mmol/L Final   Glucose, Bld 03/18/2023 103 (H)  70 - 99 mg/dL Final   Glucose reference range applies only to samples taken after fasting for at least 8 hours.   BUN 03/18/2023 15  6 - 20 mg/dL Final   Creatinine, Ser 03/18/2023 1.04  0.61 - 1.24 mg/dL Final   Calcium 19/14/7829 9.4  8.9 - 10.3 mg/dL Final   Total Protein 56/21/3086 7.5  6.5 - 8.1 g/dL Final   Albumin 57/84/6962 4.4  3.5 - 5.0 g/dL Final   AST 95/28/4132 29  15 - 41 U/L Final   ALT 03/18/2023 23  0 - 44 U/L Final   Alkaline Phosphatase 03/18/2023 66  38 - 126 U/L Final   Total Bilirubin 03/18/2023 0.9  0.0 - 1.2 mg/dL Final   GFR, Estimated 03/18/2023 >60  >60 mL/min Final   Comment: (NOTE) Calculated using the CKD-EPI Creatinine Equation (2021)    Anion gap 03/18/2023 12  5 - 15 Final   Performed at [facility], [address] [place], [place] [address]   Alcohol, Ethyl (B) 03/18/2023 <10  <10 mg/dL Final   Comment: (NOTE) Lowest detectable limit for serum alcohol is 10 mg/dL.  For medical purposes only. Performed at [facility], [address], [place], [place] [address]    Salicylate Lvl 03/18/2023 <7.0 (L)  7.0 - 30.0 mg/dL Final   Performed  at [facility], [address], [place], [place] [address]   Acetaminophen (Tylenol), Serum 03/18/2023 <10 (L)  10 - 30 ug/mL Final   Comment: (NOTE) Therapeutic concentrations vary significantly. A range of 10-30 ug/mL  may be an effective concentration for many patients. However, some  are best treated at concentrations outside of this range. Acetaminophen concentrations >150 ug/mL at 4 hours after ingestion  and >50 ug/mL at 12 hours after ingestion are often associated with  toxic reactions.  Performed at  [facility], [address], [place], [place] [address]    WBC 03/18/2023 5.8  4.0 - 10.5 K/uL Final   RBC 03/18/2023 4.34  4.22 - 5.81 MIL/uL Final   Hemoglobin 03/18/2023 13.6  13.0 - 17.0 g/dL Final   HCT 09/81/1914 40.4  39.0 - 52.0 % Final   MCV 03/18/2023 93.1  80.0 - 100.0 fL Final   MCH 03/18/2023 31.3  26.0 - 34.0 pg Final   MCHC 03/18/2023 33.7  30.0 - 36.0 g/dL Final   RDW 78/29/5621 12.6  11.5 - 15.5 % Final   Platelets 03/18/2023 219  150 - 400 K/uL Final   nRBC 03/18/2023 0.0  0.0 - 0.2 % Final   Performed at [facility], [address] [place], [place] [address]   Opiates 03/18/2023 NONE DETECTED  NONE DETECTED Final   Cocaine 03/18/2023 NONE DETECTED  NONE DETECTED Final   Benzodiazepines 03/18/2023 NONE DETECTED  NONE DETECTED Final   Amphetamines 03/18/2023 POSITIVE (A)  NONE DETECTED Final   Tetrahydrocannabinol 03/18/2023 POSITIVE (A)  NONE DETECTED Final   Barbiturates 03/18/2023 NONE DETECTED  NONE DETECTED Final   Comment: (NOTE) DRUG SCREEN FOR MEDICAL PURPOSES ONLY.  IF CONFIRMATION IS NEEDED FOR ANY PURPOSE, NOTIFY LAB WITHIN 5 DAYS.  LOWEST DETECTABLE LIMITS FOR URINE DRUG SCREEN Drug Class                     Cutoff (ng/mL) Amphetamine and metabolites    1000 Barbiturate and metabolites    200 Benzodiazepine                 200 Opiates and metabolites        300 Cocaine and metabolites        300 THC                            50 Performed at [facility], [address] [place], [place] [address]    Troponin I (High Sensitivity) 03/18/2023 3  <18 ng/L Final   Comment: (NOTE) Elevated high sensitivity troponin I (hsTnI) values and significant  changes across serial measurements may suggest ACS but many other  chronic and acute conditions are known to elevate hsTnI results.  Refer to the "Links" section for chest pain algorithms and additional  guidance. Performed at [facility], [address], [place],  [place] [address]    Specimen Description 03/18/2023 BLOOD RIGHT ANTECUBITAL   Final   Special Requests 03/18/2023 BOTTLES DRAWN AEROBIC AND ANAEROBIC Blood Culture adequate volume   Final   Culture 03/18/2023    Final                   Value:NO GROWTH < 24 HOURS Performed at [facility], [address] [place], [place] [address]    Report Status 03/18/2023 PENDING   Incomplete   Specimen Description  03/18/2023 BLOOD LEFT ANTECUBITAL   Final   Special Requests 03/18/2023 BOTTLES DRAWN AEROBIC ONLY Blood Culture adequate volume   Final   Culture 03/18/2023    Final                   Value:NO GROWTH < 24 HOURS Performed at [facility], [address], [place], [place] [address]    Report Status 03/18/2023 PENDING   Incomplete   Troponin I (High Sensitivity) 03/18/2023 3  <18 ng/L Final   Comment: (NOTE) Elevated high sensitivity troponin I (hsTnI) values and significant  changes across serial measurements may suggest ACS but many other  chronic and acute conditions are known to elevate hsTnI results.  Refer to the "Links" section for chest pain algorithms and additional  guidance. Performed at [facility], [address] [place], [place] [address]    SARS Coronavirus 2 by RT PCR 03/18/2023 NEGATIVE  NEGATIVE Final   Performed at [facility], [address] [place], [place] [address]  Appointment on 12/24/2022  Component Date Value Ref Range Status   Sodium 12/24/2022 136  135 - 145 mmol/L Final   Potassium 12/24/2022 3.7  3.5 - 5.1 mmol/L Final   Chloride 12/24/2022 100  98 - 111 mmol/L Final   CO2 12/24/2022 27  22 - 32 mmol/L Final   Glucose, Bld 12/24/2022 97  70 - 99 mg/dL Final   Glucose reference range applies only to samples taken after fasting for at least 8 hours.   BUN 12/24/2022 9  6 - 20 mg/dL Final   Creatinine 91/47/8295 0.83  0.61 - 1.24 mg/dL Final   Calcium 62/13/0865 9.0  8.9 - 10.3 mg/dL Final   Total Protein 78/46/9629 7.3  6.5 - 8.1  g/dL Final   Albumin 52/84/1324 4.7  3.5 - 5.0 g/dL Final   AST 40/11/2723 14 (L)  15 - 41 U/L Final   ALT 12/24/2022 7  0 - 44 U/L Final   Alkaline Phosphatase 12/24/2022 58  38 - 126 U/L Final   Total Bilirubin 12/24/2022 0.5  <1.2 mg/dL Final   GFR, Estimated 12/24/2022 >60  >60 mL/min Final   Comment: (NOTE) Calculated using the CKD-EPI Creatinine Equation (2021)    Anion gap 12/24/2022 9  5 - 15 Final   Performed at [facility], [address] [place], [place] [address]   WBC Count 12/24/2022 3.4 (L)  4.0 - 10.5 K/uL Final   RBC 12/24/2022 4.17 (L)  4.22 - 5.81 MIL/uL Final   Hemoglobin 12/24/2022 12.8 (L)  13.0 - 17.0 g/dL Final   HCT 03/47/4259 38.3 (L)  39.0 - 52.0 % Final   MCV 12/24/2022 91.8  80.0 - 100.0 fL Final   MCH 12/24/2022 30.7  26.0 - 34.0 pg Final   MCHC 12/24/2022 33.4  30.0 - 36.0 g/dL Final   RDW 56/38/7564 12.4  11.5 - 15.5 % Final   Platelet Count 12/24/2022 197  150 - 400 K/uL Final   nRBC 12/24/2022 0.0  0.0 - 0.2 % Final   Neutrophils Relative % 12/24/2022 45  % Final   Neutro Abs 12/24/2022 1.5 (L)  1.7 - 7.7 K/uL Final   Lymphocytes Relative 12/24/2022 41  % Final   Lymphs Abs 12/24/2022 1.4  0.7 - 4.0 K/uL Final   Monocytes Relative 12/24/2022 10  % Final   Monocytes Absolute 12/24/2022 0.3  0.1 - 1.0 K/uL Final   Eosinophils Relative 12/24/2022 3  %  Final   Eosinophils Absolute 12/24/2022 0.1  0.0 - 0.5 K/uL Final   Basophils Relative 12/24/2022 1  % Final   Basophils Absolute 12/24/2022 0.0  0.0 - 0.1 K/uL Final   Immature Granulocytes 12/24/2022 0  % Final   Abs Immature Granulocytes 12/24/2022 0.01  0.00 - 0.07 K/uL Final   Performed at [facility], [address], [place], [place] [address]   CEA [facility]) 12/24/2022 2.51  0.00 - 5.00 ng/mL Final   Comment: (NOTE) This test was performed using Beckman Coulter's paramagnetic chemiluminescent immunoassay. Values obtained from different assay methods  cannot be used interchangeably. Please note that up to 8% of patients who smoke may see values 5.1-10.0 ng/ml and 1% of patients who smoke may see CEA levels >10.0 ng/ml. Performed at [facility], [address], [place], [place] [address]   Appointment on 09/24/2022  Component Date Value Ref Range Status   CEA ([facility]) 09/24/2022 2.38  0.00 - 5.00 ng/mL Final   Comment: (NOTE) This test was performed using Beckman Coulter's paramagnetic chemiluminescent immunoassay. Values obtained from different assay methods cannot be used interchangeably. Please note that up to 8% of patients who smoke may see values 5.1-10.0 ng/ml and 1% of patients who smoke may see CEA levels >10.0 ng/ml. Performed at [facility], [address], [place], [place] [address]     Blood Alcohol level:  Lab Results  Component Value Date   [facility] <10 03/18/2023    Metabolic Disorder Labs: No results found for: "HGBA1C", "MPG" No results found for: "PROLACTIN" No results found for: "CHOL", "TRIG", "HDL", "CHOLHDL", "VLDL", "LDLCALC"  Therapeutic Lab Levels: No results found for: "LITHIUM" No results found for: "VALPROATE" No results found for: "CBMZ"  Physical Findings   GAD-7    Flowsheet Row Counselor from 10/29/2022 in [facility]  Total GAD-7 Score 20      PHQ2-9    Flowsheet Row ED from 03/19/2023 in [facility] Counselor from 10/29/2022 in [facility] Social Work from 04/29/2021 in [facility] [place]  PHQ-2 Total Score 4 4 2   PHQ-9 Total Score 9 21 6       Flowsheet Row ED from 03/19/2023 in [facility] ED from 03/18/2023 in [facility] Emergency Department at [facility] Counselor from 10/29/2022 in [facility]  C-SSRS RISK CATEGORY High Risk Error: Q2 is Yes,  you must answer 3, 4, and 5 Low Risk        Musculoskeletal  Strength & Muscle Tone: within normal limits Gait & Station:  Feels dizzy from medication Patient leans: N/A  Psychiatric Specialty Exam  Presentation  General Appearance:  Casual  Eye Contact: Good  Speech: Clear and Coherent  Speech Volume: Normal  Handedness: Right   Mood and Affect  Mood: Anxious  Affect: Appropriate   Thought Process  Thought Processes: Coherent  Descriptions of Associations:Intact  Orientation:Full (Time, Place and Person)  Thought Content:Logical  Diagnosis of Schizophrenia or Schizoaffective disorder in past: No    Hallucinations:Hallucinations: None  Ideas of Reference:None  Suicidal Thoughts:Suicidal Thoughts: No  Homicidal Thoughts:Homicidal Thoughts: No   Sensorium  Memory: Immediate Fair  Judgment: Fair  Insight: Fair   Art therapist  Concentration: Fair  Attention Span: Good  Recall: Good  Fund of Knowledge: Good  Language: Good   Psychomotor Activity  Psychomotor Activity: Psychomotor Activity:  Normal   Assets  Assets: Desire for Improvement; Social Support; Resilience   Sleep  Sleep: Sleep: Fair Number of Hours of Sleep: 6   Nutritional Assessment (For OBS and FBC admissions only) Has the patient had a weight loss or gain of 10 pounds or more in the last 3 months?: No Has the patient had a decrease in food intake/or appetite?: No Does the patient have dental problems?: No Does the patient have eating habits or behaviors that may be indicators of an eating disorder including binging or inducing vomiting?: No Has the patient recently lost weight without trying?: 0 Has the patient been eating poorly because of a decreased appetite?: 0 Malnutrition Screening Tool Score: 0    Physical Exam  Physical Exam Constitutional:      Appearance: Normal appearance. He is normal weight.  HENT:     Head: Normocephalic  and atraumatic.     Nose: Nose normal.  Eyes:     Conjunctiva/sclera: Conjunctivae normal.  Cardiovascular:     Rate and Rhythm: Normal rate and regular rhythm.  Pulmonary:     Effort: Pulmonary effort is normal. No respiratory distress.  Musculoskeletal:        General: Normal range of motion.     Cervical back: Normal range of motion.  Neurological:     General: No focal deficit present.     Mental Status: He is alert and oriented to person, place, and time.     Cranial Nerves: No cranial nerve deficit.  Psychiatric:        Mood and Affect: Mood normal.    Review of Systems  Constitutional:  Positive for malaise/fatigue. Negative for chills and fever.  Respiratory:  Negative for shortness of breath.   Cardiovascular:  Negative for chest pain and palpitations.  Gastrointestinal:        Loose stools r/t Colon CA treatment  Musculoskeletal:  Negative for myalgias.  Neurological:  Positive for dizziness. Negative for weakness and headaches.  Psychiatric/Behavioral:  Positive for substance abuse. Negative for depression, hallucinations and suicidal ideas. The patient is nervous/anxious and has insomnia.    Blood pressure (!) 101/50, pulse 62, temperature 97.8 F (36.6 C), temperature source Oral, resp. rate 16, SpO2 99%. There is no height or weight on file to calculate BMI.  Treatment Plan Summary: Daily contact with patient to assess and evaluate symptoms and progress in treatment and Medication management    ## Psychiatric Medication Recommendations:  -Start COWS protocol -Continue Wellbutrin 300 mg p.o. daily -Trazodone 100 mg p.o. nightly as needed -Start guanfacine 1 mg in the morning and 2 mg at bedtime for ADHD, noted side effect of sedation for sleep onset.  Reviewed with patient need to commit to at least 6 hours of consolidated sleep a day.  He will choose to sleep between 2 AM and 8 AM.  He is aware that he will need to be in his room earlier while admitted to  [facility].  -Agitation protocol medications and sleep aids in place.   ## Medical Decision Making Capacity: Not specifically addressed in this encounter   ## Disposition:--Will attempt to make sooner appointment with outpatient psychiatrist and therapist as patient declines substance use rehabilitation at this time.   ## Behavioral / Environmental: -Safety/agitation precautions  Mariel Craft, MD 03/19/2023 4:04 PM

## 2023-03-20 NOTE — ED Notes (Signed)
Patient is resting with eyes closed with even unlabored breathing. No s/s of distress or withdrawals. Staff will continue to monitor safety and changes in condition.

## 2023-03-20 NOTE — ED Notes (Signed)
Patient is very anxious, restless legs, holding his head and talking to himself. He has c/o of other peers snoring at night. He was asked if he wanted to change rooms and he replied "I don't want to be any closer to the snoring". He also c/o his door making noise and being sensitive to the light when staff leaves the door slightly open. Ativan 2mg  Po given for anxiety.

## 2023-03-20 NOTE — ED Notes (Signed)
Patient is calm and cooperative and has been visible on the unit without any distress noted. Patient was compliant with HS medications. Staff will continue to monitor safety and changes in condition.

## 2023-03-20 NOTE — Group Note (Signed)
Group Topic: Change and Accountability  Group Date: 03/20/2023 Start Time: 1030 End Time: 1115 Facilitators: Ninfa Linden, NT +3 MHT 2 Department: [facility]  Number of Participants: 3  Group Focus: personal responsibility Treatment Modality:  Behavior Modification Therapy Interventions utilized were exploration Purpose: explore maladaptive thinking  Name: Jeremiah Allen Date of Birth: November 14, 1985  MR: [id number]    Level of Participation: Patient did not attend Group Quality of Participation: N/A Interactions with others: N/A Mood/Affect: N/A Triggers (if applicable): N/A Cognition: N/A Progress: N/A Response: N/A Plan: N/A  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   Cannabis abuse 10/29/2022   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/2022   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

## 2023-03-20 NOTE — ED Notes (Signed)
Patient alert & oriented x4. Denies intent to harm self or others when asked. Denies A/VH. Patient denies any physical complaints when asked. Patient voices being unpleased regarding their stay the the unit while on the phone, patient has denies issues to staff. No acute distress noted. Scheduled medications administered with no complications. Support and encouragement provided. Routine safety checks conducted per facility protocol. Encouraged patient to notify staff if any thoughts of harm towards self or others arise. Patient verbalizes understanding and agreement.

## 2023-03-20 NOTE — ED Notes (Addendum)
Patient is resting in bed with eyes closed and aroused to name being called. He is calm and cooperative and wearing scrubs. He currently denies SIHI, AVH, and anxiety and depression. He denies withdrawal symptoms and reports sleeping okay without having nightmares. Staff will continue to monitor safety and changes in condition.

## 2023-03-20 NOTE — ED Notes (Signed)
Patient sitting in hallway, calm and collected. No acute distress noted. No concerns voiced. Informed patient to notify staff with any needs or assistance. Patient verbalized understanding or agreement. Safety checks in place per facility policy.

## 2023-03-20 NOTE — ED Notes (Addendum)
Patient seen talking on the phone after receiving HS medications stating that he hates it here and staff is passive aggressive and the food is nasty and it's freezing in his room and only his door make a lot of noise and when he spoke with the doctors today, he was told a lot of lies. The patient refused every suggestion writer made to help him cope with his anxiety like taking a hot shower and changing his room. The patient was sitting with his fist balled and shaking his legs when writer was taking his vitals. Patient received HS medications including Clonidine 0.1mg  detox and Trazodone 100mg . Staff will continue to monitor safety and changes in his condition.

## 2023-03-20 NOTE — ED Notes (Signed)
PRN Atarax given due to patient reports of anxiety. Medication administered with no complications. Environment secured, safety checks in place per facility policy.

## 2023-03-20 NOTE — Group Note (Signed)
Group Topic: Change and Accountability  Group Date: 03/19/2023 Start Time: 1930 End Time: 2000 Facilitators: Debe Coder, NT  Department: [facility]  Number of Participants: 6  Group Focus: affirmation Treatment Modality:  Individual Therapy Interventions utilized were group exercise Purpose: explore maladaptive thinking and express feelings  Name: Jeremiah Allen Date of Birth: 12-06-1985  MR: [id number]    Level of Participation: pt did not attend group Quality of Participation: pt did not attend group Interactions with others: pt did not attend group Mood/Affect: pt did not attend group Triggers (if applicable): pt did not attend group Cognition: pt did not attend group Progress: pt did not attend group Response: pt did not attend group Plan: pt did not attend group  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   Cannabis abuse 10/29/2022   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/2022   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

## 2023-03-21 ENCOUNTER — Other Ambulatory Visit (HOSPITAL_BASED_OUTPATIENT_CLINIC_OR_DEPARTMENT_OTHER): Payer: Self-pay

## 2023-03-21 LAB — IRON AND TIBC
Iron: 79 ug/dL (ref 45–182)
Saturation Ratios: 30 % (ref 17.9–39.5)
TIBC: 262 ug/dL (ref 250–450)
UIBC: 183 ug/dL

## 2023-03-21 LAB — HEMOGLOBIN A1C
Hgb A1c MFr Bld: 5.1 % (ref 4.8–5.6)
Mean Plasma Glucose: 99.67 mg/dL

## 2023-03-21 LAB — LIPID PANEL
Cholesterol: 133 mg/dL (ref 0–200)
HDL: 50 mg/dL (ref >40)
LDL Cholesterol: 70 mg/dL (ref 0–99)
Total CHOL/HDL Ratio: 2.7 {ratio}
Triglycerides: 65 mg/dL (ref <150)
VLDL: 13 mg/dL (ref 0–40)

## 2023-03-21 LAB — FERRITIN: Ferritin: 73 ng/mL (ref 24–336)

## 2023-03-21 LAB — MAGNESIUM: Magnesium: 2.3 mg/dL (ref 1.7–2.4)

## 2023-03-21 MED ORDER — NICOTINE 14 MG/24HR TD PT24
14.0000 mg | MEDICATED_PATCH | Freq: Every day | TRANSDERMAL | 0 refills | Status: DC
  Filled 2023-03-21 – 2023-04-17 (×3): qty 28, 28d supply, fill #0

## 2023-03-21 MED ORDER — DULOXETINE HCL 20 MG PO CPEP
20.0000 mg | ORAL_CAPSULE | Freq: Every day | ORAL | 0 refills | Status: DC
  Filled 2023-03-21: qty 30, 30d supply, fill #0

## 2023-03-21 NOTE — ED Provider Notes (Incomplete)
Behavioral Health Progress Note  Date and Time: 03/21/2023 9:40 AM  Name: Jeremiah Allen MRN:  [id number]  Subjective:  "I am irritated by the lack of information. It all feels like a trigger to me."   HPI:  Jeremiah Allen, 38 y/o male with a history of substance abuse (methamphetamines), bipolar disorder, PTSD, ADHD and adjustment disorder.  Presented to [facility] as a transfer from the ED.  Patient was previously admitted to [facility] for substance abuse.  Patient originally came into the ED for suicide attempt.   Patient continues to endorse suicide attempt by methamphetamine overdose. He reports his frustrations with rejection and things being out of his control. Motivational interviewing is provided at lentgth, and patient is appreciative. Patient denies HI, AVH or paranoia.   He denies somatic concerns or complaints other than fecal incontinence 2/2 colorectal cancer s/p resection hx. Due to this, he has been reluctant to eat.  He endorses poor sleep due to feeling triggered in the current environment as well as neighbor snoring.     Face-to-face evaluation of patient, patient is alert and oriented x 4, speech is clear, maintain eye contact.  Patient answer questions appropriately.  Patient reported that when he came into the hospital he was suicidal but again stated he is not suicidal anymore.    He reiterates he does not do well with group therapy since being in AA/NA since 38 years old with his parents.   He does not want substance use residential treatment, only outpatient treatment with this Clinical research associate providing psychiatric care and therapist, Richardson Dopp, LCSW.     Diagnosis:  Final diagnoses:  Substance abuse (HCC)  Suicidal ideation  Behavior concern in adult   Total Time Spent in Direct Patient Care:  I personally spent 80 minutes on the unit in direct patient care. The direct patient care time included face-to-face time with the patient, reviewing the patient's chart,  communicating with other professionals, and coordinating care. Greater than 50% of this time was spent in counseling or coordinating care with the patient regarding goals of hospitalization, psycho-education, and discharge planning needs.   Past Psychiatric History:  substance abuse (methamphetamines), bipolar disorder, PTSD, ADHD and adjustment disorder.    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  Lives with a roommate who he says is supportive  Patient endorses using amphetamines since 2022.  He endorses typically using methamphetamine Monday, Tuesday, and Wednesday even while prescribed psychotropic medications.   He states that he "overdosed on the methamphetamine as a suicide attempt after not getting his job".                    Sleep: Poor patient states that he either does not sleep or sleeps through the day.    Appetite:  Fair  Current Medications:  Current Facility-Administered Medications  Medication Dose Route Frequency Provider Last Rate Last Admin   acetaminophen (TYLENOL) tablet 650 mg  650 mg Oral Q6H PRN Motley-Mangrum, Jadeka A, PMHNP       alum & mag hydroxide-simeth (MAALOX/MYLANTA) 200-200-20 MG/5ML suspension 30 mL  30 mL Oral Q4H PRN Motley-Mangrum, Jadeka A, PMHNP       buPROPion (WELLBUTRIN XL) 24 hr tablet 300 mg  300 mg Oral Daily Motley-Mangrum, Jadeka A, PMHNP   300 mg at 03/20/23 1610    cloNIDine (CATAPRES) tablet 0.1 mg  0.1 mg Oral QID Sindy Guadeloupe, NP   0.1 mg at 03/20/23 2207   Followed by   cloNIDine (CATAPRES) tablet 0.1 mg  0.1 mg Oral Elgie Collard, NP       Followed by   Melene Muller ON 03/23/2023] cloNIDine (CATAPRES) tablet 0.1 mg  0.1 mg Oral QAC breakfast Sindy Guadeloupe, NP       colestipol (COLESTID) tablet 1 g  1 g Oral TID Mariel Craft, MD   1 g at 03/20/23 2204   dicyclomine (BENTYL) tablet 20 mg  20 mg Oral Q6H PRN Sindy Guadeloupe, NP       diphenhydrAMINE (BENADRYL) capsule 50 mg  50 mg Oral Q6H PRN Lenox Ponds, NP       Or   diphenhydrAMINE (BENADRYL) injection 50 mg  50 mg Intramuscular Q6H PRN Lenox Ponds, NP       DULoxetine (CYMBALTA) DR capsule 20 mg  20 mg Oral Daily Lamar Sprinkles, MD       guanFACINE (TENEX) tablet 2 mg  2 mg Oral QHS Mariel Craft, MD   2 mg at 03/20/23 2204   haloperidol (HALDOL) tablet 5 mg  5 mg Oral Q6H PRN Lenox Ponds, NP       Or   haloperidol lactate (HALDOL) injection 5 mg  5 mg Intramuscular Q6H PRN Lenox Ponds, NP       Melene Muller ON 03/24/2023] hydrOXYzine (ATARAX) tablet 25 mg  25 mg Oral TID PRN Rex Kras, MD       hydrOXYzine (ATARAX) tablet 50 mg  50 mg Oral QHS PRN Lamar Sprinkles, MD       loperamide (IMODIUM) capsule 2-4 mg  2-4 mg Oral PRN Sindy Guadeloupe, NP       LORazepam (ATIVAN) tablet 2 mg  2 mg Oral Q6H PRN Lenox Ponds, NP   2 mg at 03/20/23 2046   Or   LORazepam (ATIVAN) injection 2 mg  2 mg Intramuscular Q6H PRN Lenox Ponds, NP       magnesium hydroxide (MILK OF MAGNESIA) suspension 30 mL  30 mL Oral Daily PRN Motley-Mangrum, Jadeka A, PMHNP       methocarbamol (ROBAXIN) tablet 500 mg  500 mg Oral Q8H PRN Sindy Guadeloupe, NP       naproxen (NAPROSYN) tablet 500 mg  500 mg Oral BID PRN Sindy Guadeloupe, NP   500 mg at 03/19/23 0950   nicotine (NICODERM CQ - dosed in mg/24 hours) patch 14 mg  14 mg Transdermal Daily Lamar Sprinkles, MD       ondansetron (ZOFRAN-ODT)  disintegrating tablet 4 mg  4 mg Oral Q6H PRN Sindy Guadeloupe, NP       traZODone (DESYREL) tablet 100 mg  100 mg Oral QHS PRN Motley-Mangrum, Jadeka A, PMHNP   100 mg at 03/20/23 2205   Current Outpatient Medications  Medication Sig Dispense Refill   acetaminophen (  TYLENOL) 500 MG tablet Take 500 mg by mouth 2 (two) times daily as needed for moderate pain (pain score 4-6), headache or fever.     buPROPion (WELLBUTRIN XL) 150 MG 24 hr tablet Take 300 mg by mouth daily.     buPROPion (WELLBUTRIN XL) 300 MG 24 hr tablet Take 1 tablet (300 mg total) by mouth every morning. (Patient not taking: Reported on 03/18/2023) 30 tablet 1   colesevelam (WELCHOL) 625 MG tablet Take 3 tablets (1,875 mg total) by mouth 2 (two) times daily. Take with meals. (Patient not taking: Reported on 03/18/2023) 180 tablet 2   colestipol (COLESTID) 1 g tablet Take 1 tablet (1 g total) by mouth 3 (three) times daily as needed (diarrhea/fecal urgency). Do not take within 2 hours of other medications. 90 tablet 3    Labs  Lab Results:  Admission on 03/19/2023  Component Date Value Ref Range Status   Cholesterol 03/21/2023 133  0 - 200 mg/dL Final   Triglycerides 16/11/9602 65  <150 mg/dL Final   HDL 54/10/8117 50  >40 mg/dL Final   Total CHOL/HDL Ratio 03/21/2023 2.7  RATIO Final   VLDL 03/21/2023 13  0 - 40 mg/dL Final   LDL Cholesterol 03/21/2023 70  0 - 99 mg/dL Final   Comment:        Total Cholesterol/HDL:CHD Risk Coronary Heart Disease Risk Table                     Men   Women  1/2 Average Risk   3.4   3.3  Average Risk       5.0   4.4  2 X Average Risk   9.6   7.1  3 X Average Risk  23.4   11.0        Use the calculated Patient Ratio above and the CHD Risk Table to determine the patient's CHD Risk.        ATP III CLASSIFICATION (LDL):  <100     mg/dL   Optimal  147-829  mg/dL   Near or Above                    Optimal  130-159  mg/dL   Borderline  562-130  mg/dL   High  >865     mg/dL   Very  High Performed at [facility], [address], [place], [place] [address]    Hgb A1c MFr Bld 03/21/2023 5.1  4.8 - 5.6 % Final   Comment: (NOTE) Pre diabetes:          5.7%-6.4%  Diabetes:              >6.4%  Glycemic control for   <7.0% adults with diabetes    Mean Plasma Glucose 03/21/2023 99.67  mg/dL Final   Performed at [facility], [address], [place], [place] [address]  Admission on 03/18/2023, Discharged on 03/18/2023  Component Date Value Ref Range Status   Sodium 03/18/2023 136  135 - 145 mmol/L Final   Potassium 03/18/2023 3.4 (L)  3.5 - 5.1 mmol/L Final   Chloride 03/18/2023 103  98 - 111 mmol/L Final   CO2 03/18/2023 21 (L)  22 - 32 mmol/L Final   Glucose, Bld 03/18/2023 103 (H)  70 - 99 mg/dL Final   Glucose reference range applies only to samples taken after fasting for at least 8 hours.   BUN 03/18/2023 15  6 - 20  mg/dL Final   Creatinine, Ser 03/18/2023 1.04  0.61 - 1.24 mg/dL Final   Calcium 16/11/9602 9.4  8.9 - 10.3 mg/dL Final   Total Protein 54/10/8117 7.5  6.5 - 8.1 g/dL Final   Albumin 14/78/2956 4.4  3.5 - 5.0 g/dL Final   AST 21/30/8657 29  15 - 41 U/L Final   ALT 03/18/2023 23  0 - 44 U/L Final   Alkaline Phosphatase 03/18/2023 66  38 - 126 U/L Final   Total Bilirubin 03/18/2023 0.9  0.0 - 1.2 mg/dL Final   GFR, Estimated 03/18/2023 >60  >60 mL/min Final   Comment: (NOTE) Calculated using the CKD-EPI Creatinine Equation (2021)    Anion gap 03/18/2023 12  5 - 15 Final   Performed at [facility], [address], [place], [place] [address]   Alcohol, Ethyl (B) 03/18/2023 <10  <10 mg/dL Final   Comment: (NOTE) Lowest detectable limit for serum alcohol is 10 mg/dL.  For medical purposes only. Performed at [facility], [address], [place], [place] [address]    Salicylate Lvl 03/18/2023 <7.0 (L)  7.0 - 30.0 mg/dL Final   Performed at [facility], [address], [place], [place] [address]   Acetaminophen  (Tylenol), Serum 03/18/2023 <10 (L)  10 - 30 ug/mL Final   Comment: (NOTE) Therapeutic concentrations vary significantly. A range of 10-30 ug/mL  may be an effective concentration for many patients. However, some  are best treated at concentrations outside of this range. Acetaminophen concentrations >150 ug/mL at 4 hours after ingestion  and >50 ug/mL at 12 hours after ingestion are often associated with  toxic reactions.  Performed at [facility], [address], [place], [place] [address]    WBC 03/18/2023 5.8  4.0 - 10.5 K/uL Final   RBC 03/18/2023 4.34  4.22 - 5.81 MIL/uL Final   Hemoglobin 03/18/2023 13.6  13.0 - 17.0 g/dL Final   HCT 72/53/6644 40.4  39.0 - 52.0 % Final   MCV 03/18/2023 93.1  80.0 - 100.0 fL Final   MCH 03/18/2023 31.3  26.0 - 34.0 pg Final   MCHC 03/18/2023 33.7  30.0 - 36.0 g/dL Final   RDW 03/47/4259 12.6  11.5 - 15.5 % Final   Platelets 03/18/2023 219  150 - 400 K/uL Final   nRBC 03/18/2023 0.0  0.0 - 0.2 % Final   Performed at [facility], [address], [place], [place] [address]   Opiates 03/18/2023 NONE DETECTED  NONE DETECTED Final   Cocaine 03/18/2023 NONE DETECTED  NONE DETECTED Final   Benzodiazepines 03/18/2023 NONE DETECTED  NONE DETECTED Final   Amphetamines 03/18/2023 POSITIVE (A)  NONE DETECTED Final   Tetrahydrocannabinol 03/18/2023 POSITIVE (A)  NONE DETECTED Final   Barbiturates 03/18/2023 NONE DETECTED  NONE DETECTED Final   Comment: (NOTE) DRUG SCREEN FOR MEDICAL PURPOSES ONLY.  IF CONFIRMATION IS NEEDED FOR ANY PURPOSE, NOTIFY LAB WITHIN 5 DAYS.  LOWEST DETECTABLE LIMITS FOR URINE DRUG SCREEN Drug Class                     Cutoff (ng/mL) Amphetamine and metabolites    1000 Barbiturate and metabolites    200 Benzodiazepine                 200 Opiates and metabolites        300 Cocaine and metabolites        300 THC  50 Performed at [facility], [address], [place],  [place] [address]    Troponin I (High Sensitivity) 03/18/2023 3  <18 ng/L Final   Comment: (NOTE) Elevated high sensitivity troponin I (hsTnI) values and significant  changes across serial measurements may suggest ACS but many other  chronic and acute conditions are known to elevate hsTnI results.  Refer to the "Links" section for chest pain algorithms and additional  guidance. Performed at [facility], [address], [place], [place] [address]    Specimen Description 03/18/2023 BLOOD RIGHT ANTECUBITAL   Final   Special Requests 03/18/2023 BOTTLES DRAWN AEROBIC AND ANAEROBIC Blood Culture adequate volume   Final   Culture 03/18/2023    Final                   Value:NO GROWTH 3 DAYS Performed at [facility], [address], [place], [place] [address]    Report Status 03/18/2023 PENDING   Incomplete   Specimen Description 03/18/2023 BLOOD LEFT ANTECUBITAL   Final   Special Requests 03/18/2023 BOTTLES DRAWN AEROBIC ONLY Blood Culture adequate volume   Final   Culture 03/18/2023    Final                   Value:NO GROWTH 3 DAYS Performed at [facility], [address] [place], [place] [address]    Report Status 03/18/2023 PENDING   Incomplete   Troponin I (High Sensitivity) 03/18/2023 3  <18 ng/L Final   Comment: (NOTE) Elevated high sensitivity troponin I (hsTnI) values and significant  changes across serial measurements may suggest ACS but many other  chronic and acute conditions are known to elevate hsTnI results.  Refer to the "Links" section for chest pain algorithms and additional  guidance. Performed at [facility], [address] [place], [place] [address]    SARS Coronavirus 2 by RT PCR 03/18/2023 NEGATIVE  NEGATIVE Final   Performed at [facility], [address], [place], [place] [address]  Appointment on 12/24/2022  Component Date Value Ref Range Status   Sodium 12/24/2022 136  135 - 145 mmol/L Final   Potassium 12/24/2022 3.7  3.5 - 5.1  mmol/L Final   Chloride 12/24/2022 100  98 - 111 mmol/L Final   CO2 12/24/2022 27  22 - 32 mmol/L Final   Glucose, Bld 12/24/2022 97  70 - 99 mg/dL Final   Glucose reference range applies only to samples taken after fasting for at least 8 hours.   BUN 12/24/2022 9  6 - 20 mg/dL Final   Creatinine 57/84/6962 0.83  0.61 - 1.24 mg/dL Final   Calcium 95/28/4132 9.0  8.9 - 10.3 mg/dL Final   Total Protein 44/02/270 7.3  6.5 - 8.1 g/dL Final   Albumin 53/66/4403 4.7  3.5 - 5.0 g/dL Final   AST 47/42/5956 14 (L)  15 - 41 U/L Final   ALT 12/24/2022 7  0 - 44 U/L Final   Alkaline Phosphatase 12/24/2022 58  38 - 126 U/L Final   Total Bilirubin 12/24/2022 0.5  <1.2 mg/dL Final   GFR, Estimated 12/24/2022 >60  >60 mL/min Final   Comment: (NOTE) Calculated using the CKD-EPI Creatinine Equation (2021)    Anion gap 12/24/2022 9  5 - 15 Final   Performed at [facility], [address] [place], [place] [address]   WBC Count 12/24/2022 3.4 (L)  4.0 - 10.5 K/uL  Final   RBC 12/24/2022 4.17 (L)  4.22 - 5.81 MIL/uL Final   Hemoglobin 12/24/2022 12.8 (L)  13.0 - 17.0 g/dL Final   HCT 16/11/9602 38.3 (L)  39.0 - 52.0 % Final   MCV 12/24/2022 91.8  80.0 - 100.0 fL Final   MCH 12/24/2022 30.7  26.0 - 34.0 pg Final   MCHC 12/24/2022 33.4  30.0 - 36.0 g/dL Final   RDW 54/10/8117 12.4  11.5 - 15.5 % Final   Platelet Count 12/24/2022 197  150 - 400 K/uL Final   nRBC 12/24/2022 0.0  0.0 - 0.2 % Final   Neutrophils Relative % 12/24/2022 45  % Final   Neutro Abs 12/24/2022 1.5 (L)  1.7 - 7.7 K/uL Final   Lymphocytes Relative 12/24/2022 41  % Final   Lymphs Abs 12/24/2022 1.4  0.7 - 4.0 K/uL Final   Monocytes Relative 12/24/2022 10  % Final   Monocytes Absolute 12/24/2022 0.3  0.1 - 1.0 K/uL Final   Eosinophils Relative 12/24/2022 3  % Final   Eosinophils Absolute 12/24/2022 0.1  0.0 - 0.5 K/uL Final   Basophils Relative 12/24/2022 1  % Final   Basophils Absolute 12/24/2022 0.0  0.0 - 0.1  K/uL Final   Immature Granulocytes 12/24/2022 0  % Final   Abs Immature Granulocytes 12/24/2022 0.01  0.00 - 0.07 K/uL Final   Performed at [facility], [address] [place], [place] [address]   CEA ([facility]) 12/24/2022 2.51  0.00 - 5.00 ng/mL Final   Comment: (NOTE) This test was performed using Beckman Coulter's paramagnetic chemiluminescent immunoassay. Values obtained from different assay methods cannot be used interchangeably. Please note that up to 8% of patients who smoke may see values 5.1-10.0 ng/ml and 1% of patients who smoke may see CEA levels >10.0 ng/ml. Performed at [facility], [address] [place], [place] [address]   Appointment on 09/24/2022  Component Date Value Ref Range Status   CEA ([facility]) 09/24/2022 2.38  0.00 - 5.00 ng/mL Final   Comment: (NOTE) This test was performed using Beckman Coulter's paramagnetic chemiluminescent immunoassay. Values obtained from different assay methods cannot be used interchangeably. Please note that up to 8% of patients who smoke may see values 5.1-10.0 ng/ml and 1% of patients who smoke may see CEA levels >10.0 ng/ml. Performed at [facility], [address], [place], [place] [address]     Blood Alcohol level:  Lab Results  Component Value Date   [facility] <10 03/18/2023    Metabolic Disorder Labs: Lab Results  Component Value Date   HGBA1C 5.1 03/21/2023   MPG 99.67 03/21/2023   No results found for: "PROLACTIN" Lab Results  Component Value Date   CHOL 133 03/21/2023   TRIG 65 03/21/2023   HDL 50 03/21/2023   CHOLHDL 2.7 03/21/2023   VLDL 13 03/21/2023   LDLCALC 70 03/21/2023    Therapeutic Lab Levels: No results found for: "LITHIUM" No results found for: "VALPROATE" No results found for: "CBMZ"  Physical Findings   GAD-7    Flowsheet Row Counselor from 10/29/2022 in [facility]  Total GAD-7 Score 20      PHQ2-9     Flowsheet Row ED from 03/19/2023 in [facility] Counselor from 10/29/2022 in [facility] Social Work from 04/29/2021 in [facility] [place]  PHQ-2 Total Score 4 4 2   PHQ-9 Total Score 9  21 6      Flowsheet Row ED from 03/19/2023 in [facility] ED from 03/18/2023 in [facility] Department at [facility] Counselor from 10/29/2022 in [facility]  C-SSRS RISK CATEGORY High Risk Error: Q2 is Yes, you must answer 3, 4, and 5 Low Risk        Musculoskeletal  Strength & Muscle Tone: within normal limits Gait & Station:  Feels dizzy from medication Patient leans: N/A  Psychiatric Specialty Exam  Presentation  General Appearance:  Casual  Eye Contact: Good  Speech: Clear and Coherent  Speech Volume: Normal  Handedness: Right   Mood and Affect  Mood: Anxious  Affect: Appropriate   Thought Process  Thought Processes: Coherent  Descriptions of Associations:Intact  Orientation:Full (Time, Place and Person)  Thought Content:Logical  Diagnosis of Schizophrenia or Schizoaffective disorder in past: No    Hallucinations:No data recorded  Ideas of Reference:None  Suicidal Thoughts:No data recorded  Homicidal Thoughts:No data recorded   Sensorium  Memory: Immediate Fair  Judgment: Fair  Insight: Fair   Art therapist  Concentration: Fair  Attention Span: Good  Recall: Good  Fund of Knowledge: Good  Language: Good   Psychomotor Activity  Psychomotor Activity: No data recorded   Assets  Assets: Desire for Improvement; Social Support; Resilience   Sleep  Sleep: No data recorded   No data recorded   Physical Exam  Physical Exam Constitutional:      Appearance: Normal appearance. He is normal weight.  HENT:     Head: Normocephalic and atraumatic.      Nose: Nose normal.  Eyes:     Conjunctiva/sclera: Conjunctivae normal.  Cardiovascular:     Rate and Rhythm: Normal rate and regular rhythm.  Pulmonary:     Effort: Pulmonary effort is normal. No respiratory distress.  Musculoskeletal:        General: Normal range of motion.     Cervical back: Normal range of motion.  Neurological:     General: No focal deficit present.     Mental Status: He is alert and oriented to person, place, and time.     Cranial Nerves: No cranial nerve deficit.  Psychiatric:        Mood and Affect: Mood normal.    Review of Systems  Constitutional:  Positive for malaise/fatigue. Negative for chills and fever.  Respiratory:  Negative for shortness of breath.   Cardiovascular:  Negative for chest pain and palpitations.  Gastrointestinal:        Loose stools r/t Colon CA treatment  Musculoskeletal:  Negative for myalgias.  Neurological:  Positive for dizziness. Negative for weakness and headaches.  Psychiatric/Behavioral:  Positive for substance abuse. Negative for depression, hallucinations and suicidal ideas. The patient is nervous/anxious and has insomnia.    Blood pressure (!) 96/55, pulse (!) 54, temperature (!) 97.5 F (36.4 C), temperature source Oral, resp. rate 16, SpO2 98%. There is no height or weight on file to calculate BMI.  Treatment Plan Summary:  Patient does believe that his inadequately treated depressive and anxiety symptoms are more pressing at this time.  Will discontinue Intuniv, only keeping Wellbutrin on board for ADHD symptoms, as patient has found some relief.  We will opt to start Cymbalta, as it will provide benefit for depression, anxiety, and neuropathy symptoms.  Patient is agreeable to a trial.  Daily contact with patient to assess and evaluate symptoms and progress in treatment and Medication management    ##  Psychiatric Medication Recommendations:  -Continue COWS protocol -Continue Wellbutrin 300 mg p.o.  daily -Trazodone 100 mg p.o. nightly as needed -Will discontinue guanfacine 1 mg in the morning and 2 mg at bedtime for ADHD, noted side effect of sedation for sleep onset. -We will start Cymbalta 20 mg daily tomorrow morning, as depression and anxiety most pressing symptoms.  Reviewed with patient need to commit to at least 6 hours of consolidated sleep a day.  He will choose to sleep between 2 AM and 8 AM.  He is aware that he will need to be in his room earlier while admitted to [facility]  -Agitation protocol medications and sleep aids in place.   ## Medical Decision Making Capacity: Not specifically addressed in this encounter   ## Disposition:--Will attempt to make sooner appointment with this writer (patient's outpatient psychiatrist) as well as his therapist, as patient declines substance use rehabilitation at this time.  Plan to discharge Monday.   ## Behavioral / Environmental: -Safety/agitation precautions  Lamar Sprinkles, MD 03/21/2023 9:40 AM

## 2023-03-21 NOTE — Group Note (Signed)
Group Topic: Relapse and Recovery  Group Date: 03/20/2023 Start Time: 2000 End Time: 2100 Facilitators: Debe Coder, NT  Department: [facility]  Number of Participants: 8  Group Focus: goals/reality orientation Treatment Modality:  Individual Therapy Interventions utilized were patient education Purpose: reinforce self-care  Name: Jeremiah Allen Date of Birth: 04-Mar-1985  MR: [id number]    Level of Participation: pt did not attend group Quality of Participation:  pt did not attend group Interactions with others:  pt did not attend group Mood/Affect:  Triggers (if applicable):  Cognition:  Progress:  Response:  Plan:   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   Cannabis abuse 10/29/2022   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/2022   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) 02/10/2022   Irritant contact dermatitis associated with fecal stoma 02/06/2022   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/2022   Rectal cancer (HCC) 01/15/2022   Genetic testing 12/05/2021   Rectal cancer metastasized to liver [facility])    Generalized abdominal pain    Lower GI bleeding    Rectal bleeding 04/16/2021   Nicotine dependence 04/16/2021   Sciatica 04/16/2021

## 2023-03-21 NOTE — Group Note (Signed)
Group Topic: Healthy Self Image and Positive Change  Group Date: 03/21/2023 Start Time: 1000 End Time: 1050 Facilitators: Ninfa Linden, NT +3 MHT 2 Department: [facility]  Number of Participants: 3  Group Focus: self-esteem Treatment Modality:  Patient-Centered Therapy Interventions utilized were clarification Purpose: enhance coping skills and regain self-worth  Name: Jeremiah Allen Date of Birth: 1985-10-29  MR: [id number]    Level of Participation: Patient did not attend Group Quality of Participation: N/A Interactions with others: N/A Mood/Affect: N/A Triggers (if applicable): N/A Cognition: N/A Progress: N/A Response: N/A Plan: N/A  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   Cannabis abuse 10/29/2022   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/2022   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   Small bowel obstruction (HCC) 03/07/2022   Ileostomy in place [facility]) 02/10/2022   Irritant contact dermatitis associated with fecal stoma 02/06/2022   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/2022   Rectal cancer (HCC) 01/15/2022   Genetic testing 12/05/2021   Rectal cancer metastasized to liver [facility])    Generalized abdominal pain    Lower GI bleeding    Rectal bleeding 04/16/2021   Nicotine dependence 04/16/2021   Sciatica 04/16/2021

## 2023-03-21 NOTE — ED Notes (Signed)
Assumed care of pt. This am, A&O x3, pleasant and cooperative no complaints , no SI, HI AVH.

## 2023-03-21 NOTE — ED Notes (Signed)
Patient was seen resting in bed with eyes closed. He is easily aroused and denies SIHI, AVH, and reports anxiety and depression 5. He is irritable and speaks loudly when responding to assessment questions. He reports sleeping most of the day and has a hard time sleeping at night and denies having night mares. He reports eating poorly with last BM on 03/19/23. Staff will continue to monitor safety and changes in condition.

## 2023-03-21 NOTE — ED Notes (Signed)
Discharge Note:  Patient denies SI/HI/AVH at this time. Discharge instructions, AVS, prescriptions, and transition record reviewed with patient. Patient agrees to comply with medication management, follow-up visit, and outpatient therapy. Patient belongings returned to patient. Patient questions and concerns addressed and answered. Patient ambulatory off unit @ 1425,  Patient discharged to self care, home with family

## 2023-03-21 NOTE — ED Notes (Signed)
Patient is resting in bed with eyes closed without any distress. No s/s of discomfort. Staff will continue to monitor safety and changes in condition.
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
## 2023-03-22 ENCOUNTER — Other Ambulatory Visit (HOSPITAL_BASED_OUTPATIENT_CLINIC_OR_DEPARTMENT_OTHER): Payer: Self-pay

## 2023-03-23 LAB — CULTURE, BLOOD (ROUTINE X 2)
Culture: NO GROWTH
Culture: NO GROWTH
Special Requests: ADEQUATE
Special Requests: ADEQUATE

## 2023-03-26 ENCOUNTER — Ambulatory Visit (INDEPENDENT_AMBULATORY_CARE_PROVIDER_SITE_OTHER): Payer: Medicaid Other | Admitting: Student

## 2023-03-26 ENCOUNTER — Encounter (HOSPITAL_COMMUNITY): Payer: Self-pay | Admitting: Student

## 2023-03-26 VITALS — BP 128/87 | HR 70 | Ht 69.0 in | Wt 143.2 lb

## 2023-03-26 DIAGNOSIS — F902 Attention-deficit hyperactivity disorder, combined type: Secondary | ICD-10-CM | POA: Diagnosis not present

## 2023-03-26 DIAGNOSIS — F4312 Post-traumatic stress disorder, chronic: Secondary | ICD-10-CM | POA: Diagnosis not present

## 2023-03-26 DIAGNOSIS — F1520 Other stimulant dependence, uncomplicated: Secondary | ICD-10-CM | POA: Diagnosis not present

## 2023-03-26 DIAGNOSIS — F1210 Cannabis abuse, uncomplicated: Secondary | ICD-10-CM | POA: Diagnosis not present

## 2023-03-26 IMAGING — CT CT CHEST-ABD-PELV W/ CM
2 of 4 series · 13 of 46 positions shown, 15 images · IV contrast (APPLIED)
Comparison: Multiple priors including CT chest April 18, 2021, MRI
abdomen and pelvis April 17, 2021 and CT abdomen and pelvis [REDACTED]

CLINICAL DATA: Rectal cancer metastatic, follow-up only on
chemotherapy. * Tracking Code: BO *

EXAM:
CT CHEST, ABDOMEN, AND PELVIS WITH CONTRAST
TECHNIQUE: Multidetector CT imaging of the chest, abdomen and pelvis was
performed following the standard protocol during bolus
administration of intravenous contrast.

[Series 2: cap with · axial · 0.89mm/px · z∈[+987,+1547]mm · 10 of 134 slices shown, 12 images]
[im 11/134  soft-tissue]
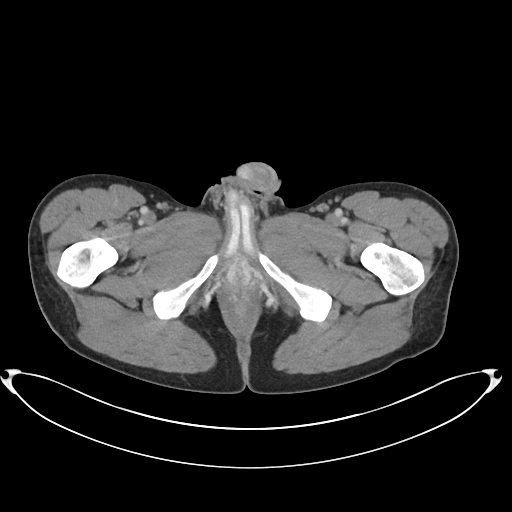
[im 11/134  bone]
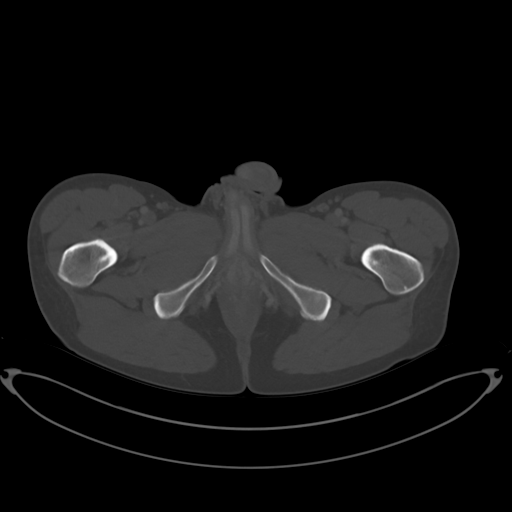
[im 21/134  soft-tissue]
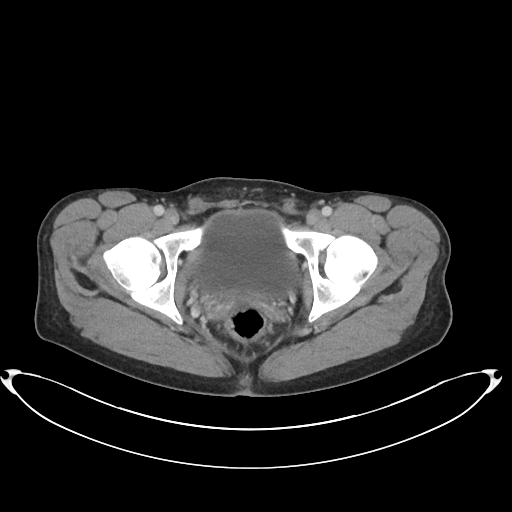
[im 41/134  soft-tissue]
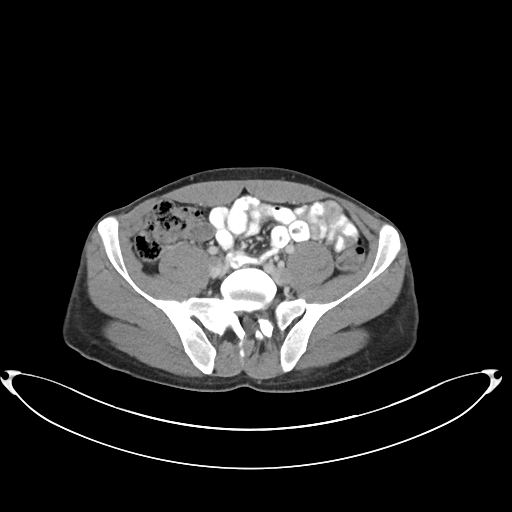
[im 52/134  soft-tissue]
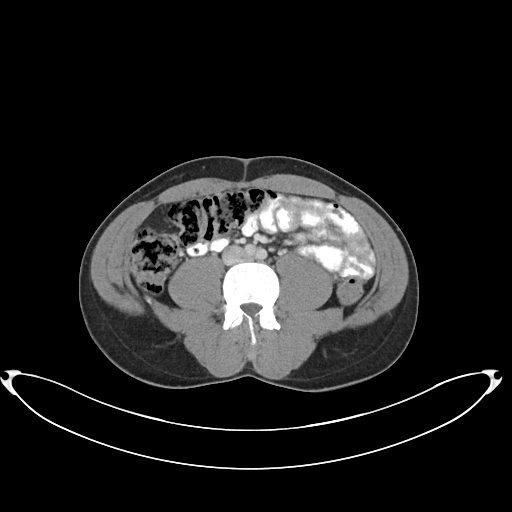
[im 62/134  soft-tissue]
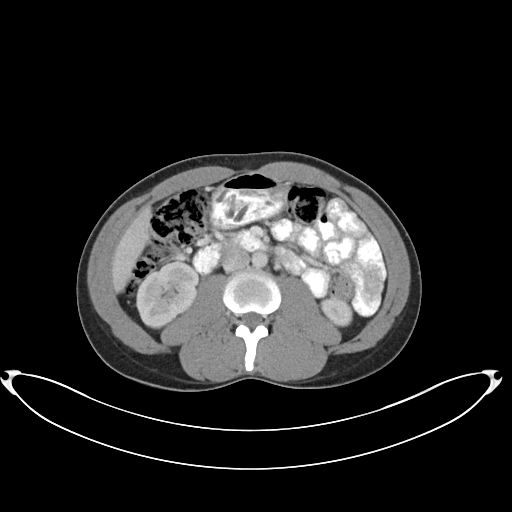
[im 72/134  soft-tissue]
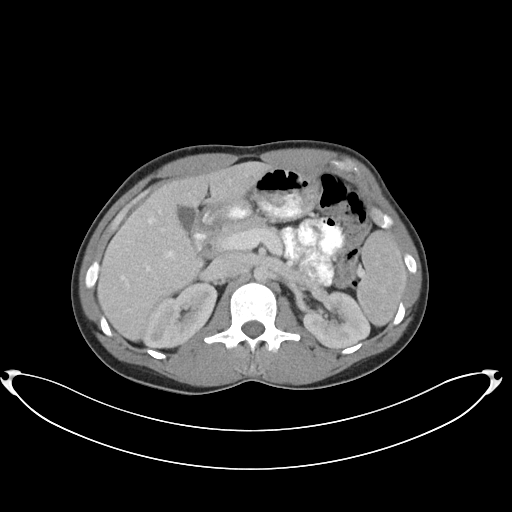
[im 82/134  soft-tissue]
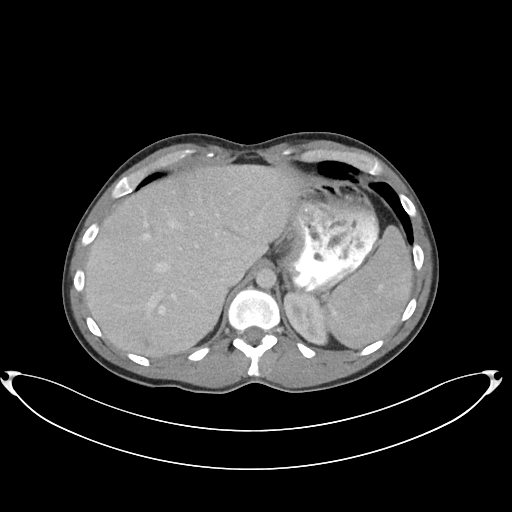
[im 103/134  soft-tissue]
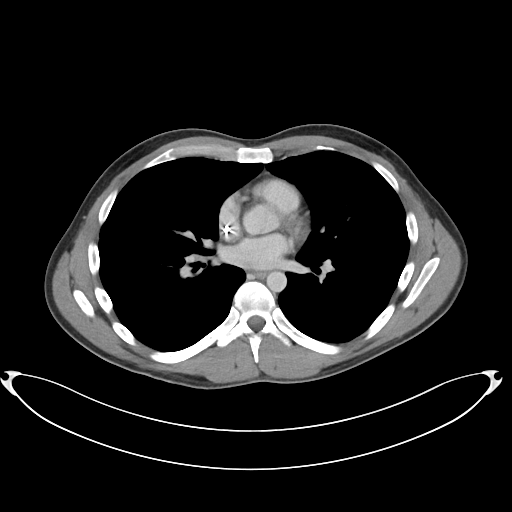
[im 113/134  soft-tissue]
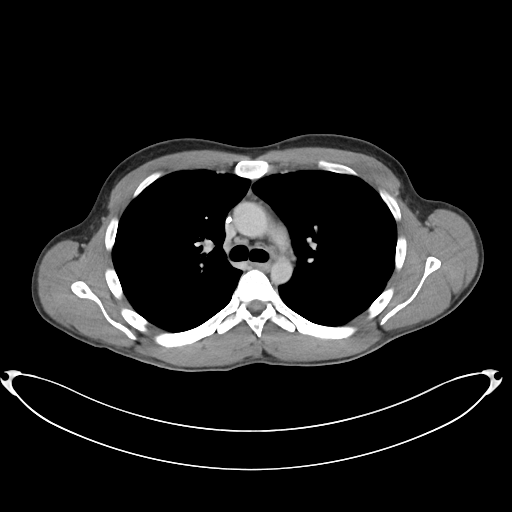
[im 113/134  bone]
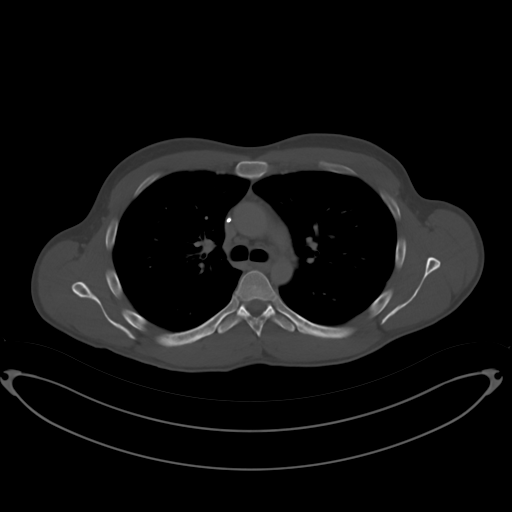
[im 123/134  soft-tissue]
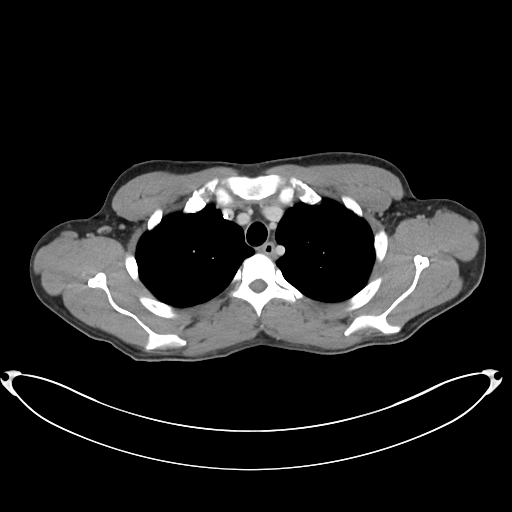

[Series 5: coronal · coronal · 0.79mm/px · 3 of 83 slices shown]
[im 28/83  soft-tissue]
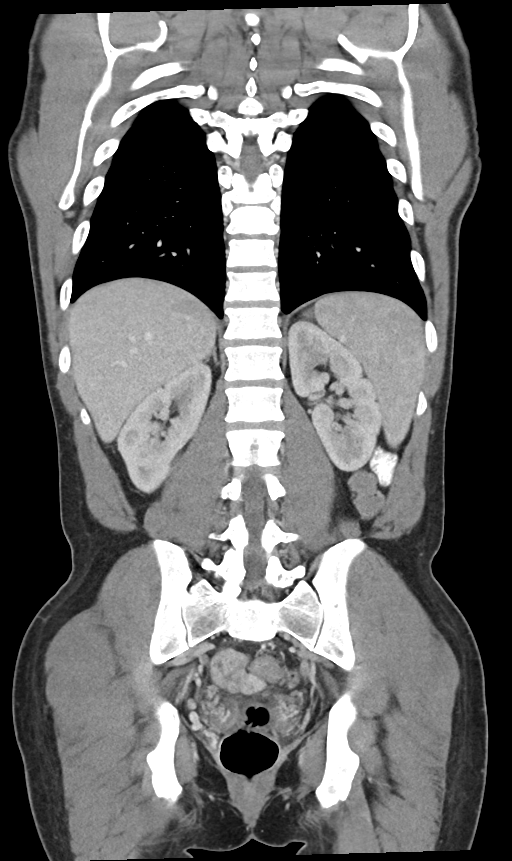
[im 37/83  soft-tissue]
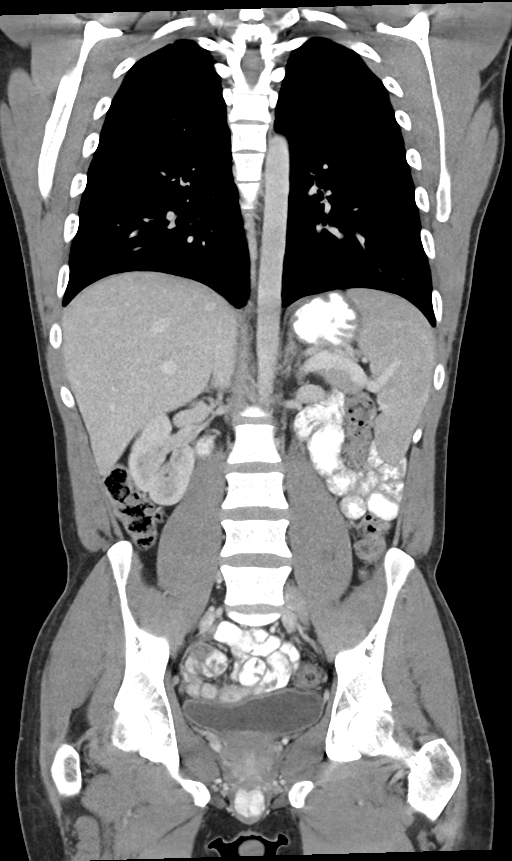
[im 46/83  soft-tissue]
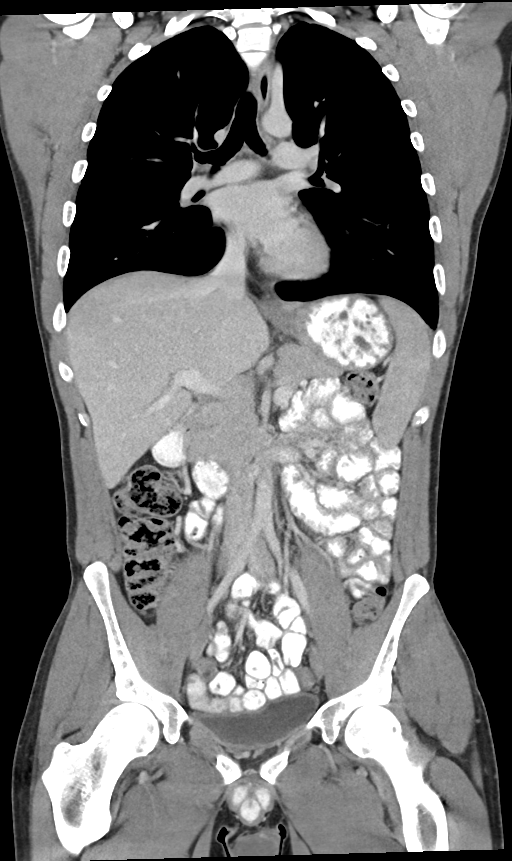

[13 of 46 positions shown; findings below may reference images not displayed]

RADIATION DOSE REDUCTION: This exam was performed according to the
departmental dose-optimization program which includes automated
exposure control, adjustment of the mA and/or kV according to
patient size and/or use of iterative reconstruction technique.

CONTRAST:  85mL OMNIPAQUE IOHEXOL 300 MG/ML  SOLN
FINDINGS: CT CHEST FINDINGS

Cardiovascular: Accessed right chest wall Port-A-Cath with tip near
the superior cavoatrial junction. Normal caliber thoracic aorta. No
central pulmonary embolus on this nondedicated study. Normal size
heart. No significant pericardial effusion/thickening.

Mediastinum/Nodes: No supraclavicular adenopathy. No suspicious
thyroid nodule. No pathologically enlarged mediastinal, hilar or
axillary lymph nodes.

Lungs/Pleura: Consolidation with air bronchograms in the lingula for
instance on image 125/4. Suspicious pulmonary nodules or masses. No
pleural effusion. No pneumothorax.

Musculoskeletal: No aggressive lytic or blastic lesion of bone.

CT ABDOMEN PELVIS FINDINGS

Hepatobiliary: Decreased size of the ill-defined hepatic lesions.
For reference:

CT April 16, 2021

-segment VII hepatic lesion measures 11 x 11 mm on image 52/2
previously 19 x 17 mm

-additional subtle lesion in the segment VI seen on prior MRI but
not well seen on prior CT and does not confidently identified on
today's examination.

Gallbladder is unremarkable.  No biliary ductal dilation.

Pancreas: No pancreatic ductal dilation or evidence of acute
inflammation.

Spleen: No splenomegaly or focal splenic lesion.

Adrenals/Urinary Tract: Bilateral adrenal glands appear normal. No
hydronephrosis. No renal, ureteral or bladder calculi. Urinary
bladder is unremarkable for degree of distension.

Stomach/Bowel: Radiopaque enteric contrast material traverses distal
loops of small bowel. The stomach is unremarkable for degree of
distension. No pathologic dilation of small or large bowel. The
appendix and terminal ileum appear normal. Irregular rectal wall
this examination but is overall decreased in comparison to prior CT.

Vascular/Lymphatic: Decreased size of the perirectal lymph nodes the
largest of which now measures 5 mm in short axis on image 116/2
previously 8 mm. No new sites of adenopathy in the abdomen or
pelvis.

Normal caliber abdominal aorta.

Reproductive: Prostate is unremarkable.

Other: No significant abdominopelvic free fluid.

Musculoskeletal: Scattered benign bone islands throughout the pelvic
girdle appears similar prior. No aggressive lytic or blastic lesion
of bone.
IMPRESSION: 1. Decreased irregular rectal wall thickening consistent with
treatment response.
2. Decreased size of the hepatic metastases in perirectal lymph
nodes, also consistent with treatment response.
3. No convincing evidence of new or progressive metastatic disease
in the chest, abdomen or pelvis.
4. Lingular consolidation with air bronchograms is most consistent
with an infectious or inflammatory etiology, consider short-term
interval follow-up chest CT to ensure resolution.

## 2023-03-26 NOTE — Progress Notes (Signed)
 [facility] MD Outpatient Progress Note  03/26/2023 9:57 AM  Jeremiah Allen  MRN:  [id number]  Assessment:  Brunetta Jeans Jacquin presents for follow-up evaluation in-person. Today, 03/26/2023, patient presents after discharge from [facility] 2/2 suicide attempt via overdose on methamphetamines.  Patient seen by this writer during his admission (see notes for further details).  Patient was advised during admission that he would have close follow-up within the next week or 2.  Patient has been readjusting to his life well.  He advises that he has been working on becoming less impulsive and better able to think before acting.  He denies safety concerns since returning home.  Extensive therapeutic discussion with patient regarding his will to live, his substance use, his childhood trauma affecting his adulthood.  Patient does report that he has since used marijuana, but denies further methamphetamine use.  He is amenable to options other than substance use, and plans to journal.  He is tolerating the Wellbutrin and Cymbalta well.  Advised that we will continue to titrate medications as needed.  He is also advised that with his relapse and continued marijuana use, we cannot consider stimulants.  Patient advised that he will maintain sobriety and have a UDS in approximately 6 weeks, which he can have obtained at any [facility]. he is reminded that there would be additional, random urine drug screens after that, offering proof that he is able to maintain his sobriety.  If he does use substances, we cannot safely prescribe controlled substances to him.  He voices understanding and is agreeable to this plan.  Patient reports that if he begins to experience suicidal ideation, he would call his ex-fianc, dial 16, present to the behavioral urgent care, or present to the nearest emergency department.  Identifying Information: Jeremiah Allen is a 38 y.o. male with a history of adjustment disorder, remote  methamphetamine use, in remission, and a medical history of stage IV colorectal cancer in remission  who is an established patient with [facility] for evaluation and management of ADHD.    Risk Assessment: An assessment of suicide and violence risk factors was performed as part of this evaluation and is not  significantly changed from the last visit.             While future psychiatric events cannot be accurately predicted, the patient does not currently require acute inpatient psychiatric care and does not currently meet [facility] involuntary commitment criteria.          Plan:  # ADHD, combined type Past medication trials:  Status of problem: Confirmed with diva assessment Interventions: -- Continue Wellbutrin XL 300 mg daily  # Chronic PTSD Past medication trials:  Status of problem:  Interventions: -- Continue Cymbalta 20 mg daily as prescribed during [facility] admission.  We will titrate as necessary   # Cannabis abuse #Methamphetamine use disorder Past medication trials:  Status of problem: Moderate Interventions: -- Counseled on complete cessation of use -- UDS to be obtained in 6 weeks, followed by random urine drug screens  Return to care on 3/19  Patient was given contact information for behavioral health clinic and was instructed to call 911 for emergencies.    Patient and plan of care will be discussed with the Attending MD ,Dr. Adrian Blackwater, who agrees with the above statement and plan.   Subjective:  Chief Complaint:  Chief Complaint  Patient presents with   Follow-up    Interval History: Patient reports things are better since  [facility] discharge. Stress is some decreased. He is able to "take a pause" before making rash decisions.  He did report right shoulder pain and SCM/trapezius muscles. He has to time his Cymbalta and Wellbutrin with Colestid, with inability to take them within 2 hours of the latter.  Stressors this week: [facility]  case manager has not been helpful in ensuring that he will maintain his disability.  Still using pre-rolled marijuana from the smoke shop 0.5 at a time. It slows him down physically. He believes that not smoking is what led to inability to control his anxiety leading to suicide attempt.   Therapeutic discussion is provided about using "Band-Aids" rather than addressing the root of the issue.  He is reminded that he is human and allowed to feel emotions.  The patient adamantly denies SI, passive and active, HI, and AVH.   Visit Diagnosis:    ICD-10-CM   1. Attention deficit hyperactivity disorder (ADHD), combined type  F90.2     2. Chronic post-traumatic stress disorder (PTSD)  F43.12     3. Cannabis abuse  F12.10     4. Methamphetamine use disorder, severe (HCC)  F15.20        Past Psychiatric History:  Diagnoses: bipolar disorder Medication trials: Abilify- took for 2 weeks. Previous psychiatrist/therapist: Current therapist: Richardson Dopp, LCSW Hospitalizations: Denies Suicide attempts: 1-2 attempts in 58s. Stabbed self in chest. In the middle of chemo, attempted to OD at 35. SIB: Denies Hx of violence towards others: Denies Current access to guns: Denies Hx of trauma/abuse: See HPI Seizure/Head trauma: 2 concussions from wrestling.  1/30: DIVA (Diagnostic Interview for ADHD in Adults) assessment. Results are available on chart, detailing that patient meets criteria for ADHD, combined type. 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  Of note, patient also did experience traumatic symptoms in childhood particularly surrounding waking up and the time in which he was expected to be ready for school.  While this certainly may play a role in patient's hyperactivity and need to always be productive, he does meet criteria for ADHD as well.  ADHD appears to be the better explanation of his current symptoms.   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    Past Surgical History:  Procedure Laterality Date   DIVERTING ILEOSTOMY N/A 01/15/2022   Procedure: DIVERTING ILEOSTOMY;  Surgeon: Romie Levee, MD;  Location: [facility];  Service: General;  Laterality: N/A;   FLEXIBLE SIGMOIDOSCOPY N/A 04/16/2021   Procedure: FLEXIBLE SIGMOIDOSCOPY;  Surgeon: Toledo, Boykin Nearing, MD;  Location: [facility] ENDOSCOPY;  Service: Gastroenterology;  Laterality: N/A;   ILEOSTOMY CLOSURE N/A 03/18/2022   Procedure: LOOP ILEOSTOMY REVERSAL;  Surgeon: Romie Levee, MD;  Location: [facility];  Service: General;  Laterality: N/A;   IR IMAGING GUIDED PORT INSERTION  04/28/2021   IR REMOVAL TUN ACCESS W/ PORT W/O FL MOD SED  01/13/2023   LAPAROSCOPY N/A 08/04/2021   Procedure: STAGING LAPAROSCOPY WITH INTRAOPERATIVE ULTRASOUND;  Surgeon: Fritzi Mandes, MD;  Location: [facility] OR;  Service: General;  Laterality: N/A;   OPEN PARTIAL HEPATECTOMY  N/A 08/04/2021   Procedure: OPEN PARTIAL HEPATECTOMY;  Surgeon: Fritzi Mandes, MD;  Location: [facility] OR;  Service:  General;  Laterality: N/A;   WISDOM TOOTH EXTRACTION     XI ROBOTIC ASSISTED LOWER ANTERIOR RESECTION N/A 01/15/2022   Procedure: XI ROBOTIC ASSISTED LOWER ANTERIOR RESECTION;  Surgeon: Romie Levee, MD;  Location: [facility];  Service: General;  Laterality: N/A;    Family Psychiatric History: ASD- brother   Substance use- mom Alcohol- Dad  Family History:  Family History  Problem Relation Age of Onset   Heart disease Mother    Miscarriages / Stillbirths Sister    Asthma Sister    Stroke Sister        at birth   Developmental delay Brother     Social History:  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  Lived in a "crack house" at 37.   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 Social  History   Socioeconomic History   Marital status: Single    Spouse name: Not on file   Number of children: Not on file   Years of education: Not on file   Highest education level: Not on file  Occupational History   Not on file  Tobacco Use   Smoking status: Every Day    Current packs/day: 0.25    Average packs/day: 0.3 packs/day for 10.0 years (2.5 ttl pk-yrs)    Types: Cigarettes    Passive exposure: Current   Smokeless tobacco: Never   Tobacco comments:    "If given proper anxiety medication I would"  Vaping Use   Vaping status: Never Used  Substance and Sexual Activity   Alcohol use: Not Currently    Comment: occ   Drug use: Yes    Frequency: 3.0 times per week    Types: Marijuana, Methamphetamines    Comment: 1/8 per day   Sexual activity: Not Currently    Partners: Female    Comment: fiance  Other Topics Concern   Not on file  Social History Narrative   [place]; works for hotel- at front desk-over night. <1/2 ppd; no alcohol. Fiance. No children; 2 cats and dog.    Social Drivers of Corporate investment banker Strain: Low Risk  (01/21/2023)   Received from [facility]   Overall Financial Resource Strain (CARDIA)    Difficulty of Paying Living Expenses: Not hard at all  Recent Concern: Financial Resource Strain - Medium Risk (10/29/2022)   Overall Financial Resource Strain (CARDIA)    Difficulty of Paying Living Expenses: Somewhat hard  Food Insecurity: Food Insecurity Present (03/19/2023)   Hunger Vital Sign    Worried About Running Out of Food in the Last Year: Never true    Ran Out of Food in the Last Year: Often true  Transportation Needs: No Transportation Needs (03/19/2023)   PRAPARE - Administrator, Civil Service (Medical): No    Lack of Transportation (Non-Medical): No  Physical Activity: Inactive (10/29/2022)   Exercise Vital Sign    Days of Exercise per Week: 0 days    Minutes of Exercise per Session: 0 min   Stress: Stress Concern Present (10/29/2022)   [employer] of Occupational Health - Occupational Stress Questionnaire    Feeling of Stress : Very much  Social Connections: Moderately Isolated (10/29/2022)   Social Connection and Isolation Panel [NHANES]    Frequency of Communication with Friends and Family: More than three times a week    Frequency of Social Gatherings with Friends and Family: More than three times a week    Attends Religious Services: Never  Active Member of Clubs or Organizations: No    Attends Banker Meetings: Never    Marital Status: Living with partner    Allergies:  Allergies  Allergen Reactions   Crab Extract Anaphylaxis and Swelling    Throat swelling  Reaction only to crab, no other shellfish   Dilaudid [Hydromorphone] Itching and Other (See Comments)    Skin tingling sensations    Seroquel [Quetiapine] Other (See Comments)    Skin tingling/crawling sensation    Current Medications: Current Outpatient Medications  Medication Sig Dispense Refill   acetaminophen (TYLENOL) 500 MG tablet Take 500 mg by mouth 2 (two) times daily as needed for moderate pain (pain score 4-6), headache or fever.     buPROPion (WELLBUTRIN XL) 300 MG 24 hr tablet Take 1 tablet (300 mg total) by mouth every morning. 30 tablet 1   colestipol (COLESTID) 1 g tablet Take 1 tablet (1 g total) by mouth 3 (three) times daily as needed (diarrhea/fecal urgency). Do not take within 2 hours of other medications. 90 tablet 3   DULoxetine (CYMBALTA) 20 MG capsule Take 1 capsule (20 mg total) by mouth daily. 30 capsule 0   nicotine (NICODERM CQ - DOSED IN MG/24 HOURS) 14 mg/24hr patch Place 1 patch (14 mg total) onto the skin daily. Remove just after supper. (Patient not taking: Reported on 03/29/2023) 28 patch 0   No current facility-administered medications for this visit.    ROS: Review of Systems   Objective:  Psychiatric Specialty Exam: Blood pressure 128/87, pulse  70, height 5\' 9"  (1.753 m), weight 143 lb 3.2 oz (65 kg), SpO2 98%.Body mass index is 21.15 kg/m.  General Appearance: Casual  Eye Contact:  Good  Speech:   Hyperverbal and fast pace of speech, but not pressured  Volume:  Normal  Mood:  Anxious and Irritable  Affect:  Appropriate and Congruent  Thought Content: Rumination   Suicidal Thoughts:  No  Homicidal Thoughts:  No  Thought Process:  Coherent and Goal Directed  Orientation:  Full (Time, Place, and Person)    Memory: Immediate;   Good Recent;   Good Remote;   Good  Judgment:  Fair  Insight:  Fair and Shallow  Concentration:  Concentration: Fair and Attention Span: Poor  Recall: not formally assessed   Fund of Knowledge: Good  Language: Good  Psychomotor Activity:  Increased  Akathisia:  No  AIMS (if indicated): not done  Assets:  Communication Skills Desire for Improvement Financial Resources/Insurance Leisure Time Resilience Social Support Talents/Skills Vocational/Educational  ADL's:  Intact  Cognition: WNL  Sleep:  Good   PE: General: well-appearing; no acute distress  Pulm: no increased work of breathing on room air  Strength & Muscle Tone: within normal limits Neuro: no focal neurological deficits observed Gait & Station: normal  Metabolic Disorder Labs: Lab Results  Component Value Date   HGBA1C 5.1 03/21/2023   MPG 99.67 03/21/2023   No results found for: "PROLACTIN" Lab Results  Component Value Date   CHOL 133 03/21/2023   TRIG 65 03/21/2023   HDL 50 03/21/2023   CHOLHDL 2.7 03/21/2023   VLDL 13 03/21/2023   LDLCALC 70 03/21/2023   No results found for: "TSH"  Therapeutic Level Labs: No results found for: "LITHIUM" No results found for: "VALPROATE" No results found for: "CBMZ"  Screenings: GAD-7    Flowsheet Row Counselor from 10/29/2022 in [facility]  Total GAD-7 Score 20      [phone]  Flowsheet Row ED from 03/19/2023 in [facility] Counselor from 10/29/2022 in [facility] Social Work from 04/29/2021 in [facility] [place]  PHQ-2 Total Score 2 4 2   PHQ-9 Total Score 8 21 6       Flowsheet Row ED from 03/19/2023 in [facility] ED from 03/18/2023 in [facility] at [facility] Counselor from 10/29/2022 in [facility]  C-SSRS RISK CATEGORY High Risk Error: Q2 is Yes, you must answer 3, 4, and 5 Low Risk       Collaboration of Care: Collaboration of Care: Dr. Woodroe Mode and Richardson Dopp, LCSW  Patient/Guardian was advised Release of Information must be obtained prior to any record release in order to collaborate their care with an outside provider. Patient/Guardian was advised if they have not already done so to contact the registration department to sign all necessary forms in order for [place] to release information regarding their care.   Consent: Patient/Guardian gives verbal consent for treatment and assignment of benefits for services provided during this visit. Patient/Guardian expressed understanding and agreed to proceed.   Lamar Sprinkles, MD 03/26/2023 9:57 AM

## 2023-03-29 ENCOUNTER — Ambulatory Visit (HOSPITAL_COMMUNITY): Payer: Medicaid Other | Admitting: Student

## 2023-03-31 ENCOUNTER — Other Ambulatory Visit (HOSPITAL_BASED_OUTPATIENT_CLINIC_OR_DEPARTMENT_OTHER): Payer: Self-pay

## 2023-03-31 MED ORDER — DIPHENOXYLATE-ATROPINE 2.5-0.025 MG PO TABS
1.0000 | ORAL_TABLET | Freq: Four times a day (QID) | ORAL | 1 refills | Status: DC | PRN
  Filled 2023-03-31: qty 30, 8d supply, fill #0
  Filled 2023-04-12: qty 30, 8d supply, fill #1

## 2023-04-05 ENCOUNTER — Ambulatory Visit (INDEPENDENT_AMBULATORY_CARE_PROVIDER_SITE_OTHER): Payer: Medicaid Other | Admitting: Licensed Clinical Social Worker

## 2023-04-05 DIAGNOSIS — F4325 Adjustment disorder with mixed disturbance of emotions and conduct: Secondary | ICD-10-CM

## 2023-04-05 DIAGNOSIS — F902 Attention-deficit hyperactivity disorder, combined type: Secondary | ICD-10-CM | POA: Diagnosis not present

## 2023-04-05 NOTE — Progress Notes (Signed)
 THERAPIST PROGRESS NOTE  Virtual Visit via Video Note  I connected with Jeremiah Allen on 04/05/23 at  3:00 PM EST by a video enabled telemedicine application and verified that I am speaking with the correct person using two identifiers.  Location: Patient: [facility]  Provider: Providers Home    I discussed the limitations of evaluation and management by telemedicine and the availability of in person appointments. The patient expressed understanding and agreed to proceed.    I discussed the assessment and treatment plan with the patient. The patient was provided an opportunity to ask questions and all were answered. The patient agreed with the plan and demonstrated an understanding of the instructions.   The patient was advised to call back or seek an in-person evaluation if the symptoms worsen or if the condition fails to improve as anticipated.  I provided 45 minutes of non-face-to-face time during this encounter.   Jeremiah Allen, Jeremiah Allen   Participation Level: Active  Behavioral Response: CasualAlertAnxious and Depressed  Type of Therapy: Individual Therapy  Treatment Goals addressed:  Active     Anxiety     LTG: Jeremiah Allen will score less than 5 on the Generalized Anxiety Disorder 7 Scale (GAD-7)      Start:  10/29/22    Expected End:  09/02/23         STG: Jeremiah Allen will participate in at least 80% of scheduled individual psychotherapy sessions  (Progressing)     Start:  10/29/22    Expected End:  09/02/23         STG: Jeremiah Allen will complete at least 80% of assigned homework  (Progressing)     Start:  10/29/22    Expected End:  09/02/23         STG: Jeremiah Allen will practice problem solving skills 3 times per week for the next 4 weeks.  (Progressing)     Start:  10/29/22    Expected End:  09/02/23         identify 3 triggers for anxiety  (Progressing)     Start:  10/29/22    Expected End:  09/02/23         Review results of GAD-7 with Jeremiah Allen to track  progress     Start:  10/29/22         Create a weekly activity schedule     Start:  10/29/22         Perform motivational interviewing regarding engagement and attendance with therapy     Start:  10/29/22         Perform motivational interviewing regarding completion of homework assignments     Start:  10/29/22         Perform motivational interviewing regarding use of tools     Start:  10/29/22         Perform motivational interviewing regarding physical activity     Start:  10/29/22         Perform motivational interviewing regarding medication adherence     Start:  10/29/22         Discuss risks and benefits of medication treatment options for this problem and prescribe as indicated (Completed)     Start:  10/29/22    End:  01/13/23      Encourage Jeremiah Allen to take psychotropic medication(s) as prescribed (Completed)     Start:  10/29/22    End:  04/05/23      Work with Jeremiah Allen to track symptoms, triggers, and/or skill use  through a mood chart, diary card, or journal (Completed)     Start:  10/29/22    End:  01/13/23      Perform psychoeducation regarding anxiety disorders (Completed)     Start:  10/29/22    End:  04/05/23    Intervention note from Counselor 01/13/2023 by Jeremiah Allen, Jeremiah Allen     Reviewed with patient during session.          Provide Jeremiah Allen with educational information and reading material on anxiety, its causes, and symptoms.  (Completed)     Start:  10/29/22    End:  01/13/23      Work with patient individually to identify the major components of a recent episode of anxiety: physical symptoms, major thoughts and images, and major behaviors they experienced (Completed)     Start:  10/29/22    End:  01/13/23      Work with Jeremiah Allen to identify 3 personal goals for managing their anxiety to work on during current treatment.  (Completed)     Start:  10/29/22    End:  01/13/23      Work with Jeremiah Allen to identify a minimum of 3  consequences of avoidance.  (Completed)     Start:  10/29/22    End:  04/05/23      Work with Jeremiah Allen to identify a minimum of 3 alternative coping behaviors to avoidance.  (Completed)     Start:  10/29/22    End:  04/05/23    Intervention Note     Reviewed with patient during session.            OP Depression     LTG: Jeremiah Allen will score less than 5 on the Patient Health Questionnaire (PHQ-9)      Start:  10/29/22    Expected End:  09/02/23         identify 3 trigger for depression  (Progressing)     Start:  10/29/22    Expected End:  09/02/23         identify 3 coping skills for depression  (Progressing)     Start:  10/29/22    Expected End:  09/02/23         Encourage Jeremiah Allen to participate in recovery peer support activities weekly      Start:  10/29/22         Therapist will administer the PHQ-9 at weekly intervals for the next 8 weeks     Start:  10/29/22         Work with Jeremiah Allen to track symptoms, triggers, and/or skill use through a mood chart, diary card, or journal (Completed)     Start:  10/29/22    End:  01/13/23      Provide Jeremiah Allen educational information and reading material on dissociation, its causes, and symptoms (Completed)     Start:  10/29/22    End:  01/13/23      Work with Jeremiah Allen to identify the major components of a recent episode of depression: physical symptoms, major thoughts and images, and major behaviors they experienced (Completed)     Start:  10/29/22    End:  04/05/23    Intervention Note     Reviewed with patient during session.             ProgressTowards Goals: Progressing  Interventions: CBT, Motivational Interviewing, and Supportive  Summary: Jeremiah Allen is a 38 y.o. male who presents with depressed and anxious mood\affect.  Patient was  pleasant, cooperative, maintained good eye contact.  He engaged well in therapy session was dressed casually.  Patient comes in today with primary stressors as "being a  perfectionist".  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  Jeremiah Allen reports that at times this may not be fair because people do not have his experience or knowledge.  Patient reports that this causes frustration within himself and can cause conflict with holding other people from the same standards.  Suicidal/Homicidal: Nowithout intent/plan  Therapist Response:     Intervention/Plan: Jeremiah Allen educated patient today on challenging irrational thoughts.  Jeremiah Allen provided patient worksheet for challenging irrational thoughts.  This is to help [place] decrease anxiety and decrease expectations of others.  Jeremiah Allen utilized supportive therapy for praise and encouragement.  Jeremiah Allen utilized cognitive behavioral therapy for challenging irrational thoughts and replacing them with rational thoughts.  Jeremiah Allen educated patient on setting realistic expectations.  Plan for patient is to utilize worksheet provided in today's session and bring it in next session to discuss.  Plan: Return again in 3 weeks.  Diagnosis: ADHD (attention deficit hyperactivity disorder), combined type  Adjustment disorder with mixed disturbance of emotions and conduct  Collaboration of Care: Other None today   Patient/Guardian was advised Release of Information must be obtained prior to any record release in order to collaborate their care with an outside provider. Patient/Guardian was advised if they have not already done so to contact the registration department to sign all necessary forms in order for [place] to release information regarding their care.   Consent: Patient/Guardian gives verbal consent for treatment and assignment of benefits for services provided during this visit. Patient/Guardian expressed understanding and agreed to proceed.   Jeremiah Allen, Jeremiah Allen 04/05/2023

## 2023-04-12 ENCOUNTER — Other Ambulatory Visit (HOSPITAL_COMMUNITY): Payer: Self-pay

## 2023-04-12 ENCOUNTER — Encounter (HOSPITAL_BASED_OUTPATIENT_CLINIC_OR_DEPARTMENT_OTHER): Payer: Self-pay

## 2023-04-12 ENCOUNTER — Other Ambulatory Visit (HOSPITAL_BASED_OUTPATIENT_CLINIC_OR_DEPARTMENT_OTHER): Payer: Self-pay

## 2023-04-12 ENCOUNTER — Other Ambulatory Visit: Payer: Self-pay

## 2023-04-16 ENCOUNTER — Other Ambulatory Visit (HOSPITAL_BASED_OUTPATIENT_CLINIC_OR_DEPARTMENT_OTHER): Payer: Self-pay

## 2023-04-17 ENCOUNTER — Other Ambulatory Visit (HOSPITAL_BASED_OUTPATIENT_CLINIC_OR_DEPARTMENT_OTHER): Payer: Self-pay

## 2023-04-19 ENCOUNTER — Other Ambulatory Visit (HOSPITAL_BASED_OUTPATIENT_CLINIC_OR_DEPARTMENT_OTHER): Payer: Self-pay

## 2023-04-19 ENCOUNTER — Other Ambulatory Visit: Payer: Self-pay

## 2023-04-19 ENCOUNTER — Telehealth (HOSPITAL_COMMUNITY): Payer: Self-pay

## 2023-04-19 MED ORDER — DIPHENOXYLATE-ATROPINE 2.5-0.025 MG PO TABS
1.0000 | ORAL_TABLET | Freq: Four times a day (QID) | ORAL | 1 refills | Status: DC | PRN
  Filled 2023-04-19: qty 30, 8d supply, fill #0
  Filled 2023-05-10: qty 30, 8d supply, fill #1

## 2023-04-19 NOTE — Telephone Encounter (Signed)
 Hello,   Pt/Pharmacy is asking for a refill of CYMBALTA  to be sent to pharmacy. Told pt to give Provider some time for review. Next APP 04/21/2023.

## 2023-04-20 ENCOUNTER — Other Ambulatory Visit (HOSPITAL_COMMUNITY): Payer: Self-pay | Admitting: Student

## 2023-04-20 ENCOUNTER — Other Ambulatory Visit (HOSPITAL_BASED_OUTPATIENT_CLINIC_OR_DEPARTMENT_OTHER): Payer: Self-pay

## 2023-04-20 MED ORDER — DULOXETINE HCL 20 MG PO CPEP
20.0000 mg | ORAL_CAPSULE | Freq: Every day | ORAL | 0 refills | Status: DC
  Filled 2023-04-20: qty 30, 30d supply, fill #0

## 2023-04-20 NOTE — Telephone Encounter (Signed)
 Done

## 2023-04-21 ENCOUNTER — Ambulatory Visit (INDEPENDENT_AMBULATORY_CARE_PROVIDER_SITE_OTHER): Payer: Medicaid Other | Admitting: Student

## 2023-04-21 VITALS — BP 123/75 | HR 73 | Ht 69.0 in | Wt 146.4 lb

## 2023-04-21 DIAGNOSIS — F1210 Cannabis abuse, uncomplicated: Secondary | ICD-10-CM | POA: Diagnosis not present

## 2023-04-21 DIAGNOSIS — F1521 Other stimulant dependence, in remission: Secondary | ICD-10-CM | POA: Diagnosis not present

## 2023-04-21 DIAGNOSIS — F4312 Post-traumatic stress disorder, chronic: Secondary | ICD-10-CM

## 2023-04-21 DIAGNOSIS — F902 Attention-deficit hyperactivity disorder, combined type: Secondary | ICD-10-CM | POA: Diagnosis not present

## 2023-04-21 NOTE — Progress Notes (Unsigned)
 [facility] MD Outpatient Progress Note  04/21/2023 3:32 PM  Jeremiah Allen  MRN:  [id number]  Assessment:  Jeremiah Allen presents for follow-up evaluation in-person. Today, 04/21/2023 , patient reports doing much better with current medication regimen.  He reports being less impulsive, less angry, and better able to speak up and advocate for himself.  As a result, he has seen improvements in his interactions with others, as well as in his career.  He has also been doing a lot of writing, putting down thoughts on paper rather than taking them out on himself, on items, or verbally on others.  Patient appears to be speaking slower and is not as restless.  Additionally, he arrived 2 minutes prior to appointment time instead of 30 minutes prior, and did not appear anxious in the waiting area.  He seems to be responding to the increase in norepinephrine from both medications, as well as the dopamine increase from Wellbutrin and serotonin increase from Cymbalta.  Advised that we will continue to titrate medications as needed. He does report that he has continued to use marijuana, but denies further methamphetamine use.  He is also advised that with his relapse and continued marijuana use, we cannot consider stimulants.  As methamphetamine acts on norepinephrine, dopamine, and serotonergic receptors that stimulants do not, it is likely that he would not experience the full benefit of stimulants.  Rather, his current medication regimen is acting on the receptors that we would like to target for both focus and mood/anxiety.  Believe that we could continue to maximize his current regimen as tolerated and improve anxiety and ADHD symptoms.    Patient denies safety concerns since last visit including thoughts of harm toward himself or others.  Patient again verbalizes a safety plan: If he begins to experience suicidal ideation, he would call his ex-fianc/roommate, dial 70, present to the behavioral urgent  care, or present to the nearest emergency department.  Identifying Information: Jeremiah Allen is a 38 y.o. male with a history of adjustment disorder, remote methamphetamine use, in remission, and a medical history of stage IV colorectal cancer in remission  who is an established patient with [facility] for evaluation and management of ADHD.    Risk Assessment: An assessment of suicide and violence risk factors was performed as part of this evaluation and is not  significantly changed from the last visit.             While future psychiatric events cannot be accurately predicted, the patient does not currently require acute inpatient psychiatric care and does not currently meet [facility] involuntary commitment criteria.          Plan:  # ADHD, combined type Past medication trials:  Status of problem: Confirmed with DIVA assessment Interventions: -- Continue Wellbutrin XL 300 mg daily  # Chronic PTSD Past medication trials:  Status of problem:  Interventions: -- Continue Cymbalta 20 mg daily as prescribed during [facility] admission.  We will titrate as necessary   # Cannabis abuse #Methamphetamine use disorder Past medication trials:  Status of problem: Moderate Interventions: -- Counseled on complete cessation of use -- UDS was scheduled for 6 weeks, but patient still actively smoking marijuana  Return to care in 4 to 6 weeks  Patient was given contact information for behavioral health clinic and was instructed to call 911 for emergencies.    Patient and plan of care will be discussed with the Attending MD ,Dr. Adrian Blackwater, who agrees with  the above statement and plan.   Subjective:  Chief Complaint:  Chief Complaint  Patient presents with   Follow-up   Medication Refill   Depression   Anxiety   ADHD    Interval History: Patient reports things are "much better" since last visit. He has been able to take a moment before increasing his anger.  He is able to "take a pause" before making rash decisions.  He had an increase in career opportunity, which has proven to   Denies concerns about mood. Waking up is easier.   Sleep is variable; broken, but getting an average of 6-7 hours of sleep.   Medication combination working well at current dosages. Able to control anger better. No longer having as many verbal outbursts. No longer having occurrences become catastrophic.  Still using pre-rolled marijuana from the smoke shop 1.5 per day, only on weekdays. Smoking cigarettes 0.5 ppd.   The patient adamantly denies SI, passive and active, HI, and AVH.   Visit Diagnosis:    ICD-10-CM   1. Attention deficit hyperactivity disorder (ADHD), combined type  F90.2     2. Chronic post-traumatic stress disorder (PTSD)  F43.12     3. Cannabis abuse  F12.10     4. Methamphetamine use disorder, severe, in early remission (HCC)  F15.21         Past Psychiatric History:  Diagnoses: bipolar disorder Medication trials: Abilify- took for 2 weeks. Previous psychiatrist/therapist: Current therapist: Richardson Dopp, LCSW Hospitalizations: Denies Suicide attempts: 1-2 attempts in 69s. Stabbed self in chest. In the middle of chemo, attempted to OD at 35. SIB: Denies Hx of violence towards others: Denies Current access to guns: Denies Hx of trauma/abuse: See HPI Seizure/Head trauma: 2 concussions from wrestling.  1/30: DIVA (Diagnostic Interview for ADHD in Adults) assessment. Results are available on chart, detailing that patient meets criteria for ADHD, combined type. 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  Of note, patient also did experience traumatic symptoms in childhood particularly surrounding waking up and the time in which he was expected to be ready for school.  While this certainly may play a role in patient's hyperactivity and need to always be productive, he does meet criteria  for ADHD as well.  ADHD appears to be the better explanation of his current symptoms.   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    Past Surgical History:  Procedure Laterality Date   DIVERTING ILEOSTOMY N/A 01/15/2022   Procedure: DIVERTING ILEOSTOMY;  Surgeon: Romie Levee, MD;  Location: [facility];  Service: General;  Laterality: N/A;   FLEXIBLE SIGMOIDOSCOPY N/A 04/16/2021   Procedure: FLEXIBLE SIGMOIDOSCOPY;  Surgeon: Toledo, Boykin Nearing, MD;  Location: [facility] ENDOSCOPY;  Service: Gastroenterology;  Laterality: N/A;   ILEOSTOMY CLOSURE N/A 03/18/2022   Procedure: LOOP ILEOSTOMY REVERSAL;  Surgeon: Romie Levee, MD;  Location: [facility];  Service: General;  Laterality: N/A;   IR IMAGING GUIDED PORT INSERTION  04/28/2021   IR REMOVAL TUN ACCESS W/ PORT W/O FL MOD SED  01/13/2023   LAPAROSCOPY N/A 08/04/2021   Procedure: STAGING LAPAROSCOPY WITH INTRAOPERATIVE ULTRASOUND;  Surgeon: Fritzi Mandes, MD;  Location: [facility] OR;  Service: General;  Laterality: N/A;  OPEN PARTIAL HEPATECTOMY  N/A 08/04/2021   Procedure: OPEN PARTIAL HEPATECTOMY;  Surgeon: Fritzi Mandes, MD;  Location: [facility] OR;  Service: General;  Laterality: N/A;   WISDOM TOOTH EXTRACTION     XI ROBOTIC ASSISTED LOWER ANTERIOR RESECTION N/A 01/15/2022   Procedure: XI ROBOTIC ASSISTED LOWER ANTERIOR RESECTION;  Surgeon: Romie Levee, MD;  Location: [facility];  Service: General;  Laterality: N/A;    Family Psychiatric History: ASD- brother   Substance use- mom Alcohol- Dad  Family History:  Family History  Problem Relation Age of Onset   Heart disease Mother    Miscarriages / Stillbirths Sister    Asthma Sister    Stroke Sister        at birth   Developmental delay Brother     Social History:  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  Lived in a "crack house" at 22.   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 Social History   Socioeconomic History   Marital status: Single    Spouse name: Not on file   Number of children: Not on file   Years of education: Not on file   Highest education level: Not on file  Occupational History   Not on file  Tobacco Use   Smoking status: Every Day    Current packs/day: 0.25    Average packs/day: 0.3 packs/day for 10.0 years (2.5 ttl pk-yrs)    Types: Cigarettes    Passive exposure: Current   Smokeless tobacco: Never   Tobacco comments:    "If given proper anxiety medication I would"  Vaping Use   Vaping status: Never Used  Substance and Sexual Activity   Alcohol use: Not Currently    Comment: occ   Drug use: Yes    Frequency: 3.0 times per week    Types: Marijuana, Methamphetamines    Comment: 1/8 per day   Sexual activity: Not Currently    Partners: Female    Comment: fiance  Other Topics Concern   Not on file  Social History Narrative   Lives in [place]; works for hotel- at front desk-over night. <1/2 ppd; no alcohol. Fiance. No children; 2 cats and dog.    Social Drivers of Corporate investment banker Strain: Low Risk  (01/21/2023)   Received from [facility]   Overall Financial Resource Strain (CARDIA)    Difficulty of Paying Living Expenses: Not hard at all  Recent Concern: Financial Resource Strain - Medium Risk (10/29/2022)   Overall Financial Resource Strain (CARDIA)    Difficulty of Paying Living Expenses: Somewhat hard  Food Insecurity: Food Insecurity Present (03/19/2023)   Hunger Vital Sign    Worried About Running Out of Food in the Last Year: Never true    Ran Out of Food in the Last Year: Often true  Transportation Needs: No Transportation Needs (03/19/2023)   PRAPARE - Administrator, Civil Service (Medical): No    Lack of Transportation (Non-Medical): No  Physical Activity: Inactive  (10/29/2022)   Exercise Vital Sign    Days of Exercise per Week: 0 days    Minutes of Exercise per Session: 0 min  Stress: Stress Concern Present (10/29/2022)   [employer] of Occupational Health - Occupational Stress Questionnaire    Feeling of Stress : Very much  Social Connections: Moderately Isolated (10/29/2022)   Social Connection and Isolation Panel [NHANES]    Frequency of Communication with Friends and Family: More than three times a week  Frequency of Social Gatherings with Friends and Family: More than three times a week    Attends Religious Services: Never    Database administrator or Organizations: No    Attends Banker Meetings: Never    Marital Status: Living with partner    Allergies:  Allergies  Allergen Reactions   Crab Extract Anaphylaxis and Swelling    Throat swelling  Reaction only to crab, no other shellfish   Dilaudid [Hydromorphone] Itching and Other (See Comments)    Skin tingling sensations    Seroquel [Quetiapine] Other (See Comments)    Skin tingling/crawling sensation    Current Medications: Current Outpatient Medications  Medication Sig Dispense Refill   buPROPion (WELLBUTRIN XL) 300 MG 24 hr tablet Take 1 tablet (300 mg total) by mouth every morning. 30 tablet 1   colestipol (COLESTID) 1 g tablet Take 1 tablet (1 g total) by mouth 3 (three) times daily as needed (diarrhea/fecal urgency). Do not take within 2 hours of other medications. 90 tablet 3   diphenoxylate-atropine (LOMOTIL) 2.5-0.025 MG tablet Take 1 tablet by mouth every 6 (six) hours as needed for diarrhea. 30 tablet 1   DULoxetine (CYMBALTA) 20 MG capsule Take 1 capsule (20 mg total) by mouth daily. 30 capsule 0   acetaminophen (TYLENOL) 500 MG tablet Take 500 mg by mouth 2 (two) times daily as needed for moderate pain (pain score 4-6), headache or fever.     nicotine (NICODERM CQ - DOSED IN MG/24 HOURS) 14 mg/24hr patch Place 1 patch (14 mg total) onto the skin daily.  Remove just after supper. (Patient not taking: Reported on 03/29/2023) 28 patch 0   No current facility-administered medications for this visit.    ROS: Review of Systems   Objective:  Psychiatric Specialty Exam: Blood pressure 123/75, pulse 73, height 5\' 9"  (1.753 m), weight 146 lb 6.4 oz (66.4 kg).Body mass index is 21.62 kg/m.  General Appearance: Casual  Eye Contact:  Good  Speech:   Less hyperverbal and less fast paced speech; still fast paced, but slower than previous  Volume:  Normal  Mood:  Euthymic  Affect:  Appropriate and Congruent  Thought Content: Rumination, but much less  Suicidal Thoughts:  No  Homicidal Thoughts:  No  Thought Process:  Coherent and Goal Directed  Orientation:  Full (Time, Place, and Person)    Memory: Immediate;   Good Recent;   Good Remote;   Good  Judgment:  Fair, improved  Insight:  Fair and Shallow, improved  Concentration:  Concentration: Fair and Attention Span: Poor  Recall: not formally assessed   Fund of Knowledge: Good  Language: Good  Psychomotor Activity:  Increased  Akathisia:  No  AIMS (if indicated): not done  Assets:  Communication Skills Desire for Improvement Financial Resources/Insurance Leisure Time Resilience Social Support Talents/Skills Vocational/Educational  ADL's:  Intact  Cognition: WNL  Sleep:  Good   PE: General: well-appearing; no acute distress  Pulm: no increased work of breathing on room air  Strength & Muscle Tone: within normal limits Neuro: no focal neurological deficits observed Gait & Station: normal  Metabolic Disorder Labs: Lab Results  Component Value Date   HGBA1C 5.1 03/21/2023   MPG 99.67 03/21/2023   No results found for: "PROLACTIN" Lab Results  Component Value Date   CHOL 133 03/21/2023   TRIG 65 03/21/2023   HDL 50 03/21/2023   CHOLHDL 2.7 03/21/2023   VLDL 13 03/21/2023   LDLCALC 70 03/21/2023   No  results found for: "TSH"  Therapeutic Level Labs: No results  found for: "LITHIUM" No results found for: "VALPROATE" No results found for: "CBMZ"  Screenings: GAD-7    Flowsheet Row Counselor from 10/29/2022 in [facility]  Total GAD-7 Score 20      PHQ2-9    Flowsheet Row ED from 03/19/2023 in [facility] Counselor from 10/29/2022 in [facility] Social Work from 04/29/2021 in [facility]  PHQ-2 Total Score 2 4 2   PHQ-9 Total Score 8 21 6       Flowsheet Row ED from 03/19/2023 in [facility] ED from 03/18/2023 in [facility] Emergency Department at [facility] Counselor from 10/29/2022 in [facility]  C-SSRS RISK CATEGORY High Risk Error: Q2 is Yes, you must answer 3, 4, and 5 Low Risk       Collaboration of Care: Collaboration of Care: Dr. Adrian Blackwater and Richardson Dopp, LCSW  Patient/Guardian was advised Release of Information must be obtained prior to any record release in order to collaborate their care with an outside provider. Patient/Guardian was advised if they have not already done so to contact the registration department to sign all necessary forms in order for [place] to release information regarding their care.   Consent: Patient/Guardian gives verbal consent for treatment and assignment of benefits for services provided during this visit. Patient/Guardian expressed understanding and agreed to proceed.   Lamar Sprinkles, MD 04/21/2023 3:32 PM

## 2023-04-22 ENCOUNTER — Other Ambulatory Visit (HOSPITAL_BASED_OUTPATIENT_CLINIC_OR_DEPARTMENT_OTHER): Payer: Self-pay

## 2023-04-22 DIAGNOSIS — F4312 Post-traumatic stress disorder, chronic: Secondary | ICD-10-CM | POA: Insufficient documentation

## 2023-04-22 MED ORDER — BUPROPION HCL ER (XL) 300 MG PO TB24
300.0000 mg | ORAL_TABLET | ORAL | 1 refills | Status: DC
  Filled 2023-05-17: qty 30, 30d supply, fill #0
  Filled 2023-06-16: qty 30, 30d supply, fill #1

## 2023-04-22 MED ORDER — DULOXETINE HCL 20 MG PO CPEP
20.0000 mg | ORAL_CAPSULE | Freq: Every day | ORAL | 1 refills | Status: DC
  Filled 2023-05-17: qty 30, 30d supply, fill #0
  Filled 2023-06-16: qty 30, 30d supply, fill #1

## 2023-04-26 ENCOUNTER — Ambulatory Visit (HOSPITAL_COMMUNITY): Payer: Medicaid Other | Admitting: Licensed Clinical Social Worker

## 2023-04-26 ENCOUNTER — Encounter (HOSPITAL_COMMUNITY): Payer: Self-pay

## 2023-04-29 DIAGNOSIS — Z85048 Personal history of other malignant neoplasm of rectum, rectosigmoid junction, and anus: Secondary | ICD-10-CM | POA: Diagnosis not present

## 2023-04-29 DIAGNOSIS — F1911 Other psychoactive substance abuse, in remission: Secondary | ICD-10-CM | POA: Diagnosis not present

## 2023-04-29 DIAGNOSIS — C787 Secondary malignant neoplasm of liver and intrahepatic bile duct: Secondary | ICD-10-CM | POA: Diagnosis not present

## 2023-04-29 DIAGNOSIS — R159 Full incontinence of feces: Secondary | ICD-10-CM | POA: Diagnosis not present

## 2023-04-29 DIAGNOSIS — C20 Malignant neoplasm of rectum: Secondary | ICD-10-CM | POA: Diagnosis not present

## 2023-04-29 DIAGNOSIS — K591 Functional diarrhea: Secondary | ICD-10-CM | POA: Diagnosis not present

## 2023-05-10 ENCOUNTER — Other Ambulatory Visit: Payer: Self-pay

## 2023-05-17 ENCOUNTER — Other Ambulatory Visit (HOSPITAL_BASED_OUTPATIENT_CLINIC_OR_DEPARTMENT_OTHER): Payer: Self-pay

## 2023-05-17 ENCOUNTER — Ambulatory Visit (INDEPENDENT_AMBULATORY_CARE_PROVIDER_SITE_OTHER): Admitting: Licensed Clinical Social Worker

## 2023-05-17 DIAGNOSIS — F1220 Cannabis dependence, uncomplicated: Secondary | ICD-10-CM | POA: Diagnosis not present

## 2023-05-17 DIAGNOSIS — F4325 Adjustment disorder with mixed disturbance of emotions and conduct: Secondary | ICD-10-CM | POA: Diagnosis not present

## 2023-05-17 DIAGNOSIS — F902 Attention-deficit hyperactivity disorder, combined type: Secondary | ICD-10-CM | POA: Diagnosis not present

## 2023-05-17 NOTE — Progress Notes (Unsigned)
 THERAPIST PROGRESS NOTE  Virtual Visit via Video Note  I connected with Jeremiah Allen on 05/17/23 at  4:00 PM EDT by a video enabled telemedicine application and verified that I am speaking with the correct person using two identifiers.  Location: Patient: [facility]  Provider: Providers Home    I discussed the limitations of evaluation and management by telemedicine and the availability of in person appointments. The patient expressed understanding and agreed to proceed.     I discussed the assessment and treatment plan with the patient. The patient was provided an opportunity to ask questions and all were answered. The patient agreed with the plan and demonstrated an understanding of the instructions.   The patient was advised to call back or seek an in-person evaluation if the symptoms worsen or if the condition fails to improve as anticipated.  I provided 40 minutes of non-face-to-face time during this encounter.   [name], LCSW   Participation Level: Active  Behavioral Response: CasualAlertAnxious and Depressed  Type of Therapy: Individual Therapy  Treatment Goals addressed:  Active     Anxiety     LTG: Jeremiah Allen will score less than 5 on the Generalized Anxiety Disorder 7 Scale (GAD-7)      Start:  10/29/22    Expected End:  09/02/23         STG: Jeremiah Allen will participate in at least 80% of scheduled individual psychotherapy sessions  (Progressing)     Start:  10/29/22    Expected End:  09/02/23         STG: Jeremiah Allen will complete at least 80% of assigned homework  (Progressing)     Start:  10/29/22    Expected End:  09/02/23         STG: Jeremiah Allen will practice problem solving skills 3 times per week for the next 4 weeks.  (Progressing)     Start:  10/29/22    Expected End:  09/02/23         identify 3 triggers for anxiety  (Progressing)     Start:  10/29/22    Expected End:  09/02/23         Review results of GAD-7 with Jeremiah Allen to  track progress     Start:  10/29/22         Create a weekly activity schedule     Start:  10/29/22         Perform motivational interviewing regarding engagement and attendance with therapy     Start:  10/29/22         Perform motivational interviewing regarding completion of homework assignments     Start:  10/29/22         Perform motivational interviewing regarding use of tools     Start:  10/29/22         Perform motivational interviewing regarding physical activity     Start:  10/29/22         Perform motivational interviewing regarding medication adherence     Start:  10/29/22         Discuss risks and benefits of medication treatment options for this problem and prescribe as indicated (Completed)     Start:  10/29/22    End:  01/13/23      Encourage Jeremiah Allen to take psychotropic medication(s) as prescribed (Completed)     Start:  10/29/22    End:  04/05/23      Work with Jeremiah Allen to track symptoms, triggers, and/or skill  use through a mood chart, diary card, or journal (Completed)     Start:  10/29/22    End:  01/13/23      Perform psychoeducation regarding anxiety disorders (Completed)     Start:  10/29/22    End:  04/05/23    Intervention note from Counselor 01/13/2023 by Somaly Marteney S, LCSW     Reviewed with patient during session.          Provide Jeremiah Allen with educational information and reading material on anxiety, its causes, and symptoms.  (Completed)     Start:  10/29/22    End:  01/13/23      Work with patient individually to identify the major components of a recent episode of anxiety: physical symptoms, major thoughts and images, and major behaviors they experienced (Completed)     Start:  10/29/22    End:  01/13/23      Work with Jeremiah Allen to identify 3 personal goals for managing their anxiety to work on during current treatment.  (Completed)     Start:  10/29/22    End:  01/13/23      Work with Jeremiah Allen to identify a minimum  of 3 consequences of avoidance.  (Completed)     Start:  10/29/22    End:  04/05/23      Work with Jeremiah Allen to identify a minimum of 3 alternative coping behaviors to avoidance.  (Completed)     Start:  10/29/22    End:  04/05/23    Intervention Note     Reviewed with patient during session.            OP Depression     LTG: Jeremiah Allen will score less than 5 on the Patient Health Questionnaire (PHQ-9)      Start:  10/29/22    Expected End:  09/02/23         identify 3 trigger for depression  (Progressing)     Start:  10/29/22    Expected End:  09/02/23         identify 3 coping skills for depression  (Progressing)     Start:  10/29/22    Expected End:  09/02/23         Encourage Jeremiah Allen to participate in recovery peer support activities weekly      Start:  10/29/22         Therapist will administer the PHQ-9 at weekly intervals for the next 8 weeks     Start:  10/29/22         Work with Jeremiah Allen to track symptoms, triggers, and/or skill use through a mood chart, diary card, or journal (Completed)     Start:  10/29/22    End:  01/13/23      Provide Jeremiah Allen educational information and reading material on dissociation, its causes, and symptoms (Completed)     Start:  10/29/22    End:  01/13/23      Work with Jeremiah Allen to identify the major components of a recent episode of depression: physical symptoms, major thoughts and images, and major behaviors they experienced (Completed)     Start:  10/29/22    End:  04/05/23    Intervention Note     Reviewed with patient during session.             ProgressTowards Goals: Progressing  Interventions: CBT, Motivational Interviewing, and Supportive   Suicidal/Homicidal: Nowithout intent/plan  Therapist Response:   Jeremiah Allen was alert and oriented x 5.  He  was pleasant, cooperative, maintained good eye contact.  He engaged well in therapy session was dressed casually.  Patient presented today with depressed  mood\affect.  Patient reports today primary stressor as depression.  Jeremiah Allen states today that his irritability and anger outbursts have decreased.  However, patient reports that he has been struggling with increased depression.  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  Patient reports that he has high expectations no matter what platform he is referring even if it is a lower platform than the one he did before.  Jeremiah Allen reports the problem with that is that the productions are not always run as smoothly.  Patient reports that he often shuts down and only wants to lay in his bed as a coping skill or goes to the extreme of "white and thick cancer just finished me off".    LCSW spoke with patient today about suicidal or homicidal ideations and patient denied both.  He reports at times he just feels like he should not be here but does not want to harm himself.  LCSW safety plan with patient and spoke with him about resources to behavioral health urgent care at [address], [place], [place] and suicide prevention hotline.  Patient was agreeable to utilize those resources if plan or intent of suicidal ideations were present.  Intervention/plan: LCSW utilized psychoanalytic therapy for patient to express thoughts, feelings and emotions and nonjudgmental environment.  LCSW utilize cognitive behavioral therapy for cognitive restructuring.  LCSW utilized strength-based therapy to review his goals.  LCSW utilized person Center therapy for empowerment.  LCSW validated patient's feelings in session.  LCSW utilized reframing to help patient restructure cognitive thoughts process.    Plan: Return again in 3 weeks.  Diagnosis: Cannabis use disorder, severe, dependence (HCC)  ADHD (attention deficit hyperactivity disorder), combined type  Adjustment disorder with mixed disturbance of emotions and conduct  Collaboration of Care: Other None  today   Patient/Guardian was advised Release of Information must be obtained prior to any record release in order to collaborate their care with an outside provider. Patient/Guardian was advised if they have not already done so to contact the registration department to sign all necessary forms in order for us  to release information regarding their care.   Consent: Patient/Guardian gives verbal consent for treatment and assignment of benefits for services provided during this visit. Patient/Guardian expressed understanding and agreed to proceed.   Maryagnes Small, LCSW 05/17/2023

## 2023-05-18 ENCOUNTER — Other Ambulatory Visit: Payer: Self-pay

## 2023-05-20 ENCOUNTER — Other Ambulatory Visit (HOSPITAL_BASED_OUTPATIENT_CLINIC_OR_DEPARTMENT_OTHER): Payer: Self-pay

## 2023-05-20 MED ORDER — DIPHENOXYLATE-ATROPINE 2.5-0.025 MG PO TABS
1.0000 | ORAL_TABLET | Freq: Four times a day (QID) | ORAL | 1 refills | Status: DC | PRN
Start: 2023-05-20 — End: 2023-07-07
  Filled 2023-05-20: qty 30, 8d supply, fill #0
  Filled 2023-06-16: qty 30, 8d supply, fill #1

## 2023-06-03 ENCOUNTER — Encounter (HOSPITAL_COMMUNITY): Payer: Self-pay | Admitting: Student

## 2023-06-03 ENCOUNTER — Ambulatory Visit (INDEPENDENT_AMBULATORY_CARE_PROVIDER_SITE_OTHER): Payer: MEDICAID | Admitting: Student

## 2023-06-03 VITALS — BP 133/87 | HR 60 | Ht 69.0 in | Wt 144.8 lb

## 2023-06-03 DIAGNOSIS — F4325 Adjustment disorder with mixed disturbance of emotions and conduct: Secondary | ICD-10-CM

## 2023-06-03 DIAGNOSIS — F1220 Cannabis dependence, uncomplicated: Secondary | ICD-10-CM

## 2023-06-03 DIAGNOSIS — F4312 Post-traumatic stress disorder, chronic: Secondary | ICD-10-CM | POA: Diagnosis not present

## 2023-06-03 DIAGNOSIS — F902 Attention-deficit hyperactivity disorder, combined type: Secondary | ICD-10-CM | POA: Diagnosis not present

## 2023-06-03 DIAGNOSIS — F1521 Other stimulant dependence, in remission: Secondary | ICD-10-CM

## 2023-06-03 NOTE — Progress Notes (Signed)
 BH MD Outpatient Progress Note  06/03/2023  2:32 PM  Jeremiah Allen  MRN:  [id number]  Assessment:  Jeremiah Allen presents for follow-up evaluation in-person. Today, 06/03/2023 , patient reports doing much better with current medication regimen.  He again reports being less impulsive, less angry, and better able to speak up and advocate for himself.  As a result, he has seen improvements in his interactions with others, as well as in his career.  He has had moments of anger, which he puts into writing rather than taking them out on himself, on items, or verbally on others.  Patient objectively appears to be speaking slower and is not as restless.  Additionally, he arrived right at appointment time instead of 30 minutes prior, and did not appear anxious in the waiting area. He was sitting in a different seat than usual and was not overwhelmed by this. He continues to respond well to the increase in norepinephrine from both medications, as well as the dopamine increase from Wellbutrin  and serotonin increase from Cymbalta .  We will make no medication changes today.   He does report that he has continued to use marijuana, but denies further methamphetamine use.  As methamphetamine acts on norepinephrine, dopamine, and serotonergic receptors that stimulants do not, it is likely that he would not experience the full benefit of stimulants.  Rather, his current medication regimen is acting on the receptors that we would like to target for both focus and mood/anxiety.  Believe that we could continue to maximize his current regimen as tolerated and improve anxiety and ADHD symptoms.  Will not consider stimulants, and patient is aware of this.   Patient denies safety concerns since last visit including thoughts of harm toward himself or others.  Identifying Information: Jeremiah Allen is a 38 y.o. male with a history of adjustment disorder, remote methamphetamine use, in remission, and a  medical history of stage IV colorectal cancer in remission  who is an established patient with [facility] for evaluation and management of ADHD and anxiety.    Risk Assessment: An assessment of suicide and violence risk factors was performed as part of this evaluation and is not  significantly changed from the last visit.             While future psychiatric events cannot be accurately predicted, the patient does not currently require acute inpatient psychiatric care and does not currently meet [facility]  involuntary commitment criteria.          Plan:  # ADHD, combined type Past medication trials:  Status of problem: Confirmed with DIVA assessment Interventions: -- Continue Wellbutrin  XL 300 mg daily  # Chronic PTSD Past medication trials:  Status of problem:  Interventions: -- Continue Cymbalta  20 mg daily for trauma related anxiety.    # Cannabis abuse #Methamphetamine use disorder; in early remission Past medication trials:  Status of problem: Moderate Interventions: -- Counseled on complete cessation of use  Return to care in 4 to 6 weeks  Patient was given contact information for behavioral health clinic and was instructed to call 911 for emergencies.    Patient and plan of care will be discussed with the Attending MD ,Dr. Loletta Ripple, who agrees with the above statement and plan.   Subjective:  Chief Complaint:  No chief complaint on file.   Interval History: Patient reports things remain "better" overall since last visit. However, his emotions are more dulled. He no longer has a peak, where his  emotions are heightened, whereas he did not have that. He is taking naps.   He now has a car. His anger is more controlled. He is processing things well, but worried that his emotions are not experienced, due to previous feelings of "not looking sick."  He is aware of his surroundings, and able to get out of situations. He is able to see things from  different perspectives.   He has had 4-5 instances where his mood was so low, that he did not act on them, but he wrote about them. Found benefit in them. He does have outbursts where he says things that are harmful.   Taking Lomotil  for diarrhea/fecal incontinence. Going to [facility] for follow-up.   Denies concerns about mood. Waking up is easier.   Sleep is variable; broken, but getting an average of 6-7 hours of sleep.   Medication combination working well at current dosages. Able to control anger better. No longer having as many verbal outbursts. No longer having occurrences become catastrophic.  Still using pre-rolled marijuana from the smoke shop 1.5 per day, only on weekdays. Smoking cigarettes 0.5 ppd, if that quantity. Denies use of illicit drugs.   The patient adamantly denies SI, passive and active, HI, and AVH.   Visit Diagnosis:  No diagnosis found.     Past Psychiatric History:  Diagnoses: bipolar disorder Medication trials: Abilify - took for 2 weeks. Previous psychiatrist/therapist: Current therapist: Buell Carmin, LCSW Hospitalizations: Denies Suicide attempts: 1-2 attempts in 56s. Stabbed self in chest. In the middle of chemo, attempted to OD at 35. SIB: Denies Hx of violence towards others: Denies Current access to guns: Denies Hx of trauma/abuse: See HPI Seizure/Head trauma: 2 concussions from wrestling.  1/30: DIVA (Diagnostic Interview for ADHD in Adults) assessment. Results are available on chart, detailing that patient meets criteria for ADHD, combined type. 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  Of note, patient also did experience traumatic symptoms in childhood particularly surrounding waking up and the time in which he was expected to be ready for school.  While this certainly may play a role in patient's hyperactivity and need to always be productive, he does meet criteria for ADHD as well.  ADHD  appears to be the better explanation of his current symptoms.   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    Past Surgical History:  Procedure Laterality Date   DIVERTING ILEOSTOMY N/A 01/15/2022   Procedure: DIVERTING ILEOSTOMY;  Surgeon: Joyce Nixon, MD;  Location: [facility];  Service: General;  Laterality: N/A;   FLEXIBLE SIGMOIDOSCOPY N/A 04/16/2021   Procedure: FLEXIBLE SIGMOIDOSCOPY;  Surgeon: Toledo, Alphonsus Jeans, MD;  Location: [facility] ENDOSCOPY;  Service: Gastroenterology;  Laterality: N/A;   ILEOSTOMY CLOSURE N/A 03/18/2022   Procedure: LOOP ILEOSTOMY REVERSAL;  Surgeon: Joyce Nixon, MD;  Location: [facility];  Service: General;  Laterality: N/A;   IR IMAGING GUIDED PORT INSERTION  04/28/2021   IR REMOVAL TUN ACCESS W/ PORT W/O FL MOD SED  01/13/2023   LAPAROSCOPY N/A 08/04/2021   Procedure: STAGING LAPAROSCOPY WITH INTRAOPERATIVE ULTRASOUND;  Surgeon: Lujean Sake, MD;  Location: [facility] OR;  Service: General;  Laterality: N/A;   OPEN PARTIAL HEPATECTOMY  N/A  08/04/2021   Procedure: OPEN PARTIAL HEPATECTOMY;  Surgeon: Lujean Sake, MD;  Location: [facility] OR;  Service: General;  Laterality: N/A;   WISDOM TOOTH EXTRACTION     XI ROBOTIC ASSISTED LOWER ANTERIOR RESECTION N/A 01/15/2022   Procedure: XI ROBOTIC ASSISTED LOWER ANTERIOR RESECTION;  Surgeon: Joyce Nixon, MD;  Location: [facility];  Service: General;  Laterality: N/A;    Family Psychiatric History: ASD- brother   Substance use- mom Alcohol- Dad  Family History:  Family History  Problem Relation Age of Onset   Heart disease Mother    Miscarriages / Stillbirths Sister    Asthma Sister    Stroke Sister        at birth   Developmental delay Brother     Social History:  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  Lived  in a "crack house" at 30.   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 Social History   Socioeconomic History   Marital status: Single    Spouse name: Not on file   Number of children: Not on file   Years of education: Not on file   Highest education level: Not on file  Occupational History   Not on file  Tobacco Use   Smoking status: Every Day    Current packs/day: 0.25    Average packs/day: 0.3 packs/day for 10.0 years (2.5 ttl pk-yrs)    Types: Cigarettes    Passive exposure: Current   Smokeless tobacco: Never   Tobacco comments:    "If given proper anxiety medication I would"  Vaping Use   Vaping status: Never Used  Substance and Sexual Activity   Alcohol use: Not Currently    Comment: occ   Drug use: Yes    Frequency: 3.0 times per week    Types: Marijuana, Methamphetamines    Comment: 1/8 per day   Sexual activity: Not Currently    Partners: Female    Comment: fiance  Other Topics Concern   Not on file  Social History Narrative   Lives in [place]; works for hotel- at front desk-over night. <1/2 ppd; no alcohol. Fiance. No children; 2 cats and dog.    Social Drivers of Corporate investment banker Strain: Low Risk  (01/21/2023)   Received from [facility]   Overall Financial Resource Strain (CARDIA)    Difficulty of Paying Living Expenses: Not hard at all  Recent Concern: Financial Resource Strain - Medium Risk (10/29/2022)   Overall Financial Resource Strain (CARDIA)    Difficulty of Paying Living Expenses: Somewhat hard  Food Insecurity: Food Insecurity Present (03/19/2023)   Hunger Vital Sign    Worried About Running Out of Food in the Last Year: Never true    Ran Out of Food in the Last Year: Often true  Transportation Needs: No Transportation Needs (03/19/2023)   PRAPARE - Administrator, Civil Service (Medical): No    Lack of Transportation (Non-Medical): No  Physical Activity: Inactive (10/29/2022)   Exercise Vital Sign     Days of Exercise per Week: 0 days    Minutes of Exercise per Session: 0 min  Stress: Stress Concern Present (10/29/2022)   [employer] of Occupational Health - Occupational Stress Questionnaire    Feeling of Stress : Very much  Social Connections: Moderately Isolated (10/29/2022)   Social Connection and Isolation Panel [NHANES]    Frequency of Communication with Friends and Family: More than three times a week    Frequency of Social Gatherings with  Friends and Family: More than three times a week    Attends Religious Services: Never    Database administrator or Organizations: No    Attends Banker Meetings: Never    Marital Status: Living with partner    Allergies:  Allergies  Allergen Reactions   Crab Extract Anaphylaxis and Swelling    Throat swelling  Reaction only to crab, no other shellfish   Dilaudid  [Hydromorphone ] Itching and Other (See Comments)    Skin tingling sensations    Seroquel [Quetiapine] Other (See Comments)    Skin tingling/crawling sensation    Current Medications: Current Outpatient Medications  Medication Sig Dispense Refill   acetaminophen  (TYLENOL ) 500 MG tablet Take 500 mg by mouth 2 (two) times daily as needed for moderate pain (pain score 4-6), headache or fever.     buPROPion  (WELLBUTRIN  XL) 300 MG 24 hr tablet Take 1 tablet (300 mg total) by mouth every morning. 30 tablet 1   colestipol  (COLESTID ) 1 g tablet Take 1 tablet (1 g total) by mouth 3 (three) times daily as needed (diarrhea/fecal urgency). Do not take within 2 hours of other medications. 90 tablet 3   diphenoxylate -atropine  (LOMOTIL ) 2.5-0.025 MG tablet Take 1 tablet by mouth every 6 (six) hours as needed for diarrhea 30 tablet 1   DULoxetine  (CYMBALTA ) 20 MG capsule Take 1 capsule (20 mg total) by mouth daily. 30 capsule 1   No current facility-administered medications for this visit.    ROS: Review of Systems  Constitutional:  Negative for diaphoresis, fatigue and  unexpected weight change.  Gastrointestinal:  Positive for diarrhea.       Chronic  Genitourinary: Negative.   Musculoskeletal:  Negative for arthralgias.  Neurological:  Negative for dizziness, light-headedness and headaches.     Objective:  Psychiatric Specialty Exam: There were no vitals taken for this visit.There is no height or weight on file to calculate BMI.  General Appearance: Casual  Eye Contact:  Good  Speech:   Less hyperverbal and less fast paced speech; more normal  Volume:  Normal  Mood:  Euthymic  Affect:  Appropriate and Congruent  Thought Content: WDL and Logical  Suicidal Thoughts:  No  Homicidal Thoughts:  No  Thought Process:  Coherent and Goal Directed  Orientation:  Full (Time, Place, and Person)    Memory: Immediate;   Good Recent;   Good Remote;   Good  Judgment:  Fair, improved  Insight:  Fair and Shallow, improved  Concentration:  Concentration: Good and Attention Span: Fair  Recall: not formally assessed   Fund of Knowledge: Good  Language: Good  Psychomotor Activity:  Normal  Akathisia:  No  AIMS (if indicated): not done  Assets:  Communication Skills Desire for Improvement Financial Resources/Insurance Leisure Time Resilience Social Support Talents/Skills Vocational/Educational  ADL's:  Intact  Cognition: WNL  Sleep:  Good   PE: General: well-appearing; no acute distress  Pulm: no increased work of breathing on room air  Strength & Muscle Tone: within normal limits Neuro: no focal neurological deficits observed Gait & Station: normal  Metabolic Disorder Labs: Lab Results  Component Value Date   HGBA1C 5.1 03/21/2023   MPG 99.67 03/21/2023   No results found for: "PROLACTIN" Lab Results  Component Value Date   CHOL 133 03/21/2023   TRIG 65 03/21/2023   HDL 50 03/21/2023   CHOLHDL 2.7 03/21/2023   VLDL 13 03/21/2023   LDLCALC 70 03/21/2023   No results found for: "TSH"  Therapeutic  Level Labs: No results found for:  "LITHIUM" No results found for: "VALPROATE" No results found for: "CBMZ"  Screenings: GAD-7    Flowsheet Row Counselor from 10/29/2022 in [facility]  Total GAD-7 Score 20      PHQ2-9    Flowsheet Row ED from 03/19/2023 in [facility] Counselor from 10/29/2022 in [facility] Social Work from 04/29/2021 in [facility]  PHQ-2 Total Score 2 4 2   PHQ-9 Total Score 8 21 6       Flowsheet Row ED from 03/19/2023 in [facility] ED from 03/18/2023 in [facility] Counselor from 10/29/2022 in [facility]  C-SSRS RISK CATEGORY High Risk Error: Q2 is Yes, you must answer 3, 4, and 5 Low Risk       Collaboration of Care: Collaboration of Care: Dr. Eligio Grumbling and Buell Carmin, LCSW  Patient/Guardian was advised Release of Information must be obtained prior to any record release in order to collaborate their care with an outside provider. Patient/Guardian was advised if they have not already done so to contact the registration department to sign all necessary forms in order for us  to release information regarding their care.   Consent: Patient/Guardian gives verbal consent for treatment and assignment of benefits for services provided during this visit. Patient/Guardian expressed understanding and agreed to proceed.   Shery Done, MD 06/03/2023 2:32 PM

## 2023-06-14 ENCOUNTER — Ambulatory Visit (INDEPENDENT_AMBULATORY_CARE_PROVIDER_SITE_OTHER): Payer: MEDICAID | Admitting: Licensed Clinical Social Worker

## 2023-06-14 DIAGNOSIS — F4312 Post-traumatic stress disorder, chronic: Secondary | ICD-10-CM

## 2023-06-14 DIAGNOSIS — F4325 Adjustment disorder with mixed disturbance of emotions and conduct: Secondary | ICD-10-CM

## 2023-06-14 DIAGNOSIS — F902 Attention-deficit hyperactivity disorder, combined type: Secondary | ICD-10-CM | POA: Diagnosis not present

## 2023-06-14 NOTE — Progress Notes (Signed)
 THERAPIST PROGRESS NOTE   Virtual Visit via Video Note  I connected with Jeremiah Allen on 06/14/23 at  1:00 PM EDT by a video enabled telemedicine application and verified that I am speaking with the correct person using two identifiers.  Location: Patient: [facility]  Provider: Providers Home    I discussed the limitations of evaluation and management by telemedicine and the availability of in person appointments. The patient expressed understanding and agreed to proceed.   I discussed the assessment and treatment plan with the patient. The patient was provided an opportunity to ask questions and all were answered. The patient agreed with the plan and demonstrated an understanding of the instructions.   The patient was advised to call back or seek an in-person evaluation if the symptoms worsen or if the condition fails to improve as anticipated.  I provided 55 minutes of non-face-to-face time during this encounter.   Maryagnes Small, LCSW   Participation Level: Active  Behavioral Response: CasualAlertAngry and Anxious  Type of Therapy: Individual Therapy  Treatment Goals addressed:  Active     Anxiety     LTG: Jeremiah Allen will score less than 5 on the Generalized Anxiety Disorder 7 Scale (GAD-7)  (Progressing)     Start:  10/29/22    Expected End:  09/02/23         STG: Jeremiah Allen will participate in at least 80% of scheduled individual psychotherapy sessions  (Progressing)     Start:  10/29/22    Expected End:  09/02/23         STG: Jeremiah Allen will complete at least 80% of assigned homework  (Progressing)     Start:  10/29/22    Expected End:  09/02/23         STG: Jeremiah Allen will practice problem solving skills 3 times per week for the next 4 weeks.  (Progressing)     Start:  10/29/22    Expected End:  09/02/23         identify 3 triggers for anxiety  (Progressing)     Start:  10/29/22    Expected End:  09/02/23         Review results of GAD-7 with  Jeremiah Allen to track progress     Start:  10/29/22         Discuss risks and benefits of medication treatment options for this problem and prescribe as indicated (Completed)     Start:  10/29/22    End:  01/13/23      Encourage Jeremiah Allen to take psychotropic medication(s) as prescribed (Completed)     Start:  10/29/22    End:  04/05/23      Work with Jeremiah Allen to track symptoms, triggers, and/or skill use through a mood chart, diary card, or journal (Completed)     Start:  10/29/22    End:  01/13/23      Perform psychoeducation regarding anxiety disorders (Completed)     Start:  10/29/22    End:  04/05/23    Intervention note from Counselor 01/13/2023 by Maryagnes Small, LCSW     Reviewed with patient during session.          Provide Jeremiah Allen with educational information and reading material on anxiety, its causes, and symptoms.  (Completed)     Start:  10/29/22    End:  01/13/23      Work with patient individually to identify the major components of a recent episode of anxiety: physical symptoms, major thoughts  and images, and major behaviors they experienced (Completed)     Start:  10/29/22    End:  01/13/23      Work with Jeremiah Allen to identify 3 personal goals for managing their anxiety to work on during current treatment.  (Completed)     Start:  10/29/22    End:  01/13/23      Work with Jeremiah Allen to identify a minimum of 3 consequences of avoidance.  (Completed)     Start:  10/29/22    End:  04/05/23      Work with Jeremiah Allen to identify a minimum of 3 alternative coping behaviors to avoidance.  (Completed)     Start:  10/29/22    End:  04/05/23    Intervention Note     Reviewed with patient during session.          Create a weekly activity schedule (Completed)     Start:  10/29/22    End:  06/14/23      Perform motivational interviewing regarding engagement and attendance with therapy (Completed)     Start:  10/29/22    End:  06/14/23    Intervention Note      Reviewed with patient during session.          Perform motivational interviewing regarding completion of homework assignments (Completed)     Start:  10/29/22    End:  06/14/23    Intervention Note     Reviewed with patient during session.          Perform motivational interviewing regarding use of tools (Completed)     Start:  10/29/22    End:  06/14/23    Intervention Note     Reviewed with patient during session.          Perform motivational interviewing regarding physical activity (Completed)     Start:  10/29/22    End:  06/14/23    Intervention Note     Reviewed with patient during session.          Perform motivational interviewing regarding medication adherence (Completed)     Start:  10/29/22    End:  06/14/23    Intervention Note     Reviewed with patient during session.            OP Depression     LTG: Jeremiah Allen will score less than 5 on the Patient Health Questionnaire (PHQ-9)      Start:  10/29/22    Expected End:  09/02/23         identify 3 trigger for depression  (Progressing)     Start:  10/29/22    Expected End:  09/02/23         identify 3 coping skills for depression  (Progressing)     Start:  10/29/22    Expected End:  09/02/23         Encourage Jeremiah Allen to participate in recovery peer support activities weekly      Start:  10/29/22         Therapist will administer the PHQ-9 at weekly intervals for the next 8 weeks     Start:  10/29/22         Work with Jeremiah Allen to track symptoms, triggers, and/or skill use through a mood chart, diary card, or journal (Completed)     Start:  10/29/22    End:  01/13/23      Provide Jeremiah Allen educational information and reading material on dissociation, its causes, and  symptoms (Completed)     Start:  10/29/22    End:  01/13/23      Work with Jeremiah Allen to identify the major components of a recent episode of depression: physical symptoms, major thoughts and images, and major behaviors they  experienced (Completed)     Start:  10/29/22    End:  04/05/23    Intervention Note     Reviewed with patient during session.             ProgressTowards Goals: Progressing  Interventions: CBT and Motivational Interviewing  Suicidal/Homicidal: Nowithout intent/plan  Therapist Response:      Jeremiah Allen was alert and oriented x 5.  He was pleasant, cooperative, maintained good eye contact.  Jeremiah Allen engaged well in therapy session and was dressed casually.  He presented today with anxious, irritable, depressed mood\affect.  Patient reports primary stressors as communication.  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  Jeremiah Allen utilized the example being in a Surveyor, quantity of 1 another referee who is also in the production decided to wear shorts in the production versus pants.  Jeremiah Allen states that this is a big "no, no" in the wrestling world.  He reports that it looks unprofessional and tacky.  He reports that he expressed his displeasure in the meeting while raising his voice and then proceeded to walk out to smoke a cigarette.  Patient reports looking back, it was a poor look on him as he was just trying to give honest feedback for the production to look better.  Patient reports that he would like to improve on his communication skills to express thoughts and feelings more appropriately.  Intervention/plan: LCSW utilized psychoanalytic therapy for patient to express thoughts and feelings in session and nonjudgmental environment.  LCSW educated patient on communication techniques such as tone of voice and utilization of the "sandwich technique".  Sandwich technique was explained to Jeremiah Allen as utilization of a positive feedback followed by negative feedback followed again by positive feedback to help decrease the overall negative, and its meaning.  LCSW utilized cognitive behavioral therapy for cognitive restructuring and  reframing.  LCSW utilized supportive therapy for praise and encouragement.  LCSW educated patient on "stop, think, and listen technique". Plan: Return again in 4 weeks.  Diagnosis: ADHD (attention deficit hyperactivity disorder), combined type  Chronic post-traumatic stress disorder (PTSD)  Adjustment disorder with mixed disturbance of emotions and conduct  Collaboration of Care: Other None today   Patient/Guardian was advised Release of Information must be obtained prior to any record release in order to collaborate their care with an outside provider. Patient/Guardian was advised if they have not already done so to contact the registration department to sign all necessary forms in order for us  to release information regarding their care.   Consent: Patient/Guardian gives verbal consent for treatment and assignment of benefits for services provided during this visit. Patient/Guardian expressed understanding and agreed to proceed.   Maryagnes Small, LCSW 06/14/2023

## 2023-06-17 ENCOUNTER — Other Ambulatory Visit (HOSPITAL_BASED_OUTPATIENT_CLINIC_OR_DEPARTMENT_OTHER): Payer: Self-pay

## 2023-06-29 ENCOUNTER — Inpatient Hospital Stay: Payer: MEDICAID

## 2023-06-29 ENCOUNTER — Other Ambulatory Visit: Payer: Self-pay | Admitting: *Deleted

## 2023-06-29 ENCOUNTER — Ambulatory Visit (HOSPITAL_BASED_OUTPATIENT_CLINIC_OR_DEPARTMENT_OTHER): Payer: MEDICAID

## 2023-06-29 DIAGNOSIS — C20 Malignant neoplasm of rectum: Secondary | ICD-10-CM

## 2023-07-02 ENCOUNTER — Inpatient Hospital Stay: Payer: MEDICAID | Attending: Oncology

## 2023-07-02 ENCOUNTER — Ambulatory Visit (HOSPITAL_BASED_OUTPATIENT_CLINIC_OR_DEPARTMENT_OTHER)
Admission: RE | Admit: 2023-07-02 | Discharge: 2023-07-02 | Disposition: A | Discharge status: Home / Self Care | Payer: MEDICAID | Source: Ambulatory Visit | Attending: Oncology | Admitting: Oncology

## 2023-07-02 ENCOUNTER — Other Ambulatory Visit (HOSPITAL_BASED_OUTPATIENT_CLINIC_OR_DEPARTMENT_OTHER): Payer: Self-pay

## 2023-07-02 ENCOUNTER — Encounter: Payer: Self-pay | Admitting: *Deleted

## 2023-07-02 DIAGNOSIS — C20 Malignant neoplasm of rectum: Secondary | ICD-10-CM | POA: Insufficient documentation

## 2023-07-02 DIAGNOSIS — C787 Secondary malignant neoplasm of liver and intrahepatic bile duct: Secondary | ICD-10-CM | POA: Diagnosis present

## 2023-07-02 MED ORDER — IOHEXOL 300 MG/ML  SOLN
75.0000 mL | Freq: Once | INTRAMUSCULAR | Status: AC | PRN
  Administered 2023-07-02: 75 mL via INTRAVENOUS

## 2023-07-02 NOTE — Progress Notes (Signed)
 Call from radiology that [facility] just arrived in radiation and is drinking contrast. Was supposed to have lab prior to scan today. Asking if they can do an I-stat to check creatinine instead. Will reschedule his labs.

## 2023-07-05 ENCOUNTER — Encounter (HOSPITAL_COMMUNITY): Payer: Self-pay

## 2023-07-05 ENCOUNTER — Ambulatory Visit (HOSPITAL_COMMUNITY): Payer: MEDICAID | Admitting: Licensed Clinical Social Worker

## 2023-07-06 ENCOUNTER — Other Ambulatory Visit (HOSPITAL_BASED_OUTPATIENT_CLINIC_OR_DEPARTMENT_OTHER): Payer: Self-pay

## 2023-07-06 ENCOUNTER — Other Ambulatory Visit: Payer: Medicaid Other | Admitting: Oncology

## 2023-07-06 ENCOUNTER — Encounter (HOSPITAL_BASED_OUTPATIENT_CLINIC_OR_DEPARTMENT_OTHER): Payer: Self-pay

## 2023-07-07 ENCOUNTER — Other Ambulatory Visit (HOSPITAL_BASED_OUTPATIENT_CLINIC_OR_DEPARTMENT_OTHER): Payer: Self-pay

## 2023-07-07 MED ORDER — DIPHENOXYLATE-ATROPINE 2.5-0.025 MG PO TABS
1.0000 | ORAL_TABLET | Freq: Four times a day (QID) | ORAL | 1 refills | Status: DC | PRN
  Filled 2023-07-07: qty 30, 7d supply, fill #0
  Filled 2023-07-20: qty 30, 7d supply, fill #1

## 2023-07-11 ENCOUNTER — Other Ambulatory Visit (HOSPITAL_COMMUNITY): Payer: Self-pay

## 2023-07-14 ENCOUNTER — Other Ambulatory Visit: Payer: MEDICAID

## 2023-07-14 ENCOUNTER — Other Ambulatory Visit: Payer: Medicaid Other | Admitting: Oncology

## 2023-07-15 ENCOUNTER — Encounter (HOSPITAL_COMMUNITY): Payer: MEDICAID | Admitting: Student

## 2023-07-19 ENCOUNTER — Other Ambulatory Visit (HOSPITAL_COMMUNITY): Payer: Self-pay | Admitting: Student

## 2023-07-19 ENCOUNTER — Telehealth (HOSPITAL_COMMUNITY): Payer: Self-pay

## 2023-07-19 DIAGNOSIS — F902 Attention-deficit hyperactivity disorder, combined type: Secondary | ICD-10-CM

## 2023-07-19 MED ORDER — BUPROPION HCL ER (XL) 300 MG PO TB24
300.0000 mg | ORAL_TABLET | ORAL | 1 refills | Status: DC
  Filled 2023-07-19: qty 30, 30d supply, fill #0

## 2023-07-19 MED ORDER — DULOXETINE HCL 20 MG PO CPEP
20.0000 mg | ORAL_CAPSULE | Freq: Every day | ORAL | 1 refills | Status: DC
  Filled 2023-07-19: qty 30, 30d supply, fill #0

## 2023-07-19 NOTE — Telephone Encounter (Signed)
 Refills sent in.  Dr. Lorna Rose

## 2023-07-19 NOTE — Telephone Encounter (Signed)
 Documented in a sep call.    JNL

## 2023-07-19 NOTE — Telephone Encounter (Signed)
 Pt called and states that he missed his appointment past visit, but has one scheduled with you this upcoming 19th and that a perceptions was never sent in upping his Wellbutrin  and Cymbalta . Pt wants to know if he can get these refilled ? Please let me know how you would wish to proceed.

## 2023-07-20 ENCOUNTER — Other Ambulatory Visit: Payer: Self-pay

## 2023-07-20 ENCOUNTER — Other Ambulatory Visit (HOSPITAL_BASED_OUTPATIENT_CLINIC_OR_DEPARTMENT_OTHER): Payer: Self-pay

## 2023-07-20 NOTE — Telephone Encounter (Signed)
 Pt was notified. Left HIPAA COMPLIANT voicemail.  JNL

## 2023-07-22 ENCOUNTER — Ambulatory Visit (INDEPENDENT_AMBULATORY_CARE_PROVIDER_SITE_OTHER): Payer: MEDICAID | Admitting: Student

## 2023-07-22 ENCOUNTER — Other Ambulatory Visit (HOSPITAL_BASED_OUTPATIENT_CLINIC_OR_DEPARTMENT_OTHER): Payer: Self-pay

## 2023-07-22 DIAGNOSIS — F1220 Cannabis dependence, uncomplicated: Secondary | ICD-10-CM | POA: Diagnosis not present

## 2023-07-22 DIAGNOSIS — F902 Attention-deficit hyperactivity disorder, combined type: Secondary | ICD-10-CM | POA: Diagnosis not present

## 2023-07-22 DIAGNOSIS — F1521 Other stimulant dependence, in remission: Secondary | ICD-10-CM | POA: Diagnosis not present

## 2023-07-22 DIAGNOSIS — F4312 Post-traumatic stress disorder, chronic: Secondary | ICD-10-CM | POA: Diagnosis not present

## 2023-07-22 MED ORDER — DULOXETINE HCL 40 MG PO CPEP
40.0000 mg | ORAL_CAPSULE | Freq: Every day | ORAL | 1 refills | Status: DC
  Filled 2023-07-22 – 2023-08-05 (×2): qty 30, 30d supply, fill #0

## 2023-07-22 NOTE — Progress Notes (Unsigned)
 BH MD Outpatient Progress Note  07/22/2023 4:09 PM  Jeremiah Allen  MRN:  [id number]  Assessment:  Jeremiah Allen presents for follow-up evaluation in-person. Today, patient reports doing much better with current medication regimen.  He again reports being less impulsive, less angry, and better able to speak up and advocate for himself.  As a result, he has seen improvements in his interactions with others, as well as in his career.  He has had moments of anger, which he puts into writing rather than taking them out on himself, on items, or verbally on others.  Patient objectively appears to be speaking slower and is not as restless.  Additionally, he arrived right at appointment time instead of 30 minutes prior, and did not appear anxious in the waiting area. He was sitting in a different seat than usual and was not overwhelmed by this. He continues to respond well to the increase in norepinephrine from both medications, as well as the dopamine increase from Wellbutrin  and serotonin increase from Cymbalta .  We will make no medication changes today.   He does report that he has continued to use marijuana, but denies further methamphetamine use.  As methamphetamine acts on norepinephrine, dopamine, and serotonergic receptors that stimulants do not, it is likely that he would not experience the full benefit of stimulants.  Rather, his current medication regimen is acting on the receptors that we would like to target for both focus and mood/anxiety.  Believe that we could continue to maximize his current regimen as tolerated and improve anxiety and ADHD symptoms.  Will not consider stimulants, and patient is aware of this.   Patient denies safety concerns since last visit including thoughts of harm toward himself or others.  Identifying Information: Jeremiah Allen is a 38 y.o. male with a history of adjustment disorder, remote methamphetamine use, in remission, and a medical history  of stage IV colorectal cancer in remission  who is an established patient with [facility] for evaluation and management of ADHD and anxiety.    Risk Assessment: An assessment of suicide and violence risk factors was performed as part of this evaluation and is not  significantly changed from the last visit.             While future psychiatric events cannot be accurately predicted, the patient does not currently require acute inpatient psychiatric care and does not currently meet   involuntary commitment criteria.          Plan:  # ADHD, combined type Past medication trials:  Status of problem: Confirmed with DIVA assessment Interventions: -- Continue Wellbutrin  XL 300 mg daily  # Chronic PTSD Past medication trials:  Status of problem:  Interventions: -- Continue Cymbalta  20 mg daily for trauma related anxiety.    # Cannabis abuse #Methamphetamine use disorder; in early remission Past medication trials:  Status of problem: Moderate Interventions: -- Counseled on complete cessation of use  Return to care in 4 to 6 weeks  Patient was given contact information for behavioral health clinic and was instructed to call 911 for emergencies.    Patient and plan of care will be discussed with the Attending MD ,Dr. Rande, who agrees with the above statement and plan.   Subjective:  Chief Complaint:  No chief complaint on file.   Interval History: Patient reports depressive sx have worsened over the past week and a half. His new partner is supportive. 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   Health concerns have arisen, as another *** has appeared. 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   He has been more tired, feeling heavy especially within his chest. He has a lot of worries and  concerns.   He now has a car. His anger is more controlled. He is processing things well, but worried that his emotions are not experienced, due to previous feelings of not looking sick.  He is aware of his surroundings, and able to get out of situations. He is able to see things from different perspectives.   He has had 4-5 instances where his mood was so low, that he did not act on them, but he wrote about them. Found benefit in them. He does have outbursts where he says things that are harmful.   Taking Lomotil  for diarrhea/fecal incontinence. Going to [facility] for follow-up.   Denies concerns about mood. Waking up is easier.   Sleep is variable; broken, but getting an average of 6-7 hours of sleep.   Medication combination working well at current dosages. Able to control anger better. No longer having as many verbal outbursts. No longer having occurrences become catastrophic.  Still using marijuana vape. Smoking cigarettes 0.5 ppd, if that quantity. Denies use of illicit drugs. Social alcohol use.   Some passive thoughts about death with negative occurrences in life if cancer had done it's job, More out of frustration. Denies HI, and AVH.   Visit Diagnosis:  No diagnosis found.     Past Psychiatric History:  Diagnoses: bipolar disorder Medication trials: Abilify - took for 2 weeks. Previous psychiatrist/therapist: Current therapist: Juliene Patee, LCSW Hospitalizations: Denies Suicide attempts: 1-2 attempts in 70s. Stabbed self in chest. In the middle of chemo, attempted to OD at 35. SIB: Denies Hx of violence towards others: Denies Current access to guns: Denies Hx of trauma/abuse: See HPI Seizure/Head trauma: 2 concussions from wrestling.  1/30: DIVA (Diagnostic Interview for ADHD in Adults) assessment. Results are available on chart, detailing that patient meets criteria for ADHD, combined type. Of note, patient reports initial onset of symptoms around age 68 after acute  encephalomyelitis.  Of note, patient also did experience traumatic symptoms in childhood particularly surrounding waking up and the time in which he was expected to be ready for school.  While this certainly may play a role in patient's hyperactivity and need to always be productive, he does meet criteria for ADHD as well.  ADHD appears to be the better explanation of his current symptoms.   Past Medical History:  Past Medical History:  Diagnosis Date  . Anxiety   . Bipolar 1 disorder (HCC) 04/16/2021  . Bipolar disorder (HCC)   . Cancer (HCC)    Rectal  . GERD (gastroesophageal reflux disease)   . 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 DIVERTING ILEOSTOMY N/A 01/15/2022   Procedure: DIVERTING ILEOSTOMY;  Surgeon: Debby Hila, MD;  Location: [facility];  Service: General;  Laterality: N/A;  . FLEXIBLE SIGMOIDOSCOPY N/A 04/16/2021   Procedure: FLEXIBLE SIGMOIDOSCOPY;  Surgeon: Toledo, Ladell POUR, MD;  Location: [facility];  Service: Gastroenterology;  Laterality: N/A;  . ILEOSTOMY CLOSURE N/A 03/18/2022   Procedure: LOOP ILEOSTOMY REVERSAL;  Surgeon: Debby Hila, MD;  Location: [facility];  Service: General;  Laterality:  N/A;  . IR IMAGING GUIDED PORT INSERTION  04/28/2021  . IR REMOVAL TUN ACCESS W/ PORT W/O FL MOD SED  01/13/2023  . LAPAROSCOPY N/A 08/04/2021   Procedure: STAGING LAPAROSCOPY WITH INTRAOPERATIVE ULTRASOUND;  Surgeon: Dasie Leonor CROME, MD;  Location: [facility] OR;  Service: General;  Laterality: N/A;  . OPEN PARTIAL HEPATECTOMY  N/A 08/04/2021   Procedure: OPEN PARTIAL HEPATECTOMY;  Surgeon: Dasie Leonor CROME, MD;  Location: [facility] OR;  Service: General;  Laterality: N/A;  . WISDOM TOOTH EXTRACTION    . XI ROBOTIC ASSISTED LOWER ANTERIOR RESECTION N/A 01/15/2022   Procedure: XI ROBOTIC ASSISTED LOWER ANTERIOR RESECTION;  Surgeon: Debby Hila, MD;  Location: [facility];  Service: General;  Laterality: N/A;    Family Psychiatric  History: ASD- brother   Substance use- mom Alcohol- Dad  Family History:  Family History  Problem Relation Age of Onset  . Heart disease Mother   . Miscarriages / Stillbirths Sister   . Asthma Sister   . Stroke Sister        at birth  . 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  Lived in a crack house at 47.   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 Social History   Socioeconomic History  . Marital status: Single    Spouse name: Not on file  . Number of children: Not on file  . Years of education: Not on file  . Highest education level: Not on file  Occupational History  . Not on file  Tobacco Use  . Smoking status: Every Day    Current packs/day: 0.25    Average packs/day: 0.3 packs/day for 10.0 years (2.5 ttl pk-yrs)    Types: Cigarettes    Passive exposure: Current  . Smokeless tobacco: Never  . Tobacco comments:    If given proper anxiety medication I would  Vaping Use  . Vaping status: Never Used  Substance and Sexual Activity  . Alcohol use: Not Currently    Comment: occ  . Drug use: Yes    Frequency: 3.0 times per week    Types: Marijuana, Methamphetamines    Comment: 1/8 per day  . Sexual activity: Not Currently    Partners: Female    Comment: fiance  Other Topics Concern  . 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 <1/2 ppd; no alcohol. Fiance. No children; 2 cats and dog.    Social Drivers of Corporate investment banker Strain: Low Risk  (01/21/2023)   Received from [facility]   Overall Financial Resource Strain (CARDIA)   . Difficulty of Paying Living Expenses: Not hard at all  Recent Concern: Financial Resource Strain - Medium Risk (10/29/2022)   Overall Financial Resource Strain (CARDIA)   . Difficulty of Paying Living Expenses: Somewhat hard  Food Insecurity: Food Insecurity Present (03/19/2023)    Hunger Vital Sign   . Worried About Programme researcher, broadcasting/film/video in the Last Year: Never true   . Ran Out of Food in the Last Year: Often true  Transportation Needs: No Transportation Needs (03/19/2023)   PRAPARE - Transportation   . Lack of Transportation (Medical): No   . Lack of Transportation (Non-Medical): No  Physical Activity: Inactive (10/29/2022)   Exercise Vital Sign   . Days of Exercise per Week: 0 days   . Minutes of Exercise per Session: 0 min  Stress: Stress Concern  Present (10/29/2022)   [employer] of Occupational Health - Occupational Stress Questionnaire   . Feeling of Stress : Very much  Social Connections: Moderately Isolated (10/29/2022)   Social Connection and Isolation Panel   . Frequency of Communication with Friends and Family: More than three times a week   . Frequency of Social Gatherings with Friends and Family: More than three times a week   . Attends Religious Services: Never   . Active Member of Clubs or Organizations: No   . Attends Banker Meetings: Never   . Marital Status: Living with partner    Allergies:  Allergies  Allergen Reactions  . Crab Extract Anaphylaxis and Swelling    Throat swelling  Reaction only to crab, no other shellfish  . Dilaudid  [Hydromorphone ] Itching and Other (See Comments)    Skin tingling sensations   . Seroquel [Quetiapine] Other (See Comments)    Skin tingling/crawling sensation    Current Medications: Current Outpatient Medications  Medication Sig Dispense Refill  . acetaminophen  (TYLENOL ) 500 MG tablet Take 500 mg by mouth 2 (two) times daily as needed for moderate pain (pain score 4-6), headache or fever.    . buPROPion  (WELLBUTRIN  XL) 300 MG 24 hr tablet Take 1 tablet (300 mg total) by mouth every morning. 30 tablet 1  . colestipol  (COLESTID ) 1 g tablet Take 1 tablet (1 g total) by mouth 3 (three) times daily as needed (diarrhea/fecal urgency). Do not take within 2 hours of other medications. (Patient  not taking: Reported on 06/03/2023) 90 tablet 3  . diphenoxylate -atropine  (LOMOTIL ) 2.5-0.025 MG tablet Take 1 tablet by mouth every 6 (six) hours as needed for diarrhea 30 tablet 1  . DULoxetine  (CYMBALTA ) 20 MG capsule Take 1 capsule (20 mg total) by mouth daily. 30 capsule 1   No current facility-administered medications for this visit.    ROS: Review of Systems  Constitutional:  Negative for diaphoresis, fatigue and unexpected weight change.  Gastrointestinal:  Positive for diarrhea.       Chronic  Genitourinary: Negative.   Musculoskeletal:  Negative for arthralgias.  Neurological:  Negative for dizziness, light-headedness and headaches.     Objective:  Psychiatric Specialty Exam: There were no vitals taken for this visit.There is no height or weight on file to calculate BMI.  General Appearance: Casual  Eye Contact:  Good  Speech:  Less hyperverbal and less fast paced speech; more normal  Volume:  Normal  Mood:  Euthymic  Affect:  Appropriate and Congruent  Thought Content: WDL and Logical  Suicidal Thoughts:  No  Homicidal Thoughts:  No  Thought Process:  Coherent and Goal Directed  Orientation:  Full (Time, Place, and Person)    Memory: Immediate;   Good Recent;   Good Remote;   Good  Judgment:  Fair, improved  Insight:  Fair and Shallow, improved  Concentration:  Concentration: Good and Attention Span: Fair  Recall: not formally assessed   Fund of Knowledge: Good  Language: Good  Psychomotor Activity:  Normal  Akathisia:  No  AIMS (if indicated): not done  Assets:  Communication Skills Desire for Improvement Financial Resources/Insurance Leisure Time Resilience Social Support Talents/Skills Vocational/Educational  ADL's:  Intact  Cognition: WNL  Sleep:  Good   PE: General: well-appearing; no acute distress  Pulm: no increased work of breathing on room air  Strength & Muscle Tone: within normal limits Neuro: no focal neurological deficits  observed Gait & Station: normal  Metabolic Disorder Labs: Lab Results  Component Value Date   HGBA1C 5.1 03/21/2023   MPG 99.67 03/21/2023   No results found for: PROLACTIN Lab Results  Component Value Date   CHOL 133 03/21/2023   TRIG 65 03/21/2023   HDL 50 03/21/2023   CHOLHDL 2.7 03/21/2023   VLDL 13 03/21/2023   LDLCALC 70 03/21/2023   No results found for: TSH  Therapeutic Level Labs: No results found for: LITHIUM No results found for: VALPROATE No results found for: CBMZ  Screenings: GAD-7    Flowsheet Row Counselor from 10/29/2022 in [facility]  Total GAD-7 Score 20   PHQ2-9    Flowsheet Row ED from 03/19/2023 in [facility] Counselor from 10/29/2022 in [facility] Social Work from 04/29/2021 in [facility] [place]  PHQ-2 Total Score 2 4 2   PHQ-9 Total Score 8 21 6    Flowsheet Row ED from 03/19/2023 in [facility] ED from 03/18/2023 in [facility] Emergency Department at [facility] Counselor from 10/29/2022 in [facility]  C-SSRS RISK CATEGORY High Risk Error: Q2 is Yes, you must answer 3, 4, and 5 Low Risk    Collaboration of Care: Collaboration of Care: Dr. Mercy and Jeremiah Patee, LCSW  Patient/Guardian was advised Release of Information must be obtained prior to any record release in order to collaborate their care with an outside provider. Patient/Guardian was advised if they have not already done so to contact the registration department to sign all necessary forms in order for us  to release information regarding their care.   Consent: Patient/Guardian gives verbal consent for treatment and assignment of benefits for services provided during this visit. Patient/Guardian expressed understanding and agreed to proceed.   Charmaine Myrtle, MD 07/22/2023  4:09 PM

## 2023-07-27 ENCOUNTER — Other Ambulatory Visit (HOSPITAL_BASED_OUTPATIENT_CLINIC_OR_DEPARTMENT_OTHER): Payer: Self-pay

## 2023-07-29 ENCOUNTER — Other Ambulatory Visit (HOSPITAL_BASED_OUTPATIENT_CLINIC_OR_DEPARTMENT_OTHER): Payer: Self-pay

## 2023-08-02 ENCOUNTER — Other Ambulatory Visit (HOSPITAL_BASED_OUTPATIENT_CLINIC_OR_DEPARTMENT_OTHER): Payer: Self-pay

## 2023-08-02 ENCOUNTER — Ambulatory Visit (INDEPENDENT_AMBULATORY_CARE_PROVIDER_SITE_OTHER): Payer: MEDICAID | Admitting: Licensed Clinical Social Worker

## 2023-08-02 DIAGNOSIS — F4325 Adjustment disorder with mixed disturbance of emotions and conduct: Secondary | ICD-10-CM | POA: Diagnosis not present

## 2023-08-02 DIAGNOSIS — F4312 Post-traumatic stress disorder, chronic: Secondary | ICD-10-CM

## 2023-08-02 DIAGNOSIS — F902 Attention-deficit hyperactivity disorder, combined type: Secondary | ICD-10-CM

## 2023-08-02 NOTE — Progress Notes (Signed)
 THERAPIST PROGRESS NOTE  Virtual Visit via Video Note  I connected with Jeremiah Allen on 08/02/23 at  2:00 PM EDT by a video enabled telemedicine application and verified that I am speaking with the correct person using two identifiers.  Location[place]  Provider: [facility]    I discussed the limitations of evaluation and management by telemedicine and the availability of in person appointments. The patient expressed understanding and agreed to proceed.     I discussed the assessment and treatment plan with the patient. The patient was provided an opportunity to ask questions and all were answered. The patient agreed with the plan and demonstrated an understanding of the instructions.   The patient was advised to call back or seek an in-person evaluation if the symptoms worsen or if the condition fails to improve as anticipated.  I provided 45 minutes of non-face-to-face time during this encounter.   Juliene GORMAN Patee, LCSW  Participation Level: Active  Behavioral Response: CasualAlertAnxious and Irritable  Type of Therapy: Individual Therapy  Treatment Goals addressed:  Active     Anxiety     LTG: Eusevio will score less than 5 on the Generalized Anxiety Disorder 7 Scale (GAD-7)  (Progressing)     Start:  10/29/22    Expected End:  09/02/23         STG: Jeremiah will participate in at least 80% of scheduled individual psychotherapy sessions  (Progressing)     Start:  10/29/22    Expected End:  09/02/23         STG: Jeremiah will complete at least 80% of assigned homework  (Progressing)     Start:  10/29/22    Expected End:  09/02/23         STG: Jeremiah will practice problem solving skills 3 times per week for the next 4 weeks.  (Progressing)     Start:  10/29/22    Expected End:  09/02/23         identify 3 triggers for anxiety  (Progressing)     Start:  10/29/22    Expected End:  09/02/23         Review results of GAD-7  with Jeremiah to track progress     Start:  10/29/22         Discuss risks and benefits of medication treatment options for this problem and prescribe as indicated (Completed)     Start:  10/29/22    End:  01/13/23      Encourage Jeremiah to take psychotropic medication(s) as prescribed (Completed)     Start:  10/29/22    End:  04/05/23      Work with Jeremiah to track symptoms, triggers, and/or skill use through a mood chart, diary card, or journal (Completed)     Start:  10/29/22    End:  01/13/23      Perform psychoeducation regarding anxiety disorders (Completed)     Start:  10/29/22    End:  04/05/23    Intervention note from Counselor 01/13/2023 by Patee Juliene GORMAN, LCSW     Reviewed with patient during session.          Provide Braelin with educational information and reading material on anxiety, its causes, and symptoms.  (Completed)     Start:  10/29/22    End:  01/13/23      Work with patient individually to identify the major components of a recent episode of anxiety: physical symptoms, major  thoughts and images, and major behaviors they experienced (Completed)     Start:  10/29/22    End:  01/13/23      Work with Jeremiah to identify 3 personal goals for managing their anxiety to work on during current treatment.  (Completed)     Start:  10/29/22    End:  01/13/23      Work with Jeremiah to identify a minimum of 3 consequences of avoidance.  (Completed)     Start:  10/29/22    End:  04/05/23      Work with Jeremiah to identify a minimum of 3 alternative coping behaviors to avoidance.  (Completed)     Start:  10/29/22    End:  04/05/23    Intervention Note     Reviewed with patient during session.          Create a weekly activity schedule (Completed)     Start:  10/29/22    End:  06/14/23      Perform motivational interviewing regarding engagement and attendance with therapy (Completed)     Start:  10/29/22    End:  06/14/23    Intervention Note      Reviewed with patient during session.          Perform motivational interviewing regarding completion of homework assignments (Completed)     Start:  10/29/22    End:  06/14/23    Intervention Note     Reviewed with patient during session.          Perform motivational interviewing regarding use of tools (Completed)     Start:  10/29/22    End:  06/14/23    Intervention Note     Reviewed with patient during session.          Perform motivational interviewing regarding physical activity (Completed)     Start:  10/29/22    End:  06/14/23    Intervention Note     Reviewed with patient during session.          Perform motivational interviewing regarding medication adherence (Completed)     Start:  10/29/22    End:  06/14/23    Intervention Note     Reviewed with patient during session.            OP Depression     LTG: Landry will score less than 5 on the Patient Health Questionnaire (PHQ-9)      Start:  10/29/22    Expected End:  09/02/23         identify 3 trigger for depression  (Progressing)     Start:  10/29/22    Expected End:  09/02/23         identify 3 coping skills for depression  (Progressing)     Start:  10/29/22    Expected End:  09/02/23         Encourage Jeremiah to participate in recovery peer support activities weekly      Start:  10/29/22         Therapist will administer the PHQ-9 at weekly intervals for the next 8 weeks     Start:  10/29/22         Work with Jeremiah to track symptoms, triggers, and/or skill use through a mood chart, diary card, or journal (Completed)     Start:  10/29/22    End:  01/13/23      Provide Jeremiah educational information and reading material on dissociation, its causes,  and symptoms (Completed)     Start:  10/29/22    End:  01/13/23      Work with Jeremiah to identify the major components of a recent episode of depression: physical symptoms, major thoughts and images, and major behaviors  they experienced (Completed)     Start:  10/29/22    End:  04/05/23    Intervention Note     Reviewed with patient during session.             ProgressTowards Goals: Progressing  Interventions: CBT, Motivational Interviewing, and Supportive   Suicidal/Homicidal: Nowithout intent/plan  Therapist Response:   Kylee was alert and oriented x 5.  He was pleasant, cooperative, maintained good eye contact.  He engaged well in therapy session and was dressed casually.  Amarrion presented with anxious mood\affect.  Patient comes in today with primary stressors as his anger.  Overall Adrean reports that his anger has decreased.  Donnelle has episodes of irrational anger 3 times weekly.  This is down from 6 out of 7 days weekly.  Ariana advocated for himself with his medication management team and got an increase in his medication dosage which has helped.  Oleg would like to learn further coping skills outside of medication management to better control his anger outbursts.  LCSW and patient spoke today about triggers to his anger.    LCSW utilized cognitive behavioral therapy for patient to better understand the process of how he is handling things versus the actual problems themselves.  LCSW provided patient acronym for TIPP: Temperature, intensive exercise, progressive muscle relaxation, paced breathing.  LCSW utilized supportive therapy for praise and encouragement.  LCSW utilized psychoanalytic therapy for patient to express thoughts, feelings and concerns while validating his feelings.  LCSW utilized motivational interviewing for open-ended questions and reflective listening.    Plan: Return again in 4 weeks.  Diagnosis: Adjustment disorder with mixed disturbance of emotions and conduct  ADHD (attention deficit hyperactivity disorder), combined type  Chronic post-traumatic stress disorder (PTSD)  Collaboration of Care: Other None to  Patient/Guardian was advised Release of  Information must be obtained prior to any record release in order to collaborate their care with an outside provider. Patient/Guardian was advised if they have not already done so to contact the registration department to sign all necessary forms in order for us  to release information regarding their care.   Consent: Patient/Guardian gives verbal consent for treatment and assignment of benefits for services provided during this visit. Patient/Guardian expressed understanding and agreed to proceed.   Juliene GORMAN Patee, LCSW 08/02/2023

## 2023-08-05 ENCOUNTER — Other Ambulatory Visit (HOSPITAL_COMMUNITY): Payer: Self-pay

## 2023-08-05 ENCOUNTER — Other Ambulatory Visit (HOSPITAL_BASED_OUTPATIENT_CLINIC_OR_DEPARTMENT_OTHER): Payer: Self-pay

## 2023-08-09 ENCOUNTER — Telehealth (HOSPITAL_COMMUNITY): Payer: Self-pay

## 2023-08-09 ENCOUNTER — Other Ambulatory Visit: Payer: Self-pay

## 2023-08-09 NOTE — Telephone Encounter (Signed)
 Error encounter.

## 2023-08-09 NOTE — Telephone Encounter (Signed)
 Called and asked [facility]  to [facility] PA [facility], I called and asked last Friday they said they would sill no fax. Gave them direct nurses station number!   JNL

## 2023-08-10 ENCOUNTER — Other Ambulatory Visit (HOSPITAL_BASED_OUTPATIENT_CLINIC_OR_DEPARTMENT_OTHER): Payer: Self-pay

## 2023-08-10 ENCOUNTER — Telehealth (HOSPITAL_COMMUNITY): Payer: Self-pay
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
## 2023-08-10 NOTE — Telephone Encounter (Signed)
 PA was started for Pt's DULoxetine  HCL40mg  today. And was approved from 08/10/2023-08-09-2024. Called and notified pharmacy.   JNL

## 2023-08-10 NOTE — Telephone Encounter (Signed)
 Error encounter.   JNL

## 2023-08-11 ENCOUNTER — Other Ambulatory Visit (HOSPITAL_BASED_OUTPATIENT_CLINIC_OR_DEPARTMENT_OTHER): Payer: Self-pay

## 2023-08-11 MED ORDER — DIPHENOXYLATE-ATROPINE 2.5-0.025 MG PO TABS
1.0000 | ORAL_TABLET | Freq: Four times a day (QID) | ORAL | 1 refills | Status: DC | PRN
  Filled 2023-08-11: qty 30, 8d supply, fill #0
  Filled 2023-08-16 – 2023-08-17 (×2): qty 30, 8d supply, fill #1

## 2023-08-12 ENCOUNTER — Other Ambulatory Visit: Payer: Self-pay

## 2023-08-12 ENCOUNTER — Other Ambulatory Visit (HOSPITAL_BASED_OUTPATIENT_CLINIC_OR_DEPARTMENT_OTHER): Payer: Self-pay

## 2023-08-12 ENCOUNTER — Encounter (HOSPITAL_COMMUNITY): Payer: Self-pay

## 2023-08-12 ENCOUNTER — Ambulatory Visit (INDEPENDENT_AMBULATORY_CARE_PROVIDER_SITE_OTHER): Payer: MEDICAID

## 2023-08-12 VITALS — BP 115/74 | HR 67 | Ht 69.0 in | Wt 144.0 lb

## 2023-08-12 DIAGNOSIS — F1210 Cannabis abuse, uncomplicated: Secondary | ICD-10-CM

## 2023-08-12 DIAGNOSIS — F4312 Post-traumatic stress disorder, chronic: Secondary | ICD-10-CM

## 2023-08-12 DIAGNOSIS — F902 Attention-deficit hyperactivity disorder, combined type: Secondary | ICD-10-CM | POA: Diagnosis not present

## 2023-08-12 MED ORDER — DULOXETINE HCL 40 MG PO CPEP
40.0000 mg | ORAL_CAPSULE | Freq: Every day | ORAL | 1 refills | Status: DC
  Filled 2023-08-12: qty 30, 30d supply, fill #0

## 2023-08-12 MED ORDER — BUPROPION HCL ER (XL) 300 MG PO TB24
300.0000 mg | ORAL_TABLET | ORAL | 1 refills | Status: DC
  Filled 2023-08-12: qty 30, 30d supply, fill #0
  Filled 2023-09-02 – 2023-09-15 (×2): qty 30, 30d supply, fill #1

## 2023-08-12 MED ORDER — BUPROPION HCL ER (XL) 300 MG PO TB24
300.0000 mg | ORAL_TABLET | ORAL | 1 refills | Status: DC
  Filled 2023-08-12: qty 30, 30d supply, fill #0

## 2023-08-12 MED ORDER — DULOXETINE HCL 40 MG PO CPEP
40.0000 mg | ORAL_CAPSULE | Freq: Every day | ORAL | 1 refills | Status: DC
  Filled 2023-08-12 – 2023-09-02 (×2): qty 30, 30d supply, fill #0
  Filled 2023-10-05: qty 30, 30d supply, fill #1

## 2023-08-12 NOTE — Addendum Note (Signed)
 Addended by: SUSEN FLASH on: 08/12/2023 02:37 PM   Modules accepted: Orders

## 2023-08-16 ENCOUNTER — Other Ambulatory Visit (HOSPITAL_BASED_OUTPATIENT_CLINIC_OR_DEPARTMENT_OTHER): Payer: Self-pay

## 2023-08-17 ENCOUNTER — Other Ambulatory Visit (HOSPITAL_BASED_OUTPATIENT_CLINIC_OR_DEPARTMENT_OTHER): Payer: Self-pay

## 2023-08-19 ENCOUNTER — Encounter (HOSPITAL_COMMUNITY): Payer: MEDICAID

## 2023-09-02 ENCOUNTER — Ambulatory Visit (INDEPENDENT_AMBULATORY_CARE_PROVIDER_SITE_OTHER): Payer: MEDICAID | Admitting: Licensed Clinical Social Worker

## 2023-09-02 ENCOUNTER — Other Ambulatory Visit: Payer: Self-pay

## 2023-09-02 ENCOUNTER — Other Ambulatory Visit (HOSPITAL_BASED_OUTPATIENT_CLINIC_OR_DEPARTMENT_OTHER): Payer: Self-pay

## 2023-09-02 DIAGNOSIS — F4325 Adjustment disorder with mixed disturbance of emotions and conduct: Secondary | ICD-10-CM

## 2023-09-02 DIAGNOSIS — F4312 Post-traumatic stress disorder, chronic: Secondary | ICD-10-CM

## 2023-09-02 DIAGNOSIS — F902 Attention-deficit hyperactivity disorder, combined type: Secondary | ICD-10-CM

## 2023-09-02 NOTE — Progress Notes (Signed)
 THERAPIST PROGRESS NOTE  Virtual Visit via Video Note  I connected with Jayson Rosebush Wyne on 09/02/23 at  2:00 PM EDT by a video enabled telemedicine application and verified that I am speaking with the correct person using two identifiers.  Location: Patient: [facility]  Provider: Providers Home    I discussed the limitations of evaluation and management by telemedicine and the availability of in person appointments. The patient expressed understanding and agreed to proceed.    I discussed the assessment and treatment plan with the patient. The patient was provided an opportunity to ask questions and all were answered. The patient agreed with the plan and demonstrated an understanding of the instructions.   The patient was advised to call back or seek an in-person evaluation if the symptoms worsen or if the condition fails to improve as anticipated.  I provided 60 minutes of non-face-to-face time during this encounter.   Juliene GORMAN Patee, LCSW   Participation Level: Active  Behavioral Response: CasualAlertAnxious and Depressed  Type of Therapy: Individual Therapy  Treatment Goals addressed:  Active     Anxiety     LTG: Malvern will score less than 5 on the Generalized Anxiety Disorder 7 Scale (GAD-7)  (Progressing)     Start:  10/29/22    Expected End:  03/03/24         STG: Jayson will participate in at least 80% of scheduled individual psychotherapy sessions  (Progressing)     Start:  10/29/22    Expected End:  03/03/24         STG: Jayson will complete at least 80% of assigned homework  (Progressing)     Start:  10/29/22    Expected End:  03/03/24         STG: Jayson will practice problem solving skills 3 times per week for the next 4 weeks.  (Progressing)     Start:  10/29/22    Expected End:  03/03/24         identify 3 triggers for anxiety  (Progressing)     Start:  10/29/22    Expected End:  03/03/24         Review results of GAD-7 with  Jayson to track progress     Start:  10/29/22         Discuss risks and benefits of medication treatment options for this problem and prescribe as indicated (Completed)     Start:  10/29/22    End:  01/13/23      Encourage Jayson to take psychotropic medication(s) as prescribed (Completed)     Start:  10/29/22    End:  04/05/23      Work with Jayson to track symptoms, triggers, and/or skill use through a mood chart, diary card, or journal (Completed)     Start:  10/29/22    End:  01/13/23      Perform psychoeducation regarding anxiety disorders (Completed)     Start:  10/29/22    End:  04/05/23    Intervention note from Counselor 01/13/2023 by Patee Juliene GORMAN, LCSW     Reviewed with patient during session.          Provide Salim with educational information and reading material on anxiety, its causes, and symptoms.  (Completed)     Start:  10/29/22    End:  01/13/23      Work with patient individually to identify the major components of a recent episode of anxiety: physical symptoms, major thoughts  and images, and major behaviors they experienced (Completed)     Start:  10/29/22    End:  01/13/23      Work with Jayson to identify 3 personal goals for managing their anxiety to work on during current treatment.  (Completed)     Start:  10/29/22    End:  01/13/23      Work with Jayson to identify a minimum of 3 consequences of avoidance.  (Completed)     Start:  10/29/22    End:  04/05/23      Work with Jayson to identify a minimum of 3 alternative coping behaviors to avoidance.  (Completed)     Start:  10/29/22    End:  04/05/23    Intervention Note     Reviewed with patient during session.          Create a weekly activity schedule (Completed)     Start:  10/29/22    End:  06/14/23      Perform motivational interviewing regarding engagement and attendance with therapy (Completed)     Start:  10/29/22    End:  06/14/23    Intervention Note      Reviewed with patient during session.          Perform motivational interviewing regarding completion of homework assignments (Completed)     Start:  10/29/22    End:  06/14/23    Intervention Note     Reviewed with patient during session.          Perform motivational interviewing regarding use of tools (Completed)     Start:  10/29/22    End:  06/14/23    Intervention Note     Reviewed with patient during session.          Perform motivational interviewing regarding physical activity (Completed)     Start:  10/29/22    End:  06/14/23    Intervention Note     Reviewed with patient during session.          Perform motivational interviewing regarding medication adherence (Completed)     Start:  10/29/22    End:  06/14/23    Intervention Note     Reviewed with patient during session.            OP Depression     LTG: Abdoulaye will score less than 5 on the Patient Health Questionnaire (PHQ-9)      Start:  10/29/22    Expected End:  03/03/24         identify 3 trigger for depression  (Progressing)     Start:  10/29/22    Expected End:  03/03/24         identify 3 coping skills for depression  (Progressing)     Start:  10/29/22    Expected End:  03/03/24         Encourage Jayson to participate in recovery peer support activities weekly      Start:  10/29/22         Therapist will administer the PHQ-9 at weekly intervals for the next 8 weeks     Start:  10/29/22         Work with Jayson to track symptoms, triggers, and/or skill use through a mood chart, diary card, or journal (Completed)     Start:  10/29/22    End:  01/13/23      Provide Jayson educational information and reading material on dissociation, its causes, and  symptoms (Completed)     Start:  10/29/22    End:  01/13/23      Work with Jayson to identify the major components of a recent episode of depression: physical symptoms, major thoughts and images, and major behaviors they  experienced (Completed)     Start:  10/29/22    End:  04/05/23    Intervention Note     Reviewed with patient during session.             ProgressTowards Goals: Progressing  Interventions: CBT, Motivational Interviewing, and Supportive   Suicidal/Homicidal: Nowithout intent/plan  Therapist Response:  Pt was alert and oriented x 5. He was dressed casually and engaged well in therapy session. Tahjay presents with anxious mood/affect. Pt engaged well in therapy session and was dressed casually.   Pt reports anxiety for anger outbursts. He reports that he is struggling when being explained something and not understanding it. LCSW used intervention, for education on communication. LCSW educated pt on active listening to help slow things down to better understand something. LCSW educated on the use of paraphrasing and summarization. LCSW educated using DBT for TIPP: Temperature, intense exercise, paced breathing, and progressive muscle relaxations   Other stressor for pt is illness for severe stomach problems. Pt reports he has GI issues from having multiple surgeries with his GI system when he was not in remission for his cancer. He has tried multiple times to get ahold of his GI MD but has not been able to get through. He has tried my chart and phone, but nothing has been done to get him in. LCSW encouraged pt to advocate for himself. LCSW validated feelings in session. LCSW educated pt on trying a different provider if he did not feel satisfied with the care he was getting or a new hospital system.    Plan: Return again in 4 weeks.  Diagnosis: Adjustment disorder with mixed disturbance of emotions and conduct  ADHD (attention deficit hyperactivity disorder), combined type  Chronic post-traumatic stress disorder (PTSD)  Collaboration of Care: Other None today   Patient/Guardian was advised Release of Information must be obtained prior to any record release in order to collaborate  their care with an outside provider. Patient/Guardian was advised if they have not already done so to contact the registration department to sign all necessary forms in order for us  to release information regarding their care.   Consent: Patient/Guardian gives verbal consent for treatment and assignment of benefits for services provided during this visit. Patient/Guardian expressed understanding and agreed to proceed.   Juliene GORMAN Patee, LCSW 09/02/2023

## 2023-09-04 ENCOUNTER — Other Ambulatory Visit (HOSPITAL_BASED_OUTPATIENT_CLINIC_OR_DEPARTMENT_OTHER): Payer: Self-pay

## 2023-09-04 MED ORDER — DIPHENOXYLATE-ATROPINE 2.5-0.025 MG PO TABS
1.0000 | ORAL_TABLET | Freq: Four times a day (QID) | ORAL | 1 refills | Status: DC | PRN
  Filled 2023-09-04: qty 30, 8d supply, fill #0
  Filled 2023-09-15 (×2): qty 30, 8d supply, fill #1

## 2023-09-09 ENCOUNTER — Telehealth: Payer: Self-pay | Admitting: *Deleted

## 2023-09-09 ENCOUNTER — Telehealth: Payer: Self-pay | Admitting: Oncology

## 2023-09-09 NOTE — Telephone Encounter (Signed)
 Jeremiah Allen asking for Dr. Cloretta to refer him to another GI provider, since Dr. IVAR Francis Boss can't see him for a couple months. Informed that the wait for a new GI provider is 2-3 months as well when not an acute issue and may not be accepted if they see you already have one. I suggested he stay with current GI and call back to ask if he could be seen by NP/PA instead and be put on wait list.

## 2023-09-15 ENCOUNTER — Other Ambulatory Visit: Payer: Self-pay

## 2023-09-16 ENCOUNTER — Other Ambulatory Visit (HOSPITAL_BASED_OUTPATIENT_CLINIC_OR_DEPARTMENT_OTHER): Payer: Self-pay
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
## 2023-09-27 ENCOUNTER — Other Ambulatory Visit (HOSPITAL_BASED_OUTPATIENT_CLINIC_OR_DEPARTMENT_OTHER): Payer: Self-pay

## 2023-09-28 ENCOUNTER — Other Ambulatory Visit (HOSPITAL_BASED_OUTPATIENT_CLINIC_OR_DEPARTMENT_OTHER): Payer: Self-pay

## 2023-09-28 MED ORDER — DIPHENOXYLATE-ATROPINE 2.5-0.025 MG PO TABS
1.0000 | ORAL_TABLET | Freq: Four times a day (QID) | ORAL | 1 refills | Status: DC | PRN
  Filled 2023-09-28: qty 30, 8d supply, fill #0
  Filled 2023-10-05: qty 30, 8d supply, fill #1

## 2023-09-30 ENCOUNTER — Encounter (HOSPITAL_COMMUNITY): Payer: MEDICAID

## 2023-10-05 ENCOUNTER — Other Ambulatory Visit: Payer: Self-pay

## 2023-10-05 ENCOUNTER — Other Ambulatory Visit (HOSPITAL_COMMUNITY): Payer: Self-pay | Admitting: Child and Adolescent Psychiatry

## 2023-10-05 ENCOUNTER — Other Ambulatory Visit (HOSPITAL_BASED_OUTPATIENT_CLINIC_OR_DEPARTMENT_OTHER): Payer: Self-pay

## 2023-10-05 DIAGNOSIS — F902 Attention-deficit hyperactivity disorder, combined type: Secondary | ICD-10-CM

## 2023-10-05 MED ORDER — BUPROPION HCL ER (XL) 300 MG PO TB24
300.0000 mg | ORAL_TABLET | ORAL | 1 refills | Status: DC
  Filled 2023-10-05 – 2023-10-13 (×2): qty 30, 30d supply, fill #0

## 2023-10-05 NOTE — Telephone Encounter (Signed)
 Sahil - can you please send refills for this patient. I think I sent rx last time due to delay in your medicaid enrollment.

## 2023-10-07 NOTE — Progress Notes (Cosign Needed)
 BH MD Outpatient Progress Note  07/22/2023 4:09 PM  Seraj Dunnam  MRN:  [id number]  Assessment:  Jayson Alford Manning presents for follow-up evaluation in-person. The patient has been followed up in the clinic since January for PTSD, ADHD and marijuana use.  He was initially started on bupropion  150 mg that was increased to 300 mg for ADHD.  He was also started on Cymbalta  20 mg daily titrated up to 40 mg for PTSD, anger outbursts.   The patient has been fairly stable on the current dose of medications though he has sometimes intense anger outbursts but he is able to control them, able to recognize factors leading to those events.  He has continued his marijuana use, 8 g each day we tried to encourage cessation however patient remains precontemplative regarding reduction as per patient that has been helping him with his PTSD, sleep and appetite.  Patient has reported worsening in his mood but he relates that to ongoing stressors including work related and financial constraints.  He is denying any active or passive SI/HI/AVH.  He continues to demonstrate some flight of ideas, pressured speech although this appears to be his relative baseline, due to his ongoing ADHD and PTSD.  No psychomotor agitation and he has responded well to the increased doses of Wellbutrin  and Cymbalta .  We plan to continue the same medication regimen for now, he has had no side effects.  Patient is amenable to the plan.  He is doing therapy in the clinic, reports good therapeutic relationship.  Patient requested a follow-up with a male provider as the current provider reminds him of past trauma.  Plan to follow-up with him with a different provider in 6 to 8 weeks.  Identifying Information: Deen Deguia is a 38 y.o. male with a history of adjustment disorder, remote methamphetamine use, in remission, and a medical history of stage IV colorectal cancer in remission  who is an established patient with [facility] for evaluation and management of ADHD and anxiety.    Risk Assessment: An assessment of suicide and violence risk factors was performed as part of this evaluation and is not  significantly changed from the last visit.             While future psychiatric events cannot be accurately predicted, the patient does not currently require acute inpatient psychiatric care and does not currently meet [facility]  involuntary commitment criteria.          Plan:  # ADHD, combined type Past medication trials:  Status of problem: Confirmed with DIVA assessment Interventions: -- Continue Wellbutrin  XL 300 mg daily  # Chronic PTSD Past medication trials:  Status of problem: Improving Interventions: -- Continue Cymbalta  40 mg daily for trauma related anxiety and worsened depressed mood.  -- Continue therapy in the clinic  # Cannabis abuse #Methamphetamine use disorder; in early remission Past medication trials:  Status of problem: Moderate Interventions: -- Counseled on complete cessation of use  Patient was given contact information for behavioral health clinic and was instructed to call 911 for emergencies.    Patient and plan of care will be discussed with the Attending MD ,Dr. Susen, who agrees with the above statement and plan.   Subjective:  Chief Complaint:  Chief Complaint  Patient presents with   Follow-up   Post-Traumatic Stress Disorder   ADHD   Medication Refill    Interval History:  At the previous visit, the patient reported mild depressive symptoms and  feeling tired.  He had passive suicidal ideations.  He denied any acute safety concerns.  He was continued on Wellbutrin  at 300 mg and his dose for Cymbalta  was increased to 40 mg daily.  Today, patient reported that he was doing worse , due to his mood which he relates to increased agitation, financial and work-related stressors.  He has been trying to work for [employer] and has an interview  planned in the current future.  He denied any active or passive SI/HI/AVH however reported that sometimes I feel like why did not cancer do its thing .  Reported that he has been compliant with medications, denied any side effects.  He reported continued poor appetite and troubles going to the bed, no difficulty staying asleep, denied any nightmares or flashbacks.  Reported that marijuana has been helpful to him to go to bed.  He denied any symptoms of mania or psychosis.  He reported using marijuana 8 g every day, denied using any other substances including alcohol or cigarettes.  His dose for Lomotil  for diarrhea and fecal incontinence at [facility] has recently been increased. We discussed about complete cessation of marijuana, patient is currently not amenable.  Patient reported that my medications have been working well , wanted to continue medications at the similar dose.  The prescription was sent to the preferred pharmacy, plan to follow-up with a different provider as the patient requested a male provider.  Visit Diagnosis:    ICD-10-CM   1. Chronic post-traumatic stress disorder (PTSD)  F43.12 DULoxetine  HCl 40 MG CPEP    2. Attention deficit hyperactivity disorder (ADHD), combined type  F90.2 buPROPion  (WELLBUTRIN  XL) 300 MG 24 hr tablet       Past Psychiatric History:  Diagnoses: bipolar disorder Medication trials: Abilify - took for 2 weeks. Previous psychiatrist/therapist: Current therapist: Juliene Patee, LCSW Hospitalizations: Denies Suicide attempts: 1-2 attempts in 70s. Stabbed self in chest. In the middle of chemo, attempted to OD at 35. SIB: Denies Hx of violence towards others: Denies Current access to guns: Denies Hx of trauma/abuse: See HPI Seizure/Head trauma: 2 concussions from wrestling.  1/30: DIVA (Diagnostic Interview for ADHD in Adults) assessment. Results are available on chart, detailing that patient meets criteria for ADHD, combined type. 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  Of note, patient also did experience traumatic symptoms in childhood particularly surrounding waking up and the time in which he was expected to be ready for school.  While this certainly may play a role in patient's hyperactivity and need to always be productive, he does meet criteria for ADHD as well.  ADHD appears to be the better explanation of his current symptoms.   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    Past Surgical History:  Procedure Laterality Date   DIVERTING ILEOSTOMY N/A 01/15/2022   Procedure: DIVERTING ILEOSTOMY;  Surgeon: Debby Hila, MD;  Location: [facility];  Service: General;  Laterality: N/A;   FLEXIBLE SIGMOIDOSCOPY N/A 04/16/2021   Procedure: FLEXIBLE SIGMOIDOSCOPY;  Surgeon: Toledo, Ladell POUR, MD;  Location: [facility];  Service: Gastroenterology;  Laterality: N/A;   ILEOSTOMY CLOSURE N/A 03/18/2022   Procedure: LOOP ILEOSTOMY REVERSAL;  Surgeon: Debby Hila, MD;  Location: [facility];  Service: General;  Laterality: N/A;   IR IMAGING GUIDED PORT INSERTION  04/28/2021   IR REMOVAL TUN ACCESS W/ PORT W/O FL MOD SED  01/13/2023   LAPAROSCOPY N/A 08/04/2021   Procedure: STAGING LAPAROSCOPY WITH INTRAOPERATIVE ULTRASOUND;  Surgeon: Dasie Leonor CROME, MD;  Location: [facility];  Service: General;  Laterality: N/A;   OPEN PARTIAL HEPATECTOMY  N/A 08/04/2021   Procedure: OPEN PARTIAL HEPATECTOMY;  Surgeon: Dasie Leonor CROME, MD;  Location: [facility];  Service: General;  Laterality: N/A;   WISDOM TOOTH EXTRACTION     XI ROBOTIC ASSISTED LOWER ANTERIOR RESECTION N/A 01/15/2022   Procedure: XI ROBOTIC ASSISTED LOWER ANTERIOR RESECTION;  Surgeon: Debby Hila, MD;  Location: [facility];  Service: General;   Laterality: N/A;    Family Psychiatric History: ASD- brother   Substance use- mom Alcohol- Dad  Family History:  Family History  Problem Relation Age of Onset   Heart disease Mother    Miscarriages / Stillbirths Sister    Asthma Sister    Stroke Sister        at birth   Developmental delay Brother     Social History:  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  Lived in a crack house at 50.   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 Social History   Socioeconomic History   Marital status: Single    Spouse name: Not on file   Number of children: Not on file   Years of education: Not on file   Highest education level: Not on file  Occupational History   Not on file  Tobacco Use   Smoking status: Every Day    Current packs/day: 0.25    Average packs/day: 0.3 packs/day for 10.0 years (2.5 ttl pk-yrs)    Types: Cigarettes    Passive exposure: Current   Smokeless tobacco: Never   Tobacco comments:    If given proper anxiety medication I would  Vaping Use   Vaping status: Never Used  Substance and Sexual Activity   Alcohol use: Not Currently    Comment: occ   Drug use: Yes    Frequency: 3.0 times per week    Types: Marijuana, Methamphetamines    Comment: 1/8 per day   Sexual activity: Not Currently    Partners: Female    Comment: fiance  Other Topics Concern   Not on file  Social History Narrative   Lives in [place]; works for hotel- at front desk-over night. <1/2 ppd; no alcohol. Fiance. No children; 2 cats and dog.    Social Drivers of Corporate investment banker Strain: Low Risk  (01/21/2023)   Received from [facility]   Overall Financial Resource Strain (CARDIA)    Difficulty of Paying Living Expenses: Not hard at all  Recent Concern: Financial Resource Strain - Medium Risk (10/29/2022)   Overall Financial Resource Strain (CARDIA)    Difficulty of Paying Living Expenses: Somewhat hard  Food Insecurity: Food Insecurity  Present (03/19/2023)   Hunger Vital Sign    Worried About Running Out of Food in the Last Year: Never true    Ran Out of Food in the Last Year: Often true  Transportation Needs: No Transportation Needs (03/19/2023)   PRAPARE - Administrator, Civil Service (Medical): No    Lack of Transportation (Non-Medical): No  Physical Activity: Inactive (10/29/2022)   Exercise Vital Sign    Days of Exercise per Week: 0 days    Minutes of Exercise  per Session: 0 min  Stress: Stress Concern Present (10/29/2022)   [employer] of Occupational Health - Occupational Stress Questionnaire    Feeling of Stress : Very much  Social Connections: Moderately Isolated (10/29/2022)   Social Connection and Isolation Panel    Frequency of Communication with Friends and Family: More than three times a week    Frequency of Social Gatherings with Friends and Family: More than three times a week    Attends Religious Services: Never    Database administrator or Organizations: No    Attends Banker Meetings: Never    Marital Status: Living with partner    Allergies:  Allergies  Allergen Reactions   Crab Extract Anaphylaxis and Swelling    Throat swelling  Reaction only to crab, no other shellfish   Dilaudid  [Hydromorphone ] Itching and Other (See Comments)    Skin tingling sensations    Seroquel [Quetiapine] Other (See Comments)    Skin tingling/crawling sensation    Current Medications: Current Outpatient Medications  Medication Sig Dispense Refill   acetaminophen  (TYLENOL ) 500 MG tablet Take 500 mg by mouth 2 (two) times daily as needed for moderate pain (pain score 4-6), headache or fever.     buPROPion  (WELLBUTRIN  XL) 300 MG 24 hr tablet Take 1 tablet (300 mg total) by mouth every morning. 30 tablet 1   colestipol  (COLESTID ) 1 g tablet Take 1 tablet (1 g total) by mouth 3 (three) times daily as needed (diarrhea/fecal urgency). Do not take within 2 hours of other medications.  (Patient not taking: Reported on 06/03/2023) 90 tablet 3   diphenoxylate -atropine  (LOMOTIL ) 2.5-0.025 MG tablet Take 1 tablet by mouth every 6 (six) hours as needed. 30 tablet 1   DULoxetine  HCl 40 MG CPEP Take 1 capsule (40 mg total) by mouth daily. 30 capsule 1   No current facility-administered medications for this visit.    ROS: Review of Systems  Constitutional:  Negative for diaphoresis, fatigue and unexpected weight change.  Gastrointestinal:  Negative for diarrhea.       Chronic  Genitourinary: Negative.   Musculoskeletal:  Negative for arthralgias.  Neurological:  Negative for dizziness, light-headedness and headaches.     Objective:  Psychiatric Specialty Exam: Blood pressure 124/72, pulse 67, height 5' 9 (1.753 m), weight 148 lb (67.1 kg).Body mass index is 21.86 kg/m.  General Appearance: Casual  Eye Contact:  Good  Speech:  Clear and Coherent and Normal Rate  Volume:  Normal  Mood:  Depressed  Affect:  Congruent, Full Range, and Tearful  Thought Content: WDL and Logical  Suicidal Thoughts:  No  Homicidal Thoughts:  No  Thought Process:  Coherent and Goal Directed  Orientation:  Full (Time, Place, and Person)    Memory: Immediate;   Good Recent;   Good Remote;   Good  Judgment:  Fair, improved  Insight:  Fair and Shallow, improved  Concentration:  Concentration: Good and Attention Span: Fair  Recall: not formally assessed   Fund of Knowledge: Good  Language: Good  Psychomotor Activity:  Normal  Akathisia:  No  AIMS (if indicated): not done  Assets:  Communication Skills Desire for Improvement Financial Resources/Insurance Leisure Time Resilience Social Support Talents/Skills Vocational/Educational  ADL's:  Intact  Cognition: WNL  Sleep:  Good   PE: General: well-appearing; no acute distress  Pulm: no increased work of breathing on room air  Strength & Muscle Tone: within normal limits Neuro: no focal neurological deficits observed Gait &  Station: normal  Metabolic Disorder Labs: Lab Results  Component Value Date   HGBA1C 5.1 03/21/2023   MPG 99.67 03/21/2023   No results found for: PROLACTIN Lab Results  Component Value Date   CHOL 133 03/21/2023   TRIG 65 03/21/2023   HDL 50 03/21/2023   CHOLHDL 2.7 03/21/2023   VLDL 13 03/21/2023   LDLCALC 70 03/21/2023   No results found for: TSH  Therapeutic Level Labs: No results found for: LITHIUM No results found for: VALPROATE No results found for: CBMZ  Screenings: GAD-7    Flowsheet Row Counselor from 10/29/2022 in [facility]  Total GAD-7 Score 20   PHQ2-9    Flowsheet Row Clinical Support from 10/14/2023 in [facility] Clinical Support from 08/12/2023 in [facility] ED from 03/19/2023 in [facility] Counselor from 10/29/2022 in [facility] Social Work from 04/29/2021 in [facility]  PHQ-2 Total Score 2 0 2 4 2   PHQ-9 Total Score -- -- 8 21 6    Flowsheet Row ED from 03/19/2023 in [facility] ED from 03/18/2023 in [facility] Emergency Department at [facility] Counselor from 10/29/2022 in [facility]  C-SSRS RISK CATEGORY High Risk Error: Q2 is Yes, you must answer 3, 4, and 5 Low Risk    Collaboration of Care: Collaboration of Care: Dr. Mercy  Patient/Guardian was advised Release of Information must be obtained prior to any record release in order to collaborate their care with an outside provider. Patient/Guardian was advised if they have not already done so to contact the registration department to sign all necessary forms in order for us  to release information regarding their care.   Consent: Patient/Guardian gives verbal consent for treatment and assignment of benefits for services  provided during this visit. Patient/Guardian expressed understanding and agreed to proceed.   Woody Kronberg, MD 07/22/2023 4:09 PM

## 2023-10-11 ENCOUNTER — Other Ambulatory Visit (HOSPITAL_BASED_OUTPATIENT_CLINIC_OR_DEPARTMENT_OTHER): Payer: Self-pay

## 2023-10-11 MED ORDER — AZITHROMYCIN 500 MG PO TABS
1000.0000 mg | ORAL_TABLET | Freq: Once | ORAL | 0 refills | Status: AC
  Filled 2023-10-11: qty 2, 1d supply, fill #0

## 2023-10-13 ENCOUNTER — Other Ambulatory Visit: Payer: Self-pay

## 2023-10-13 ENCOUNTER — Other Ambulatory Visit (HOSPITAL_BASED_OUTPATIENT_CLINIC_OR_DEPARTMENT_OTHER): Payer: Self-pay

## 2023-10-14 ENCOUNTER — Other Ambulatory Visit (HOSPITAL_BASED_OUTPATIENT_CLINIC_OR_DEPARTMENT_OTHER): Payer: Self-pay

## 2023-10-14 ENCOUNTER — Ambulatory Visit (INDEPENDENT_AMBULATORY_CARE_PROVIDER_SITE_OTHER): Payer: MEDICAID

## 2023-10-14 DIAGNOSIS — F902 Attention-deficit hyperactivity disorder, combined type: Secondary | ICD-10-CM

## 2023-10-14 DIAGNOSIS — F4312 Post-traumatic stress disorder, chronic: Secondary | ICD-10-CM

## 2023-10-14 MED ORDER — BUPROPION HCL ER (XL) 300 MG PO TB24
300.0000 mg | ORAL_TABLET | ORAL | 1 refills | Status: DC
  Filled 2023-10-14: qty 30, 30d supply, fill #0
  Filled 2023-11-08: qty 30, 30d supply, fill #1

## 2023-10-14 MED ORDER — DULOXETINE HCL 40 MG PO CPEP
40.0000 mg | ORAL_CAPSULE | Freq: Every day | ORAL | 1 refills | Status: DC
  Filled 2023-10-14 – 2023-11-08 (×2): qty 30, 30d supply, fill #0

## 2023-10-20 ENCOUNTER — Other Ambulatory Visit (HOSPITAL_BASED_OUTPATIENT_CLINIC_OR_DEPARTMENT_OTHER): Payer: Self-pay

## 2023-10-24 ENCOUNTER — Other Ambulatory Visit (HOSPITAL_BASED_OUTPATIENT_CLINIC_OR_DEPARTMENT_OTHER): Payer: Self-pay

## 2023-10-26 ENCOUNTER — Other Ambulatory Visit (HOSPITAL_COMMUNITY): Payer: Self-pay

## 2023-10-26 ENCOUNTER — Other Ambulatory Visit (HOSPITAL_BASED_OUTPATIENT_CLINIC_OR_DEPARTMENT_OTHER): Payer: Self-pay

## 2023-10-27 ENCOUNTER — Other Ambulatory Visit (HOSPITAL_BASED_OUTPATIENT_CLINIC_OR_DEPARTMENT_OTHER): Payer: Self-pay

## 2023-10-27 MED ORDER — DIPHENOXYLATE-ATROPINE 2.5-0.025 MG PO TABS
1.0000 | ORAL_TABLET | Freq: Three times a day (TID) | ORAL | 0 refills | Status: AC | PRN
  Filled 2023-10-27: qty 30, 10d supply, fill #0

## 2023-11-08 ENCOUNTER — Ambulatory Visit (HOSPITAL_COMMUNITY): Payer: MEDICAID | Admitting: Licensed Clinical Social Worker

## 2023-11-08 ENCOUNTER — Other Ambulatory Visit (HOSPITAL_BASED_OUTPATIENT_CLINIC_OR_DEPARTMENT_OTHER): Payer: Self-pay

## 2023-11-10 ENCOUNTER — Other Ambulatory Visit (HOSPITAL_BASED_OUTPATIENT_CLINIC_OR_DEPARTMENT_OTHER): Payer: Self-pay

## 2023-11-12 ENCOUNTER — Encounter (HOSPITAL_BASED_OUTPATIENT_CLINIC_OR_DEPARTMENT_OTHER): Payer: Self-pay

## 2023-11-12 ENCOUNTER — Other Ambulatory Visit (HOSPITAL_BASED_OUTPATIENT_CLINIC_OR_DEPARTMENT_OTHER): Payer: Self-pay

## 2023-11-15 ENCOUNTER — Other Ambulatory Visit (HOSPITAL_BASED_OUTPATIENT_CLINIC_OR_DEPARTMENT_OTHER): Payer: Self-pay

## 2023-11-24 ENCOUNTER — Other Ambulatory Visit (HOSPITAL_BASED_OUTPATIENT_CLINIC_OR_DEPARTMENT_OTHER): Payer: Self-pay

## 2023-11-29 ENCOUNTER — Ambulatory Visit (HOSPITAL_COMMUNITY): Payer: MEDICAID | Admitting: Licensed Clinical Social Worker

## 2023-11-30 ENCOUNTER — Ambulatory Visit (HOSPITAL_COMMUNITY): Admitting: Licensed Clinical Social Worker

## 2023-11-30 ENCOUNTER — Encounter (HOSPITAL_COMMUNITY): Payer: Self-pay
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
## 2023-12-02 ENCOUNTER — Other Ambulatory Visit (HOSPITAL_BASED_OUTPATIENT_CLINIC_OR_DEPARTMENT_OTHER): Payer: Self-pay

## 2023-12-02 ENCOUNTER — Ambulatory Visit (INDEPENDENT_AMBULATORY_CARE_PROVIDER_SITE_OTHER): Admitting: Psychiatry

## 2023-12-02 DIAGNOSIS — F4312 Post-traumatic stress disorder, chronic: Secondary | ICD-10-CM | POA: Diagnosis not present

## 2023-12-02 DIAGNOSIS — F902 Attention-deficit hyperactivity disorder, combined type: Secondary | ICD-10-CM

## 2023-12-02 DIAGNOSIS — F330 Major depressive disorder, recurrent, mild: Secondary | ICD-10-CM | POA: Diagnosis not present

## 2023-12-02 MED ORDER — DULOXETINE HCL 60 MG PO CPEP
60.0000 mg | ORAL_CAPSULE | Freq: Every day | ORAL | 1 refills | Status: AC
Start: 2023-12-02 — End: 2024-01-31
  Filled 2023-12-02: qty 30, 30d supply, fill #0
  Filled 2023-12-27: qty 30, 30d supply, fill #1

## 2023-12-02 MED ORDER — BUPROPION HCL ER (XL) 300 MG PO TB24
300.0000 mg | ORAL_TABLET | ORAL | 1 refills | Status: DC
  Filled 2023-12-02: qty 30, 30d supply, fill #0
  Filled 2023-12-27: qty 30, 30d supply, fill #1

## 2023-12-06 ENCOUNTER — Ambulatory Visit (INDEPENDENT_AMBULATORY_CARE_PROVIDER_SITE_OTHER): Admitting: Licensed Clinical Social Worker

## 2023-12-06 DIAGNOSIS — F902 Attention-deficit hyperactivity disorder, combined type: Secondary | ICD-10-CM

## 2023-12-06 DIAGNOSIS — F4325 Adjustment disorder with mixed disturbance of emotions and conduct: Secondary | ICD-10-CM | POA: Diagnosis not present

## 2023-12-06 DIAGNOSIS — F4312 Post-traumatic stress disorder, chronic: Secondary | ICD-10-CM
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
## 2023-12-15 ENCOUNTER — Ambulatory Visit: Payer: Self-pay | Admitting: Dietician

## 2023-12-22 ENCOUNTER — Encounter: Payer: Self-pay | Admitting: Dietician

## 2023-12-27 ENCOUNTER — Ambulatory Visit (INDEPENDENT_AMBULATORY_CARE_PROVIDER_SITE_OTHER): Admitting: Licensed Clinical Social Worker

## 2023-12-27 DIAGNOSIS — F4325 Adjustment disorder with mixed disturbance of emotions and conduct: Secondary | ICD-10-CM | POA: Diagnosis not present

## 2023-12-27 DIAGNOSIS — F902 Attention-deficit hyperactivity disorder, combined type: Secondary | ICD-10-CM | POA: Diagnosis not present

## 2023-12-27 DIAGNOSIS — F4312 Post-traumatic stress disorder, chronic: Secondary | ICD-10-CM | POA: Diagnosis not present

## 2023-12-27 NOTE — Progress Notes (Signed)
 THERAPIST PROGRESS NOTE  Virtual Visit via Video Note  I connected with Jeremiah Allen on 12/27/23 at  3:00 PM EST by a video enabled telemedicine application and verified that I am speaking with the correct person using two identifiers.  Location: Patient: [facility] ([place])  Provider: Provider Home    I discussed the limitations of evaluation and management by telemedicine and the availability of in person appointments. The patient expressed understanding and agreed to proceed.     I discussed the assessment and treatment plan with the patient. The patient was provided an opportunity to ask questions and all were answered. The patient agreed with the plan and demonstrated an understanding of the instructions.   The patient was advised to call back or seek an in-person evaluation if the symptoms worsen or if the condition fails to improve as anticipated.  I provided 60 minutes of non-face-to-face time during this encounter.   Juliene GORMAN Patee, LCSW   Participation Level: Active  Behavioral Response: CasualAlertAnxious and Depressed  Type of Therapy: Individual Therapy  Treatment Goals addressed:   ProgressTowards Goals: Progressing  Interventions: CBT, Motivational Interviewing, and Supportive  Summary: Jeremiah Allen is a 38 y.o. male who presents with  Suicidal/Homicidal: Nowithout intent/plan  Therapist Response:    S: Subjective Jeremiah Allen was alert and oriented 5. He was pleasant, cooperative, and maintained good eye contact. He engaged well in the therapy session and was dressed casually. He presented with an irritable and angry mood with an appropriate affect.  Patient reports "going through a lot." He expresses significant tension regarding the LCSW's transfer to the [place] office and inquired whether his insurance would be accepted there. After reviewing his coverage, it was explained that due to his Medicare and [facility] (secondary)  plan, he would need to remain at the [facility]. 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 Patient shared that he is already frustrated with his financial situation.  He reports ongoing stress related to his ex-girlfriend, who allowed his car to be repossessed after agreeing to make the payments. She is also still on the lease for their apartment and must sign a new lease in January. He states it is difficult to remain cordial with her; however, his current roommate and boyfriend strongly encourage maintaining the relationship to avoid losing the apartment.  Additional stressors include ongoing health issues and difficulty eating. He reports that he is advocating for himself and following up with gastroenterology. He also reports that he is losing his job with a [employer], stating the company is "going belly up" despite high profits. His last show is expected within the next 60 days.  O: Objective  Patient alert, oriented 5.  Pleasant, cooperative, good eye contact.  Casually dressed.  Mood: irritable and angry.  Affect: appropriate.  Engaged well throughout the session.  A: Assessment  Patient is experiencing increased psychosocial stressors, including financial strain, interpersonal conflict with his ex-girlfriend, housing concerns, employment instability, and ongoing health issues. These factors appear to be exacerbating mood dysregulation and tension. Patient demonstrates insight and is motivated to advocate for his healthcare needs. He appropriately expressed emotions during session and was receptive to therapeutic interventions.  P: Plan / Intervention  Provided psychoanalytic therapy to allow expression of thoughts, feelings, and emotions in a nonjudgmental environment.  Utilized economist.  Reviewed LCSW's transfer  plan; patient elected to  remain at [facility] and transition to a new therapist as advised.  Utilized supportive therapy for praise and encouragement.  Implemented cognitive behavioral therapy (CBT) strategies for behavioral activation and cognitive reframing.  Applied motivational interviewing techniques including reflective listening, open-ended questions, and positive affirmations.  Discussed mood regulation strategies and coping skills, including:  Temperature regulation (e.g., ice pack on neck)  Progressive muscle relaxation  Paced breathing  Intensive exercise  Plan: Return again in 4 weeks.  Diagnosis: Chronic post-traumatic stress disorder (PTSD)  Adjustment disorder with mixed disturbance of emotions and conduct  ADHD (attention deficit hyperactivity disorder), combined type  Collaboration of Care: Other patient to continue medication management and therapeutic services through [facility].  Patient/Guardian was advised Release of Information must be obtained prior to any record release in order to collaborate their care with an outside provider. Patient/Guardian was advised if they have not already done so to contact the registration department to sign all necessary forms in order for us  to release information regarding their care.   Consent: Patient/Guardian gives verbal consent for treatment and assignment of benefits for services provided during this visit. Patient/Guardian expressed understanding and agreed to proceed.   Juliene GORMAN Patee, LCSW 12/27/2023

## 2023-12-29 ENCOUNTER — Emergency Department (HOSPITAL_COMMUNITY)
Admission: EM | Admit: 2023-12-29 | Discharge: 2023-12-30 | Disposition: A | Discharge status: Home / Self Care | Attending: Emergency Medicine | Admitting: Emergency Medicine

## 2023-12-29 ENCOUNTER — Other Ambulatory Visit: Payer: Self-pay

## 2023-12-29 ENCOUNTER — Encounter (HOSPITAL_COMMUNITY): Payer: Self-pay | Admitting: Emergency Medicine

## 2023-12-29 ENCOUNTER — Emergency Department (HOSPITAL_COMMUNITY)

## 2023-12-29 DIAGNOSIS — R197 Diarrhea, unspecified: Secondary | ICD-10-CM | POA: Diagnosis not present

## 2023-12-29 DIAGNOSIS — F17200 Nicotine dependence, unspecified, uncomplicated: Secondary | ICD-10-CM | POA: Diagnosis not present

## 2023-12-29 DIAGNOSIS — R109 Unspecified abdominal pain: Secondary | ICD-10-CM

## 2023-12-29 DIAGNOSIS — R112 Nausea with vomiting, unspecified: Secondary | ICD-10-CM | POA: Diagnosis not present

## 2023-12-29 DIAGNOSIS — Z85048 Personal history of other malignant neoplasm of rectum, rectosigmoid junction, and anus: Secondary | ICD-10-CM | POA: Diagnosis not present

## 2023-12-29 DIAGNOSIS — R1084 Generalized abdominal pain: Secondary | ICD-10-CM | POA: Diagnosis not present

## 2023-12-29 DIAGNOSIS — R1013 Epigastric pain: Secondary | ICD-10-CM | POA: Diagnosis present

## 2023-12-29 LAB — COMPREHENSIVE METABOLIC PANEL WITH GFR
ALT: 16 U/L (ref 0–44)
AST: 28 U/L (ref 15–41)
Albumin: 4.6 g/dL (ref 3.5–5.0)
Alkaline Phosphatase: 83 U/L (ref 38–126)
Anion gap: 14 (ref 5–15)
BUN: 5 mg/dL — ABNORMAL LOW (ref 6–20)
CO2: 24 mmol/L (ref 22–32)
Calcium: 9.7 mg/dL (ref 8.9–10.3)
Chloride: 101 mmol/L (ref 98–111)
Creatinine, Ser: 1.23 mg/dL (ref 0.61–1.24)
GFR, Estimated: >60 mL/min (ref >60)
Glucose, Bld: 123 mg/dL — ABNORMAL HIGH (ref 70–99)
Potassium: 4 mmol/L (ref 3.5–5.1)
Sodium: 139 mmol/L (ref 135–145)
Total Bilirubin: 0.9 mg/dL (ref 0.0–1.2)
Total Protein: 7.8 g/dL (ref 6.5–8.1)

## 2023-12-29 LAB — CBC
HCT: 44.1 % (ref 39.0–52.0)
Hemoglobin: 14.8 g/dL (ref 13.0–17.0)
MCH: 31.6 pg (ref 26.0–34.0)
MCHC: 33.6 g/dL (ref 30.0–36.0)
MCV: 94 fL (ref 80.0–100.0)
Platelets: 261 K/uL (ref 150–400)
RBC: 4.69 MIL/uL (ref 4.22–5.81)
RDW: 12.6 % (ref 11.5–15.5)
WBC: 7.1 K/uL (ref 4.0–10.5)
nRBC: 0 % (ref 0.0–0.2)

## 2023-12-29 LAB — I-STAT CHEM 8, ED
BUN: 6 mg/dL (ref 6–20)
Calcium, Ion: 1.08 mmol/L — ABNORMAL LOW (ref 1.15–1.40)
Chloride: 104 mmol/L (ref 98–111)
Creatinine, Ser: 1.2 mg/dL (ref 0.61–1.24)
Glucose, Bld: 119 mg/dL — ABNORMAL HIGH (ref 70–99)
HCT: 45 % (ref 39.0–52.0)
Hemoglobin: 15.3 g/dL (ref 13.0–17.0)
Potassium: 4.2 mmol/L (ref 3.5–5.1)
Sodium: 139 mmol/L (ref 135–145)
TCO2: 22 mmol/L (ref 22–32)

## 2023-12-29 LAB — LIPASE, BLOOD: Lipase: 24 U/L (ref 11–51)

## 2023-12-29 MED ORDER — SODIUM CHLORIDE 0.9 % IV SOLN
12.5000 mg | Freq: Four times a day (QID) | INTRAVENOUS | Status: DC | PRN
  Filled 2023-12-29: qty 0.5

## 2023-12-29 MED ORDER — SODIUM CHLORIDE 0.9 % IV BOLUS
1000.0000 mL | Freq: Once | INTRAVENOUS | Status: AC
  Administered 2023-12-29: 1000 mL via INTRAVENOUS

## 2023-12-29 MED ORDER — ONDANSETRON 4 MG PO TBDP
4.0000 mg | ORAL_TABLET | Freq: Once | ORAL | Status: DC | PRN
  Filled 2023-12-29: qty 1

## 2023-12-29 MED ORDER — SODIUM CHLORIDE 0.9 % IV SOLN
8.0000 mg | Freq: Once | INTRAVENOUS | Status: AC
  Administered 2023-12-30: 8 mg via INTRAVENOUS
  Filled 2023-12-29: qty 4

## 2023-12-29 MED ORDER — MORPHINE SULFATE (PF) 4 MG/ML IV SOLN
4.0000 mg | Freq: Once | INTRAVENOUS | Status: AC
  Administered 2023-12-29: 4 mg via INTRAVENOUS
  Filled 2023-12-29: qty 1

## 2023-12-29 NOTE — ED Notes (Signed)
Pt cannot urinate at this time

## 2023-12-29 NOTE — ED Provider Notes (Incomplete)
 [place] [facility] Provider Note   CSN: [id number] Arrival date & time: 12/29/23  2144     Patient presents with: Abdominal Pain   Jeremiah Allen is a 38 y.o. male.  Patient with past medical history significant for rectal cancer with liver metastases in remission, cannabis use disorder, SBO, reversal of ileostomy presents to the emergency room complaining of severe abdominal pain, worse in the epigastric region, with multiple episodes of emesis.  He states he has chronic frequent episodes of diarrhea at baseline.  His last bowel movement was at noon.  His abdominal pain, nausea, vomiting began at the same time.  He states this feels similar to his last bowel obstruction.  The patient took Zofran  at home with no relief of symptoms.  He denies shortness of breath, chest pain, fever, urinary symptoms.  {Add pertinent medical, surgical, social history, OB history to HPI:32947}  Abdominal Pain      Prior to Admission medications   Medication Sig Start Date End Date Taking? Authorizing Provider  acetaminophen  (TYLENOL ) 500 MG tablet Take 500 mg by mouth 2 (two) times daily as needed for moderate pain (pain score 4-6), headache or fever.    [provider]  buPROPion  (WELLBUTRIN  XL) 300 MG 24 hr tablet Take 1 tablet (300 mg total) by mouth every morning. 12/02/23 01/31/24  Chien, Stephanie, MD  colestipol  (COLESTID ) 1 g tablet Take 1 tablet (1 g total) by mouth 3 (three) times daily as needed (diarrhea/fecal urgency). Do not take within 2 hours of other medications. Patient not taking: Reported on 06/03/2023 03/08/23     diphenoxylate -atropine  (LOMOTIL ) 2.5-0.025 MG tablet Take 1 tablet by mouth 3 (three) times daily as needed for diarrhea. 10/27/23     DULoxetine  (CYMBALTA ) 60 MG capsule Take 1 capsule (60 mg total) by mouth daily. 12/02/23 01/31/24  Chien, Stephanie, MD    Allergies: Crab extract, Dilaudid  [hydromorphone ], and Seroquel  [quetiapine]    Review of Systems  Gastrointestinal:  Positive for abdominal pain.    Updated Vital Signs BP 123/73 (BP Location: Left Arm)   Pulse 77   Temp 97.8 F (36.6 C)   Resp (!) 22   Ht 5' 9 (1.753 m)   Wt 68 kg   SpO2 100%   BMI 22.14 kg/m   Physical Exam Vitals and nursing note reviewed.  Constitutional:      General: He is not in acute distress.    Appearance: He is well-developed.  HENT:     Head: Normocephalic and atraumatic.  Eyes:     Conjunctiva/sclera: Conjunctivae normal.  Cardiovascular:     Rate and Rhythm: Normal rate and regular rhythm.  Pulmonary:     Effort: Pulmonary effort is normal. No respiratory distress.     Breath sounds: Normal breath sounds.  Abdominal:     General: A surgical scar is present.     Palpations: Abdomen is soft.     Tenderness: There is generalized abdominal tenderness.  Musculoskeletal:        General: No swelling.     Cervical back: Neck supple.  Skin:    General: Skin is warm and dry.     Capillary Refill: Capillary refill takes less than 2 seconds.  Neurological:     Mental Status: He is alert.  Psychiatric:        Mood and Affect: Mood normal.     (all labs ordered are listed, but only abnormal results are displayed) Labs Reviewed  CBC  LIPASE, BLOOD  COMPREHENSIVE METABOLIC PANEL WITH GFR  URINALYSIS, ROUTINE W REFLEX MICROSCOPIC  I-STAT CHEM 8, ED    EKG: None  Radiology: No results found.  {Document cardiac monitor, telemetry assessment procedure when appropriate:32947} Procedures   Medications Ordered in the ED  morphine  (PF) 4 MG/ML injection 4 mg (has no administration in time range)  ondansetron  (ZOFRAN ) 8 mg in sodium chloride  0.9 % 50 mL IVPB (has no administration in time range)  sodium chloride  0.9 % bolus 1,000 mL (has no administration in time range)  promethazine  (PHENERGAN ) 12.5 mg in sodium chloride  0.9 % 50 mL IVPB (has no administration in time range)      {Click here for  ABCD2, HEART and other calculators REFRESH Note before signing:1}                              Medical Decision Making Amount and/or Complexity of Data Reviewed Labs: ordered. Radiology: ordered.  Risk Prescription drug management.   This patient presents to the ED for concern of abdominal pain with nausea and vomiting, this involves an extensive number of treatment options, and is a complaint that carries with it a high risk of complications and morbidity.  The differential diagnosis includes SBO, cholecystitis, appendicitis, colitis, gastritis, gastroenteritis, others   Co morbidities / Chronic conditions that complicate the patient evaluation  As noted in HPI   Additional history obtained:  Additional history obtained from EMR External records from outside source obtained and reviewed including gastroenterology notes from September noting patient with postoperative diarrhea and fecal incontinence   Lab Tests:  I Ordered, and personally interpreted labs.  The pertinent results include:  ***   Imaging Studies ordered:  I ordered imaging studies including ***  I independently visualized and interpreted imaging which showed *** I agree with the radiologist interpretation   Cardiac Monitoring: / EKG:  The patient was maintained on a cardiac monitor.  I personally viewed and interpreted the cardiac monitored which showed an underlying rhythm of: ***   Problem List / ED Course / Critical interventions / Medication management  *** I ordered medication including ***   Reevaluation of the patient after these medicines showed that the patient *** I have reviewed the patients home medicines and have made adjustments as needed   Consultations Obtained:  I requested consultation with the ***,  and discussed lab and imaging findings as well as pertinent plan - they recommend: ***   Social Determinants of Health:  ***   Test / Admission -  Considered:  ***   {Document critical care time when appropriate  Document review of labs and clinical decision tools ie CHADS2VASC2, etc  Document your independent review of radiology images and any outside records  Document your discussion with family members, caretakers and with consultants  Document social determinants of health affecting pt's care  Document your decision making why or why not admission, treatments were needed:32947:::1}   Final diagnoses:  None    ED Discharge Orders     None

## 2023-12-29 NOTE — ED Provider Notes (Signed)
 [place] [facility] Provider Note   CSN: [id number] Arrival date & time: 12/29/23  2144     Patient presents with: Abdominal Pain   Jeremiah Allen is a 38 y.o. male.  Patient with past medical history significant for rectal cancer with liver metastases in remission, cannabis use disorder, SBO, reversal of ileostomy presents to the emergency room complaining of severe abdominal pain, worse in the epigastric region, with multiple episodes of emesis.  He states he has chronic frequent episodes of diarrhea at baseline.  His last bowel movement was at noon.  His abdominal pain, nausea, vomiting began at the same time.  He states this feels similar to his last bowel obstruction.  The patient took Zofran  at home with no relief of symptoms.  He denies shortness of breath, chest pain, fever, urinary symptoms.    Abdominal Pain      Prior to Admission medications   Medication Sig Start Date End Date Taking? Authorizing Provider  promethazine  (PHENERGAN ) 25 MG tablet Take 1 tablet (25 mg total) by mouth every 6 (six) hours as needed for nausea or vomiting. 12/30/23  Yes Logan Ubaldo NOVAK, PA-C  acetaminophen  (TYLENOL ) 500 MG tablet Take 500 mg by mouth 2 (two) times daily as needed for moderate pain (pain score 4-6), headache or fever.    [provider]  buPROPion  (WELLBUTRIN  XL) 300 MG 24 hr tablet Take 1 tablet (300 mg total) by mouth every morning. 12/02/23 01/31/24  Chien, Stephanie, MD  colestipol  (COLESTID ) 1 g tablet Take 1 tablet (1 g total) by mouth 3 (three) times daily as needed (diarrhea/fecal urgency). Do not take within 2 hours of other medications. Patient not taking: Reported on 06/03/2023 03/08/23     diphenoxylate -atropine  (LOMOTIL ) 2.5-0.025 MG tablet Take 1 tablet by mouth 3 (three) times daily as needed for diarrhea. 10/27/23     DULoxetine  (CYMBALTA ) 60 MG capsule Take 1 capsule (60 mg total) by mouth daily. 12/02/23 01/31/24   Graham Krabbe, MD    Allergies: Crab extract, Dilaudid  [place] ], and Seroquel [quetiapine]    Review of Systems  Gastrointestinal:  Positive for abdominal pain.    Updated Vital Signs BP 123/73 (BP Location: Left Arm)   Pulse 77   Temp 97.8 F (36.6 C)   Resp (!) 22   Ht 5' 9 (1.753 m)   Wt 68 kg   SpO2 100%   BMI 22.14 kg/m   Physical Exam Vitals and nursing note reviewed.  Constitutional:      General: He is not in acute distress.    Appearance: He is well-developed.  HENT:     Head: Normocephalic and atraumatic.  Eyes:     Conjunctiva/sclera: Conjunctivae normal.  Cardiovascular:     Rate and Rhythm: Normal rate and regular rhythm.  Pulmonary:     Effort: Pulmonary effort is normal. No respiratory distress.     Breath sounds: Normal breath sounds.  Abdominal:     General: A surgical scar is present.     Palpations: Abdomen is soft.     Tenderness: There is generalized abdominal tenderness.  Musculoskeletal:        General: No swelling.     Cervical back: Neck supple.  Skin:    General: Skin is warm and dry.     Capillary Refill: Capillary refill takes less than 2 seconds.  Neurological:     Mental Status: He is alert.  Psychiatric:        Mood  and Affect: Mood normal.     (all labs ordered are listed, but only abnormal results are displayed) Labs Reviewed  COMPREHENSIVE METABOLIC PANEL WITH GFR - Abnormal; Notable for the following components:      Result Value   Glucose, Bld 123 (*)    BUN 5 (*)    All other components within normal limits  I-STAT CHEM 8, ED - Abnormal; Notable for the following components:   Glucose, Bld 119 (*)    Calcium , Ion 1.08 (*)    All other components within normal limits  LIPASE, BLOOD  CBC  URINALYSIS, ROUTINE W REFLEX MICROSCOPIC    EKG: None  Radiology: CT ABDOMEN PELVIS W CONTRAST Result Date: 12/30/2023 EXAM: CT ABDOMEN AND PELVIS WITH CONTRAST 12/30/2023 12:14:18 AM TECHNIQUE: CT of the abdomen  and pelvis was performed with the administration of 75 mL of iohexol  (OMNIPAQUE ) 350 MG/ML injection. Multiplanar reformatted images are provided for review. Automated exposure control, iterative reconstruction, and/or weight-based adjustment of the mA/kV was utilized to reduce the radiation dose to as low as reasonably achievable. COMPARISON: 07/02/2023 CLINICAL HISTORY: Abdominal patient vomiting for 5 hours. FINDINGS: LOWER CHEST: Lung bases are free of acute infiltrate or sizable effusion. LIVER: Stable hepatic resection sites are again seen primarily within the right lobe of the liver, stable from the prior study. No new metastatic disease is seen. GALLBLADDER AND BILE DUCTS: The gallbladder is within normal limits. No biliary ductal dilatation. SPLEEN: The spleen is within normal limits. PANCREAS: The pancreas is within normal limits. ADRENAL GLANDS: The adrenal glands are unremarkable. KIDNEYS, URETERS AND BLADDER: The kidneys demonstrate a normal enhancement pattern. No calculi are seen in the kidneys or ureters. No hydronephrosis. The bladder is within normal limits. GI AND BOWEL: Stomach is within normal limits. Postsurgical changes in the small bowel are noted in the right lower quadrant. Small bowel shows no obstructive change. Changes are noted in the rectum consistent with the given clinical history of low anterior resection. Persistent retrorectal/presacral fluid collection is noted stable from the prior exam. No obstructive or inflammatory changes of the colon are seen. The appendix is within normal limits. PERITONEUM AND RETROPERITONEUM: Persistent retrorectal/presacral fluid collection is noted stable from the prior exam. No free air. VASCULATURE: Aorta is normal in caliber. LYMPH NODES: No lymphadenopathy. REPRODUCTIVE ORGANS: The prostate is within normal limits. BONES AND SOFT TISSUES: No bony abnormality is noted. No focal soft tissue abnormality. IMPRESSION: 1. No evidence of bowel  obstruction. 2. Stable postoperative changes including hepatic resection sites and low anterior resection with stable presacral fluid collection. 3. No new metastatic disease. Electronically signed by: Oneil Devonshire MD 12/30/2023 12:25 AM EST RP Workstation: [id number]     Procedures   Medications Ordered in the ED  promethazine  (PHENERGAN ) 12.5 mg in sodium chloride  0.9 % 50 mL IVPB (has no administration in time range)  morphine  (PF) 4 MG/ML injection 4 mg (4 mg Intravenous Given 12/29/23 2327)  ondansetron  (ZOFRAN ) 8 mg in sodium chloride  0.9 % 50 mL IVPB (0 mg Intravenous Stopped 12/30/23 0035)  sodium chloride  0.9 % bolus 1,000 mL (0 mLs Intravenous Stopped 12/30/23 0108)  iohexol  (OMNIPAQUE ) 350 MG/ML injection 75 mL (75 mLs Intravenous Contrast Given 12/30/23 0005)                                    Medical Decision Making Amount and/or Complexity of Data Reviewed Labs: ordered. Radiology:  ordered.  Risk Prescription drug management.   This patient presents to the ED for concern of abdominal pain with nausea and vomiting, this involves an extensive number of treatment options, and is a complaint that carries with it a high risk of complications and morbidity.  The differential diagnosis includes SBO, cholecystitis, appendicitis, colitis, gastritis, gastroenteritis, others   Co morbidities / Chronic conditions that complicate the patient evaluation  As noted in HPI   Additional history obtained:  Additional history obtained from EMR External records from outside source obtained and reviewed including gastroenterology notes from September noting patient with postoperative diarrhea and fecal incontinence   Lab Tests:  I Ordered, and personally interpreted labs.  The pertinent results include: No leukocytosis, creatinine 1.23   Imaging Studies ordered:  I ordered imaging studies including CT abdomen pelvis with contrast I independently visualized and interpreted  imaging which showed  1. No evidence of bowel obstruction.  2. Stable postoperative changes including hepatic resection sites and low  anterior resection with stable presacral fluid collection.  3. No new metastatic disease.   I agree with the radiologist interpretation    Problem List / ED Course / Critical interventions / Medication management   I ordered medication including Zofran , Phenergan , morphine , saline bolus Reevaluation of the patient after these medicines showed that the patient improved I have reviewed the patients home medicines and have made adjustments as needed   Social Determinants of Health:  Patient is a daily smoker   Test / Admission - Considered:  Patient with reassuring CT scan showed no bowel obstruction or other acute/surgical findings.  Labs reassuring with no significant acute abnormalities.  Suspect severe gastroenteritis.  Patient treated with Phenergan  with relief of symptoms.  He is drinking oral fluids without difficulty at this time.  Patient appears stable for discharge home.  Discussed Phenergan  prescription with patient and will send to pharmacy.  No indication for admission at this time.  Return precautions discussed.      Final diagnoses:  Abdominal pain, unspecified abdominal location  Nausea vomiting and diarrhea    ED Discharge Orders          Ordered    promethazine  (PHENERGAN ) 25 MG tablet  Every 6 hours PRN        12/30/23 0050               Logan Ubaldo NOVAK, PA-C 12/30/23 0146    Midge Golas, MD 12/30/23 [phone]

## 2023-12-29 NOTE — ED Triage Notes (Signed)
 Pt arrive POV c/o abd pain and vomiting for the past 5 hours.

## 2023-12-29 NOTE — ED Notes (Signed)
 Pt refuse to have Zofran . States he is been taking Zofran  all day with not relief.

## 2023-12-30 ENCOUNTER — Emergency Department (HOSPITAL_COMMUNITY)

## 2023-12-30 MED ORDER — IOHEXOL 350 MG/ML SOLN
75.0000 mL | Freq: Once | INTRAVENOUS | Status: AC | PRN
  Administered 2023-12-30: 75 mL via INTRAVENOUS

## 2023-12-30 MED ORDER — PROMETHAZINE HCL 25 MG PO TABS
25.0000 mg | ORAL_TABLET | Freq: Four times a day (QID) | ORAL | 0 refills | Status: AC | PRN
  Filled 2023-12-30: qty 30, 8d supply, fill #0

## 2023-12-30 NOTE — Discharge Instructions (Signed)
 Your workup this evening was reassuring.  I have prescribed Phenergan  to help with your nausea and vomiting.  Please take as directed.  Return to the emergency department if you develop any life-threatening symptoms.

## 2023-12-31 ENCOUNTER — Other Ambulatory Visit (HOSPITAL_BASED_OUTPATIENT_CLINIC_OR_DEPARTMENT_OTHER): Payer: Self-pay

## 2024-01-17 ENCOUNTER — Ambulatory Visit (INDEPENDENT_AMBULATORY_CARE_PROVIDER_SITE_OTHER)

## 2024-01-17 DIAGNOSIS — F902 Attention-deficit hyperactivity disorder, combined type: Secondary | ICD-10-CM | POA: Diagnosis not present

## 2024-01-17 DIAGNOSIS — F4312 Post-traumatic stress disorder, chronic: Secondary | ICD-10-CM | POA: Diagnosis not present

## 2024-01-17 DIAGNOSIS — F4325 Adjustment disorder with mixed disturbance of emotions and conduct: Secondary | ICD-10-CM | POA: Diagnosis not present

## 2024-01-17 NOTE — Progress Notes (Signed)
 THERAPIST PROGRESS NOTE Virtual Visit via Video Note  I connected with Jeremiah Allen on 01/17/2024 at  1:00 PM EST by a video enabled telemedicine application and verified that I am speaking with the correct person using two identifiers.  Location: Patient: Location on file Provider: Remote office   I discussed the limitations of evaluation and management by telemedicine and the availability of in person appointments. The patient expressed understanding and agreed to proceed.  I discussed the assessment and treatment plan with the patient. The patient was provided an opportunity to ask questions and all were answered. The patient agreed with the plan and demonstrated an understanding of the instructions.   The patient was advised to call back or seek an in-person evaluation if the symptoms worsen or if the condition fails to improve as anticipated.  I provided 60 minutes of non-face-to-face time during this encounter.   Tequila Rottmann L. Carsen Machi, LCSW   Session Time: 60 mins  Participation Level: Active  Behavioral Response: Neat and Well GroomedAlertEuthymic  Type of Therapy: Individual Therapy  Treatment Goals addressed:  Anxiety       LTG: Jeremiah Allen will score less than 5 on the Generalized Anxiety Disorder 7 Scale (GAD-7)  (Progressing)       Start:  10/29/22    Expected End:  03/03/24           STG: Jeremiah will participate in at least 80% of scheduled individual psychotherapy sessions  (Progressing)       Start:  10/29/22    Expected End:  03/03/24           STG: Jeremiah will complete at least 80% of assigned homework  (Progressing)       Start:  10/29/22    Expected End:  03/03/24           STG: Jeremiah will practice problem solving skills 3 times per week for the next 4 weeks.  (Progressing)       Start:  10/29/22    Expected End:  03/03/24           identify 3 triggers for anxiety  (Progressing)       Start:  10/29/22    Expected End:  03/03/24          ProgressTowards Goals: Progressing  Interventions: CBT, Motivational Interviewing, and Supportive  Summary: Jeremiah Allen is a 38 y.o. male who presents with a history of ADHD, experiencing heightened levels of anxiety and irritability. During the session, he discussed a recent incident where he reacted with significant anger, resulting in him punching his metal door and subsequently fracturing his hand. Jeremiah Allen received medical treatment for this injury and is currently using an arm brace.He described a longstanding pattern of explosive anger, indicating he has never directed his anger towards another person but has a tendency to strike inanimate objects. Jeremiah Allen demonstrates good insight into the triggers of his anger, stating, it happens so quickly that I don't think until after I have reacted. Additionally, he expressed ongoing stressors related to feeling uncomfortable leaving the house due to medical issues. Despite these challenges, Jeremiah Allen displayed resilience and demonstrated strength in his self-awareness, acknowledging patterns in his behavior that contribute to his emotional difficulties.  Suicidal/Homicidal: No without intent/plan  Therapist Response: LCSW incorporated CBT and MI to enhance his self-awareness and capitalize on his strengths. Together, we discussed various coping skills that Jeremiah Allen can employ to manage his anger and anxiety more effectively. We focused on recognizing early warning  signs that precede his reactivity, empowering him to intervene before escalating emotions lead to impulsive actions. Jeremiah Allen was engaged and alert throughout the session, actively participating in the discussion and demonstrating a willingness to implement these strategies. This collaborative process aimed to build his confidence in managing emotional responses constructively.  Plan: Return again in 4 weeks.  Diagnosis: Chronic post-traumatic stress disorder (PTSD)  ADHD (attention  deficit hyperactivity disorder), combined type  Adjustment disorder with mixed disturbance of emotions and conduct  Collaboration of Care: Other None  Patient/Guardian was advised Release of Information must be obtained prior to any record release in order to collaborate their care with an outside provider. Patient/Guardian was advised if they have not already done so to contact the registration department to sign all necessary forms in order for us  to release information regarding their care.   Consent: Patient/Guardian gives verbal consent for treatment and assignment of benefits for services provided during this visit. Patient/Guardian expressed understanding and agreed to proceed.   Jeremiah Allen 01/17/2024
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
## 2024-01-19 ENCOUNTER — Encounter: Payer: Self-pay | Admitting: Dietician

## 2024-01-20 ENCOUNTER — Ambulatory Visit (INDEPENDENT_AMBULATORY_CARE_PROVIDER_SITE_OTHER): Admitting: Psychiatry

## 2024-01-20 DIAGNOSIS — F330 Major depressive disorder, recurrent, mild: Secondary | ICD-10-CM | POA: Diagnosis not present

## 2024-01-20 DIAGNOSIS — F4312 Post-traumatic stress disorder, chronic: Secondary | ICD-10-CM | POA: Diagnosis not present

## 2024-01-20 DIAGNOSIS — F902 Attention-deficit hyperactivity disorder, combined type: Secondary | ICD-10-CM

## 2024-01-20 DIAGNOSIS — F419 Anxiety disorder, unspecified: Secondary | ICD-10-CM | POA: Diagnosis not present

## 2024-01-20 MED ORDER — DULOXETINE HCL 60 MG PO CPEP: 60.0000 mg | ORAL_CAPSULE | Freq: Every day | ORAL | 0 refills | Status: AC

## 2024-01-20 MED ORDER — BUPROPION HCL ER (XL) 300 MG PO TB24: 300.0000 mg | ORAL_TABLET | ORAL | 0 refills | Status: AC

## 2024-01-31 ENCOUNTER — Encounter (HOSPITAL_COMMUNITY): Payer: Self-pay | Admitting: Emergency Medicine

## 2024-01-31 ENCOUNTER — Other Ambulatory Visit: Payer: Self-pay

## 2024-01-31 ENCOUNTER — Emergency Department (HOSPITAL_COMMUNITY)
Admission: EM | Admit: 2024-01-31 | Discharge: 2024-01-31 | Discharge status: Against Medical Advice | Attending: Emergency Medicine | Admitting: Emergency Medicine

## 2024-01-31 DIAGNOSIS — R61 Generalized hyperhidrosis: Secondary | ICD-10-CM | POA: Insufficient documentation

## 2024-01-31 DIAGNOSIS — Z5321 Procedure and treatment not carried out due to patient leaving prior to being seen by health care provider: Secondary | ICD-10-CM | POA: Diagnosis not present

## 2024-01-31 DIAGNOSIS — R109 Unspecified abdominal pain: Secondary | ICD-10-CM | POA: Diagnosis not present

## 2024-01-31 DIAGNOSIS — Z85048 Personal history of other malignant neoplasm of rectum, rectosigmoid junction, and anus: Secondary | ICD-10-CM | POA: Diagnosis not present

## 2024-01-31 DIAGNOSIS — R110 Nausea: Secondary | ICD-10-CM | POA: Diagnosis present

## 2024-01-31 LAB — CBC WITH DIFFERENTIAL/PLATELET
Abs Immature Granulocytes: 0.07 K/uL (ref 0.00–0.07)
Basophils Absolute: 0 K/uL (ref 0.0–0.1)
Basophils Relative: 0 %
Eosinophils Absolute: 0 K/uL (ref 0.0–0.5)
Eosinophils Relative: 0 %
HCT: 44.9 % (ref 39.0–52.0)
Hemoglobin: 15.2 g/dL (ref 13.0–17.0)
Immature Granulocytes: 1 %
Lymphocytes Relative: 10 %
Lymphs Abs: 1.4 K/uL (ref 0.7–4.0)
MCH: 32.4 pg (ref 26.0–34.0)
MCHC: 33.9 g/dL (ref 30.0–36.0)
MCV: 95.7 fL (ref 80.0–100.0)
Monocytes Absolute: 0.5 K/uL (ref 0.1–1.0)
Monocytes Relative: 4 %
Neutro Abs: 11.8 K/uL — ABNORMAL HIGH (ref 1.7–7.7)
Neutrophils Relative %: 85 %
Platelets: 283 K/uL (ref 150–400)
RBC: 4.69 MIL/uL (ref 4.22–5.81)
RDW: 12.8 % (ref 11.5–15.5)
WBC: 13.9 K/uL — ABNORMAL HIGH (ref 4.0–10.5)
nRBC: 0 % (ref 0.0–0.2)

## 2024-01-31 LAB — COMPREHENSIVE METABOLIC PANEL WITH GFR
ALT: 14 U/L (ref 0–44)
AST: 26 U/L (ref 15–41)
Albumin: 4.9 g/dL (ref 3.5–5.0)
Alkaline Phosphatase: 101 U/L (ref 38–126)
Anion gap: 15 (ref 5–15)
BUN: 12 mg/dL (ref 6–20)
CO2: 23 mmol/L (ref 22–32)
Calcium: 9.8 mg/dL (ref 8.9–10.3)
Chloride: 97 mmol/L — ABNORMAL LOW (ref 98–111)
Creatinine, Ser: 1.03 mg/dL (ref 0.61–1.24)
GFR, Estimated: >60 mL/min
Glucose, Bld: 121 mg/dL — ABNORMAL HIGH (ref 70–99)
Potassium: 4.1 mmol/L (ref 3.5–5.1)
Sodium: 134 mmol/L — ABNORMAL LOW (ref 135–145)
Total Bilirubin: 0.4 mg/dL (ref 0.0–1.2)
Total Protein: 7.9 g/dL (ref 6.5–8.1)

## 2024-01-31 LAB — LIPASE, BLOOD: Lipase: 14 U/L (ref 11–51)

## 2024-01-31 MED ORDER — SODIUM CHLORIDE 0.9 % IV BOLUS
1000.0000 mL | Freq: Once | INTRAVENOUS | Status: AC
  Administered 2024-01-31: 1000 mL via INTRAVENOUS

## 2024-01-31 MED ORDER — SODIUM CHLORIDE 0.9 % IV SOLN
12.5000 mg | Freq: Once | INTRAVENOUS | Status: AC
  Administered 2024-01-31: 12.5 mg via INTRAVENOUS
  Filled 2024-01-31: qty 12.5

## 2024-01-31 MED ORDER — MORPHINE SULFATE (PF) 2 MG/ML IV SOLN
4.0000 mg | Freq: Once | INTRAVENOUS | Status: AC
  Administered 2024-01-31: 4 mg via INTRAVENOUS
  Filled 2024-01-31: qty 2

## 2024-01-31 MED ORDER — ONDANSETRON HCL 4 MG/2ML IJ SOLN: 4.0000 mg | Freq: Once | INTRAMUSCULAR | Status: DC

## 2024-01-31 NOTE — ED Provider Triage Note (Cosign Needed)
 Emergency Medicine Provider Triage Evaluation Note  Jeremiah Allen , a 38 y.o. male  was evaluated in triage.  Pt complains of nausea. Pt has hx of rectal cancer, SBO, cannabis misuse here with abd pain and nausea.  Sxs ongoing for the past several days.  No fever, diarrhea, or constipation.    Review of Systems  Positive: As above Negative: As above  Physical Exam  BP 131/80   Pulse (!) 57   SpO2 100%  Gen:   Awake, appears uncomfortable, actively vomiting Resp:  Normal effort  MSK:   Moves extremities without difficulty  Other:    Medical Decision Making  Medically screening exam initiated at 5:30 PM.  Appropriate orders placed.  Jeremiah Allen was informed that the remainder of the evaluation will be completed by another provider, this initial triage assessment does not replace that evaluation, and the importance of remaining in the ED until their evaluation is complete.  Last marijuana use was 2 hrs ago.     Jeremiah Colon, PA-C 01/31/24 1733

## 2024-01-31 NOTE — ED Triage Notes (Signed)
 PT presents with NV since 1300.  Appears Pale and diaphoretic, barely able to stand. 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  Hx of ostomy reversal.

## 2024-02-16 ENCOUNTER — Ambulatory Visit (HOSPITAL_COMMUNITY)

## 2024-02-16 ENCOUNTER — Encounter (HOSPITAL_COMMUNITY): Payer: Self-pay

## 2024-02-17 ENCOUNTER — Ambulatory Visit (HOSPITAL_COMMUNITY)

## 2024-02-17 DIAGNOSIS — F4312 Post-traumatic stress disorder, chronic: Secondary | ICD-10-CM

## 2024-02-17 DIAGNOSIS — F902 Attention-deficit hyperactivity disorder, combined type: Secondary | ICD-10-CM

## 2024-02-17 DIAGNOSIS — F419 Anxiety disorder, unspecified: Secondary | ICD-10-CM

## 2024-02-17 NOTE — Progress Notes (Unsigned)
 "  THERAPIST PROGRESS NOTE  Virtual Visit via Video Note  I connected with Jayson Rosebush Chipps on 02/17/24 at  2:00 PM EST by a video enabled telemedicine application and verified that I am speaking with the correct person using two identifiers.  Location: Patient: Location on file Provider: [facility] [place]   I discussed the limitations of evaluation and management by telemedicine and the availability of in person appointments. The patient expressed understanding and agreed to proceed.   I discussed the assessment and treatment plan with the patient. The patient was provided an opportunity to ask questions and all were answered. The patient agreed with the plan and demonstrated an understanding of the instructions.   The patient was advised to call back or seek an in-person evaluation if the symptoms worsen or if the condition fails to improve as anticipated.  I provided 60 minutes of non-face-to-face time during this encounter.   Herbie Lehrmann L. Wassim Kirksey, LCSW   Session Time: 2:02pm - 3:02pm  Participation Level: Active  Behavioral Response: Neat Alert Euthymic  Type of Therapy: Individual Therapy  Treatment Goals addressed:  Anxiety       LTG: Gurshan will score less than 5 on the Generalized Anxiety Disorder 7 Scale (GAD-7)  (Progressing)          STG: Kobi will participate in at least 80% of scheduled individual psychotherapy sessions  (Progressing)           STG: Crandall will complete at least 80% of assigned homework  (Progressing)           STG: Ivis will practice problem solving skills 3 times per week for the next 4 weeks.  (Progressing)          ProgressTowards Goals: Progressing  Interventions: CBT, Motivational Interviewing, and Strength-based  Summary: Sumedh Shinsato is a 39 y.o. male who presents with a diagnosis of ADHD, PTSD, and Anxiety and is currently in remission from stage 4 colon cancer. 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 [name] perceives that others have received special treatment in various situations, which leads him to believe that his cancer diagnosis should have granted him similar considerations. He lives with his partner, describing their relationship as supportive. However, he is in the process of separating from his ex-girlfriend, who holds the utilities in her name. [name] openly discussed the impact of his cancer diagnosis on his mental health. He has transitioned to a different medication that has significantly improved his anger and outbursts. He denies suicidal and homicidal ideations and exhibited alertness and engagement during the session..   Suicidal/Homicidal: Nowithout intent/plan  Therapist Response: the therapist employed [employer] Therapy (CBT) techniques to help [name] identify and challenge negative thought patterns related to his job loss and feelings of inadequacy. The therapist utilized Motivational Interviewing (MI) strategies, incorporating open-ended questions to encourage [name] to express his feelings and explore the underlying emotions tied to his cancer diagnosis and the perceived injustices he faces within the wrestling community. The therapist provided support and validation for Sams feelings, emphasizing the significance of his experiences and reinforcing that it is natural to feel overwhelmed during such challenging times. Through empathetic listening and reflection, the therapist fostered a safe space for [name] to process his emotions and develop coping strategies, facilitating a deeper understanding of his situation.  Plan: Return again in 1 weeks.  Diagnosis:   Attention deficit hyperactivity disorder (ADHD), combined type  Chronic post-traumatic stress disorder (PTSD)  Anxiety disorder, unspecified type  ADHD (attention deficit  hyperactivity disorder), combined type  Collaboration of Care: Other None  Patient/Guardian was advised Release of Information must be obtained prior to any record release in order to collaborate their care with an outside provider. Patient/Guardian was advised if they have not already done so to contact the registration department to sign all necessary forms in order for us  to release information regarding their care.   Consent: Patient/Guardian gives verbal consent for treatment and assignment of benefits for services provided during this visit. Patient/Guardian expressed understanding and agreed to proceed.   Amy Belloso L. Robynn, LCSW  02/17/2024  "

## 2024-02-21 ENCOUNTER — Encounter: Payer: Self-pay | Attending: Internal Medicine | Admitting: Dietician

## 2024-02-24 ENCOUNTER — Ambulatory Visit (HOSPITAL_COMMUNITY)

## 2024-02-24 DIAGNOSIS — F330 Major depressive disorder, recurrent, mild: Secondary | ICD-10-CM

## 2024-02-24 DIAGNOSIS — F4312 Post-traumatic stress disorder, chronic: Secondary | ICD-10-CM

## 2024-02-24 DIAGNOSIS — F411 Generalized anxiety disorder: Secondary | ICD-10-CM

## 2024-02-25 DIAGNOSIS — F411 Generalized anxiety disorder: Secondary | ICD-10-CM | POA: Insufficient documentation

## 2024-02-25 DIAGNOSIS — F330 Major depressive disorder, recurrent, mild: Secondary | ICD-10-CM | POA: Insufficient documentation

## 2024-02-25 NOTE — Progress Notes (Unsigned)
 "  THERAPIST PROGRESS NOTE  Virtual Visit via Video Note  I connected with Jeremiah Allen on 02/25/24 at  4:00 PM EST by a video enabled telemedicine application and verified that I am speaking with the correct person using two identifiers.  Location: Patient: Location on file Provider: Remote office   I discussed the limitations of evaluation and management by telemedicine and the availability of in person appointments. The patient expressed understanding and agreed to proceed.   I discussed the assessment and treatment plan with the patient. The patient was provided an opportunity to ask questions and all were answered. The patient agreed with the plan and demonstrated an understanding of the instructions.   The patient was advised to call back or seek an in-person evaluation if the symptoms worsen or if the condition fails to improve as anticipated.  I provided 61 minutes of non-face-to-face time during this encounter.   Hajra Port L. Ellajane Stong, LCSW   Session Time: 4:00pm - 5:01pm  Participation Level: Active  Behavioral Response: Neat Alert Euthymic  Type of Therapy: Individual Therapy  Treatment Goals addressed:   LTG: Jeremiah Allen will score less than 5 on the Generalized Anxiety Disorder 7 Scale (GAD-7)  (Progressing)          STG: Jeremiah Allen will participate in at least 80% of scheduled individual psychotherapy sessions  (Progressing)            STG: Jeremiah Allen will complete at least 80% of assigned homework  (Progressing)            STG: Jeremiah Allen will practice problem solving skills 3 times per week for the next 4 weeks.  (Progressing)       ProgressTowards Goals: Progressing  Interventions: CBT, Motivational Interviewing, and Supportive  Summary: Jeremiah Allen is a 39 y.o. male who presents with Chronic Post-Traumatic Stress Disorder (PTSD), Generalized Anxiety Disorder (GAD), and a Mild Episode of Recurrent Major Depressive Disorder. He reports heightened  anxiety due to the absence of a real ID, which is necessary for upcoming work-related travel. In response, this therapist has referred him for assistance in obtaining the ID and explored alternative payment options. 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 Through therapy, he processed these feelings, uncovering childhood memories that highlight wrestling as a significant, positive aspect of his life, which he feels he must always perfect. When wrestling disappoints him, he struggles to manage those emotions. Coping skills were discussed, emphasizing the acceptance of both positive and negative feelings towards his passions. Jeremiah Allen reports improvements in his mood swings, apart from this particular incident. Additionally, Jeremiah Allen and this therapist focused on processing past relationships and enhancing his active listening skills in his current relationship. Jeremiah Allen was engaged, alert throughout the session, and denies any suicidal or homicidal ideation..   Suicidal/Homicidal: No without intent/plan  Therapist Response: the therapist employed a variety of therapeutic approaches, including Cognitive Behavioral Therapy (CBT) and Motivational Interviewing (MI), to support Jeremiah Allen's emotional processing. The therapist actively listened to Jeremiah Allen's concerns and frustrations, providing a safe space for him to express his feelings regarding his anxiety, wrestling memorabilia, and childhood memories. The therapist utilized strength-based techniques, praising Jeremiah Allen for his engagement and willingness to explore complex emotions. Through trauma processing methods, the therapist guided Jeremiah Allen in examining the connections between his current frustrations and his past experiences, emphasizing the importance of accepting both positive and negative emotions related to his passions. The therapist also worked collaboratively with Jeremiah Allen to  develop coping skills to manage his  feelings and enhance his active listening abilities in his current relationships, reinforcing his progress and resilience throughout the session.  Plan: Return again in 1 weeks.  Diagnosis:   Chronic post-traumatic stress disorder (PTSD)  GAD (generalized anxiety disorder)  Mild episode of recurrent major depressive disorder  Collaboration of Care: Other None  Patient/Guardian was advised Release of Information must be obtained prior to any record release in order to collaborate their care with an outside provider. Patient/Guardian was advised if they have not already done so to contact the registration department to sign all necessary forms in order for us  to release information regarding their care.   Consent: Patient/Guardian gives verbal consent for treatment and assignment of benefits for services provided during this visit. Patient/Guardian expressed understanding and agreed to proceed.   Telissa Palmisano L. Robynn, LCSW  02/25/2024  "

## 2024-03-02 ENCOUNTER — Ambulatory Visit (INDEPENDENT_AMBULATORY_CARE_PROVIDER_SITE_OTHER)

## 2024-03-02 DIAGNOSIS — F4312 Post-traumatic stress disorder, chronic: Secondary | ICD-10-CM | POA: Diagnosis not present

## 2024-03-02 DIAGNOSIS — F330 Major depressive disorder, recurrent, mild: Secondary | ICD-10-CM

## 2024-03-02 DIAGNOSIS — F902 Attention-deficit hyperactivity disorder, combined type: Secondary | ICD-10-CM

## 2024-03-02 DIAGNOSIS — F411 Generalized anxiety disorder: Secondary | ICD-10-CM

## 2024-03-03 NOTE — Progress Notes (Signed)
 "  THERAPIST PROGRESS NOTE  Virtual Visit via Video Note  I connected with Jeremiah Allen on 03/03/24 at  4:00 PM EST by a video enabled telemedicine application and verified that I am speaking with the correct person using two identifiers.  Location: Patient: Location on file Provider: Remote office   I discussed the limitations of evaluation and management by telemedicine and the availability of in person appointments. The patient expressed understanding and agreed to proceed.  I discussed the assessment and treatment plan with the patient. The patient was provided an opportunity to ask questions and all were answered. The patient agreed with the plan and demonstrated an understanding of the instructions.   The patient was advised to call back or seek an in-person evaluation if the symptoms worsen or if the condition fails to improve as anticipated.  I provided 60 minutes of non-face-to-face time during this encounter.  Emmaclaire Switala L. Haze Antillon, LCSW   Session Time: 4:04pm - 5:04pm  Participation Level: Active  Behavioral Response: Neat Alert Euthymic  Type of Therapy: Individual Therapy  Treatment Goals addressed:  LTG: Jeremiah Allen will score less than 5 on the Generalized Anxiety Disorder 7 Scale (GAD-7)  (Progressing)          STG: Jeremiah Allen will participate in at least 80% of scheduled individual psychotherapy sessions  (Progressing)            STG: Jeremiah Allen will complete at least 80% of assigned homework  (Progressing)            STG: Jeremiah Allen will practice problem solving skills 3 times per week for the next 4 weeks.  (Progressing)        ProgressTowards Goals: Progressing  Interventions: CBT, Motivational Interviewing, and Supportive  Summary: Jeremiah Allen is a 39 y.o. male who presents with  Chronic Post-Traumatic Stress Disorder (PTSD), Generalized Anxiety Disorder (GAD), and a Mild Episode of Recurrent Major Depressive Disorder. This week, he reported having  a good overall experience, though he did express feeling overwhelmed and stressed during a visit to [employer] with his partner. Initially, he perceived the experience as negative; however, after discussing it, the therapist highlighted the positive aspects of Jeremiah Allen's responses. Jeremiah Allen successfully utilized his coping skills, effectively communicated his feelings, and collaborated on a solution that helped him stay grounded, prompting the therapist to praise his efforts. Recognizing these achievements helped Jeremiah Allen see that the situation was not as detrimental as he had initially thought. Cognitive Behavioral Therapy (CBT) techniques were employed to address his negative thoughts. Additionally, the session included further processing of Jeremiah Allen's traumatic childhood, drawing connections between his childhood experiences and his current self. The therapist provided psychoeducation on the concept of the inner child and began to facilitate the healing process. Throughout the session, Jeremiah Allen was engaged, alert, and showed notable progress. Jeremiah Allen reports no thoughts of SI or HI.   Suicidal/Homicidal: Nowithout intent/plan  Therapist Response: This therapist engaged with Jeremiah Allen as he shared his overwhelming experience at [employer]. They used active listening to truly understand Sams feelings and offered validation, acknowledging that it was okay for him to feel stressed. This therapist also highlighted the positive steps Jeremiah Allen took, using praise to reinforce his efforts in managing the situation. By employing Cognitive Behavioral Therapy (CBT), this therapist worked with Jeremiah Allen to challenge his negative thoughts about the experience. They introduced concepts from psychoeducation, explaining the idea of the inner child and how it relates to Jeremiah Allen's current challenges, which sparked insights for him. Throughout the session, this  therapist maintained a supportive and approachable manner, creating a safe space for Jeremiah Allen to explore his feelings and  engage in the healing process.  Plan: Return again in 1 weeks.  Diagnosis:   Chronic post-traumatic stress disorder (PTSD)  GAD (generalized anxiety disorder)  Mild episode of recurrent major depressive disorder  Attention deficit hyperactivity disorder (ADHD), combined type  Collaboration of Care: Other None  Patient/Guardian was advised Release of Information must be obtained prior to any record release in order to collaborate their care with an outside provider. Patient/Guardian was advised if they have not already done so to contact the registration department to sign all necessary forms in order for us  to release information regarding their care.   Consent: Patient/Guardian gives verbal consent for treatment and assignment of benefits for services provided during this visit. Patient/Guardian expressed understanding and agreed to proceed.   Arvella Massingale L. Robynn, LCSW  03/03/2024  "

## 2024-03-07 ENCOUNTER — Ambulatory Visit (HOSPITAL_COMMUNITY)

## 2024-03-07 ENCOUNTER — Encounter (HOSPITAL_COMMUNITY): Payer: Self-pay

## 2024-03-09 ENCOUNTER — Ambulatory Visit (HOSPITAL_COMMUNITY)

## 2024-03-16 ENCOUNTER — Ambulatory Visit (HOSPITAL_COMMUNITY)

## 2024-03-21 ENCOUNTER — Encounter (HOSPITAL_COMMUNITY): Admitting: Psychiatry

## 2024-03-22 ENCOUNTER — Ambulatory Visit (HOSPITAL_COMMUNITY)

## 2024-03-29 ENCOUNTER — Ambulatory Visit (HOSPITAL_COMMUNITY)

## 2024-04-05 ENCOUNTER — Ambulatory Visit (HOSPITAL_COMMUNITY)
